# Patient Record
Sex: Female | Born: 1970 | Race: White | Hispanic: No | Marital: Single | Smoking: Never smoker
Health system: Southern US, Community
[De-identification: ages and names within clinical notes are randomized; demographics above are authoritative.]

## PROBLEM LIST (undated history)

## (undated) DIAGNOSIS — K635 Polyp of colon: Secondary | ICD-10-CM

## (undated) DIAGNOSIS — K5792 Diverticulitis of intestine, part unspecified, without perforation or abscess without bleeding: Secondary | ICD-10-CM

## (undated) DIAGNOSIS — K219 Gastro-esophageal reflux disease without esophagitis: Secondary | ICD-10-CM

## (undated) DIAGNOSIS — F419 Anxiety disorder, unspecified: Secondary | ICD-10-CM

## (undated) DIAGNOSIS — K824 Cholesterolosis of gallbladder: Secondary | ICD-10-CM

## (undated) DIAGNOSIS — T7840XA Allergy, unspecified, initial encounter: Secondary | ICD-10-CM

## (undated) DIAGNOSIS — F329 Major depressive disorder, single episode, unspecified: Secondary | ICD-10-CM

## (undated) DIAGNOSIS — D126 Benign neoplasm of colon, unspecified: Secondary | ICD-10-CM

## (undated) DIAGNOSIS — K50019 Crohn's disease of small intestine with unspecified complications: Secondary | ICD-10-CM

## (undated) DIAGNOSIS — K56609 Unspecified intestinal obstruction, unspecified as to partial versus complete obstruction: Secondary | ICD-10-CM

## (undated) DIAGNOSIS — D3501 Benign neoplasm of right adrenal gland: Secondary | ICD-10-CM

## (undated) DIAGNOSIS — F32A Depression, unspecified: Secondary | ICD-10-CM

## (undated) HISTORY — DX: Cholesterolosis of gallbladder: K82.4

## (undated) HISTORY — DX: Unspecified intestinal obstruction, unspecified as to partial versus complete obstruction: K56.609

## (undated) HISTORY — PX: COLONOSCOPY: SHX174

## (undated) HISTORY — DX: Benign neoplasm of colon, unspecified: D12.6

## (undated) HISTORY — DX: Anxiety disorder, unspecified: F41.9

## (undated) HISTORY — DX: Allergy, unspecified, initial encounter: T78.40XA

## (undated) HISTORY — PX: OTHER SURGICAL HISTORY: SHX169

## (undated) HISTORY — DX: Diverticulitis of intestine, part unspecified, without perforation or abscess without bleeding: K57.92

## (undated) HISTORY — DX: Gastro-esophageal reflux disease without esophagitis: K21.9

## (undated) HISTORY — DX: Benign neoplasm of right adrenal gland: D35.01

## (undated) HISTORY — DX: Major depressive disorder, single episode, unspecified: F32.9

## (undated) HISTORY — DX: Polyp of colon: K63.5

## (undated) HISTORY — PX: POLYPECTOMY: SHX149

## (undated) HISTORY — DX: Depression, unspecified: F32.A

---

## 2002-10-08 ENCOUNTER — Emergency Department (HOSPITAL_COMMUNITY): Admission: EM | Admit: 2002-10-08 | Discharge: 2002-10-08 | Payer: Self-pay | Admitting: Emergency Medicine

## 2007-03-09 ENCOUNTER — Emergency Department (HOSPITAL_COMMUNITY): Admission: EM | Admit: 2007-03-09 | Discharge: 2007-03-09 | Payer: Self-pay | Admitting: Emergency Medicine

## 2009-09-13 ENCOUNTER — Emergency Department (HOSPITAL_COMMUNITY): Admission: EM | Admit: 2009-09-13 | Discharge: 2009-09-13 | Payer: Self-pay | Admitting: Emergency Medicine

## 2009-09-13 ENCOUNTER — Emergency Department (HOSPITAL_COMMUNITY): Admission: EM | Admit: 2009-09-13 | Discharge: 2009-09-13 | Payer: Self-pay | Admitting: Family Medicine

## 2011-01-23 ENCOUNTER — Inpatient Hospital Stay (HOSPITAL_COMMUNITY)
Admission: EM | Admit: 2011-01-23 | Discharge: 2011-01-25 | DRG: 390 | Disposition: A | Discharge status: Home / Self Care | Payer: Self-pay | Attending: General Surgery | Admitting: General Surgery

## 2011-01-23 ENCOUNTER — Emergency Department (HOSPITAL_COMMUNITY): Payer: Self-pay

## 2011-01-23 DIAGNOSIS — E876 Hypokalemia: Secondary | ICD-10-CM | POA: Diagnosis present

## 2011-01-23 DIAGNOSIS — K56609 Unspecified intestinal obstruction, unspecified as to partial versus complete obstruction: Principal | ICD-10-CM | POA: Diagnosis present

## 2011-01-23 LAB — COMPREHENSIVE METABOLIC PANEL
ALT: 41 U/L — ABNORMAL HIGH (ref 0–35)
AST: 82 U/L — ABNORMAL HIGH (ref 0–37)
Chloride: 107 mEq/L (ref 96–112)
GFR calc Af Amer: >60 mL/min (ref >60)
GFR calc non Af Amer: >60 mL/min (ref >60)
Glucose, Bld: 109 mg/dL — ABNORMAL HIGH (ref 70–99)
Total Bilirubin: 0.6 mg/dL (ref 0.3–1.2)
Total Protein: 7.7 g/dL (ref 6.0–8.3)

## 2011-01-23 LAB — CBC
HCT: 39.4 % (ref 36.0–46.0)
Hemoglobin: 13 g/dL (ref 12.0–15.0)
MCH: 28.8 pg (ref 26.0–34.0)
MCHC: 33 g/dL (ref 30.0–36.0)
MCV: 87.4 fL (ref 78.0–100.0)

## 2011-01-23 LAB — URINE MICROSCOPIC-ADD ON

## 2011-01-23 LAB — DIFFERENTIAL
Basophils Relative: 0 % (ref 0–1)
Lymphs Abs: 1.4 10*3/uL (ref 0.7–4.0)
Monocytes Absolute: 0.7 10*3/uL (ref 0.1–1.0)
Monocytes Relative: 5 % (ref 3–12)
Neutrophils Relative %: 84 % — ABNORMAL HIGH (ref 43–77)

## 2011-01-23 LAB — LIPASE, BLOOD: Lipase: 29 U/L (ref 11–59)

## 2011-01-23 LAB — URINALYSIS, ROUTINE W REFLEX MICROSCOPIC
Bilirubin Urine: NEGATIVE
Specific Gravity, Urine: 1.034 — ABNORMAL HIGH (ref 1.005–1.030)
Urobilinogen, UA: 1 mg/dL (ref 0.0–1.0)

## 2011-01-23 MED ORDER — IOHEXOL 300 MG/ML  SOLN
100.0000 mL | Freq: Once | INTRAMUSCULAR | Status: AC | PRN
  Administered 2011-01-23: 80 mL via INTRAVENOUS

## 2011-01-24 ENCOUNTER — Inpatient Hospital Stay (HOSPITAL_COMMUNITY): Payer: Self-pay

## 2011-01-24 LAB — COMPREHENSIVE METABOLIC PANEL
ALT: 35 U/L (ref 0–35)
AST: 50 U/L — ABNORMAL HIGH (ref 0–37)
BUN: 1 mg/dL — ABNORMAL LOW (ref 6–23)
Calcium: 8.9 mg/dL (ref 8.4–10.5)
Chloride: 106 mEq/L (ref 96–112)
Creatinine, Ser: 0.74 mg/dL (ref 0.4–1.2)
GFR calc Af Amer: >60 mL/min (ref >60)
Total Protein: 6.8 g/dL (ref 6.0–8.3)

## 2011-01-24 LAB — CBC
MCH: 29.3 pg (ref 26.0–34.0)
RDW: 13.2 % (ref 11.5–15.5)
WBC: 8.4 10*3/uL (ref 4.0–10.5)

## 2011-01-25 LAB — BASIC METABOLIC PANEL
BUN: <1 mg/dL — ABNORMAL LOW (ref 6–23)
CO2: 25 mEq/L (ref 19–32)
Calcium: 8.7 mg/dL (ref 8.4–10.5)
Chloride: 106 mEq/L (ref 96–112)
Creatinine, Ser: 0.67 mg/dL (ref 0.4–1.2)
Potassium: 4.1 mEq/L (ref 3.5–5.1)
Sodium: 138 mEq/L (ref 135–145)

## 2011-02-06 NOTE — H&P (Signed)
Meghan Charles, Meghan Charles             ACCOUNT NO.:  [id number]  MEDICAL RECORD NO.:  [id number]           PATIENT TYPE:  E  LOCATION:  [facility]                         FACILITY:  [facility]  PHYSICIAN:  Leighton Ruff. Redmond Pulling, MD     DATE OF BIRTH:  1971-06-09  DATE OF ADMISSION:  01/23/2011 DATE OF DISCHARGE:                             HISTORY & PHYSICAL   CHIEF COMPLAINT:  Abdominal pain, nausea, and vomiting.  PRIMARY CARE PROVIDER:  None.  BRIEF HISTORY:  The patient is a 39 year old white female who was in her normal state of health until last Sunday when she had the onset of about 5 hours of nausea and vomiting.  It lasted all day.  She had a fever up to 102-103 range at that time.  She had abdominal pain up through Monday, but then her symptoms got better.  She did well up until this last Saturday, January 22, 2011.  She got sick again with some abdominal pain.  Her symptoms got better and she was good until yesterday, Sunday, January 23, 2011, at which time, she developed recurrent nausea and vomiting, abdominal pain, and ultimately came to the ER around 2 a.m. She said she had her last bowel movement yesterday.  She had some diarrhea with it.  She reports prior to that last week, her bowel movements were kind of long and stringy, not her normal consistency.  In the ER, she had a blood pressure of 159/100, heart rate was 102, temperature was 98, respiratory rate was 20, sats were 97% on room air at 2 a.m.  She subsequently had been treated with analgesia and antiemetics.  Currently, she is no longer having any vomiting or abdominal pain, is relatively improved until you do an exam. Workup in the ER includes a white count of 13.9, hemoglobin 31, hematocrit 39, platelets 347,000.  UA was normal.  Sodium is 142, potassium is 3.6, chloride is 107, CO2 is 24, BUN is 12, creatinine is 0.7, glucose 109.  SGOT 82, SGPT is 41, total bilirubin is 0.6, alk phos 57, lipase 29.  Urine pregnancy was  negative.  Abdominal ultrasound was normal except for a small gallbladder polyp.  CT of the abdomen and the pelvis shows partial small bowel obstruction with edematous mucosa of a loop of bowel in the midabdomen.  There is edematous mucosa with dilatation of the proximal small bowel.  There is a small amount of free fluid in the pelvis.  The liver, spleen, pancreas, and kidneys were normal.  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 There was no free air, no adenopathy.  We were called to see the patient and plan to admit her for a partial small bowel obstruction.  PAST MEDICAL HISTORY:  She had a kidney stone in November 2010.  No other medical problems.  PAST SURGICAL HISTORY:  None.  FAMILY HISTORY:  Mother is living in good health.  Father lives in [place], she does not know him well, he has a history of MI and alcohol use.  One step-brother who she thinks is in good health, no sisters.  SOCIAL HISTORY:  She is single.  She works as a Chief Operating Officer.  She has multiple places were she works.  Tobacco:  Never.  Alcohol:  About 4 drinks per week.  Drugs:  None.  REVIEW OF SYSTEMS:  CONSTITUTIONAL:  Fever positive last Sunday when this all started.  She had temperature, she said, that went up to 102- 103, none yesterday that she is aware of.  Weight is stable. CEREBROVASCULAR:  Negative.  PULMONARY:  Negative.  CARDIAC:  Negative. GI:  Positive for occasional GERD, nausea and vomiting, diarrhea, thin stools as described above.  Prior to last week, she had no problem with nausea, vomiting, diarrhea, constipation, or blood in her stool.  SKIN: No changes.  PSYCHIATRIC:  History of prior depression.  She was treated with Lexapro in the past, but is currently off.  LOWER EXTREMITIES:  No edema.  No claudication.  MUSCULOSKELETAL:  No problems with ambulation. No gait issues.  No joint issues.  MEDICATIONS:  She is on Depo-Provera injections for birth control.   She takes a multivitamin once daily.  ALLERGIES:  None.  PHYSICAL EXAMINATION:  GENERAL:  She is a well-nourished, well-developed white female, currently in no distress. VITAL SIGNS:  Her temperature currently is 97.9, heart rate is 88, blood pressure is 131/88, respiratory rate is 18, sats 96% on room air. HEENT:  Head, normocephalic.  Ears, nose, and throat within normal limits.  Normal mucosa.  Dentition normal. NECK:  Trachea is in the midline.  Thyroid is nonpalpable.  No bruits. No lymphadenopathy. RESPIRATORY:  Normal respiratory effort.  Clear to auscultation. CARDIAC:  Normal S1 and S2.  No murmurs.  Pulses are +2 and equal proximally and distally. CHEST:  Nontender. ABDOMEN:  She is slightly distended, tender in the midepigastric and lower abdomen palpation.  Bowel sounds are hyperactive.  There are no masses, abscesses, or hernia noted. GENITALIA:  Deferred. RECTAL:  Deferred. LYMPHADENOPATHY:  None palpated, axillary or femoral area. SKIN:  No changes noted. NEUROLOGIC:  Cranial nerves are intact.  No focal deficits. PSYCHIATRIC:  Normal affect, mood, and orientation.  IMPRESSION: 1. Partial small bowel obstruction with nausea, vomiting, abdominal     pain. 2. Questionable gastroenteritis last week.  No prior history of     abdominal problems or surgeries. 3. History of nephrolithiasis.  PLAN: 1. I will insert an NG small bowel rest. 2. IV hydration. 3. We will recheck her labs in a.m. with further workup and     evaluation as needed.    Lydia Guiles, P.A.   ______________________________ Leighton Ruff. Redmond Pulling, MD   WDJ/MEDQ  D:  01/23/2011  T:  01/23/2011  Job:  [id number]  Electronically Signed by Earnstine Regal P.A. on 01/30/2011 01:58:51 PM Electronically Signed by Greer Pickerel M.D. on 02/06/2011 08:29:36 AM

## 2011-03-24 LAB — POCT URINALYSIS DIP (DEVICE)
Glucose, UA: NEGATIVE mg/dL
Ketones, ur: NEGATIVE mg/dL
Nitrite: NEGATIVE
Protein, ur: 100 mg/dL — AB
pH: 6 (ref 5.0–8.0)

## 2011-03-24 LAB — DIFFERENTIAL
Basophils Absolute: 0.1 10*3/uL (ref 0.0–0.1)
Basophils Relative: 1 % (ref 0–1)
Eosinophils Relative: 1 % (ref 0–5)
Lymphocytes Relative: 19 % (ref 12–46)
Monocytes Absolute: 0.8 10*3/uL (ref 0.1–1.0)
Monocytes Relative: 8 % (ref 3–12)
Neutro Abs: 6.3 10*3/uL (ref 1.7–7.7)

## 2011-03-24 LAB — URINALYSIS, ROUTINE W REFLEX MICROSCOPIC

## 2011-03-24 LAB — URINE MICROSCOPIC-ADD ON

## 2011-03-24 LAB — BASIC METABOLIC PANEL
BUN: 16 mg/dL (ref 6–23)
CO2: 27 mEq/L (ref 19–32)
GFR calc non Af Amer: >60 mL/min (ref >60)

## 2011-03-24 LAB — CBC
MCV: 89.5 fL (ref 78.0–100.0)
Platelets: 353 10*3/uL (ref 150–400)
RDW: 13.5 % (ref 11.5–15.5)
WBC: 9 10*3/uL (ref 4.0–10.5)

## 2011-03-24 LAB — POCT PREGNANCY, URINE: Preg Test, Ur: NEGATIVE

## 2011-08-21 ENCOUNTER — Emergency Department (HOSPITAL_COMMUNITY): Payer: Self-pay

## 2011-08-21 ENCOUNTER — Encounter (HOSPITAL_COMMUNITY): Payer: Self-pay

## 2011-08-21 ENCOUNTER — Emergency Department (HOSPITAL_COMMUNITY)
Admission: EM | Admit: 2011-08-21 | Discharge: 2011-08-21 | Disposition: A | Discharge status: Home / Self Care | Payer: Self-pay | Attending: Emergency Medicine | Admitting: Emergency Medicine

## 2011-08-21 DIAGNOSIS — S92009A Unspecified fracture of unspecified calcaneus, initial encounter for closed fracture: Secondary | ICD-10-CM | POA: Insufficient documentation

## 2011-08-21 DIAGNOSIS — Y990 Civilian activity done for income or pay: Secondary | ICD-10-CM | POA: Insufficient documentation

## 2011-08-21 DIAGNOSIS — Y9269 Other specified industrial and construction area as the place of occurrence of the external cause: Secondary | ICD-10-CM | POA: Insufficient documentation

## 2011-08-21 DIAGNOSIS — M25579 Pain in unspecified ankle and joints of unspecified foot: Secondary | ICD-10-CM | POA: Insufficient documentation

## 2011-08-21 DIAGNOSIS — M7989 Other specified soft tissue disorders: Secondary | ICD-10-CM | POA: Insufficient documentation

## 2011-08-21 DIAGNOSIS — W010XXA Fall on same level from slipping, tripping and stumbling without subsequent striking against object, initial encounter: Secondary | ICD-10-CM | POA: Insufficient documentation

## 2011-08-22 NOTE — Consult Note (Signed)
  NAMEOTTILIA, PIPPENGER NO.:  [id number]  MEDICAL RECORD NO.:  [id number]  LOCATION:  [facility]                         FACILITY:  [facility]  PHYSICIAN:  Pietro Cassis. Alvan Dame, M.D.  DATE OF BIRTH:  Jul 27, 1971  DATE OF CONSULTATION:  08/21/2011 DATE OF DISCHARGE:  08/21/2011                                CONSULTATION   CHIEF COMPLAINT:  Right heel injury.  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  She had immediate onset of pain.  She placed it in ice initially and was subsequently brought to emergency room for evaluation.  Radiographs revealed a calcaneus fracture.  We were consulted.  A CT scan was ordered.  She reports pain mainly over the anterior dorsolateral aspect of the ankle with throbbing pain somewhat relieved with IV medications at this point.  No other injuries to report.  PAST MEDICAL HISTORY:  History of kidney stone, otherwise healthy.  CURRENT MEDICATIONS:  None.  ALLERGIES:  No known drug allergies.  SURGICAL HISTORY:  None pertinent.  SOCIAL HISTORY:  She denies smoking and drug use.  She does occasionally drink.  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  She does not have a primary care physician.  Apparently she lives in a third story apartment.  PHYSICAL EXAMINATION:  She was seen and evaluated in the emergency room. She was afebrile with stable vital signs, relatively comfortable at the time of evaluation.  She had already some bruising and swelling over the lateral aspect of the foot and ankle region.  She had palpable pulses. Intact sensorially.  She had no other obvious deformities.  No other upper extremity or lower extremity pathology.  RADIOGRAPHY:  Plain films of the right foot and ankle revealed comminuted calcaneus fracture, small avulsion off the distal fibula. CT scan was ordered confirming a comminuted calcaneus fracture with fracture  extending into the subtalar joint as well as the calcaneocuboid joints.  ASSESSMENT:  Right closed calcaneus fracture.  PLAN:  I reviewed with Jamee her current situation.  She is going to be placed in a posterior splint; will be nonweightbearing until further directed.  She will also be given a Cam walker boot through the emergency room.  I am going to review these films with Dr. Wylene Simmer and/or Dr. Altamese Loyal for surgical management versus nonsurgical management.  We did take down her cell number [phone] to contact her regarding followup, either with our office or with Dr. Marcelino Scot.  Again, we stressed nonweightbearing.  She was given Percocet through the emergency room as well as Toradol.  Questions were encouraged, answers reviewed with her.     Pietro Cassis Alvan Dame, M.D.     MDO/MEDQ  D:  08/21/2011  T:  08/21/2011  Job:  [id number]  Electronically Signed by Paralee Cancel M.D. on 08/22/2011 09:05:20 AM

## 2011-08-29 ENCOUNTER — Ambulatory Visit (HOSPITAL_COMMUNITY): Payer: Self-pay

## 2011-08-29 ENCOUNTER — Ambulatory Visit (HOSPITAL_BASED_OUTPATIENT_CLINIC_OR_DEPARTMENT_OTHER)
Admission: RE | Admit: 2011-08-29 | Discharge: 2011-08-30 | Disposition: A | Discharge status: Home / Self Care | Payer: Self-pay | Source: Ambulatory Visit | Attending: Orthopedic Surgery | Admitting: Orthopedic Surgery

## 2011-08-29 ENCOUNTER — Ambulatory Visit (HOSPITAL_COMMUNITY): Payer: Self-pay | Attending: Orthopedic Surgery

## 2011-08-29 DIAGNOSIS — X58XXXA Exposure to other specified factors, initial encounter: Secondary | ICD-10-CM | POA: Insufficient documentation

## 2011-08-29 DIAGNOSIS — Z01812 Encounter for preprocedural laboratory examination: Secondary | ICD-10-CM | POA: Insufficient documentation

## 2011-08-29 DIAGNOSIS — S92009A Unspecified fracture of unspecified calcaneus, initial encounter for closed fracture: Secondary | ICD-10-CM | POA: Insufficient documentation

## 2011-08-29 DIAGNOSIS — Y929 Unspecified place or not applicable: Secondary | ICD-10-CM | POA: Insufficient documentation

## 2011-08-29 LAB — POCT HEMOGLOBIN-HEMACUE: Hemoglobin: 12.5 g/dL (ref 12.0–15.0)

## 2011-08-31 NOTE — Op Note (Addendum)
NAMEJACQUITA, MULHEARN NO.:  [id number]  MEDICAL RECORD NO.:  [id number]  LOCATION:  [facility]                         FACILITY:  [facility]  PHYSICIAN:  Wylene Simmer, MD        DATE OF BIRTH:  1971/05/15  DATE OF PROCEDURE:  08/29/2011 DATE OF DISCHARGE:  08/21/2011                              OPERATIVE REPORT   PREOPERATIVE DIAGNOSIS:  Right calcaneus fracture.  POSTOPERATIVE DIAGNOSIS:  Right calcaneus fracture.  PROCEDURE: 1. Open reduction and internal fixation right calcaneus fracture. 2. Intraoperative interpretation of fluoroscopic imaging greater than     1 hour.  SURGEON:  Wylene Simmer, MD  ANESTHESIA:  General, regional.  IV FLUIDS:  See anesthesia record.  ESTIMATED BLOOD LOSS:  Minimal.  TOURNIQUET TIME:  1 hour and 45 minutes at 250 mmHg.  COMPLICATIONS:  None apparent.  DISPOSITION:  Extubated, awake and stable to recovery.  INDICATIONS FOR PROCEDURE:  The patient is a 40 year old female who injured her foot at work approximately 10 days ago.  This occurred when she jumped down from a bar landing hard on the floor.  A CT scan in the emergency department revealed a comminuted fracture of the calcaneus that was displaced.  She presents now for operative treatment of this injury.  She understands the risks and benefits of this procedure as well as the alternative treatment options.  Specifically, she understands the risks of bleeding, infection, nerve damage, blood clots, need for additional surgery, amputation and death.  PROCEDURE IN DETAIL:  After preoperative consent was obtained, the correct operative site was identified, the patient was brought to the operating room and placed supine on the operating table.  General anesthesia was induced.  Preoperative antibiotics were administered. Surgical time-out was taken.  The patient was then turned into the lateral decubitus position with the right side up.  The right lower extremity was then  prepped and draped in standard sterile fashion with tourniquet around the thigh.  A curvilinear incision was marked from the tip of the fibula to the base of the fourth metatarsal.  The extremity was exsanguinated and the tourniquet was inflated to 250 mmHg.  The curvilinear incision was made and sharp dissection was carried down through the skin.  Blunt dissection was carried down through the subcutaneous tissue to the peroneal tendon sheath.  The sheath was incised, peroneus longus and brevis tendons were retracted distally. The extensor digitorum brevis muscle was elevated exposing the anterior process of the calcaneus.  The sinus tarsi was dissected subperiosteally of all of its fat exposing the posterior facet.  The fracture line was immediately evident from the anterior portion of the calcaneus running longitudinally into the posterior facet.  The fracture was mobilized and irrigated of all hematoma and all nonviable small fracture fragments. There were several fragments anteriorly at the level of the calcaneocuboid joint.  The fracture was reduced at the posterior facet. A 0.0625 K-wire was inserted across the fracture line provisionally holding the reduction.  A 4-mm Schanz pin had been inserted into the calcaneal tuberosity percutaneously in order to help manipulate the tuberosity fragment.  A 3.5-mm fully-threaded lag screw was then inserted just below the subchondral bone of  the posterior facet.  This was noted to compress the fracture line appropriately.  An Acumed plate was then selected and placed adjacent to the posterior facet and onto the anterior process of the calcaneus.  It was provisionally pinned in place.  Lateral foot in Chilton views were obtained confirming appropriate position of the plate and appropriate reduction of the posterior facet fracture line.  The angle of Gissane was normal.  The Bohler angle was also normal.  The plate was then secured to the  bone with two bicortical screws just below the posterior facet and three screws at the anterior process of the calcaneus.  The most anterior and superior of the screws and plate was a unicortical locking screw.  The remainder were nonlocking bicortical screws.  Final AP foot, lateral foot Broden views and Harris heel views were obtained showing appropriate reduction of the fracture as well as appropriate position and length of all hardware.  The anterior process fragments were reduced and clamped with a tenaculum.  A percutaneous incision was made and a hole was drilled through the anterior process into the plantar surface of the calcaneus.  A 3.5-mm fully-threaded screw was inserted in percutaneous fashion down through this fragment.  The fragment, however was extremely comminuted and the screw would not gain purchase.  Screw was removed.  An attempt was made at fixation with suture and this also proved unsuccessful.  The fragments were quite small and comminuted, so these were excised and used as bone graft in the anterior process fracture line.  The wound was then irrigated copiously.  0 Vicryl inverted simple sutures were used to close the peroneal tendon sheath and the subcutaneous tissue over the sinus tarsi.  The skin was closed with a running suture of 3-0 Prolene and 2 stab incisions were also closed with simple sutures of 3-0 Prolene.  Sterile dressings were applied followed by well-padded short-leg cast.  Tourniquet was released at 1 hour and 45 minutes after application of the dressings.  The patient was then awakened from anesthesia and transported to recovery room in stable condition.  FOLLOWUP PLAN:  The patient will be nonweightbearing on the right lower extremity.  She will remain overnight for observation for pain control. She will follow up with me in 2 weeks for suture removal and application of a short-leg cast.     Wylene Simmer,  MD   ______________________________ Wylene Simmer, MD    JH/MEDQ  D:  08/29/2011  T:  08/30/2011  Job:  [id number]  Electronically Signed by Jenny Reichmann Makiyah Zentz  on 09/14/2011 12:11:44 PM

## 2011-11-07 ENCOUNTER — Other Ambulatory Visit (HOSPITAL_COMMUNITY): Payer: Self-pay | Admitting: Obstetrics & Gynecology

## 2011-11-07 DIAGNOSIS — Z1231 Encounter for screening mammogram for malignant neoplasm of breast: Secondary | ICD-10-CM

## 2011-12-07 ENCOUNTER — Ambulatory Visit (HOSPITAL_COMMUNITY): Payer: Self-pay | Attending: Obstetrics & Gynecology

## 2012-11-04 ENCOUNTER — Emergency Department (HOSPITAL_COMMUNITY): Payer: Self-pay

## 2012-11-04 ENCOUNTER — Encounter (HOSPITAL_COMMUNITY): Payer: Self-pay | Admitting: *Deleted

## 2012-11-04 ENCOUNTER — Inpatient Hospital Stay (HOSPITAL_COMMUNITY)
Admission: EM | Admit: 2012-11-04 | Discharge: 2012-11-06 | DRG: 392 | Disposition: A | Discharge status: Home / Self Care | Payer: MEDICAID | Attending: Internal Medicine | Admitting: Internal Medicine

## 2012-11-04 DIAGNOSIS — D500 Iron deficiency anemia secondary to blood loss (chronic): Secondary | ICD-10-CM

## 2012-11-04 DIAGNOSIS — R1110 Vomiting, unspecified: Secondary | ICD-10-CM

## 2012-11-04 DIAGNOSIS — D72829 Elevated white blood cell count, unspecified: Secondary | ICD-10-CM | POA: Diagnosis not present

## 2012-11-04 DIAGNOSIS — R110 Nausea: Secondary | ICD-10-CM

## 2012-11-04 DIAGNOSIS — D62 Acute posthemorrhagic anemia: Secondary | ICD-10-CM | POA: Diagnosis present

## 2012-11-04 DIAGNOSIS — R109 Unspecified abdominal pain: Secondary | ICD-10-CM

## 2012-11-04 DIAGNOSIS — K5090 Crohn's disease, unspecified, without complications: Secondary | ICD-10-CM | POA: Diagnosis present

## 2012-11-04 DIAGNOSIS — K56609 Unspecified intestinal obstruction, unspecified as to partial versus complete obstruction: Secondary | ICD-10-CM

## 2012-11-04 DIAGNOSIS — K5289 Other specified noninfective gastroenteritis and colitis: Principal | ICD-10-CM

## 2012-11-04 DIAGNOSIS — K529 Noninfective gastroenteritis and colitis, unspecified: Secondary | ICD-10-CM

## 2012-11-04 DIAGNOSIS — R112 Nausea with vomiting, unspecified: Secondary | ICD-10-CM

## 2012-11-04 LAB — URINE MICROSCOPIC-ADD ON

## 2012-11-04 LAB — COMPREHENSIVE METABOLIC PANEL
AST: 16 U/L (ref 0–37)
Albumin: 3.9 g/dL (ref 3.5–5.2)
Calcium: 9 mg/dL (ref 8.4–10.5)
Chloride: 101 mEq/L (ref 96–112)
Creatinine, Ser: 0.61 mg/dL (ref 0.50–1.10)
Total Bilirubin: 0.3 mg/dL (ref 0.3–1.2)
Total Protein: 7.3 g/dL (ref 6.0–8.3)

## 2012-11-04 LAB — CBC WITH DIFFERENTIAL/PLATELET
Basophils Absolute: 0 10*3/uL (ref 0.0–0.1)
Basophils Relative: 0 % (ref 0–1)
Eosinophils Absolute: 0 10*3/uL (ref 0.0–0.7)
HCT: 35.6 % — ABNORMAL LOW (ref 36.0–46.0)
MCH: 28.2 pg (ref 26.0–34.0)
MCHC: 33.7 g/dL (ref 30.0–36.0)
Monocytes Absolute: 0.2 10*3/uL (ref 0.1–1.0)
Neutro Abs: 12.2 10*3/uL — ABNORMAL HIGH (ref 1.7–7.7)
RDW: 13.6 % (ref 11.5–15.5)

## 2012-11-04 LAB — URINALYSIS, ROUTINE W REFLEX MICROSCOPIC
Glucose, UA: NEGATIVE mg/dL
Leukocytes, UA: NEGATIVE
Nitrite: NEGATIVE
Specific Gravity, Urine: 1.038 — ABNORMAL HIGH (ref 1.005–1.030)
pH: 6 (ref 5.0–8.0)

## 2012-11-04 LAB — PREGNANCY, URINE: Preg Test, Ur: NEGATIVE

## 2012-11-04 LAB — LIPASE, BLOOD: Lipase: 32 U/L (ref 11–59)

## 2012-11-04 MED ORDER — ONDANSETRON HCL 4 MG/2ML IJ SOLN
4.0000 mg | Freq: Once | INTRAMUSCULAR | Status: AC
  Administered 2012-11-04: 4 mg via INTRAVENOUS
  Filled 2012-11-04: qty 2

## 2012-11-04 MED ORDER — DIPHENHYDRAMINE HCL 50 MG/ML IJ SOLN
25.0000 mg | Freq: Once | INTRAMUSCULAR | Status: AC
  Administered 2012-11-04: 25 mg via INTRAVENOUS
  Filled 2012-11-04: qty 1

## 2012-11-04 MED ORDER — IOHEXOL 300 MG/ML  SOLN
100.0000 mL | Freq: Once | INTRAMUSCULAR | Status: AC | PRN
  Administered 2012-11-04: 100 mL via INTRAVENOUS

## 2012-11-04 MED ORDER — ONDANSETRON HCL 4 MG PO TABS: 4.0000 mg | ORAL_TABLET | Freq: Four times a day (QID) | ORAL | Status: DC | PRN

## 2012-11-04 MED ORDER — ENOXAPARIN SODIUM 40 MG/0.4ML ~~LOC~~ SOLN
40.0000 mg | SUBCUTANEOUS | Status: DC
  Administered 2012-11-04 – 2012-11-06 (×3): 40 mg via SUBCUTANEOUS
  Filled 2012-11-04 (×3): qty 0.4

## 2012-11-04 MED ORDER — ONDANSETRON HCL 4 MG/2ML IJ SOLN
4.0000 mg | Freq: Four times a day (QID) | INTRAMUSCULAR | Status: DC | PRN
  Administered 2012-11-04 – 2012-11-05 (×4): 4 mg via INTRAVENOUS
  Filled 2012-11-04 (×3): qty 2

## 2012-11-04 MED ORDER — ONDANSETRON HCL 4 MG/2ML IJ SOLN
INTRAMUSCULAR | Status: AC
  Filled 2012-11-04: qty 2

## 2012-11-04 MED ORDER — PIPERACILLIN-TAZOBACTAM 3.375 G IVPB
3.3750 g | Freq: Three times a day (TID) | INTRAVENOUS | Status: DC
  Administered 2012-11-04 – 2012-11-05 (×3): 3.375 g via INTRAVENOUS
  Filled 2012-11-04 (×4): qty 50

## 2012-11-04 MED ORDER — ACETAMINOPHEN 650 MG RE SUPP: 650.0000 mg | Freq: Four times a day (QID) | RECTAL | Status: DC | PRN

## 2012-11-04 MED ORDER — HYDROMORPHONE HCL PF 1 MG/ML IJ SOLN
1.0000 mg | Freq: Once | INTRAMUSCULAR | Status: AC
  Administered 2012-11-04: 1 mg via INTRAVENOUS
  Filled 2012-11-04: qty 1

## 2012-11-04 MED ORDER — HYDROMORPHONE HCL PF 1 MG/ML IJ SOLN
INTRAMUSCULAR | Status: AC
  Filled 2012-11-04: qty 1

## 2012-11-04 MED ORDER — HYDROMORPHONE HCL PF 1 MG/ML IJ SOLN
0.5000 mg | INTRAMUSCULAR | Status: DC | PRN
  Administered 2012-11-04 – 2012-11-05 (×5): 0.5 mg via INTRAVENOUS
  Filled 2012-11-04 (×4): qty 1

## 2012-11-04 MED ORDER — SODIUM CHLORIDE 0.9 % IV BOLUS (SEPSIS)
1000.0000 mL | Freq: Once | INTRAVENOUS | Status: AC
  Administered 2012-11-04: 1000 mL via INTRAVENOUS

## 2012-11-04 MED ORDER — SODIUM CHLORIDE 0.9 % IV SOLN
INTRAVENOUS | Status: DC
  Administered 2012-11-04 – 2012-11-05 (×4): via INTRAVENOUS

## 2012-11-04 MED ORDER — ACETAMINOPHEN 325 MG PO TABS
650.0000 mg | ORAL_TABLET | Freq: Four times a day (QID) | ORAL | Status: DC | PRN
  Administered 2012-11-05: 650 mg via ORAL
  Filled 2012-11-04: qty 2

## 2012-11-04 MED ORDER — MORPHINE SULFATE 4 MG/ML IJ SOLN
6.0000 mg | Freq: Once | INTRAMUSCULAR | Status: AC
  Administered 2012-11-04: 6 mg via INTRAVENOUS
  Filled 2012-11-04: qty 2

## 2012-11-04 MED ORDER — METOCLOPRAMIDE HCL 5 MG/ML IJ SOLN
10.0000 mg | Freq: Once | INTRAMUSCULAR | Status: AC
  Administered 2012-11-04: 10 mg via INTRAVENOUS
  Filled 2012-11-04: qty 2

## 2012-11-04 NOTE — H&P (Signed)
[facility] History and Physical  Meghan Charles CHE:[id number] DOB: [birth year] DOA: 11/04/2012  Referring physician:  PCP: No primary provider on file.  Specialists: none  Chief Complaint: abdomina pain.  HPI: Meghan Charles is a 41 y.o. female  With h/o prior SBO three years ago comes in for abdominal pain, nausea and vomiting since 3 days . On arrival to ED she underwent a CT abd and pelvis was found to have enteritis and partial SBO. She was put NPO and surgery called. Surgery recommended medicine admission for observation and antibiotics. GI consult called recommended observationand possible colonoscopy after  sbo is resolved.    Review of Systems: The patient denies anorexia, fever, weight loss,, vision loss, decreased hearing, hoarseness, chest pain, syncope, dyspnea on exertion, peripheral edema, balance deficits, hemoptysis, abdominal pain, melena, hematochezia, severe indigestion/heartburn, hematuria, incontinence, genital sores, muscle weakness, suspicious skin lesions, transient blindness, difficulty walking, depression, unusual weight change, abnormal bleeding, enlarged lymph nodes, angioedema, and breast masses.    History reviewed. No pertinent past medical history. Past Surgical History  Procedure Date  . Other surgical history     heel surgery   Social History:  reports that she has never smoked. She does not have any smokeless tobacco history on file. She reports that she does not drink alcohol or use illicit drugs.  No Known Allergies  No family history on file.  Prior to Admission medications   Medication Sig Start Date End Date Taking? Authorizing Provider  Digestive Aids Mixture (PAPAYA ENZYMES PO) Take 1 tablet by mouth daily.   Yes Historical Provider, MD  Lactobacillus (PROBIOTIC ACIDOPHILUS PO) Take 1 tablet by mouth daily.   Yes Historical Provider, MD   Physical Exam: Filed Vitals:   11/04/12 0018 11/04/12 0329  BP: 152/109 134/88  Pulse: 110  97  Temp: 98 F (36.7 C) 97.9 F (36.6 C)  TempSrc:  Oral  Resp: 20 20  SpO2: 100% 100%    Constitutional: Vital signs reviewed.  Patient is a well-developed and well-nourished  in no acute distress and cooperative with exam. Alert and oriented x3.  Head: Normocephalic and atraumatic Ear: TM normal bilaterally Mouth: no erythema or exudates, MMM Eyes: PERRL, EOMI, conjunctivae normal, No scleral icterus.  Neck: Supple, Trachea midline normal ROM, No JVD, mass, thyromegaly, or carotid bruit present.  Cardiovascular: RRR, S1 normal, S2 normal, no MRG, pulses symmetric and intact bilaterally Pulmonary/Chest: CTAB, no wheezes, rales, or rhonchi Abdominal: Soft. Mild tenderness int he LLQ non-distended, bowel sounds are SLUGGISH, no masses, organomegaly, or guarding present.  GU: no CVA tenderness Musculoskeletal: No joint deformities, erythema, or stiffness, ROM full and no nontender Hematology: no cervical, inginal, or axillary adenopathy.  Neurological: A&O x3, Strength is normal and symmetric bilaterally, cranial nerve II-XII are grossly intact, no focal motor deficit, sensory intact to light touch bilaterally.  Skin: Warm, dry and intact. No rash, cyanosis, or clubbing.  Psychiatric: Normal mood and affect. speech and behavior is normal. Judgment and thought content normal. Cognition and memory are normal.     Labs on Admission:  Basic Metabolic Panel:  Lab 53/61/44 0352  NA 137  K 3.7  CL 101  CO2 23  GLUCOSE 113*  BUN 12  CREATININE 0.61  CALCIUM 9.0  MG --  PHOS --   Liver Function Tests:  Lab 11/04/12 0352  AST 16  ALT 13  ALKPHOS 57  BILITOT 0.3  PROT 7.3  ALBUMIN 3.9    Lab 11/04/12 0352  LIPASE 32  AMYLASE --   No results found for this basename: AMMONIA:5 in the last 168 hours CBC:  Lab 11/04/12 0352  WBC 13.0*  NEUTROABS 12.2*  HGB 12.0  HCT 35.6*  MCV 83.8  PLT 344   Cardiac Enzymes: No results found for this basename:  CKTOTAL:5,CKMB:5,CKMBINDEX:5,TROPONINI:5 in the last 168 hours  BNP (last 3 results) No results found for this basename: PROBNP:3 in the last 8760 hours CBG: No results found for this basename: GLUCAP:5 in the last 168 hours  Radiological Exams on Admission: Ct Abdomen Pelvis W Contrast  11/04/2012  *RADIOLOGY REPORT*  Clinical Data: Left lower quadrant abdominal pain  CT ABDOMEN AND PELVIS WITH CONTRAST  Technique:  Multidetector CT imaging of the abdomen and pelvis was performed following the standard protocol during bolus administration of intravenous contrast.  Contrast: 153m OMNIPAQUE IOHEXOL 300 MG/ML  SOLN  Comparison: 11/04/2012 radiograph, 01/23/2011 CT.  09/13/2009 unenhanced CT.  Findings: Lung bases are clear.  Heart size within normal limits. Breast prostheses. No pleural or pericardial effusion.  Unremarkable liver, biliary system, spleen, pancreas, left adrenal gland.  2.7 cm right adrenal nodule is incompletely characterized however grossly similar to prior. Favored to represent an adenoma when corresponded to the 2010 unenhanced CT.  A couple tiny renal hypodensities, too small further characterize. Otherwise, symmetric renal enhancement.  No hydronephrosis or hydroureter.  The colon is relatively decompressed.  Normal appendix. Proximal most and distal small bowel loops are decompressed.  However, there are dilated mid small bowel loops with air-fluid levels, measuring up to 3.4 cm.  There is a transition point in the pelvis where a focal circumferentially thickened segment is seen on series 2 image 71. Small amount of free fluid within the pelvis dependently.  No free intraperitoneal air.  No lymphadenopathy.  Normal caliber aorta and branch vessels.  Thin-walled bladder.  Unremarkable CT appearance to the uterus and adnexa.  No acute osseous finding.  IMPRESSION: There are dilated small bowel loops with transition/delayed transit through a focally thickened segment of small bowel in  the pelvis. This may reflect an enteritis as can be seen with a focal inflammatory, infectious, or ischemic process.  An underlying mass is not excluded. Recommend GI consultation.  Small amount of free fluid within the pelvis is nonspecific.   Original Report Authenticated By: ACarlos Levering M.D.    Dg Abd Acute W/chest  11/04/2012  *RADIOLOGY REPORT*  Clinical Data: Nausea, vomiting, upper abdominal pain for 12 hours  ACUTE ABDOMEN SERIES (ABDOMEN 2 VIEW & CHEST 1 VIEW)  Comparison: Abdominal radiographs 01/24/2011  Findings: Normal heart size and mediastinal contours. Bilateral upper lobe opacities are present with superior retraction of the hila bilaterally suggesting upper lobe scarring. Unable to exclude nodular foci in the left upper lobe and at lateral mid right lung. Remaining lungs hyperaerated but clear. Bones demineralized. Few nonspecific loops of small bowel in mid abdomen, upper normal in size. Few scattered air fluid levels. No definite bowel wall thickening or free intraperitoneal air. No urinary tract calcification.  IMPRESSION: Hyperaerated lungs with volume loss in the upper lobes with superior retraction of the hila likely scarring. Areas of opacity in the left upper lobe and lateral mid right lung are somewhat more nodular appearance and underlying pulmonary nodules not excluded. If patient has prior outside chest radiographs these would be of benefit in establishing stability of these findings. In the absence of prior exams, recommend CT to exclude pulmonary nodules.   Original Report Authenticated By: MLavonia Dana  M.D.       Assessment/Plan Active Problems:    1. Partial SBO:  - admit to med surg - clear liq diet - repeat abd films  in am.   2. Enteritis: - on zosyn - GI CONSULT Called for possible colonoscopy. - anti emetics and IV fluids and pain control.  3. DVT prophylaxis    Code Status: full code Family Communication: none at bedside Disposition Plan:  pending, 1 to 2 days.   Time spent:61mn  Maurio Baize [facility] Pager [phone] If 7PM-7AM, please contact night-coverage [email or web address] Password [email or web address] 11/04/2012, 7:51 AM

## 2012-11-04 NOTE — ED Notes (Signed)
MD at bedside. 

## 2012-11-04 NOTE — ED Provider Notes (Signed)
History     CSN: [id number]  Arrival date & time 11/04/12  0009   First MD Initiated Contact with Patient 11/04/12 0044      Chief Complaint  Patient presents with  . Emesis    (Consider location/radiation/quality/duration/timing/severity/associated sxs/prior treatment) The history is provided by the patient.  Meghan Charles is a 41 y.o. female here with abdominal pain and emesis. Acute onset of lower ab pain since 5pm yesterday. She then had persistent vomiting. Denies fever or chills or urinary symptoms or constipation or diarrhea. She had this previously and was diagnosed with SBO and had an NG placed and was admitted for several days. No previous abdominal surgeries.    History reviewed. No pertinent past medical history.  Past Surgical History  Procedure Date  . Other surgical history     heel surgery    No family history on file.  History  Substance Use Topics  . Smoking status: Never Smoker   . Smokeless tobacco: Not on file  . Alcohol Use: No    OB History    Grav Para Term Preterm Abortions TAB SAB Ect Mult Living                  Review of Systems  Gastrointestinal: Positive for vomiting and abdominal pain.  All other systems reviewed and are negative.     Allergies  Review of patient's allergies indicates no known allergies.  Home Medications   Current Outpatient Rx  Name  Route  Sig  Dispense  Refill  . PAPAYA ENZYMES PO   Oral   Take 1 tablet by mouth daily.         Marland Kitchen PROBIOTIC ACIDOPHILUS PO   Oral   Take 1 tablet by mouth daily.           BP 134/88  Pulse 97  Temp 97.9 F (36.6 C) (Oral)  Resp 20  SpO2 100%  Physical Exam  Nursing note and vitals reviewed. Constitutional: She is oriented to person, place, and time.       Uncomfortable, actively vomiting   HENT:  Head: Normocephalic.  Mouth/Throat: Oropharynx is clear and moist.  Eyes: Conjunctivae normal are normal. Pupils are equal, round, and reactive to light.    Neck: Normal range of motion. Neck supple.  Cardiovascular: Normal rate, regular rhythm and normal heart sounds.   Pulmonary/Chest: Effort normal and breath sounds normal. No respiratory distress. She has no wheezes. She has no rales.  Abdominal: Soft.       + LLQ tenderness, no rebound no CVAT    Musculoskeletal: Normal range of motion.  Neurological: She is alert and oriented to person, place, and time.  Skin: Skin is warm and dry.  Psychiatric: She has a normal mood and affect. Her behavior is normal. Judgment and thought content normal.    ED Course  Procedures (including critical care time)  Labs Reviewed  CBC WITH DIFFERENTIAL - Abnormal; Notable for the following:    WBC 13.0 (*)     HCT 35.6 (*)     Neutrophils Relative 94 (*)     Neutro Abs 12.2 (*)     Lymphocytes Relative 5 (*)     Monocytes Relative 1 (*)     All other components within normal limits  COMPREHENSIVE METABOLIC PANEL - Abnormal; Notable for the following:    Glucose, Bld 113 (*)     All other components within normal limits  URINALYSIS, ROUTINE W REFLEX MICROSCOPIC - Abnormal; Notable  for the following:    APPearance CLOUDY (*)     Specific Gravity, Urine 1.038 (*)     Hgb urine dipstick SMALL (*)     Bilirubin Urine SMALL (*)     Ketones, ur >80 (*)     Protein, ur 100 (*)     All other components within normal limits  URINE MICROSCOPIC-ADD ON - Abnormal; Notable for the following:    Crystals CA OXALATE CRYSTALS (*)     All other components within normal limits  LIPASE, BLOOD  PREGNANCY, URINE   Ct Abdomen Pelvis W Contrast  11/04/2012  *RADIOLOGY REPORT*  Clinical Data: Left lower quadrant abdominal pain  CT ABDOMEN AND PELVIS WITH CONTRAST  Technique:  Multidetector CT imaging of the abdomen and pelvis was performed following the standard protocol during bolus administration of intravenous contrast.  Contrast: OMNIPAQUE IOHEXOL 300 MG/ML  SOLN  Comparison: 11/04/2012 radiograph,  01/23/2011 CT.  09/13/2009 unenhanced CT.  Findings: Lung bases are clear.  Heart size within normal limits. Breast prostheses. No pleural or pericardial effusion.  Unremarkable liver, biliary system, spleen, pancreas, left adrenal gland.  2.7 cm right adrenal nodule is incompletely characterized however grossly similar to prior. Favored to represent an adenoma when corresponded to the 2010 unenhanced CT.  A couple tiny renal hypodensities, too small further characterize. Otherwise, symmetric renal enhancement.  No hydronephrosis or hydroureter.  The colon is relatively decompressed.  Normal appendix. Proximal most and distal small bowel loops are decompressed.  However, there are dilated mid small bowel loops with air-fluid levels, measuring up to 3.4 cm.  There is a transition point in the pelvis where a focal circumferentially thickened segment is seen on series 2 image 71. Small amount of free fluid within the pelvis dependently.  No free intraperitoneal air.  No lymphadenopathy.  Normal caliber aorta and branch vessels.  Thin-walled bladder.  Unremarkable CT appearance to the uterus and adnexa.  No acute osseous finding.  IMPRESSION: There are dilated small bowel loops with transition/delayed transit through a focally thickened segment of small bowel in the pelvis. This may reflect an enteritis as can be seen with a focal inflammatory, infectious, or ischemic process.  An underlying mass is not excluded. Recommend GI consultation.  Small amount of free fluid within the pelvis is nonspecific.   Original Report Authenticated By: Jearld Lesch, M.D.    Dg Abd Acute W/chest  11/04/2012  *RADIOLOGY REPORT*  Clinical Data: Nausea, vomiting, upper abdominal pain for 12 hours  ACUTE ABDOMEN SERIES (ABDOMEN 2 VIEW & CHEST 1 VIEW)  Comparison: Abdominal radiographs 01/24/2011  Findings: Normal heart size and mediastinal contours. Bilateral upper lobe opacities are present with superior retraction of the hila  bilaterally suggesting upper lobe scarring. Unable to exclude nodular foci in the left upper lobe and at lateral mid right lung. Remaining lungs hyperaerated but clear. Bones demineralized. Few nonspecific loops of small bowel in mid abdomen, upper normal in size. Few scattered air fluid levels. No definite bowel wall thickening or free intraperitoneal air. No urinary tract calcification.  IMPRESSION: Hyperaerated lungs with volume loss in the upper lobes with superior retraction of the hila likely scarring. Areas of opacity in the left upper lobe and lateral mid right lung are somewhat more nodular appearance and underlying pulmonary nodules not excluded. If patient has prior outside chest radiographs these would be of benefit in establishing stability of these findings. In the absence of prior exams, recommend CT to exclude pulmonary nodules.  Original Report Authenticated By: Ulyses Southward, M.D.      No diagnosis found.    MDM  Meghan Charles is a 41 y.o. female here with ab pain, vomiting. Will get xray to r/o SBO or gastric outlet obstruction. Will get labs and antiemetics and reassess.   2:00 AM  Xray showed no SBO. Will get CT ab/pel.    6:31 AM CT showed partial SBO. Surgery evaluated, felt its likely enteritis. Recommend GI workup and admission for IVF. Surgery wants to hold off on abx for now. Both surgery and myself counseled the patient on NG tube but patient didn't want it for now and would rather see GI and be observed in the hospital. I discussed with Dr. Julian Reil, who accepted the patient. He will sign out to his team and the AM team will do the admission.       Richardean Canal, MD 11/04/12 [phone]

## 2012-11-04 NOTE — ED Notes (Signed)
Pt c/o nausea/vomiting since 5pm; pain on and off; pt sticking her finger down her throat to make her gag during triage

## 2012-11-04 NOTE — ED Notes (Signed)
Called lab to check on results. Lab states no blood has been sent. Labs to be recollected.

## 2012-11-04 NOTE — ED Notes (Signed)
Patient reports stabbing pain to abdomin that started 11/03/12, she states at @1700  she began vomiting and that after she vomits that the pain increases for a short time period. Patient reports to MD that this has happened before, about 2 weeks ago. Patient also reports that a few years ago there was a questionable stomach obstruction, there was no obstruction at that time. Patient states she delayed coming to the ER because she does not want a "tube" like the last time.

## 2012-11-04 NOTE — ED Notes (Signed)
Attempted to call report to 5E. Artist is in huddle. Requests call back in 10 min.

## 2012-11-04 NOTE — ED Notes (Signed)
Care of pt assumed. Pt resting in bed. Reports abd pain increasing. Denies n/v.

## 2012-11-04 NOTE — Progress Notes (Signed)
[place] over admit: 41 yo F with PSBO, h/o same before, dosent want NG tube this time, imaging is questionable for PSBO vs enteritis, surgery saw, recommended medical admission.

## 2012-11-04 NOTE — Consult Note (Signed)
Reason for Consult:Abnormal CAT scan abdominal pain nausea vomiting Referring Physician: Hospital team  Meghan Charles is an 41 y.o. female.  HPI: Patient with her third attack of abdominal pain nausea and vomiting the first one was managed by the surgeons At the other hospital And no GI consultation wasDone and she was fine for about a year until a mild attack about a month ago and all these attacks seemed to go away on there Own and she cannot say what setsThemOff and she has no previous GI symptoms and no other workupAndher lower bowels have been fine and no fever chills or nightSweats and no previous abdominal surgery and no history of PIDOr endometriosis and she does not do any drugs and has no sick contacts and no other complaints and no GI problems run in the familyHistory reviewed. No pertinent past medical history.  Past Surgical History  Procedure Date  . Other surgical history     heel surgery  . Wisdom teeth extracted   . Right heel surgery     with plates and screws    History reviewed. No pertinent family history.  Social History:  reports that she has never smoked. She has never used smokeless tobacco. She reports that she does not drink alcohol or use illicit drugs.  Allergies: No Known Allergies  Medications: I have reviewed the patient's current medications.  Results for orders placed during the hospital encounter of 11/04/12 (from the past 48 hour(s))  URINALYSIS, ROUTINE W REFLEX MICROSCOPIC     Status: Abnormal   Collection Time   11/04/12  1:02 AM      Component Value Range Comment   Color, Urine YELLOW  YELLOW    APPearance CLOUDY (*) CLEAR    Specific Gravity, Urine 1.038 (*) 1.005 - 1.030    pH 6.0  5.0 - 8.0    Glucose, UA NEGATIVE  NEGATIVE mg/dL    Hgb urine dipstick SMALL (*) NEGATIVE    Bilirubin Urine SMALL (*) NEGATIVE    Ketones, ur >80 (*) NEGATIVE mg/dL    Protein, ur 161 (*) NEGATIVE mg/dL    Urobilinogen, UA 0.2  0.0 - 1.0 mg/dL    Nitrite  NEGATIVE  NEGATIVE    Leukocytes, UA NEGATIVE  NEGATIVE   PREGNANCY, URINE     Status: Normal   Collection Time   11/04/12  1:02 AM      Component Value Range Comment   Preg Test, Ur NEGATIVE  NEGATIVE   URINE MICROSCOPIC-ADD ON     Status: Abnormal   Collection Time   11/04/12  1:02 AM      Component Value Range Comment   Squamous Epithelial / LPF RARE  RARE    WBC, UA 0-2  <3 WBC/hpf    RBC / HPF 3-6  <3 RBC/hpf    Bacteria, UA RARE  RARE    Crystals CA OXALATE CRYSTALS (*) NEGATIVE    Urine-Other MUCOUS PRESENT   LESS THAN 10 mL OF URINE SUBMITTED  CBC WITH DIFFERENTIAL     Status: Abnormal   Collection Time   11/04/12  3:52 AM      Component Value Range Comment   WBC 13.0 (*) 4.0 - 10.5 K/uL    RBC 4.25  3.87 - 5.11 MIL/uL    Hemoglobin 12.0  12.0 - 15.0 g/dL    HCT 09.6 (*) 04.5 - 46.0 %    MCV 83.8  78.0 - 100.0 fL    MCH 28.2  26.0 -  34.0 pg    MCHC 33.7  30.0 - 36.0 g/dL    RDW 40.9  81.1 - 91.4 %    Platelets 344  150 - 400 K/uL    Neutrophils Relative 94 (*) 43 - 77 %    Neutro Abs 12.2 (*) 1.7 - 7.7 K/uL    Lymphocytes Relative 5 (*) 12 - 46 %    Lymphs Abs 0.7  0.7 - 4.0 K/uL    Monocytes Relative 1 (*) 3 - 12 %    Monocytes Absolute 0.2  0.1 - 1.0 K/uL    Eosinophils Relative 0  0 - 5 %    Eosinophils Absolute 0.0  0.0 - 0.7 K/uL    Basophils Relative 0  0 - 1 %    Basophils Absolute 0.0  0.0 - 0.1 K/uL   COMPREHENSIVE METABOLIC PANEL     Status: Abnormal   Collection Time   11/04/12  3:52 AM      Component Value Range Comment   Sodium 137  135 - 145 mEq/L    Potassium 3.7  3.5 - 5.1 mEq/L    Chloride 101  96 - 112 mEq/L    CO2 23  19 - 32 mEq/L    Glucose, Bld 113 (*) 70 - 99 mg/dL    BUN 12  6 - 23 mg/dL    Creatinine, Ser 7.82  0.50 - 1.10 mg/dL    Calcium 9.0  8.4 - 95.6 mg/dL    Total Protein 7.3  6.0 - 8.3 g/dL    Albumin 3.9  3.5 - 5.2 g/dL    AST 16  0 - 37 U/L    ALT 13  0 - 35 U/L    Alkaline Phosphatase 57  39 - 117 U/L    Total  Bilirubin 0.3  0.3 - 1.2 mg/dL    GFR calc non Af Amer >90  >90 mL/min    GFR calc Af Amer >90  >90 mL/min   LIPASE, BLOOD     Status: Normal   Collection Time   11/04/12  3:52 AM      Component Value Range Comment   Lipase 32  11 - 59 U/L     Ct Abdomen Pelvis W Contrast  11/04/2012  *RADIOLOGY REPORT*  Clinical Data: Left lower quadrant abdominal pain  CT ABDOMEN AND PELVIS WITH CONTRAST  Technique:  Multidetector CT imaging of the abdomen and pelvis was performed following the standard protocol during bolus administration of intravenous contrast.  Contrast: OMNIPAQUE IOHEXOL 300 MG/ML  SOLN  Comparison: 11/04/2012 radiograph, 01/23/2011 CT.  09/13/2009 unenhanced CT.  Findings: Lung bases are clear.  Heart size within normal limits. Breast prostheses. No pleural or pericardial effusion.  Unremarkable liver, biliary system, spleen, pancreas, left adrenal gland.  2.7 cm right adrenal nodule is incompletely characterized however grossly similar to prior. Favored to represent an adenoma when corresponded to the 2010 unenhanced CT.  A couple tiny renal hypodensities, too small further characterize. Otherwise, symmetric renal enhancement.  No hydronephrosis or hydroureter.  The colon is relatively decompressed.  Normal appendix. Proximal most and distal small bowel loops are decompressed.  However, there are dilated mid small bowel loops with air-fluid levels, measuring up to 3.4 cm.  There is a transition point in the pelvis where a focal circumferentially thickened segment is seen on series 2 image 71. Small amount of free fluid within the pelvis dependently.  No free intraperitoneal air.  No lymphadenopathy.  Normal caliber  aorta and branch vessels.  Thin-walled bladder.  Unremarkable CT appearance to the uterus and adnexa.  No acute osseous finding.  IMPRESSION: There are dilated small bowel loops with transition/delayed transit through a focally thickened segment of small bowel in the pelvis.  This may reflect an enteritis as can be seen with a focal inflammatory, infectious, or ischemic process.  An underlying mass is not excluded. Recommend GI consultation.  Small amount of free fluid within the pelvis is nonspecific.   Original Report Authenticated By: Jearld Lesch, M.D.    Dg Abd Acute W/chest  11/04/2012  *RADIOLOGY REPORT*  Clinical Data: Nausea, vomiting, upper abdominal pain for 12 hours  ACUTE ABDOMEN SERIES (ABDOMEN 2 VIEW & CHEST 1 VIEW)  Comparison: Abdominal radiographs 01/24/2011  Findings: Normal heart size and mediastinal contours. Bilateral upper lobe opacities are present with superior retraction of the hila bilaterally suggesting upper lobe scarring. Unable to exclude nodular foci in the left upper lobe and at lateral mid right lung. Remaining lungs hyperaerated but clear. Bones demineralized. Few nonspecific loops of small bowel in mid abdomen, upper normal in size. Few scattered air fluid levels. No definite bowel wall thickening or free intraperitoneal air. No urinary tract calcification.  IMPRESSION: Hyperaerated lungs with volume loss in the upper lobes with superior retraction of the hila likely scarring. Areas of opacity in the left upper lobe and lateral mid right lung are somewhat more nodular appearance and underlying pulmonary nodules not excluded. If patient has prior outside chest radiographs these would be of benefit in establishing stability of these findings. In the absence of prior exams, recommend CT to exclude pulmonary nodules.   Original Report Authenticated By: Ulyses Southward, M.D.     [facility] except above Blood pressure 116/70, pulse 94, temperature 98.6 F (37 C), temperature source Oral, resp. rate 16, SpO2 97.00%. Physical ExamVital signs stable afebrile no acute distress exam pertinent for a little left middle quadrant discomfort positive bowel sounds no guarding or rebound no pedal edema good peripheral pulses labs And CT  reviewed  Assessment/Plan: Abdominal pain nausea vomiting abnormal CAT scan questionable etiology Plan: Clear liquids today if better consider colonoscopy Wednesday or Thursday or even as an outpatient and she might need in the future a small bowel follow-through or CTenteroperaGraphy And doubtful a capsule endoscopy for fears of it getting caught and will check on tomorrow and decide how to proceedMAGOD,Joseh Sjogren E 11/04/2012, 3:26 PM

## 2012-11-04 NOTE — ED Notes (Signed)
Patient states she is unable to drink the contrast. It is making her vomit. No emesis noted. Patient is dry heaving.

## 2012-11-04 NOTE — ED Notes (Signed)
Labs redraw and sent to Lab

## 2012-11-04 NOTE — Consult Note (Signed)
Reason for Consult:bowel obstruction Referring Physician: [name] is an 41 y.o. female.  HPI:  We were asked to evaluate this patient for possible bowel junction. She said that she was a very usual state of health until Thursday when she began having some mild abdominal discomfort which she describes as a belt pushing into the abdomen.  She says that she just didn't feel very well and she began having nausea and vomiting all day on Sunday. She has not had any fevers or chills and she says that she has been moving her bowels but her last bowel movement yesterday and she says it was normal. She does not have any history of diarrhea or constipation. She does have a similar episode last year which was treated with admission and NG tube decompression and IV hydration and no surgery was required. Since her last episode of this she has had a few smaller episodes as well but she did not go to the emergency room for evaluation. She has not had any colonoscopy or other workup of a potential cause for this. She does not have any prior abdominal surgery. History reviewed. No pertinent past medical history.  Past Surgical History  Procedure Date  . Other surgical history     heel surgery    No family history on file.  Social History:  reports that she has never smoked. She does not have any smokeless tobacco history on file. She reports that she does not drink alcohol or use illicit drugs.  Allergies: No Known Allergies  Medications: I have reviewed the patient's current medications.  Results for orders placed during the hospital encounter of 11/04/12 (from the past 48 hour(s))  URINALYSIS, ROUTINE W REFLEX MICROSCOPIC     Status: Abnormal   Collection Time   11/04/12  1:02 AM      Component Value Range Comment   Color, Urine YELLOW  YELLOW    APPearance CLOUDY (*) CLEAR    Specific Gravity, Urine 1.038 (*) 1.005 - 1.030    pH 6.0  5.0 - 8.0    Glucose, UA NEGATIVE  NEGATIVE mg/dL    Hgb urine dipstick SMALL (*) NEGATIVE    Bilirubin Urine SMALL (*) NEGATIVE    Ketones, ur >80 (*) NEGATIVE mg/dL    Protein, ur 213 (*) NEGATIVE mg/dL    Urobilinogen, UA 0.2  0.0 - 1.0 mg/dL    Nitrite NEGATIVE  NEGATIVE    Leukocytes, UA NEGATIVE  NEGATIVE   PREGNANCY, URINE     Status: Normal   Collection Time   11/04/12  1:02 AM      Component Value Range Comment   Preg Test, Ur NEGATIVE  NEGATIVE   URINE MICROSCOPIC-ADD ON     Status: Abnormal   Collection Time   11/04/12  1:02 AM      Component Value Range Comment   Squamous Epithelial / LPF RARE  RARE    WBC, UA 0-2  <3 WBC/hpf    RBC / HPF 3-6  <3 RBC/hpf    Bacteria, UA RARE  RARE    Crystals CA OXALATE CRYSTALS (*) NEGATIVE    Urine-Other MUCOUS PRESENT   LESS THAN 10 mL OF URINE SUBMITTED  CBC WITH DIFFERENTIAL     Status: Abnormal   Collection Time   11/04/12  3:52 AM      Component Value Range Comment   WBC 13.0 (*) 4.0 - 10.5 K/uL    RBC 4.25  3.87 - 5.11 MIL/uL  Hemoglobin 12.0  12.0 - 15.0 g/dL    HCT 40.9 (*) 81.1 - 46.0 %    MCV 83.8  78.0 - 100.0 fL    MCH 28.2  26.0 - 34.0 pg    MCHC 33.7  30.0 - 36.0 g/dL    RDW 91.4  78.2 - 95.6 %    Platelets 344  150 - 400 K/uL    Neutrophils Relative 94 (*) 43 - 77 %    Neutro Abs 12.2 (*) 1.7 - 7.7 K/uL    Lymphocytes Relative 5 (*) 12 - 46 %    Lymphs Abs 0.7  0.7 - 4.0 K/uL    Monocytes Relative 1 (*) 3 - 12 %    Monocytes Absolute 0.2  0.1 - 1.0 K/uL    Eosinophils Relative 0  0 - 5 %    Eosinophils Absolute 0.0  0.0 - 0.7 K/uL    Basophils Relative 0  0 - 1 %    Basophils Absolute 0.0  0.0 - 0.1 K/uL   COMPREHENSIVE METABOLIC PANEL     Status: Abnormal   Collection Time   11/04/12  3:52 AM      Component Value Range Comment   Sodium 137  135 - 145 mEq/L    Potassium 3.7  3.5 - 5.1 mEq/L    Chloride 101  96 - 112 mEq/L    CO2 23  19 - 32 mEq/L    Glucose, Bld 113 (*) 70 - 99 mg/dL    BUN 12  6 - 23 mg/dL    Creatinine, Ser 2.13  0.50 - 1.10 mg/dL     Calcium 9.0  8.4 - 10.5 mg/dL    Total Protein 7.3  6.0 - 8.3 g/dL    Albumin 3.9  3.5 - 5.2 g/dL    AST 16  0 - 37 U/L    ALT 13  0 - 35 U/L    Alkaline Phosphatase 57  39 - 117 U/L    Total Bilirubin 0.3  0.3 - 1.2 mg/dL    GFR calc non Af Amer >90  >90 mL/min    GFR calc Af Amer >90  >90 mL/min   LIPASE, BLOOD     Status: Normal   Collection Time   11/04/12  3:52 AM      Component Value Range Comment   Lipase 32  11 - 59 U/L     Ct Abdomen Pelvis W Contrast  11/04/2012  *RADIOLOGY REPORT*  Clinical Data: Left lower quadrant abdominal pain  CT ABDOMEN AND PELVIS WITH CONTRAST  Technique:  Multidetector CT imaging of the abdomen and pelvis was performed following the standard protocol during bolus administration of intravenous contrast.  Contrast: OMNIPAQUE IOHEXOL 300 MG/ML  SOLN  Comparison: 11/04/2012 radiograph, 01/23/2011 CT.  09/13/2009 unenhanced CT.  Findings: Lung bases are clear.  Heart size within normal limits. Breast prostheses. No pleural or pericardial effusion.  Unremarkable liver, biliary system, spleen, pancreas, left adrenal gland.  2.7 cm right adrenal nodule is incompletely characterized however grossly similar to prior. Favored to represent an adenoma when corresponded to the 2010 unenhanced CT.  A couple tiny renal hypodensities, too small further characterize. Otherwise, symmetric renal enhancement.  No hydronephrosis or hydroureter.  The colon is relatively decompressed.  Normal appendix. Proximal most and distal small bowel loops are decompressed.  However, there are dilated mid small bowel loops with air-fluid levels, measuring up to 3.4 cm.  There is a transition point in  the pelvis where a focal circumferentially thickened segment is seen on series 2 image 71. Small amount of free fluid within the pelvis dependently.  No free intraperitoneal air.  No lymphadenopathy.  Normal caliber aorta and branch vessels.  Thin-walled bladder.  Unremarkable CT appearance to  the uterus and adnexa.  No acute osseous finding.  IMPRESSION: There are dilated small bowel loops with transition/delayed transit through a focally thickened segment of small bowel in the pelvis. This may reflect an enteritis as can be seen with a focal inflammatory, infectious, or ischemic process.  An underlying mass is not excluded. Recommend GI consultation.  Small amount of free fluid within the pelvis is nonspecific.   Original Report Authenticated By: Jearld Lesch, M.D.    Dg Abd Acute W/chest  11/04/2012  *RADIOLOGY REPORT*  Clinical Data: Nausea, vomiting, upper abdominal pain for 12 hours  ACUTE ABDOMEN SERIES (ABDOMEN 2 VIEW & CHEST 1 VIEW)  Comparison: Abdominal radiographs 01/24/2011  Findings: Normal heart size and mediastinal contours. Bilateral upper lobe opacities are present with superior retraction of the hila bilaterally suggesting upper lobe scarring. Unable to exclude nodular foci in the left upper lobe and at lateral mid right lung. Remaining lungs hyperaerated but clear. Bones demineralized. Few nonspecific loops of small bowel in mid abdomen, upper normal in size. Few scattered air fluid levels. No definite bowel wall thickening or free intraperitoneal air. No urinary tract calcification.  IMPRESSION: Hyperaerated lungs with volume loss in the upper lobes with superior retraction of the hila likely scarring. Areas of opacity in the left upper lobe and lateral mid right lung are somewhat more nodular appearance and underlying pulmonary nodules not excluded. If patient has prior outside chest radiographs these would be of benefit in establishing stability of these findings. In the absence of prior exams, recommend CT to exclude pulmonary nodules.   Original Report Authenticated By: Ulyses Southward, M.D.     All other review of systems negative or noncontributory except as stated in the HPI  Blood pressure 134/88, pulse 97, temperature 97.9 F (36.6 C), temperature source Oral, resp.  rate 20, SpO2 100.00%. General appearance: alert, cooperative and no distress Head: Normocephalic, without obvious abnormality, atraumatic Neck: no JVD and supple, symmetrical, trachea midline Resp: nonlabored Cardio: normal rate, regular GI: soft, she was sitting up 90 in the bed without any obvious discomfort, on palpation she had suprapubic and LLQ pain minimal RLQ pain, ND, no peritoneal signs. Extremities: extremities normal, atraumatic, no cyanosis or edema Pulses: 2+ and symmetric Neurologic: Grossly normal  Assessment/Plan: Nausea and vomiting and abdominal pain I do not think that this is really a bowel obstruction. I think that this is more likely an enteritis possibly infectious or  Inflammatory. Her abdominal exam is only positive for some suprapubic and left lower quadrant tenderness and she does not appear distended on exam. She does have a mild leukocytosis and and a CT scan concerning for possible enteritis and possible relative narrowing in this area. I do not see any indication for surgery at this time. However, I don't think that she is healthy enough for discharge. She is not taking any PO intake.  I would recommend admission for observation and IV hydration and further workup of her enteritis and I recommend GI evaluation. Pain control and plus or minus NG tube. Surgery will follow along. Lodema Pilot DAVID 11/04/2012, 6:25 AM

## 2012-11-04 NOTE — Progress Notes (Signed)
ANTIBIOTIC CONSULT NOTE - INITIAL  Pharmacy Consult for Zosyn Indication: r/o enteritis  No Known Allergies  Patient Measurements:     Vital Signs: Temp: 98.6 F (37 C) (11/18 1039) Temp src: Oral (11/18 1039) BP: 116/70 mmHg (11/18 1039) Pulse Rate: 94  (11/18 1039)   Labs:  Basename 11/04/12 0352  WBC 13.0*  HGB 12.0  PLT 344  LABCREA --  CREATININE 0.61   CrCl is unknown because there is no height on file for the current visit. Normalized CrCl > 100 mL/min/72kg    Microbiology: No results found for this or any previous visit (from the past 720 hour(s)).  Medical History: History reviewed. No pertinent past medical history.  Medications:  Scheduled:    . [COMPLETED] diphenhydrAMINE  25 mg Intravenous Once  . enoxaparin (LOVENOX) injection  40 mg Subcutaneous Q24H  . HYDROmorphone      . [COMPLETED]  HYDROmorphone (DILAUDID) injection  1 mg Intravenous Once  . [COMPLETED] metoCLOPramide (REGLAN) injection  10 mg Intravenous Once  . [COMPLETED]  morphine injection  6 mg Intravenous Once  . ondansetron      . [COMPLETED] ondansetron (ZOFRAN) IV  4 mg Intravenous Once  . [COMPLETED] ondansetron (ZOFRAN) IV  4 mg Intravenous Once  . [COMPLETED] sodium chloride  1,000 mL Intravenous Once   Infusions:    . sodium chloride 125 mL/hr at 11/04/12 1129   PRN: acetaminophen, acetaminophen, HYDROmorphone (DILAUDID) injection, [COMPLETED] iohexol, ondansetron (ZOFRAN) IV, ondansetron  Assessment:  41 y/o F with LLQ abdominal pain, CT c/w possible enteritis.  To begin empiric therapy with Zosyn.  Goal of Therapy:  Eradication of infection Zosyn extended infusion therapy with dosage appropriate for renal function  Plan:   Zosyn 3.375 grams IV q8h (extended-infusion, each dose over 4 hours).  Follow clinical course.   Elie Goody, PharmD, BCPS Pager: [phone] 11/04/2012  11:38 AM

## 2012-11-04 NOTE — ED Notes (Signed)
Patient dry heaving. No emesis at this time.

## 2012-11-05 DIAGNOSIS — K5090 Crohn's disease, unspecified, without complications: Secondary | ICD-10-CM | POA: Diagnosis present

## 2012-11-05 DIAGNOSIS — D500 Iron deficiency anemia secondary to blood loss (chronic): Secondary | ICD-10-CM | POA: Diagnosis present

## 2012-11-05 DIAGNOSIS — R109 Unspecified abdominal pain: Secondary | ICD-10-CM | POA: Diagnosis present

## 2012-11-05 DIAGNOSIS — R112 Nausea with vomiting, unspecified: Secondary | ICD-10-CM | POA: Diagnosis present

## 2012-11-05 LAB — CBC
Hemoglobin: 10.8 g/dL — ABNORMAL LOW (ref 12.0–15.0)
MCH: 28.1 pg (ref 26.0–34.0)
RBC: 3.84 MIL/uL — ABNORMAL LOW (ref 3.87–5.11)

## 2012-11-05 LAB — BASIC METABOLIC PANEL
Chloride: 106 mEq/L (ref 96–112)
GFR calc Af Amer: >90 mL/min (ref >90)
GFR calc non Af Amer: >90 mL/min (ref >90)
Glucose, Bld: 92 mg/dL (ref 70–99)
Potassium: 3.3 mEq/L — ABNORMAL LOW (ref 3.5–5.1)
Sodium: 139 mEq/L (ref 135–145)

## 2012-11-05 LAB — SEDIMENTATION RATE: Sed Rate: 12 mm/hr (ref 0–22)

## 2012-11-05 LAB — IRON AND TIBC
Iron: 51 ug/dL (ref 42–135)
TIBC: 361 ug/dL (ref 250–470)

## 2012-11-05 LAB — VITAMIN B12: Vitamin B-12: 865 pg/mL (ref 211–911)

## 2012-11-05 MED ORDER — CIPROFLOXACIN IN D5W 400 MG/200ML IV SOLN
400.0000 mg | Freq: Two times a day (BID) | INTRAVENOUS | Status: DC
  Administered 2012-11-05 (×2): 400 mg via INTRAVENOUS
  Filled 2012-11-05 (×3): qty 200

## 2012-11-05 MED ORDER — METRONIDAZOLE IN NACL 5-0.79 MG/ML-% IV SOLN
500.0000 mg | Freq: Three times a day (TID) | INTRAVENOUS | Status: DC
  Administered 2012-11-05 – 2012-11-06 (×3): 500 mg via INTRAVENOUS
  Filled 2012-11-05 (×5): qty 100

## 2012-11-05 NOTE — Progress Notes (Signed)
Patient with blood pressure of 145/92 at 1500, rechecked at 1630 was 137/93.  Patient was complaining of a headache.  Patient medicated with Tylenol.  Dr. Robb Matar aware of this information.

## 2012-11-05 NOTE — Progress Notes (Signed)
INITIAL ADULT NUTRITION ASSESSMENT Date: 11/05/2012   Time: 2:09 PM Reason for Assessment: Nutrition risk   INTERVENTION: Diet advancement per MD. Will monitor.   ASSESSMENT: Female 41 y.o.  Dx: Abdominal pain   Food/Nutrition Related Hx: Pt reports typically eating 2 meals/day with good appetite and stable weight, however pt developed ongoing vomiting Sunday PTA. Pt denies any nausea or vomiting today. Pt with history of small bowel obstruction 2 years ago requiring NGT. MD notes pt without obstruction. Pt with diarrhea today. GI plans to do possible colonscopy or CT enteroscopy Thursday.   Hx:  History reviewed. No pertinent past medical history.  Related Meds:  Scheduled Meds:   . ciprofloxacin  400 mg Intravenous Q12H  . enoxaparin (LOVENOX) injection  40 mg Subcutaneous Q24H  . [EXPIRED] HYDROmorphone      . metronidazole  500 mg Intravenous Q8H  . [EXPIRED] ondansetron      . [DISCONTINUED] piperacillin-tazobactam (ZOSYN)  IV  3.375 g Intravenous Q8H   Continuous Infusions:   . sodium chloride 125 mL/hr at 11/04/12 1632   PRN Meds:.acetaminophen, acetaminophen, HYDROmorphone (DILAUDID) injection, ondansetron (ZOFRAN) IV, ondansetron  Ht: 5\' 7"  (170.2 cm)  Wt: 140 lb (63.504 kg)  Ideal Wt: 135 lb % Ideal Wt: 104  Usual Wt: 140 lb % Usual Wt: 100  Body mass index is 21.93 kg/(m^2).     Labs:  CMP     Component Value Date/Time   NA 139 11/05/2012 0510   K 3.3* 11/05/2012 0510   CL 106 11/05/2012 0510   CO2 25 11/05/2012 0510   GLUCOSE 92 11/05/2012 0510   BUN 7 11/05/2012 0510   CREATININE 0.80 11/05/2012 0510   CALCIUM 8.2* 11/05/2012 0510   PROT 7.3 11/04/2012 0352   ALBUMIN 3.9 11/04/2012 0352   AST 16 11/04/2012 0352   ALT 13 11/04/2012 0352   ALKPHOS 57 11/04/2012 0352   BILITOT 0.3 11/04/2012 0352   GFRNONAA >90 11/05/2012 0510   GFRAA >90 11/05/2012 0510    Intake/Output Summary (Last 24 hours) at 11/05/12 1430 Last data filed at  11/05/12 0500  Gross per 24 hour  Intake 2902.08 ml  Output      0 ml  Net 2902.08 ml   Last BM - 11/19  Diet Order: Clear Liquid   IVF:    sodium chloride Last Rate: 125 mL/hr at 11/04/12 1632    Estimated Nutritional Needs:   Kcal:1600-1900 Protein:65-75g Fluid:1.6-1.9L  NUTRITION DIAGNOSIS: -Inadequate oral intake (NI-2.1).  Status: Ongoing  RELATED TO: nausea  AS EVIDENCE BY: clear liquid diet  MONITORING/EVALUATION(Goals): Advance diet as tolerated to bland diet.   EDUCATION NEEDS: -Education needs addressed - used teach back method educate pt on nutrition therapy for nausea/vomiting.    Dietitian [phone]  DOCUMENTATION CODES Per approved criteria  -Not Applicable    Maryann, Mccall 11/05/2012, 2:09 PM

## 2012-11-05 NOTE — Progress Notes (Addendum)
[facility] PROGRESS NOTE  Assessment/Plan: Abdominal pain (11/05/2012) - Initially NPO, refused NG tube, Nauseated, vomiting twice NPO overnight. NPO this morning just ice chips. Clear liq diet at noon. She is anorexic. - Surgery recommended medical management, Agree with GI for colonoscopy Wednesday or Thursday or even as an outpatient. ? Infectious VS inflammatory disease.  Blood loss anemia: - MCV WNL, does not appear intravascular depleted by labs, was overnight on IV Fluids. - Previous HBg. were 12-14 in past years, now 10.0 overnight ? Blood loss 2 gr drop without a drop in Hbg concerning - concern for blood loss anemia. FOBT stools. - Anemia panel, she is a menstruating female, ESR <20, ferritin should be reliable.  Enteritis (11/05/2012) - change zosyn to Cipro and flagyl  Nausea and vomiting in adult (11/05/2012) - zofran.    Code Status: full Family Communication: none  Disposition Plan: Home   Consultants:  Surgery  GI  Procedures:  Ct abdomen  Antibiotics:  cipro and flagyl 11.1.9 (indicate start date, and stop date if known)  HPI/Subjective: Nauseated overnight. Wants to try to eat this afternoon if her Nausea resolves.   Objective: Filed Vitals:   11/04/12 1039 11/04/12 1500 11/04/12 2140 11/05/12 0552  BP: 116/70 117/74 124/81 123/76  Pulse: 94 92 89 88  Temp: 98.6 F (37 C) 98.4 F (36.9 C) 97.9 F (36.6 C) 98.2 F (36.8 C)  TempSrc: Oral Oral Oral Oral  Resp: 16 18 18 18   Height:    5' 7"  (1.702 m)  Weight:    63.504 kg (140 lb)  SpO2: 97% 96% 95% 99%    Intake/Output Summary (Last 24 hours) at 11/05/12 0727 Last data filed at 11/05/12 0500  Gross per 24 hour  Intake 2902.08 ml  Output      0 ml  Net 2902.08 ml   Filed Weights   11/05/12 0552  Weight: 63.504 kg (140 lb)    Exam:  General: Alert, awake, oriented x3, in no acute distress.  HEENT: No bruits, no goiter.  Heart: Regular rate and rhythm, without murmurs,  rubs, gallops.  Lungs: Good air movement, clear to auscultation. Abdomen: Soft, nontender, nondistended, positive bowel sounds.  Neuro: Grossly intact, nonfocal.   Data Reviewed: Basic Metabolic Panel:  Lab 57/32/20 0510 11/04/12 0352  NA 139 137  K 3.3* 3.7  CL 106 101  CO2 25 23  GLUCOSE 92 113*  BUN 7 12  CREATININE 0.80 0.61  CALCIUM 8.2* 9.0  MG -- --  PHOS -- --   Liver Function Tests:  Lab 11/04/12 0352  AST 16  ALT 13  ALKPHOS 57  BILITOT 0.3  PROT 7.3  ALBUMIN 3.9    Lab 11/04/12 0352  LIPASE 32  AMYLASE --   No results found for this basename: AMMONIA:5 in the last 168 hours CBC:  Lab 11/05/12 0510 11/04/12 0352  WBC 7.1 13.0*  NEUTROABS -- 12.2*  HGB 10.8* 12.0  HCT 32.9* 35.6*  MCV 85.7 83.8  PLT 277 344   Cardiac Enzymes: No results found for this basename: CKTOTAL:5,CKMB:5,CKMBINDEX:5,TROPONINI:5 in the last 168 hours BNP (last 3 results) No results found for this basename: PROBNP:3 in the last 8760 hours CBG: No results found for this basename: GLUCAP:5 in the last 168 hours  No results found for this or any previous visit (from the past 240 hour(s)).   Studies: Ct Abdomen Pelvis W Contrast  11/04/2012  *RADIOLOGY REPORT*  Clinical Data: Left lower quadrant abdominal pain  CT  ABDOMEN AND PELVIS WITH CONTRAST  Technique:  Multidetector CT imaging of the abdomen and pelvis was performed following the standard protocol during bolus administration of intravenous contrast.  Contrast: 169m OMNIPAQUE IOHEXOL 300 MG/ML  SOLN  Comparison: 11/04/2012 radiograph, 01/23/2011 CT.  09/13/2009 unenhanced CT.  Findings: Lung bases are clear.  Heart size within normal limits. Breast prostheses. No pleural or pericardial effusion.  Unremarkable liver, biliary system, spleen, pancreas, left adrenal gland.  2.7 cm right adrenal nodule is incompletely characterized however grossly similar to prior. Favored to represent an adenoma when corresponded to the 2010  unenhanced CT.  A couple tiny renal hypodensities, too small further characterize. Otherwise, symmetric renal enhancement.  No hydronephrosis or hydroureter.  The colon is relatively decompressed.  Normal appendix. Proximal most and distal small bowel loops are decompressed.  However, there are dilated mid small bowel loops with air-fluid levels, measuring up to 3.4 cm.  There is a transition point in the pelvis where a focal circumferentially thickened segment is seen on series 2 image 71. Small amount of free fluid within the pelvis dependently.  No free intraperitoneal air.  No lymphadenopathy.  Normal caliber aorta and branch vessels.  Thin-walled bladder.  Unremarkable CT appearance to the uterus and adnexa.  No acute osseous finding.  IMPRESSION: There are dilated small bowel loops with transition/delayed transit through a focally thickened segment of small bowel in the pelvis. This may reflect an enteritis as can be seen with a focal inflammatory, infectious, or ischemic process.  An underlying mass is not excluded. Recommend GI consultation.  Small amount of free fluid within the pelvis is nonspecific.   Original Report Authenticated By: ACarlos Levering M.D.    Dg Abd Acute W/chest  11/04/2012  *RADIOLOGY REPORT*  Clinical Data: Nausea, vomiting, upper abdominal pain for 12 hours  ACUTE ABDOMEN SERIES (ABDOMEN 2 VIEW & CHEST 1 VIEW)  Comparison: Abdominal radiographs 01/24/2011  Findings: Normal heart size and mediastinal contours. Bilateral upper lobe opacities are present with superior retraction of the hila bilaterally suggesting upper lobe scarring. Unable to exclude nodular foci in the left upper lobe and at lateral mid right lung. Remaining lungs hyperaerated but clear. Bones demineralized. Few nonspecific loops of small bowel in mid abdomen, upper normal in size. Few scattered air fluid levels. No definite bowel wall thickening or free intraperitoneal air. No urinary tract calcification.   IMPRESSION: Hyperaerated lungs with volume loss in the upper lobes with superior retraction of the hila likely scarring. Areas of opacity in the left upper lobe and lateral mid right lung are somewhat more nodular appearance and underlying pulmonary nodules not excluded. If patient has prior outside chest radiographs these would be of benefit in establishing stability of these findings. In the absence of prior exams, recommend CT to exclude pulmonary nodules.   Original Report Authenticated By: MLavonia Dana M.D.     Scheduled Meds:    . enoxaparin (LOVENOX) injection  40 mg Subcutaneous Q24H  . [EXPIRED] HYDROmorphone      . [COMPLETED]  HYDROmorphone (DILAUDID) injection  1 mg Intravenous Once  . [EXPIRED] ondansetron      . [COMPLETED] ondansetron (ZOFRAN) IV  4 mg Intravenous Once  . piperacillin-tazobactam (ZOSYN)  IV  3.375 g Intravenous Q8H   Continuous Infusions:    . sodium chloride 125 mL/hr at 11/04/12 1[place] ABRAHAM  [facility] Pager [phone] If 8PM-8AM, please contact night-coverage at [email or web address], password [email or web address]11/19/2013, 7:27 AM  LOS:  1 day

## 2012-11-05 NOTE — Progress Notes (Signed)
More diarrhea today - no obstruction  Still mildly distended with persistent LLQ tenderness.  GI planning possible colonoscopy within next day or two.  Will follow.  Would not advance diet  Imogene Burn. Georgette Dover, MD, [facility]  11/05/2012 1:30 PM

## 2012-11-05 NOTE — Progress Notes (Signed)
Levin Bacon 12:03 PM  Subjective: Patient is a little better overall but is still having some nausea and vomiting and minimal pain and is moving her bowels and no new complaints  Objective: Vital signs stable afebrile no acute distress abdomen is soft decreased tenderness decreased white count Assessment: Improved  Plan: I still don't think she could drink any prep  for a colonoscopy yet but my guess is she'll be better and able to do that tomorrow and probable proceed with a colonoscopy on Thursday could consider a CT enteroscopy instead and continue clear liquids for now  [facility] E

## 2012-11-05 NOTE — Progress Notes (Signed)
Subjective: Feels better this morning, no c/o of any additional abdominal pain, remains tender in mid to LLQ. Has been having some diarrhea lately. Bout of nausea and emesis x 1 last night with food.  Objective: Vital signs in last 24 hours: Temp:  [97.9 F (36.6 C)-98.6 F (37 C)] 98.2 F (36.8 C) (11/19 0552) Pulse Rate:  [88-100] 88  (11/19 0552) Resp:  [16-18] 18  (11/19 0552) BP: (116-127)/(70-94) 123/76 mmHg (11/19 0552) SpO2:  [94 %-99 %] 99 % (11/19 0552) Weight:  [140 lb (63.504 kg)] 140 lb (63.504 kg) (11/19 0552) Last BM Date: 11/03/12  Intake/Output from previous day: 11/18 0701 - 11/19 0700 In: 2902.1 [I.V.:2752.1; IV Piggyback:150] Out: -  Intake/Output this shift:    General appearance: alert, cooperative, appears stated age and no distress Chest: CTA bilaterally Cardiac: RRR Abdomen: tender to palpation in mid to LLQ. + BS, flatus BM (recent diarrhea) described as brownish. Labs: H&H has dropped 2 grams from last check, hypokalemia. Creatine has trended upward. Lab Results:   [facility] 11/05/12 0510 11/04/12 0352  WBC 7.1 13.0*  HGB 10.8* 12.0  HCT 32.9* 35.6*  PLT 277 344   BMET  Basename 11/05/12 0510 11/04/12 0352  NA 139 137  K 3.3* 3.7  CL 106 101  CO2 25 23  GLUCOSE 92 113*  BUN 7 12  CREATININE 0.80 0.61  CALCIUM 8.2* 9.0   PT/INR No results found for this basename: LABPROT:2,INR:2 in the last 72 hours ABG No results found for this basename: PHART:2,PCO2:2,PO2:2,HCO3:2 in the last 72 hours  Studies/Results: Ct Abdomen Pelvis W Contrast  11/04/2012  *RADIOLOGY REPORT*  Clinical Data: Left lower quadrant abdominal pain  CT ABDOMEN AND PELVIS WITH CONTRAST  Technique:  Multidetector CT imaging of the abdomen and pelvis was performed following the standard protocol during bolus administration of intravenous contrast.  Contrast: OMNIPAQUE IOHEXOL 300 MG/ML  SOLN  Comparison: 11/04/2012 radiograph, 01/23/2011 CT.  09/13/2009  unenhanced CT.  Findings: Lung bases are clear.  Heart size within normal limits. Breast prostheses. No pleural or pericardial effusion.  Unremarkable liver, biliary system, spleen, pancreas, left adrenal gland.  2.7 cm right adrenal nodule is incompletely characterized however grossly similar to prior. Favored to represent an adenoma when corresponded to the 2010 unenhanced CT.  A couple tiny renal hypodensities, too small further characterize. Otherwise, symmetric renal enhancement.  No hydronephrosis or hydroureter.  The colon is relatively decompressed.  Normal appendix. Proximal most and distal small bowel loops are decompressed.  However, there are dilated mid small bowel loops with air-fluid levels, measuring up to 3.4 cm.  There is a transition point in the pelvis where a focal circumferentially thickened segment is seen on series 2 image 71. Small amount of free fluid within the pelvis dependently.  No free intraperitoneal air.  No lymphadenopathy.  Normal caliber aorta and branch vessels.  Thin-walled bladder.  Unremarkable CT appearance to the uterus and adnexa.  No acute osseous finding.  IMPRESSION: There are dilated small bowel loops with transition/delayed transit through a focally thickened segment of small bowel in the pelvis. This may reflect an enteritis as can be seen with a focal inflammatory, infectious, or ischemic process.  An underlying mass is not excluded. Recommend GI consultation.  Small amount of free fluid within the pelvis is nonspecific.   Original Report Authenticated By: Jearld Lesch, M.D.    Dg Abd Acute W/chest  11/04/2012  *RADIOLOGY REPORT*  Clinical Data: Nausea, vomiting, upper abdominal pain  for 12 hours  ACUTE ABDOMEN SERIES (ABDOMEN 2 VIEW & CHEST 1 VIEW)  Comparison: Abdominal radiographs 01/24/2011  Findings: Normal heart size and mediastinal contours. Bilateral upper lobe opacities are present with superior retraction of the hila bilaterally suggesting upper  lobe scarring. Unable to exclude nodular foci in the left upper lobe and at lateral mid right lung. Remaining lungs hyperaerated but clear. Bones demineralized. Few nonspecific loops of small bowel in mid abdomen, upper normal in size. Few scattered air fluid levels. No definite bowel wall thickening or free intraperitoneal air. No urinary tract calcification.  IMPRESSION: Hyperaerated lungs with volume loss in the upper lobes with superior retraction of the hila likely scarring. Areas of opacity in the left upper lobe and lateral mid right lung are somewhat more nodular appearance and underlying pulmonary nodules not excluded. If patient has prior outside chest radiographs these would be of benefit in establishing stability of these findings. In the absence of prior exams, recommend CT to exclude pulmonary nodules.   Original Report Authenticated By: Ulyses Southward, M.D.     Anti-infectives: Anti-infectives     Start     Dose/Rate Route Frequency Ordered Stop   11/05/12 0800   ciprofloxacin (CIPRO) IVPB 400 mg        400 mg 200 mL/hr over 60 Minutes Intravenous Every 12 hours 11/05/12 0730     11/05/12 0800   metroNIDAZOLE (FLAGYL) IVPB 500 mg        500 mg 100 mL/hr over 60 Minutes Intravenous Every 8 hours 11/05/12 0730     11/04/12 1200   piperacillin-tazobactam (ZOSYN) IVPB 3.375 g  Status:  Discontinued        3.375 g 12.5 mL/hr over 240 Minutes Intravenous Every 8 hours 11/04/12 1135 11/05/12 0729          Assessment/Plan: s/p * No surgery found *  Patient Active Problem List  Diagnosis  . Abdominal pain  . Enteritis  . Nausea and vomiting in adult  . Blood loss anemia   1. Recommend checking stool for blood. 2. ? Need for colonoscopy to r/o GI bleed as cause of H&H drop. 3. Recommend NPO, may need NG if N/V continue 4. Management per medicine.  We will continue to follow  LOS: 1 day    Golda Acre [facility] Surgery Pager # [phone]  11/05/2012

## 2012-11-06 MED ORDER — METRONIDAZOLE 500 MG PO TABS
500.0000 mg | ORAL_TABLET | Freq: Three times a day (TID) | ORAL | Status: DC
  Administered 2012-11-06: 500 mg via ORAL
  Filled 2012-11-06 (×4): qty 1

## 2012-11-06 MED ORDER — ONDANSETRON HCL 4 MG PO TABS: 4.0000 mg | ORAL_TABLET | Freq: Four times a day (QID) | ORAL | Status: DC | PRN

## 2012-11-06 MED ORDER — CIPROFLOXACIN HCL 500 MG PO TABS
500.0000 mg | ORAL_TABLET | Freq: Two times a day (BID) | ORAL | Status: DC
  Administered 2012-11-06: 500 mg via ORAL
  Filled 2012-11-06 (×3): qty 1

## 2012-11-06 MED ORDER — SODIUM CHLORIDE 0.9 % IV SOLN
INTRAVENOUS | Status: DC
  Administered 2012-11-06: 75 mL/h via INTRAVENOUS

## 2012-11-06 NOTE — Progress Notes (Signed)
Patient complaining of headaches.  Dr. Betti Cruz aware. No further interventions at this time. Blood pressure stable. Will continue to monitor.

## 2012-11-06 NOTE — Discharge Summary (Addendum)
Physician Discharge Summary  Meghan Charles ZOX:[id number] DOB: [birth year] DOA: 11/04/2012  PCP: No primary provider on file.  Admit date: 11/04/2012 Discharge date: 11/06/2012  Recommendations for Outpatient Follow-up:  To follow up with Dr. Ewing Schlein as already scheduled on 12/04/2012. If any worsening symptoms please contact his office for further instructions or come back to emergency department.  Discharge Diagnoses:  Principal Problem:  *Abdominal pain Active Problems:  Enteritis  Nausea and vomiting in adult  Blood loss anemia  Discharge Condition: Stable  Diet recommendation: Heart healthy diet.  Filed Weights   11/05/12 0552  Weight: 63.504 kg (140 lb)    History of present illness:  41 y/o with history of prior SBO 3 years ago with abdominal pain, nausea, and vomiting for 3 days prior to admission on 11/04/2012.  Hospital Course:  Abdominal pain due to Enteritis? versus SBO Initially NPO, refused NG tube, as symptoms improved patient was started on clear liquid diet and diet was advanced as tolerated. Surgery recommending medical management. GI Dr. Ewing Schlein evaluated the patient offered the patient inpatient versus outpatient colonoscopy given improvement in symptoms. Patient elected outpatient colonoscopy and had appropriate followup arranged. Initially was started on cipro and flagyl due to possible enteritis noticed on CT, after discussion with Dr. Ewing Schlein, patient does not need antibiotics at discharge. Patient was also instructed that if she were to have any worsening symptoms she is to contact Dr. Ewing Schlein for further recommendations or come back to ER for evaluation.    Acute blood loss anemia  MCV WNL, drop in hemoglobin may be dilutional. Concern for blood loss anemia given enteritis. Anemia panel not suggestive of iron deficiency or anemia of chronic disease. She is a menstruating female. Did not require any transfusion during the hospital stay.  Enteritis? Initially on  Cipro and flagyl, as indicated above the antibiotics were discontinued after discharge.  Nausea and vomiting in adult Improved.   Procedures:  None.  Consultations:  [facility], Dr. Ewing Schlein.  General surgery.  Discharge Exam: Filed Vitals:   11/05/12 1634 11/05/12 2119 11/06/12 0546 11/06/12 0844  BP: 137/93 125/88 136/90 127/81  Pulse:  85 86   Temp:  98.3 F (36.8 C) 98.2 F (36.8 C)   TempSrc:  Oral Oral   Resp:  17 17   Height:      Weight:      SpO2:  100% 97%    Discharge Instructions  Discharge Orders    Future Orders Please Complete By Expires   Diet - low sodium heart healthy      Increase activity slowly      Discharge instructions      Comments:   Please follow up with Dr. Ewing Schlein as already scheduled on 12/04/2012. If any worsening symptoms please contact his office for further instructions or come back to emergency department.       Medication List     As of 11/06/2012  2:16 PM    TAKE these medications         ondansetron 4 MG tablet   Commonly known as: ZOFRAN   Take 1 tablet (4 mg total) by mouth every 6 (six) hours as needed for nausea.      PAPAYA ENZYMES PO   Take 1 tablet by mouth daily.      PROBIOTIC ACIDOPHILUS PO   Take 1 tablet by mouth daily.          The results of significant diagnostics from this hospitalization (including imaging, microbiology, ancillary and  laboratory) are listed below for reference.    Significant Diagnostic Studies: Ct Abdomen Pelvis W Contrast  11/04/2012  *RADIOLOGY REPORT*  Clinical Data: Left lower quadrant abdominal pain  CT ABDOMEN AND PELVIS WITH CONTRAST  Technique:  Multidetector CT imaging of the abdomen and pelvis was performed following the standard protocol during bolus administration of intravenous contrast.  Contrast: OMNIPAQUE IOHEXOL 300 MG/ML  SOLN  Comparison: 11/04/2012 radiograph, 01/23/2011 CT.  09/13/2009 unenhanced CT.  Findings: Lung bases are clear.  Heart size within normal  limits. Breast prostheses. No pleural or pericardial effusion.  Unremarkable liver, biliary system, spleen, pancreas, left adrenal gland.  2.7 cm right adrenal nodule is incompletely characterized however grossly similar to prior. Favored to represent an adenoma when corresponded to the 2010 unenhanced CT.  A couple tiny renal hypodensities, too small further characterize. Otherwise, symmetric renal enhancement.  No hydronephrosis or hydroureter.  The colon is relatively decompressed.  Normal appendix. Proximal most and distal small bowel loops are decompressed.  However, there are dilated mid small bowel loops with air-fluid levels, measuring up to 3.4 cm.  There is a transition point in the pelvis where a focal circumferentially thickened segment is seen on series 2 image 71. Small amount of free fluid within the pelvis dependently.  No free intraperitoneal air.  No lymphadenopathy.  Normal caliber aorta and branch vessels.  Thin-walled bladder.  Unremarkable CT appearance to the uterus and adnexa.  No acute osseous finding.  IMPRESSION: There are dilated small bowel loops with transition/delayed transit through a focally thickened segment of small bowel in the pelvis. This may reflect an enteritis as can be seen with a focal inflammatory, infectious, or ischemic process.  An underlying mass is not excluded. Recommend GI consultation.  Small amount of free fluid within the pelvis is nonspecific.   Original Report Authenticated By: Jearld Lesch, M.D.    Dg Abd Acute W/chest  11/04/2012  *RADIOLOGY REPORT*  Clinical Data: Nausea, vomiting, upper abdominal pain for 12 hours  ACUTE ABDOMEN SERIES (ABDOMEN 2 VIEW & CHEST 1 VIEW)  Comparison: Abdominal radiographs 01/24/2011  Findings: Normal heart size and mediastinal contours. Bilateral upper lobe opacities are present with superior retraction of the hila bilaterally suggesting upper lobe scarring. Unable to exclude nodular foci in the left upper lobe and at  lateral mid right lung. Remaining lungs hyperaerated but clear. Bones demineralized. Few nonspecific loops of small bowel in mid abdomen, upper normal in size. Few scattered air fluid levels. No definite bowel wall thickening or free intraperitoneal air. No urinary tract calcification.  IMPRESSION: Hyperaerated lungs with volume loss in the upper lobes with superior retraction of the hila likely scarring. Areas of opacity in the left upper lobe and lateral mid right lung are somewhat more nodular appearance and underlying pulmonary nodules not excluded. If patient has prior outside chest radiographs these would be of benefit in establishing stability of these findings. In the absence of prior exams, recommend CT to exclude pulmonary nodules.   Original Report Authenticated By: Ulyses Southward, M.D.     Microbiology: No results found for this or any previous visit (from the past 240 hour(s)).   Labs: Basic Metabolic Panel:  Lab 11/05/12 1610 11/04/12 0352  NA 139 137  K 3.3* 3.7  CL 106 101  CO2 25 23  GLUCOSE 92 113*  BUN 7 12  CREATININE 0.80 0.61  CALCIUM 8.2* 9.0  MG -- --  PHOS -- --   Liver Function Tests:  Lab 11/04/12 0352  AST 16  ALT 13  ALKPHOS 57  BILITOT 0.3  PROT 7.3  ALBUMIN 3.9    Lab 11/04/12 0352  LIPASE 32  AMYLASE --   No results found for this basename: AMMONIA:5 in the last 168 hours CBC:  Lab 11/05/12 0510 11/04/12 0352  WBC 7.1 13.0*  NEUTROABS -- 12.2*  HGB 10.8* 12.0  HCT 32.9* 35.6*  MCV 85.7 83.8  PLT 277 344   Cardiac Enzymes: No results found for this basename: CKTOTAL:5,CKMB:5,CKMBINDEX:5,TROPONINI:5 in the last 168 hours BNP: BNP (last 3 results) No results found for this basename: PROBNP:3 in the last 8760 hours CBG: No results found for this basename: GLUCAP:5 in the last 168 hours  Time spent: 25 minutes  Signed:  Candie Gintz A  [facility] 11/06/2012, 2:16 PM

## 2012-11-06 NOTE — Progress Notes (Signed)
Patient ID: Meghan Charles, female   DOB: Mar 28, 1971, 41 y.o.   MRN: [id number]    Subjective: Feels better this morning, no c/o of any additional abdominal pain. Has been tolerating clears per patient w/o N/V. Wants to know when she can go home.  Objective: Vital signs in last 24 hours: Temp:  [98.2 F (36.8 C)-98.5 F (36.9 C)] 98.2 F (36.8 C) (11/20 0546) Pulse Rate:  [85-86] 86  (11/20 0546) Resp:  [16-17] 17  (11/20 0546) BP: (125-145)/(88-93) 136/90 mmHg (11/20 0546) SpO2:  [97 %-100 %] 97 % (11/20 0546) Last BM Date: 11/05/12  Intake/Output from previous day:   Intake/Output this shift:    General appearance: alert, cooperative, appears stated age and no distress Chest: CTA bilaterally Cardiac: RRR Abdomen: soft, non tender, + BS, flatus BM (still having diarrhea) described as brownish. ? ABX induced. VSS,afebrile  Lab Results:   Basename 11/05/12 0510 11/04/12 0352  WBC 7.1 13.0*  HGB 10.8* 12.0  HCT 32.9* 35.6*  PLT 277 344   BMET  Basename 11/05/12 0510 11/04/12 0352  NA 139 137  K 3.3* 3.7  CL 106 101  CO2 25 23  GLUCOSE 92 113*  BUN 7 12  CREATININE 0.80 0.61  CALCIUM 8.2* 9.0   PT/INR No results found for this basename: LABPROT:2,INR:2 in the last 72 hours ABG No results found for this basename: PHART:2,PCO2:2,PO2:2,HCO3:2 in the last 72 hours  Studies/Results: No results found.  Anti-infectives: Anti-infectives     Start     Dose/Rate Route Frequency Ordered Stop   11/05/12 0800   ciprofloxacin (CIPRO) IVPB 400 mg        400 mg 200 mL/hr over 60 Minutes Intravenous Every 12 hours 11/05/12 0730     11/05/12 0800   metroNIDAZOLE (FLAGYL) IVPB 500 mg        500 mg 100 mL/hr over 60 Minutes Intravenous Every 8 hours 11/05/12 0730     11/04/12 1200   piperacillin-tazobactam (ZOSYN) IVPB 3.375 g  Status:  Discontinued        3.375 g 12.5 mL/hr over 240 Minutes Intravenous Every 8 hours 11/04/12 1135 11/05/12 0729           Assessment/Plan: s/p * No surgery found *  Patient Active Problem List  Diagnosis  . Abdominal pain  . Enteritis  . Nausea and vomiting in adult  . Blood loss anemia   1. Management per medicine 2. Continue with current diet for now 3. GI's note is seen and appreciated (Tx plan per GI)  We will continue to follow  LOS: 2 days    Golda Acre [facility] Surgery Pager # [phone]  11/06/2012

## 2012-11-06 NOTE — Progress Notes (Signed)
Agree with above. Outpatient GI work-up  No surgical problems at this time. Call us if any questions.  Imogene Burn. Georgette Dover, MD, [facility] Surgery  11/06/2012 2:23 PM

## 2012-11-06 NOTE — Progress Notes (Addendum)
[facility] PROGRESS NOTE  Assessment/Plan: Abdominal pain due to Enteritis Initially NPO, refused NG tube, as symptoms improved patient was started on clear liquid diet. Surgery recommending medical management. GI following, considering colonoscopy Thursday or as an outpatient. ? Infectious VS inflammatory disease. Continues to be on clear liquids, defer to GI in advancing patient's diet depending on colonoscopy.  Blood loss anemia MCV WNL, drop in hemoglobin may be dilutional. Concern for blood loss anemia. Anemia panel not suggestive of iron deficiency or anemia of chronic disease. She is a menstruating female.  Enteritis Continue Cipro and flagyl.  Nausea and vomiting in adult Improved.    Code Status: full Family Communication: none  Disposition Plan: Home when stable.   Consultants:  Surgery  GI  Procedures:  Ct abdomen  Antibiotics:  Cipro and Flagyl 11/05/2012  HPI/Subjective: Abdominal symptoms improved, wondering when she can go home.   Objective: Filed Vitals:   11/05/12 1443 11/05/12 1634 11/05/12 2119 11/06/12 0546  BP: 145/92 137/93 125/88 136/90  Pulse: 85  85 86  Temp: 98.5 F (36.9 C)  98.3 F (36.8 C) 98.2 F (36.8 C)  TempSrc: Oral  Oral Oral  Resp: 16  17 17   Height:      Weight:      SpO2: 97%  100% 97%   No intake or output data in the 24 hours ending 11/06/12 0800 Filed Weights   11/05/12 0552  Weight: 63.504 kg (140 lb)    Exam: Physical Exam: General: Awake, Oriented, No acute distress. HEENT: EOMI. Neck: Supple CV: S1 and S2 Lungs: Clear to ascultation bilaterally Abdomen: Soft, Nontender, Nondistended, +bowel sounds. Ext: Good pulses. Trace edema.  Data Reviewed: Basic Metabolic Panel:  Lab 11/05/12 6213 11/04/12 0352  NA 139 137  K 3.3* 3.7  CL 106 101  CO2 25 23  GLUCOSE 92 113*  BUN 7 12  CREATININE 0.80 0.61  CALCIUM 8.2* 9.0  MG -- --  PHOS -- --   Liver Function Tests:  Lab 11/04/12 0352    AST 16  ALT 13  ALKPHOS 57  BILITOT 0.3  PROT 7.3  ALBUMIN 3.9    Lab 11/04/12 0352  LIPASE 32  AMYLASE --   No results found for this basename: AMMONIA:5 in the last 168 hours CBC:  Lab 11/05/12 0510 11/04/12 0352  WBC 7.1 13.0*  NEUTROABS -- 12.2*  HGB 10.8* 12.0  HCT 32.9* 35.6*  MCV 85.7 83.8  PLT 277 344   Cardiac Enzymes: No results found for this basename: CKTOTAL:5,CKMB:5,CKMBINDEX:5,TROPONINI:5 in the last 168 hours BNP (last 3 results) No results found for this basename: PROBNP:3 in the last 8760 hours CBG: No results found for this basename: GLUCAP:5 in the last 168 hours  No results found for this or any previous visit (from the past 240 hour(s)).   Studies: No results found.  Scheduled Meds:    . ciprofloxacin  500 mg Oral BID  . enoxaparin (LOVENOX) injection  40 mg Subcutaneous Q24H  . metroNIDAZOLE  500 mg Oral Q8H  . [DISCONTINUED] ciprofloxacin  400 mg Intravenous Q12H  . [DISCONTINUED] metronidazole  500 mg Intravenous Q8H   Continuous Infusions:    . sodium chloride    . [DISCONTINUED] sodium chloride 125 mL/hr at 11/05/12 2109     [facility] A  [facility] Pager [phone]. If 8PM-8AM, please contact night-coverage at [email or web address], password [facility] 11/06/2012, 8:00 AM  LOS: 2 days   Addendum: Discussed with Dr. Ewing Schlein, about course of antibiotics, though  the CT scan showed possible enteritis, he recommended no antibiotics at the time of discharge and if patient were to have any worsening symptoms she is to contact his office for further recommendations.  If patient tolerating her diet she can likely be discharged today with followup already arranged Dr. Ewing Schlein on 12/04/2012 and will call sooner if having any worsening symptoms.  Alfio Loescher A, MD 11/06/2012, 2:08 PM

## 2012-11-06 NOTE — Progress Notes (Signed)
Levin Bacon 12:52 PM  Subjective: Patient is feeling betterAnd wants to eat and go homeAnd I offered her that versus a colonoscopy tomorrow and after our prolonged conversation she preferred an outpatient workup  Objective: Vital signs stable afebrile no acute distress abdomen is soft nontender no New labs   Assessment: Improved  Plan: Okay to advance diet and go home if well tolerated soon and will follow up with me in the office to probably set up a colonoscopy and call sooner when necessary  [facility] E

## 2012-11-06 NOTE — Progress Notes (Signed)
Patient discharged to home.  Reviewed discharge instructions and prescriptions with patient.  IV removed from left wrist.  All belongings with patient.  Care management provided a list of pcp's for patient to follow up with for her blood pressure.  Patient escorted to lobby via wheelchair by nurse tech.  Patient discharged.

## 2012-11-07 LAB — FOLATE: Folate: 17.7 ng/mL

## 2012-12-23 ENCOUNTER — Other Ambulatory Visit: Payer: Self-pay | Admitting: Gastroenterology

## 2012-12-23 DIAGNOSIS — R109 Unspecified abdominal pain: Secondary | ICD-10-CM

## 2012-12-31 ENCOUNTER — Other Ambulatory Visit: Payer: Self-pay

## 2013-04-15 ENCOUNTER — Ambulatory Visit
Admission: RE | Admit: 2013-04-15 | Discharge: 2013-04-15 | Disposition: A | Discharge status: Home / Self Care | Payer: No Typology Code available for payment source | Source: Ambulatory Visit | Attending: Gastroenterology | Admitting: Gastroenterology

## 2013-04-15 DIAGNOSIS — R109 Unspecified abdominal pain: Secondary | ICD-10-CM

## 2013-12-18 HISTORY — PX: COLONOSCOPY W/ BIOPSIES: SHX1374

## 2014-03-06 ENCOUNTER — Encounter (HOSPITAL_COMMUNITY): Payer: Self-pay | Admitting: Emergency Medicine

## 2014-03-06 ENCOUNTER — Emergency Department (HOSPITAL_COMMUNITY)
Admission: EM | Admit: 2014-03-06 | Discharge: 2014-03-06 | Disposition: A | Discharge status: Home / Self Care | Payer: BC Managed Care – PPO | Attending: Emergency Medicine | Admitting: Emergency Medicine

## 2014-03-06 DIAGNOSIS — R000 Tachycardia, unspecified: Secondary | ICD-10-CM | POA: Insufficient documentation

## 2014-03-06 DIAGNOSIS — Z79899 Other long term (current) drug therapy: Secondary | ICD-10-CM | POA: Insufficient documentation

## 2014-03-06 DIAGNOSIS — R1013 Epigastric pain: Secondary | ICD-10-CM | POA: Insufficient documentation

## 2014-03-06 DIAGNOSIS — Z8719 Personal history of other diseases of the digestive system: Secondary | ICD-10-CM | POA: Insufficient documentation

## 2014-03-06 DIAGNOSIS — R1012 Left upper quadrant pain: Secondary | ICD-10-CM | POA: Insufficient documentation

## 2014-03-06 DIAGNOSIS — R109 Unspecified abdominal pain: Secondary | ICD-10-CM

## 2014-03-06 DIAGNOSIS — R112 Nausea with vomiting, unspecified: Secondary | ICD-10-CM | POA: Insufficient documentation

## 2014-03-06 DIAGNOSIS — R1011 Right upper quadrant pain: Secondary | ICD-10-CM | POA: Insufficient documentation

## 2014-03-06 DIAGNOSIS — D72829 Elevated white blood cell count, unspecified: Secondary | ICD-10-CM | POA: Insufficient documentation

## 2014-03-06 LAB — COMPREHENSIVE METABOLIC PANEL
ALBUMIN: 4.7 g/dL (ref 3.5–5.2)
ALK PHOS: 58 U/L (ref 39–117)
ALT: 17 U/L (ref 0–35)
AST: 16 U/L (ref 0–37)
BUN: 14 mg/dL (ref 6–23)
CO2: 22 mEq/L (ref 19–32)
CREATININE: 0.69 mg/dL (ref 0.50–1.10)
Calcium: 9.8 mg/dL (ref 8.4–10.5)
Chloride: 100 mEq/L (ref 96–112)
GFR calc Af Amer: >90 mL/min (ref >90)
GFR calc non Af Amer: >90 mL/min (ref >90)
Glucose, Bld: 161 mg/dL — ABNORMAL HIGH (ref 70–99)
POTASSIUM: 4.4 meq/L (ref 3.7–5.3)
Sodium: 139 mEq/L (ref 137–147)
TOTAL PROTEIN: 8.6 g/dL — AB (ref 6.0–8.3)
Total Bilirubin: 0.4 mg/dL (ref 0.3–1.2)

## 2014-03-06 LAB — CBC WITH DIFFERENTIAL/PLATELET
BASOS ABS: 0 10*3/uL (ref 0.0–0.1)
BASOS PCT: 0 % (ref 0–1)
EOS ABS: 0 10*3/uL (ref 0.0–0.7)
Eosinophils Relative: 0 % (ref 0–5)
HCT: 39.9 % (ref 36.0–46.0)
HEMOGLOBIN: 13.5 g/dL (ref 12.0–15.0)
Lymphocytes Relative: 8 % — ABNORMAL LOW (ref 12–46)
Lymphs Abs: 1 10*3/uL (ref 0.7–4.0)
MCH: 30 pg (ref 26.0–34.0)
MCHC: 33.8 g/dL (ref 30.0–36.0)
MCV: 88.7 fL (ref 78.0–100.0)
MONOS PCT: 2 % — AB (ref 3–12)
Monocytes Absolute: 0.2 10*3/uL (ref 0.1–1.0)
NEUTROS ABS: 12.1 10*3/uL — AB (ref 1.7–7.7)
NEUTROS PCT: 90 % — AB (ref 43–77)
Platelets: 343 10*3/uL (ref 150–400)
RBC: 4.5 MIL/uL (ref 3.87–5.11)
RDW: 13.9 % (ref 11.5–15.5)
WBC: 13.5 10*3/uL — ABNORMAL HIGH (ref 4.0–10.5)

## 2014-03-06 LAB — LIPASE, BLOOD: LIPASE: 49 U/L (ref 11–59)

## 2014-03-06 MED ORDER — ONDANSETRON HCL 4 MG/2ML IJ SOLN
4.0000 mg | Freq: Once | INTRAMUSCULAR | Status: AC
  Administered 2014-03-06: 4 mg via INTRAVENOUS
  Filled 2014-03-06: qty 2

## 2014-03-06 MED ORDER — DICYCLOMINE HCL 20 MG PO TABS: 20.0000 mg | ORAL_TABLET | Freq: Two times a day (BID) | ORAL | Status: DC

## 2014-03-06 MED ORDER — OXYCODONE-ACETAMINOPHEN 5-325 MG PO TABS: 1.0000 | ORAL_TABLET | ORAL | Status: DC | PRN

## 2014-03-06 MED ORDER — HYDROMORPHONE HCL PF 1 MG/ML IJ SOLN
1.0000 mg | Freq: Once | INTRAMUSCULAR | Status: AC
  Administered 2014-03-06: 1 mg via INTRAVENOUS
  Filled 2014-03-06: qty 1

## 2014-03-06 MED ORDER — SODIUM CHLORIDE 0.9 % IV BOLUS (SEPSIS)
1000.0000 mL | Freq: Once | INTRAVENOUS | Status: AC
  Administered 2014-03-06: 1000 mL via INTRAVENOUS

## 2014-03-06 MED ORDER — PROMETHAZINE HCL 25 MG PO TABS: 25.0000 mg | ORAL_TABLET | Freq: Four times a day (QID) | ORAL | Status: DC | PRN

## 2014-03-06 MED ORDER — PROMETHAZINE HCL 25 MG/ML IJ SOLN
25.0000 mg | Freq: Once | INTRAMUSCULAR | Status: AC
  Administered 2014-03-06: 25 mg via INTRAVENOUS
  Filled 2014-03-06: qty 1

## 2014-03-06 NOTE — ED Provider Notes (Signed)
CSN: [id number]     Arrival date & time 03/06/14  0335 History   First MD Initiated Contact with Patient 03/06/14 [phone]     Chief Complaint  Patient presents with  . Emesis     (Consider location/radiation/quality/duration/timing/severity/associated sxs/prior Treatment) HPI Comments: 43 year old female with a history of enteritis and an admission to the hospital in 2013 who has had problems with intermittent nausea and abdominal pain since that time. 2 weeks ago she developed nausea vomiting and diarrhea which had resolved spontaneously after several days. This evening around 8:00 PM she developed recurrent nausea and vomiting. This has happened multiple times this evening, it is not associated with diarrhea today nor is it associated with fevers, chills, back pain, chest pain, cough or shortness of breath. She denies any blood in her stools or blood in her emesis. She has tried oral dissolvable Zofran with no improvement at home. She states that she had an outpatient colonoscopy performed by gastroenterology Dr. Watt Climes which showed no acute findings, she states that she was tested for Crohn's disease and ulcerative colitis and it was negative. He has never had abdominal surgery  Patient is a 43 y.o. female presenting with vomiting. The history is provided by the patient and medical records.  Emesis   History reviewed. No pertinent past medical history. Past Surgical History  Procedure Laterality Date  . Other surgical history      heel surgery  . Wisdom teeth extracted    . Right heel surgery      with plates and screws   History reviewed. No pertinent family history. History  Substance Use Topics  . Smoking status: Never Smoker   . Smokeless tobacco: Never Used  . Alcohol Use: No   OB History   Grav Para Term Preterm Abortions TAB SAB Ect Mult Living                 Review of Systems  Gastrointestinal: Positive for vomiting.  All other systems reviewed and are  negative.      Allergies  Review of patient's allergies indicates no known allergies.  Home Medications   Current Outpatient Rx  Name  Route  Sig  Dispense  Refill  . ondansetron (ZOFRAN) 4 MG tablet   Oral   Take 1 tablet (4 mg total) by mouth every 6 (six) hours as needed for nausea.   20 tablet   0   . dicyclomine (BENTYL) 20 MG tablet   Oral   Take 1 tablet (20 mg total) by mouth 2 (two) times daily.   20 tablet   0   . oxyCODONE-acetaminophen (PERCOCET) 5-325 MG per tablet   Oral   Take 1 tablet by mouth every 4 (four) hours as needed.   20 tablet   0   . promethazine (PHENERGAN) 25 MG tablet   Oral   Take 1 tablet (25 mg total) by mouth every 6 (six) hours as needed for nausea or vomiting.   12 tablet   0    BP 161/101  Pulse 117  Temp(Src) 98.1 F (36.7 C) (Oral)  Resp 20  Ht 5' 8"  (1.727 m)  Wt 145 lb (65.772 kg)  BMI 22.05 kg/m2  SpO2 97% Physical Exam  Nursing note and vitals reviewed. Constitutional: She appears well-developed and well-nourished.  Uncomfortable appearing  HENT:  Head: Normocephalic and atraumatic.  Mouth/Throat: Oropharynx is clear and moist. No oropharyngeal exudate.  Eyes: Conjunctivae and EOM are normal. Pupils are equal, round, and reactive  to light. Right eye exhibits no discharge. Left eye exhibits no discharge. No scleral icterus.  Neck: Normal range of motion. Neck supple. No JVD present. No thyromegaly present.  Cardiovascular: Regular rhythm, normal heart sounds and intact distal pulses.  Exam reveals no gallop and no friction rub.   No murmur heard. Mild tachycardia  Pulmonary/Chest: Effort normal and breath sounds normal. No respiratory distress. She has no wheezes. She has no rales.  Abdominal: Soft. Bowel sounds are normal. She exhibits no distension and no mass. There is tenderness ( Tenderness across the left upper, right upper and epigastrium. No lower abdominal tenderness, no guarding or masses).  No tympanitic  sounds to percussion, no peritoneal signs  Musculoskeletal: Normal range of motion. She exhibits no edema and no tenderness.  Lymphadenopathy:    She has no cervical adenopathy.  Neurological: She is alert. Coordination normal.  Skin: Skin is warm and dry. No rash noted. No erythema.  Psychiatric: She has a normal mood and affect. Her behavior is normal.    ED Course  Procedures (including critical care time) Labs Review Labs Reviewed  CBC WITH DIFFERENTIAL - Abnormal; Notable for the following:    WBC 13.5 (*)    Neutrophils Relative % 90 (*)    Neutro Abs 12.1 (*)    Lymphocytes Relative 8 (*)    Monocytes Relative 2 (*)    All other components within normal limits  COMPREHENSIVE METABOLIC PANEL - Abnormal; Notable for the following:    Glucose, Bld 161 (*)    Total Protein 8.6 (*)    All other components within normal limits  LIPASE, BLOOD  URINALYSIS, ROUTINE W REFLEX MICROSCOPIC  POC URINE PREG, ED   Imaging Review No results found.    MDM   Final diagnoses:  Nausea and vomiting  Abdominal cramping  Leukocytosis    The patient is actively vomiting during my exam, her abdomen is not distended nor does it appear to be consistent with a small bowel obstruction. Will obtain labs, urinalysis, symptomatic the patient and reexamined.  Pt reexamined and feels much better after IVF and phenergan - PO trial, home with meds.  Imaging not indicated at this time.  Labs do not suggest pancreatitis, cholecystitis or hepatitis.  PT unable to give urine sample but had no dysuria, hematuria or lower abd pain or tenderness and no CVA or back complaint / tenderness.  Meds given in ED:  Medications  promethazine (PHENERGAN) injection 25 mg (25 mg Intravenous Given 03/06/14 0503)  sodium chloride 0.9 % bolus 1,000 mL (0 mLs Intravenous Stopped 03/06/14 0633)  HYDROmorphone (DILAUDID) injection 1 mg (1 mg Intravenous Given 03/06/14 0503)  ondansetron (ZOFRAN) injection 4 mg (4 mg  Intravenous Given 03/06/14 0551)    New Prescriptions   DICYCLOMINE (BENTYL) 20 MG TABLET    Take 1 tablet (20 mg total) by mouth 2 (two) times daily.   OXYCODONE-ACETAMINOPHEN (PERCOCET) 5-325 MG PER TABLET    Take 1 tablet by mouth every 4 (four) hours as needed.   PROMETHAZINE (PHENERGAN) 25 MG TABLET    Take 1 tablet (25 mg total) by mouth every 6 (six) hours as needed for nausea or vomiting.      Johnna Acosta, MD 03/06/14 [phone]

## 2014-03-06 NOTE — Discharge Instructions (Signed)
Your tests showed an elevated blood count but no other significant findings - you should drink plenty of fluids, and use the medicine for nausea including zofran or phenergan.  The bentyl may help with abdominal cramping or pain - percocet for severe pain  Return to the ER if symptoms worsen.  Please call your doctor for a followup appointment within 24-48 hours. When you talk to your doctor please let them know that you were seen in the emergency department and have them acquire all of your records so that they can discuss the findings with you and formulate a treatment plan to fully care for your new and ongoing problems.   Emergency Department Resource Guide 1) Find a Doctor and Pay Out of Pocket Although you won't have to find out who is covered by your insurance plan, it is a good idea to ask around and get recommendations. You will then need to call the office and see if the doctor you have chosen will accept you as a new patient and what types of options they offer for patients who are self-pay. Some doctors offer discounts or will set up payment plans for their patients who do not have insurance, but you will need to ask so you aren't surprised when you get to your appointment.  2) Contact Your Local Health Department Not all health departments have doctors that can see patients for sick visits, but many do, so it is worth a call to see if yours does. If you don't know where your local health department is, you can check in your phone book. The CDC also has a tool to help you locate your state's health department, and many state websites also have listings of all of their local health departments.  3) Find a [facility] If your illness is not likely to be very severe or complicated, you may want to try a walk in clinic. These are popping up all over the country in pharmacies, drugstores, and shopping centers. They're usually staffed by nurse practitioners or physician assistants that have  been trained to treat common illnesses and complaints. They're usually fairly quick and inexpensive. However, if you have serious medical issues or chronic medical problems, these are probably not your best option.  No Primary Care Doctor: - Call Health Connect at  [phone] - they can help you locate a primary care doctor that  accepts your insurance, provides certain services, etc. - Physician Referral Service- [phone]  Chronic Pain Problems: Organization         Address  Phone   Notes  [facility]  [phone] Patients need to be referred by their primary care doctor.   Medication Assistance: Organization         Address  Phone   Notes  [facility] Medication [facility] [address] [place], [place] [address] [phone] --Must be a resident of [facility] -- Must have NO insurance coverage whatsoever (no Medicaid/ Medicare, etc.) -- The pt. MUST have a primary care doctor that directs their care regularly and follows them in the community   MedAssist  [phone]   [employer]  [phone]    Agencies that provide inexpensive medical care: Organization         Address  Phone   Notes  [place]  [phone]   [facility]    [phone]   [facility] [address]  [place],  [address] [phone]   [place] [address] [place] [phone]   Planned Parenthood    [phone]   [facility]    [phone]   [place] and [place] [address], [place] Phone:  [phone], Fax:  [phone] Hours of Operation:  9 am - 6 pm, M-F.  Also accepts Medicaid/Medicare and self-pay.  [facility] for [place] [address], [address], [place] Phone: [phone], Fax: [phone]. Hours of Operation:  8:30 am - 5:30 pm, M-F.  Also accepts Medicaid and  self-pay.  [facility] [place] [address], [place] Phone: [phone]   [place], [place], [place] [phone], Ext. [phone] Mondays & Thursdays: 7-9 AM.  First 15 patients are seen on a first come, first serve basis.    [place] Providers:  Organization         Address  Phone   Notes  [facility] [address], [place] [phone] Also accepts self-pay patients.  [facility] [address], [place]  [phone]   [place], [address], [place] [phone]   [facility] [address], [place] [phone]   Lucianne Lei [address], [place]   [phone] Only accepts [place] Access [place] patients after they have their name applied to their card.   Self-Pay (no insurance) in [facility]:  Organization         Address  Phone   Notes  Sickle Cell Patients, [facility] [place] [phone]   [facility] [place] [phone]   [facility] [place]  [place], [place], [place] [phone]   [facility]/Dr. Osei-Bonsu  [address] [place] or [address], [place] [phone] Phone number for both [place] and [place] locations is the same.  [facility] and [facility] [address], [place] [phone]   [facility] [address] [place] or [address] [phone]   [facility] [address] [place] [phone], phone; [phone], fax Sees patients 1st and 3rd Saturday of every month.  Must not qualify for public or private insurance (i.e. Medicaid, Medicare, [place] Health Choice, Veterans' Benefits)  Household income  should be no more than 200% of the poverty level The clinic cannot treat you if you are pregnant or think you are pregnant  Sexually transmitted diseases are not treated at the clinic.    Dental Care: Organization         Address  Phone  Notes  [facility] Department of [facility] [place] [phone] Accepts children up to age 31 who are enrolled in [place] or [place]; pregnant women with a Medicaid card; and children who have applied for Medicaid or  Health Choice, but were declined, whose parents can pay a reduced fee at time of service.  [facility] Department of [facility]  [address], [place] [phone] Accepts children up to age 58 who are enrolled in [place] or [place];  pregnant women with a Medicaid card; and children who have applied for Medicaid or [place] Health Choice, but were declined, whose parents can pay a reduced fee at time of service.  [facility] Adult [facility] Access PROGRAM  [place] [phone] Patients are seen by appointment only. Walk-ins are not accepted. [place] will see patients 15 years of age and older. Monday - Tuesday (8am-5pm) Most Wednesdays (8:30-5pm) $30 per visit, cash only  [facility] Adult Dental Access PROGRAM  [address], [facility] [phone] Patients are seen by appointment only. Walk-ins are not accepted. [facility] will see patients 36 years of age and older. One Wednesday Evening (Monthly: Volunteer Based).  $30 per visit, cash only  [place]  [phone] for adults; Children under age 94, call Graduate Pediatric Dentistry at [phone]. Children aged 40-14, please call [phone] to request a pediatric application.  Dental services are provided in all areas of dental care including fillings, crowns and bridges, complete and partial dentures, implants, gum treatment,  root canals, and extractions. Preventive care is also provided. Treatment is provided to both adults and children. Patients are selected via a lottery and there is often a waiting list.   [facility] [address], [place]  [phone] [email or web address]   [facility] [address] [place], [place] [phone], Ext. [phone] Second and Fourth Thursday of each month, opens at 6:30 AM; Clinic ends at 9 AM.  Patients are seen on a first-come first-served basis, and a limited number are seen during each clinic.   [facility]  [address] Hillard Danker [place], [place] [phone]   Eligibility Requirements You must have lived in [place], [place], or [place] counties for at least the last three months.   You cannot be eligible for state or federal sponsored [employer], including [employer], [place], or [employer].   You generally cannot be eligible for healthcare insurance through your employer.    How to apply: Eligibility screenings are held every Tuesday and Wednesday afternoon from 1:00 pm until 4:00 pm. You do not need an appointment for the interview!  [facility] [address], [place], [place]   [place]  [place] Department  [place]  [phone]    Behavioral Health Resources in the Community: Intensive Outpatient Programs Organization         Address  Phone  Notes  [place]. [address], [place], [place] [phone]   [facility] Outpatient [address], [place], [place]   ADS: Alcohol & Drug Svcs [address], [place], [place]   [place] [address]  [place], [place] or [phone]   Substance Abuse Resources Organization         Address  Phone  Notes  Alcohol and Drug Services   [phone]   [place]  [phone]   [facility] [place]   Chinita Pester  [phone]   Residential & Outpatient Substance Abuse Program  [phone]   Psychological Services Organization         Address  Phone  Notes  [facility]  [place]  [phone]   [facility] [address], [place] or [phone]    Mobile  Crisis Teams Organization         Address  Phone  Notes  [facility], Mobile Crisis Care Unit  [phone]   [facility] Psychotherapeutic Services  [address] [place], [place]   [facility] [address] [place] [phone]    Self-Help/Support Groups Organization         Address  Phone             Notes  [place]. of [place] - variety of support groups  [place] Call for more information  Narcotics Anonymous (NA), [facility] [address], [employer] [place]  2 meetings at this location   Special educational needs teacher         Address  Phone  Notes  [facility] Residential Treatment [place],    [place]  [phone])[phone]   [facility]  [address] [place] [phone], [place], [place]   [place], [place] [phone] Admissions: 8am-3pm M-F  Incentives Substance [place] [address]    [place], [place] [phone]   [facility] [address] [place], [place], [place]   [facility] [address],  [place], [place]   [facility] Programs - Intensive Outpatient [address], [place], [place]   [facility] ([place].) [place].,  [place], [place] [phone] or [phone]   [facility] Services (RTS) [address] [place], [place] Accepts Medicaid  [facility] [place] [address],  [place] [phone] Substance Abuse/Addiction Treatment   [facility] Organization         Address  Phone  Notes  [facility]  [phone]   Domenic Schwab, PhD [address] Arlis Porta [place], [place]   [phone] or [phone]   [place], [place] [phone]   [facility] [address] [place], [place] [phone] Insurance/Medicaid/sponsorship through [facility] and Families [address], [address] [place]                                    [place], [place] [phone] [place] [address], [place] [phone]    Dr. Adele Schilder  [phone]   [facility] [place] [facility] Dept. 1) [address] [place] 2) [place] 3)  [place] [address], [place] [phone]  [phone]   [place] [phone] or [phone] (After Hours)

## 2014-03-06 NOTE — ED Notes (Signed)
Pt complains of vomiting since 8pm, currently in triage sticking her finger down her throat stating "I cant quit throwing up"

## 2014-03-06 NOTE — ED Notes (Signed)
Patient given cranberry juice for PO trial

## 2014-04-28 ENCOUNTER — Encounter: Payer: Self-pay | Admitting: Internal Medicine

## 2014-05-06 ENCOUNTER — Encounter: Payer: Self-pay | Admitting: Internal Medicine

## 2014-05-08 ENCOUNTER — Encounter: Payer: Self-pay | Admitting: Internal Medicine

## 2014-05-08 ENCOUNTER — Other Ambulatory Visit (INDEPENDENT_AMBULATORY_CARE_PROVIDER_SITE_OTHER): Payer: BC Managed Care – PPO

## 2014-05-08 ENCOUNTER — Ambulatory Visit (INDEPENDENT_AMBULATORY_CARE_PROVIDER_SITE_OTHER): Payer: BC Managed Care – PPO | Admitting: Internal Medicine

## 2014-05-08 VITALS — BP 112/64 | HR 70 | Ht 68.0 in | Wt 146.0 lb

## 2014-05-08 DIAGNOSIS — R933 Abnormal findings on diagnostic imaging of other parts of digestive tract: Secondary | ICD-10-CM

## 2014-05-08 DIAGNOSIS — K529 Noninfective gastroenteritis and colitis, unspecified: Secondary | ICD-10-CM

## 2014-05-08 DIAGNOSIS — K5289 Other specified noninfective gastroenteritis and colitis: Secondary | ICD-10-CM

## 2014-05-08 DIAGNOSIS — R1033 Periumbilical pain: Secondary | ICD-10-CM

## 2014-05-08 DIAGNOSIS — R112 Nausea with vomiting, unspecified: Secondary | ICD-10-CM

## 2014-05-08 LAB — CBC
HEMATOCRIT: 40.6 % (ref 36.0–46.0)
HEMOGLOBIN: 13.7 g/dL (ref 12.0–15.0)
MCHC: 33.8 g/dL (ref 30.0–36.0)
MCV: 88 fl (ref 78.0–100.0)
PLATELETS: 346 10*3/uL (ref 150.0–400.0)
RBC: 4.61 Mil/uL (ref 3.87–5.11)
RDW: 12.5 % (ref 11.5–15.5)
WBC: 6 10*3/uL (ref 4.0–10.5)

## 2014-05-08 LAB — IGA: IgA: 235 mg/dL (ref 68–378)

## 2014-05-08 LAB — COMPREHENSIVE METABOLIC PANEL
ALT: 15 U/L (ref 0–35)
AST: 17 U/L (ref 0–37)
Albumin: 4.1 g/dL (ref 3.5–5.2)
Alkaline Phosphatase: 57 U/L (ref 39–117)
BUN: 9 mg/dL (ref 6–23)
CALCIUM: 9.2 mg/dL (ref 8.4–10.5)
CHLORIDE: 106 meq/L (ref 96–112)
CO2: 27 meq/L (ref 19–32)
CREATININE: 0.9 mg/dL (ref 0.4–1.2)
GFR: 73.68 mL/min (ref >60.00)
Glucose, Bld: 96 mg/dL (ref 70–99)
Potassium: 4.5 mEq/L (ref 3.5–5.1)
Sodium: 140 mEq/L (ref 135–145)
Total Bilirubin: 0.3 mg/dL (ref 0.2–1.2)
Total Protein: 8 g/dL (ref 6.0–8.3)

## 2014-05-08 LAB — HIGH SENSITIVITY CRP: CRP, High Sensitivity: 1.24 mg/L (ref 0.000–5.000)

## 2014-05-08 NOTE — Patient Instructions (Addendum)
You have been scheduled for an MR/Enterography at [facility] on 05/19/2014 At 5:00pm  Please check in at radiology 15 minutes prior to you appointment.  Please allow 2 hours for this test. Have nothing to eat or drink 4 hours prior to your visit   Your physician has requested that you go to the basement for the following lab work before leaving today: CBC, CMP, Celiac Panel, IBD, TPMT  Please follow up with Dr. Hilarie Fredrickson in 6-8 weeks in office  You have been given a low fiber diet

## 2014-05-08 NOTE — Progress Notes (Signed)
Patient ID: Meghan Charles, female   DOB: [birth year], 43 y.o.   MRN: [id number] HPI: Meghan Charles is a 43 yo female with PMH of anxiety, gallbladder polyp and intermittent episodic abdominal pain who is seen for evaluation of the latter.  She is her on recommendation from MD at [facility].  She is alone today.  She reports 18 months to 2 years of intermittent abdominal symptoms. She describes this as episodic mid abdominal pain which feels like a "wrench" which tightens and then releases. When this is most severe it is associated with violent nausea and vomiting. She reports she knows an episode is over when she has severe diarrhea. No other diarrhea or change in bowel habit. No rectal bleeding, blood in her stool or melena. Episodes can be associated with low-grade fever and chills. She reports abdominal bloating during these episodes. Episodes can last 2-7 days. Initially they would happen approximately once per year but over the last 4 months she's had 2-3 episodes. This has previously been worked up after hospitalization in November 2013 by Dr. Clarene Essex.  She reports she had a colonoscopy which was normal. Most recently she was seen at urgent care and had an abdominal CT which was performed on 04/28/2014.  Today she is feeling fairly well but she does have crampy abdominal pain when she eats. She is trying to watch what she eats but is frustrated because she doesn't have a trigger. She has even seen allergy and immunology and does not have any food allergies.  No family history of IBD. Her maternal grandmother had colon cancer at age 4.  She denies mouth ulcers, rash, joint inflammation or pain, and no ocular pain or inflammation  No history of prior abdominal surgery.  Past Medical History  Diagnosis Date  . Gallbladder polyp   . Anxiety     Past Surgical History  Procedure Laterality Date  . Other surgical history      heel surgery  . Wisdom teeth extracted    . Right heel surgery       with plates and screws    No current outpatient prescriptions on file.   No current facility-administered medications for this visit.    No Known Allergies  Family History  Problem Relation Age of Onset  . Colon cancer Maternal Grandmother 45    History  Substance Use Topics  . Smoking status: Never Smoker   . Smokeless tobacco: Never Used  . Alcohol Use: Yes     Comment: 3 per week    ROS: As per history of present illness, otherwise negative  BP 112/64  Pulse 70  Ht 5\' 8"  (1.727 m)  Wt 146 lb (66.225 kg)  BMI 22.20 kg/m2 Constitutional: Well-developed and well-nourished. No distress. HEENT: Normocephalic and atraumatic. Oropharynx is clear and moist. No oropharyngeal exudate. Conjunctivae are normal.  No scleral icterus. Neck: Neck supple. Trachea midline. Cardiovascular: Normal rate, regular rhythm and intact distal pulses. No M/R/G Pulmonary/chest: Effort normal and breath sounds normal. No wheezing, rales or rhonchi. Abdominal: Soft, mid abdominal tenderness which is mild without rebound or guarding, nondistended. Bowel sounds active throughout. There are no masses palpable. No hepatosplenomegaly. Extremities: no clubbing, cyanosis, or edema Lymphadenopathy: No cervical adenopathy noted. Neurological: Alert and oriented to person place and time. Skin: Skin is warm and dry. No rashes noted. Psychiatric: Normal mood and affect. Behavior is normal.  RELEVANT LABS AND IMAGING: CBC    Component Value Date/Time   WBC 13.5* 03/06/2014 0417  RBC 4.50 03/06/2014 0417   RBC 3.91 11/05/2012 0837   HGB 13.5 03/06/2014 0417   HCT 39.9 03/06/2014 0417   PLT 343 03/06/2014 0417   MCV 88.7 03/06/2014 0417   MCH 30.0 03/06/2014 0417   MCHC 33.8 03/06/2014 0417   RDW 13.9 03/06/2014 0417   LYMPHSABS 1.0 03/06/2014 0417   MONOABS 0.2 03/06/2014 0417   EOSABS 0.0 03/06/2014 0417   BASOSABS 0.0 03/06/2014 0417    CMP     Component Value Date/Time   NA 139 03/06/2014 0417   K  4.4 03/06/2014 0417   CL 100 03/06/2014 0417   CO2 22 03/06/2014 0417   GLUCOSE 161* 03/06/2014 0417   BUN 14 03/06/2014 0417   CREATININE 0.69 03/06/2014 0417   CALCIUM 9.8 03/06/2014 0417   PROT 8.6* 03/06/2014 0417   ALBUMIN 4.7 03/06/2014 0417   AST 16 03/06/2014 0417   ALT 17 03/06/2014 0417   ALKPHOS 58 03/06/2014 0417   BILITOT 0.4 03/06/2014 0417   GFRNONAA >90 03/06/2014 0417   GFRAA >90 03/06/2014 0417   Clinical Data:  Abdominal pain with nausea and vomiting.   COMPLETE ABDOMINAL ULTRASOUND -- Jan 2014   Comparison:  CT scan of the abdomen dated 11/04/2012.  The   Findings:   Gallbladder:  There is a single 4 mm polyp in the gallbladder. Otherwise, normal.  Negative sonographic Murphy's sign.   Common bile duct:  Normal.  4.8 mm in diameter.   Liver:  Normal.   IVC:  Normal.   Pancreas:  Normal.   Spleen:  Normal.  6.3 cm in length.   Right Kidney:  Normal.  11.3 cm in length.   Left Kidney:  Normal.  10.8 cm in length.   Abdominal aorta:  Normal.   IMPRESSION: Single small polyp in the gallbladder.  Otherwise, normal exam.   CT ABDOMEN AND PELVIS WITH CONTRAST -- Oct 2013   Technique:  Multidetector CT imaging of the abdomen and pelvis was performed following the standard protocol during bolus administration of intravenous contrast.   Contrast: 156mL OMNIPAQUE IOHEXOL 300 MG/ML  SOLN   Comparison: 11/04/2012 radiograph, 01/23/2011 CT.  09/13/2009 unenhanced CT.   Findings: Lung bases are clear.  Heart size within normal limits. Breast prostheses. No pleural or pericardial effusion.   Unremarkable liver, biliary system, spleen, pancreas, left adrenal gland.  2.7 cm right adrenal nodule is incompletely characterized however grossly similar to prior. Favored to represent an adenoma when corresponded to the 2010 unenhanced CT.   A couple tiny renal hypodensities, too small further characterize. Otherwise, symmetric renal enhancement.  No hydronephrosis  or hydroureter.   The colon is relatively decompressed.  Normal appendix. Proximal most and distal small bowel loops are decompressed.  However, there are dilated mid small bowel loops with air-fluid levels, measuring up to 3.4 cm.   There is a transition point in the pelvis where a focal circumferentially thickened segment is seen on series 2 image 71. Small amount of free fluid within the pelvis dependently.  No free intraperitoneal air.  No lymphadenopathy.   Normal caliber aorta and branch vessels.   Thin-walled bladder.  Unremarkable CT appearance to the uterus and adnexa.   No acute osseous finding.   IMPRESSION: There are dilated small bowel loops with transition/delayed transit through a focally thickened segment of small bowel in the pelvis. This may reflect an enteritis as can be seen with a focal inflammatory, infectious, or ischemic process.  An underlying mass is  not excluded. Recommend GI consultation.   Small amount of free fluid within the pelvis is nonspecific.   CT scan abdomen and pelvis with contrast dated 04/28/2014 --Findings abnormal mid to distal small bowel loops. Several loops appear dilated with thickened wall. Consider inflammatory bowel disease. Contrast is present throughout the bowel extending through to the rectum. Terminal ileum is normal appearing. Unremarkable appearance of the colon. Normal appendix. Small hiatal hernia. No evidence of free air or free fluid. Normal appearing liver, spleen, pancreas, adrenal glands, kidneys and gallbladder. No significant adenopathy. Bones intact. Lung bases clear. Bilateral breast implants.  ASSESSMENT/PLAN: 43 yo female with PMH of anxiety, gallbladder polyp and intermittent episodic abdominal pain who is seen for evaluation of the latter.   1.  Abd pain/abnl GI imaging -- her overall symptoms and previous and recent imaging are highly suspicious for Crohn's enteritis. Her episodes sound like partial small  bowel obstructions which are relieved by nausea and vomiting. The diarrhea that follows likely occurs once the obstruction resolves. She has had a previous normal colonoscopy and I have requested these records. We had a long discussion today regarding Crohn's disease and I have recommended that she visit the website [email or web address].  I've also recommended a low residue diet. I would like to proceed with further more supportive imaging and to evaluate for stricture with MR enterography. I will check CBC, CMP, celiac panel, CRP, IBD extended panel and TPMT.  She is given a handout comparing maintenance medications and Crohn's disease should this be eventual diagnosis. She has Phenergan suppositories and sublingual Zofran at home and she can use this as directed and as needed. If labs and imaging support Crohn's disease my first treatment would be ileal release budesonide 9 mg daily followed by a maintenance therapy with either azathioprine or biologic.  We did discuss how she may require surgery if definitive stricture is found, and it is possible that even with decreased inflammation/treated Crohn's disease she may have a flow limiting small bowel stricture.  Return in 6-8 weeks, further recs after imaging and labs

## 2014-05-12 LAB — TISSUE TRANSGLUTAMINASE, IGA: Tissue Transglutaminase Ab, IgA: 5.5 U/mL (ref <20)

## 2014-05-12 LAB — IBD EXPANDED PANEL
ACCA: 18 U (ref 0–90)
ALCA: 8 U (ref 0–60)
AMCA: 50 U (ref 0–100)
ATYPICAL PANCA: NEGATIVE
GASCA: 11 U (ref 0–50)

## 2014-05-15 LAB — THIOPURINE METHYLTRANSFERASE (TPMT), RBC: Thiopurine Methyltransferase, RBC: 20 (ref >12)

## 2014-05-18 DIAGNOSIS — K635 Polyp of colon: Secondary | ICD-10-CM

## 2014-05-18 HISTORY — DX: Polyp of colon: K63.5

## 2014-05-19 ENCOUNTER — Ambulatory Visit (HOSPITAL_COMMUNITY)
Admission: RE | Admit: 2014-05-19 | Discharge: 2014-05-19 | Disposition: A | Discharge status: Home / Self Care | Payer: BC Managed Care – PPO | Source: Ambulatory Visit | Attending: Internal Medicine | Admitting: Internal Medicine

## 2014-05-19 ENCOUNTER — Telehealth: Payer: Self-pay | Admitting: Internal Medicine

## 2014-05-19 DIAGNOSIS — R933 Abnormal findings on diagnostic imaging of other parts of digestive tract: Secondary | ICD-10-CM

## 2014-05-19 DIAGNOSIS — K529 Noninfective gastroenteritis and colitis, unspecified: Secondary | ICD-10-CM

## 2014-05-19 DIAGNOSIS — K5289 Other specified noninfective gastroenteritis and colitis: Secondary | ICD-10-CM | POA: Insufficient documentation

## 2014-05-19 MED ORDER — GADOBENATE DIMEGLUMINE 529 MG/ML IV SOLN
13.0000 mL | Freq: Once | INTRAVENOUS | Status: AC | PRN
  Administered 2014-05-19: 13 mL via INTRAVENOUS

## 2014-05-19 NOTE — Telephone Encounter (Signed)
Noted will await imaging

## 2014-05-19 NOTE — Telephone Encounter (Signed)
Patient reports that she had another episode of pain last night.  Was a little different in that she also had pain and tenderness on her right side, the pain is usually only in the center of her abdomen.  She is scheduled for MRI this evening.  She states all symptoms have resolved this am.  She is advised that we will call with the results of the MRI as soon as Dr. Hilarie Fredrickson reviews.  She will call back for any additional complaints/concerns until MRI results are available

## 2014-05-20 ENCOUNTER — Telehealth: Payer: Self-pay | Admitting: Internal Medicine

## 2014-05-20 NOTE — Telephone Encounter (Signed)
Patient advised that we will call with the results as soon as Dr. Hilarie Fredrickson has a chance to review

## 2014-05-21 ENCOUNTER — Other Ambulatory Visit: Payer: Self-pay

## 2014-05-21 ENCOUNTER — Encounter: Payer: Self-pay | Admitting: Internal Medicine

## 2014-05-21 ENCOUNTER — Telehealth: Payer: Self-pay | Admitting: Internal Medicine

## 2014-05-21 DIAGNOSIS — R933 Abnormal findings on diagnostic imaging of other parts of digestive tract: Secondary | ICD-10-CM

## 2014-05-21 NOTE — Telephone Encounter (Signed)
I spoke with the patient's mother and updated her on he results. All questions answered.   She will call back for any additional questions or concerns.

## 2014-05-22 ENCOUNTER — Ambulatory Visit (AMBULATORY_SURGERY_CENTER): Payer: BC Managed Care – PPO | Admitting: *Deleted

## 2014-05-22 VITALS — Ht 68.0 in | Wt 146.0 lb

## 2014-05-22 DIAGNOSIS — R933 Abnormal findings on diagnostic imaging of other parts of digestive tract: Secondary | ICD-10-CM

## 2014-05-22 MED ORDER — MOVIPREP 100 G PO SOLR: ORAL | Status: DC

## 2014-05-22 NOTE — Progress Notes (Signed)
No egg or soy allergy  No home oxygen use or problems with anesthesia  No medications for weight loss taken

## 2014-05-26 ENCOUNTER — Ambulatory Visit (AMBULATORY_SURGERY_CENTER): Payer: BC Managed Care – PPO | Admitting: Internal Medicine

## 2014-05-26 ENCOUNTER — Encounter: Payer: Self-pay | Admitting: Internal Medicine

## 2014-05-26 VITALS — BP 109/77 | HR 86 | Temp 98.1°F | Resp 15 | Ht 68.0 in | Wt 146.0 lb

## 2014-05-26 DIAGNOSIS — R933 Abnormal findings on diagnostic imaging of other parts of digestive tract: Secondary | ICD-10-CM

## 2014-05-26 DIAGNOSIS — D126 Benign neoplasm of colon, unspecified: Secondary | ICD-10-CM

## 2014-05-26 DIAGNOSIS — R109 Unspecified abdominal pain: Secondary | ICD-10-CM

## 2014-05-26 DIAGNOSIS — K5289 Other specified noninfective gastroenteritis and colitis: Secondary | ICD-10-CM

## 2014-05-26 DIAGNOSIS — K529 Noninfective gastroenteritis and colitis, unspecified: Secondary | ICD-10-CM

## 2014-05-26 MED ORDER — SODIUM CHLORIDE 0.9 % IV SOLN: 500.0000 mL | INTRAVENOUS | Status: DC

## 2014-05-26 NOTE — Patient Instructions (Signed)

## 2014-05-26 NOTE — Op Note (Signed)
[facility]  [address] [place], [id number]   COLONOSCOPY PROCEDURE REPORT  PATIENT: Meghan Charles, Meghan Charles  MR#: [id number] BIRTHDATE: [birth year] , 42  yrs. old GENDER: Female ENDOSCOPIST: Jerene Bears, MD PROCEDURE DATE:  05/26/2014 PROCEDURE:   Colonoscopy with snare polypectomy First Screening Colonoscopy - Avg.  risk and is 50 yrs.  old or older - No.  Prior Negative Screening - Now for repeat screening. N/A  History of Adenoma - Now for follow-up colonoscopy & has been > or = to 3 yrs.  N/A  Polyps Removed Today? Yes. ASA CLASS:   Class II INDICATIONS:Abdominal pain and an abnormal MRI. MEDICATIONS: MAC sedation, administered by CRNA and propofol (Diprivan) 438m IV  DESCRIPTION OF PROCEDURE:   After the risks benefits and alternatives of the procedure were thoroughly explained, informed consent was obtained.  A digital rectal exam revealed no rectal mass.   The LB [id number] endoscope was introduced through the anus and advanced to the terminal ileum which was intubated for a short distance. No adverse events experienced.   The quality of the prep was good, using MoviPrep  The instrument was then slowly withdrawn as the colon was fully examined.   COLON FINDINGS: The mucosa appeared normal in the terminal ileum. The terminal ileum was examined for approximately 15-20 cm.  A sessile polyp measuring 10 mm in size was found at the cecum.  A polypectomy was performed with a cold snare.  The resection was complete and the polyp tissue was completely retrieved.   A sessile polyp measuring 3 mm in size was found in the distal sigmoid colon. A polypectomy was performed with a cold snare.  The resection was complete and the polyp tissue was completely retrieved.   There was mild scattered diverticulosis noted in the sigmoid colon.  The remainder of the colonic mucosa appeared normal throughout without evidence of inflammation/colitis. Retroflexed views  revealed no abnormalities. The time to cecum=2 minutes 42 seconds.  Withdrawal time=14 minutes 56 seconds.  The scope was withdrawn and the procedure completed.  COMPLICATIONS: There were no complications.    ENDOSCOPIC IMPRESSION: 1.   Normal mucosa in the terminal ileum 2.   Sessile polyp measuring 10 mm in size was found at the cecum; polypectomy was performed with a cold snare 3.   Sessile polyp measuring 3 mm in size was found in the distal sigmoid colon; polypectomy was performed with a cold snare 4.   There was mild diverticulosis noted in the sigmoid colon 5.    Otherwise normal colonic mucosa  RECOMMENDATIONS: 1.  Await pathology results 2.  Proceed to video capsule endoscopy 3.  Timing of repeat colonoscopy will be determined by pathology findings. 4.  You will receive a letter within 1-2 weeks with the results of your biopsy as well as final recommendations.  Please call my office if you have not received a letter after 3 weeks.   eSigned:  JJerene Bears MD 05/26/2014 8:39 AM   cc: The Patient   PATIENT NAME:  [name], WeaseMR#: [id number]

## 2014-05-26 NOTE — Progress Notes (Signed)
Called to room to assist during endoscopic procedure.  Patient ID and intended procedure confirmed with present staff. Received instructions for my participation in the procedure from the performing physician.  

## 2014-05-26 NOTE — Progress Notes (Signed)
Report to PACU, RN, vss, BBS= Clear.  

## 2014-05-26 NOTE — Progress Notes (Addendum)
Dr, pyrtle made aware of pt. C/o nausea verbal order received for 4 mg of zofran I.V. X1. 0848 Zofran diluted in 20cc saline and administered slowly, pt. Denies pain or burning during administering.[id number] pt. Stated that she felt better. Pt. Expelling air abdomen soft.

## 2014-05-27 ENCOUNTER — Telehealth: Payer: Self-pay | Admitting: *Deleted

## 2014-05-27 NOTE — Telephone Encounter (Signed)
Name identifier, Left message, follow-up

## 2014-05-28 ENCOUNTER — Telehealth: Payer: Self-pay | Admitting: Internal Medicine

## 2014-05-28 MED ORDER — TRAMADOL HCL 50 MG PO TABS: ORAL_TABLET | ORAL | Status: DC

## 2014-05-28 MED ORDER — ONDANSETRON HCL 4 MG PO TABS: 4.0000 mg | ORAL_TABLET | Freq: Four times a day (QID) | ORAL | Status: DC | PRN

## 2014-05-28 NOTE — Telephone Encounter (Signed)
Patient notified She will keep capsule endo procedure scheduled for next week

## 2014-05-28 NOTE — Telephone Encounter (Signed)
Zofran ODT 4 mg every 6 hours as needed for nausea Tramadol 50-100 mg every 6 hours as needed for pain

## 2014-05-28 NOTE — Telephone Encounter (Signed)
Patient reports she has "swelling on my side".  She reports that she had vomiting x 2 this am, but this has resolved.  I spoke with her while she was at work.  Can we call her in something for nausea and pain until capsule next week?

## 2014-06-02 ENCOUNTER — Ambulatory Visit (INDEPENDENT_AMBULATORY_CARE_PROVIDER_SITE_OTHER): Payer: BC Managed Care – PPO | Admitting: Internal Medicine

## 2014-06-02 ENCOUNTER — Encounter: Payer: Self-pay | Admitting: Internal Medicine

## 2014-06-02 DIAGNOSIS — K5000 Crohn's disease of small intestine without complications: Secondary | ICD-10-CM

## 2014-06-02 NOTE — Progress Notes (Signed)
Pt here for capsule endo. Pt completed prep and tolerated procedure well.  Lot #[id number] Exp:  2016-05

## 2014-06-05 ENCOUNTER — Telehealth: Payer: Self-pay | Admitting: Internal Medicine

## 2014-06-05 DIAGNOSIS — T189XXA Foreign body of alimentary tract, part unspecified, initial encounter: Secondary | ICD-10-CM

## 2014-06-08 NOTE — Telephone Encounter (Signed)
Call back to patient-no answer-left message to call on her voicemail

## 2014-06-09 NOTE — Telephone Encounter (Signed)
I have left message for the patient to call back

## 2014-06-09 NOTE — Telephone Encounter (Signed)
Patient doesn't believe she passed capsule endo.  She will come for a KUB one day this week.  She is notified we will call with the results of the capsule and KUB as soon as they are available

## 2014-06-12 ENCOUNTER — Telehealth: Payer: Self-pay | Admitting: *Deleted

## 2014-06-12 ENCOUNTER — Telehealth: Payer: Self-pay

## 2014-06-12 NOTE — Telephone Encounter (Signed)
Per [name], PA , patient has not gotten the KUB to check for capsule. Please, call and she if she passed it or when she is coming for KUB. Left a message for patient to call.

## 2014-06-12 NOTE — Telephone Encounter (Signed)
Spoke with patient and she is out of town working. She will come on Monday for KUB.

## 2014-06-15 NOTE — Telephone Encounter (Signed)
ok 

## 2014-06-15 NOTE — Telephone Encounter (Signed)
error 

## 2014-06-16 ENCOUNTER — Telehealth: Payer: Self-pay

## 2014-06-16 ENCOUNTER — Ambulatory Visit (INDEPENDENT_AMBULATORY_CARE_PROVIDER_SITE_OTHER)
Admission: RE | Admit: 2014-06-16 | Discharge: 2014-06-16 | Disposition: A | Discharge status: Home / Self Care | Payer: BC Managed Care – PPO | Source: Ambulatory Visit | Attending: Internal Medicine | Admitting: Internal Medicine

## 2014-06-16 ENCOUNTER — Other Ambulatory Visit: Payer: Self-pay

## 2014-06-16 DIAGNOSIS — T189XXA Foreign body of alimentary tract, part unspecified, initial encounter: Secondary | ICD-10-CM

## 2014-06-16 DIAGNOSIS — IMO0002 Reserved for concepts with insufficient information to code with codable children: Secondary | ICD-10-CM

## 2014-06-16 MED ORDER — BUDESONIDE 3 MG PO CP24: 9.0000 mg | ORAL_CAPSULE | Freq: Every day | ORAL | Status: DC

## 2014-06-16 NOTE — Telephone Encounter (Signed)
Pt came in for KUB today. Pt is already scheduled for OV with Dr. Hilarie Fredrickson. Script sent to pharmacy. Left message for pt to call back.  Spoke with pt and she is aware.

## 2014-06-16 NOTE — Telephone Encounter (Signed)
Message copied by Algernon Huxley on Tue Jun 16, 2014  3:55 PM ------      Message from: Jerene Bears      Created: Tue Jun 16, 2014  2:47 PM       VCE performed recently showed inflammation in her small bowel consistent with Crohn's disease      This is a new diagnosis, but what I expected      She still has not come for her KUB to document clearance of pill capsule.  She should do this ASAP      Please start ileal release budesonide (entocort) 9 mg daily for 8 weeks.  She then needs to follow-up with me within 6 weeks.      JMP       ------

## 2014-06-17 ENCOUNTER — Telehealth: Payer: Self-pay | Admitting: Internal Medicine

## 2014-06-17 MED ORDER — PREDNISONE 10 MG PO TABS: ORAL_TABLET | ORAL | Status: DC

## 2014-06-17 NOTE — Telephone Encounter (Signed)
Left message for pt to call back  °

## 2014-06-17 NOTE — Telephone Encounter (Signed)
1) Prednisone 10 mg tabs # 100 1 RF - take 4 tabs daily x 5 days then 3 tabs daily x 5 days then 2 tabs daily until she sees Dr. Hilarie Fredrickson 2) Calcium 1200 mg daily 3) Vit D 1000 IU daily

## 2014-06-17 NOTE — Telephone Encounter (Signed)
Pyrtle pt recently had capsule endo and per Dr. Hilarie Fredrickson pt has Crohns. Called in Entocort 9mg  daily yesterday and pt states it is to expensive and she cannot afford it. Dr. Carlean Purl as doc of the day please advise an alternative.

## 2014-06-17 NOTE — Telephone Encounter (Signed)
Spoke with pt and she is aware. Script sent to pharmacy. 

## 2014-06-24 ENCOUNTER — Encounter: Payer: Self-pay | Admitting: Internal Medicine

## 2014-06-29 ENCOUNTER — Ambulatory Visit (INDEPENDENT_AMBULATORY_CARE_PROVIDER_SITE_OTHER): Payer: BC Managed Care – PPO | Admitting: Internal Medicine

## 2014-06-29 ENCOUNTER — Other Ambulatory Visit: Payer: BC Managed Care – PPO

## 2014-06-29 ENCOUNTER — Encounter: Payer: Self-pay | Admitting: Internal Medicine

## 2014-06-29 VITALS — BP 120/64 | HR 72 | Ht 68.0 in | Wt 146.0 lb

## 2014-06-29 DIAGNOSIS — K5090 Crohn's disease, unspecified, without complications: Secondary | ICD-10-CM

## 2014-06-29 DIAGNOSIS — Z79899 Other long term (current) drug therapy: Secondary | ICD-10-CM

## 2014-06-29 MED ORDER — PREDNISONE 10 MG PO TABS: ORAL_TABLET | ORAL | Status: DC

## 2014-06-29 NOTE — Patient Instructions (Addendum)
Go to the basement for labs today [facility] will contact you about your Remicade appointment   Prednisone Taper:   20mg   X 14 days 15mg  X 14 days 10mg  X 14 days 5mg  X 14 days  Then Discontinue Prednisone   Follow up in 8 weeks  ( Sept 15th 11am)

## 2014-06-29 NOTE — Progress Notes (Signed)
Subjective:    Patient ID: Meghan Charles, female    DOB: [birth year], 43 y.o.   MRN: [id number]  HPI Serina Nichter is a 43 year old female with a past medical history of anxiety, adenomatous colon polyp, gallbladder polyp, and recurrent episodic abdominal pain and recent diagnosis of Crohn's enteritis is seen for followup. I saw her initially on 05/08/2014 to evaluate episodic midabdominal pain associated with nausea and vomiting. Symptoms were felt to be related to partial obstruction and Crohn's enteritis was hypothesized. She had a colonoscopy on 05/26/2014 which revealed a normal examined terminal ileum, and 2 adenomatous colon polyps the greatest was 10 mm in size, and left-sided diverticulosis. Subsequently a capsule endoscopy showed mid and distal small bowel ulcers and erosions consistent with Crohn's disease, capsule did not reach the cecum by the end of the study. KUB confirmed the capsule did pass. Ileal budesonide was prescribed but cost prohibitive. She was started on prednisone which she took 40 mg x5 days, 30 mg x5 days and has been on 20 mg daily for the last 5-7 days. She has been eating a very restricted diet, specifically a diet of [place] burger and macaroni and cheese. She has not had any severe painful episodes though occasionally has mild right lower quadrant pain but has not had nausea or vomiting. She denies fevers or chills, rashes, joint pains or complaints. Bowel limits for the most part are soft but formed without obvious blood or melena.  Again the family history of IBD. Maternal grandmother had colon cancer at age 73. Her prior abdominal surgery   Review of Systems As per history of present illness, otherwise negative  Current Medications, Allergies, Past Medical History, Past Surgical History, Family History and Social History were reviewed in [employer] record.     Objective:   Physical Exam BP 120/64  Pulse 72  Ht 5' 8"  (1.727 m)  Wt  146 lb (66.225 kg)  BMI 22.20 kg/m2 Constitutional: Well-developed and well-nourished. No distress. HEENT: Normocephalic and atraumatic. Oropharynx is clear and moist. No oropharyngeal exudate. Conjunctivae are normal.  No scleral icterus. Neck: Neck supple. Trachea midline. Cardiovascular: Normal rate, regular rhythm and intact distal pulses. No M/R/G Pulmonary/chest: Effort normal and breath sounds normal. No wheezing, rales or rhonchi. Abdominal: Soft, mild right mid and lower quadrant pain without rebound or guarding, nondistended. Bowel sounds active throughout. Extremities: no clubbing, cyanosis, or edema Lymphadenopathy: No cervical adenopathy noted. Neurological: Alert and oriented to person place and time. Skin: Skin is warm and dry. No rashes noted. Psychiatric: Normal mood and affect. Behavior is normal.  CT scan abdomen and pelvis with contrast dated 04/28/2014  --Findings abnormal mid to distal small bowel loops. Several loops appear dilated with thickened wall. Consider inflammatory bowel disease. Contrast is present throughout the bowel extending through to the rectum. Terminal ileum is normal appearing. Unremarkable appearance of the colon. Normal appendix. Small hiatal hernia. No evidence of free air or free fluid. Normal appearing liver, spleen, pancreas, adrenal glands, kidneys and gallbladder. No significant adenopathy. Bones intact. Lung bases clear. Bilateral breast implants.  CBC    Component Value Date/Time   WBC 6.0 05/08/2014 0940   RBC 4.61 05/08/2014 0940   RBC 3.91 11/05/2012 0837   HGB 13.7 05/08/2014 0940   HCT 40.6 05/08/2014 0940   PLT 346.0 05/08/2014 0940   MCV 88.0 05/08/2014 0940   MCH 30.0 03/06/2014 0417   MCHC 33.8 05/08/2014 0940   RDW 12.5 05/08/2014 0940   LYMPHSABS  1.0 03/06/2014 0417   MONOABS 0.2 03/06/2014 0417   EOSABS 0.0 03/06/2014 0417   BASOSABS 0.0 03/06/2014 0417   CMP     Component Value Date/Time   NA 140 05/08/2014 0940   K 4.5  05/08/2014 0940   CL 106 05/08/2014 0940   CO2 27 05/08/2014 0940   GLUCOSE 96 05/08/2014 0940   BUN 9 05/08/2014 0940   CREATININE 0.9 05/08/2014 0940   CALCIUM 9.2 05/08/2014 0940   PROT 8.0 05/08/2014 0940   ALBUMIN 4.1 05/08/2014 0940   AST 17 05/08/2014 0940   ALT 15 05/08/2014 0940   ALKPHOS 57 05/08/2014 0940   BILITOT 0.3 05/08/2014 0940   GFRNONAA >90 03/06/2014 0417   GFRAA >90 03/06/2014 0417   IBD extended panel - negative, not suggestive of IBD  hsCRP 1.24     Assessment & Plan:  43 year old female with a past medical history of anxiety, adenomatous colon polyp, gallbladder polyp, and recurrent episodic abdominal pain and recent diagnosis of Crohn's enteritis is seen for followup.  1. Crohn's enteritis with intermittent partial SBO -- Crohn's diagnosis made by video capsule endoscopy. We spent a long time today discussing Crohn's in general along with initial and maintenance therapies. She has not been on prednisone long enough to see a meaningful response, and unfortunately she could not afford budesonide. I will continue prednisone 20 mg daily x14 days and taper by 5 mg every 14 days until off. We discussed maintenance therapy including immunomodulators and biologics.  We also discussed the risks in great detail including the risk of infection (including reactivation of latent TB and underlying viral hepatitis), hepatotoxicity, leukopenia, pancreatitis, nausea, malignancy (specifically lymphoma), demyelinating disease, and even heart failure.  I do think biologic therapy is the best choice for her in an attempt to reach and maintain remission. After discussing Remicade and Humira, she prefers Remicade. We will initiate infliximab 5 mg per kilogram with a standard loading regimen followed by every 8 week infusion. She will get premedication with Tylenol and Benadryl. Labs today: Hepatitis serologies and quantiferon gold --Pneumovax recommend, annual flu vaccine recommend --Low-residue  diet --Followup in 8 weeks, sooner if necessary

## 2014-06-30 ENCOUNTER — Other Ambulatory Visit: Payer: Self-pay

## 2014-06-30 DIAGNOSIS — Z23 Encounter for immunization: Secondary | ICD-10-CM

## 2014-06-30 DIAGNOSIS — K50118 Crohn's disease of large intestine with other complication: Secondary | ICD-10-CM

## 2014-06-30 LAB — HEPATITIS B SURFACE ANTIBODY,QUALITATIVE: Hep B S Ab: NEGATIVE

## 2014-06-30 LAB — HEPATITIS C ANTIBODY: HCV AB: NEGATIVE

## 2014-06-30 LAB — HEPATITIS B SURFACE ANTIGEN: Hepatitis B Surface Ag: NEGATIVE

## 2014-06-30 LAB — HEPATITIS B CORE ANTIBODY, IGM: Hep B C IgM: NONREACTIVE

## 2014-06-30 NOTE — Progress Notes (Signed)
Patient is notified of the Remicade appointment date for 7/31 7:00, 8/14 8:00, and 9/11 7:00.  She will come tomorrow for a Pneumovax

## 2014-07-01 ENCOUNTER — Ambulatory Visit (INDEPENDENT_AMBULATORY_CARE_PROVIDER_SITE_OTHER): Payer: BC Managed Care – PPO | Admitting: Internal Medicine

## 2014-07-01 DIAGNOSIS — Z23 Encounter for immunization: Secondary | ICD-10-CM

## 2014-07-01 LAB — QUANTIFERON TB GOLD ASSAY (BLOOD)
INTERFERON GAMMA RELEASE ASSAY: NEGATIVE
QUANTIFERON NIL VALUE: 0.03 [IU]/mL
Quantiferon Tb Ag Minus Nil Value: 0.28 IU/mL
TB AG VALUE: 0.31 [IU]/mL

## 2014-07-03 ENCOUNTER — Other Ambulatory Visit: Payer: Self-pay

## 2014-07-06 ENCOUNTER — Ambulatory Visit (INDEPENDENT_AMBULATORY_CARE_PROVIDER_SITE_OTHER): Payer: BC Managed Care – PPO | Admitting: Internal Medicine

## 2014-07-06 DIAGNOSIS — Z23 Encounter for immunization: Secondary | ICD-10-CM

## 2014-07-13 ENCOUNTER — Telehealth: Payer: Self-pay | Admitting: Internal Medicine

## 2014-07-13 NOTE — Telephone Encounter (Signed)
I have spoken to patient and have given her the information below. She verbalizes understanding. We will have Dr Hilarie Fredrickson to sign the assistance form and will then have patient to pick up the form and fill out her portion and send to [employer]. Patient agrees that she would like to hold off on Remicade induction for now until she gets some assistance/coverage for the medication. I have spoken to [place] at [facility] Stay to cancel the 07/17/14 infusion for now.

## 2014-07-13 NOTE — Telephone Encounter (Signed)
Left message for patient to call back  

## 2014-07-13 NOTE — Telephone Encounter (Signed)
Patient states that the [employer] program denied assistance to her. I have spoken to a staff member at the [facility] program. Patient was denied because she has no insurance coverage as of 06/28/14. For [employer], patient's must have insurance to cover the medication portion of the infusion. I was given a form by Kai Levins to give to the patient though for patient assistance through them. I have filled out our portion and am awaiting a return call from the patient to go over this information with her. She may need to hold off on Remicade infusion scheduled for 07/17/14 until we get an approval for assistance.

## 2014-07-17 ENCOUNTER — Inpatient Hospital Stay (HOSPITAL_COMMUNITY): Admission: RE | Admit: 2014-07-17 | Payer: BC Managed Care – PPO | Source: Ambulatory Visit

## 2014-07-22 ENCOUNTER — Telehealth: Payer: Self-pay | Admitting: Gastroenterology

## 2014-07-22 MED ORDER — TRAMADOL HCL 50 MG PO TABS: ORAL_TABLET | ORAL | Status: DC

## 2014-07-22 MED ORDER — ONDANSETRON HCL 4 MG PO TABS: 4.0000 mg | ORAL_TABLET | Freq: Four times a day (QID) | ORAL | Status: DC | PRN

## 2014-07-22 NOTE — Telephone Encounter (Signed)
Rx sent 

## 2014-07-28 NOTE — Telephone Encounter (Signed)
I have spoken to patient and have given her the instructions below. She verbalizes understanding and will come to pick up forms tomorrow.

## 2014-07-28 NOTE — Telephone Encounter (Signed)
Dr Hilarie Fredrickson has signed assistance form. I have left a message for patient to call back. Patient was originally to be on prednisone 20 mg daily x 14 days and then down by 5 mg every 14 days thereafter until d/c. However, since there has been a delay in remicade initiation, he would have patient to remain at 5 mg until he sees her in the office on 09-01-14 or until she gets started on the remicade.

## 2014-07-30 ENCOUNTER — Telehealth: Payer: Self-pay | Admitting: *Deleted

## 2014-07-30 NOTE — Telephone Encounter (Signed)
Left message for patient to call back. She left a voicemail earlier today indicating that she was not feeling well but has improved somewhat since yesterday. I have asked that she call back first thing tomorrow morning.

## 2014-07-31 ENCOUNTER — Encounter (HOSPITAL_COMMUNITY): Payer: BC Managed Care – PPO

## 2014-07-31 MED ORDER — HYDROCODONE-ACETAMINOPHEN 5-325 MG PO TABS: ORAL_TABLET | ORAL | Status: DC

## 2014-07-31 NOTE — Telephone Encounter (Signed)
I have spoken to patient who states that starting Wednesday, she began having mid abdominal cramping as well diarrhea. She had 4 vomiting episodes yesterday but feels better today. She continues with some diarrhea. No blood in stool. She is currently on Prednisone 5 mg daily and has been taking Zofran for nausea. Patient was originally scheduled for Remicade induction on 07/17/14. However, due to being without insurance for the time being, we decided to hold off on that. She is currently trying to get assistance from [employer]. Patient also asks if there is anything other than tramadol that she can take as this is ineffective for her. Dr Hilarie Fredrickson, please advise.... Do I need to bump her prednisone back up for now (she could not afford budesonide either per your last office note)?

## 2014-07-31 NOTE — Telephone Encounter (Signed)
Please increase prednisone back to 10 mg daily And had prescription for hydrocodone-acetaminophen 5 at 325 mg every 4-6 hours as needed for severe pain, I would like her to limit this medication as much is possible, #30 no refills Have her continue to work diligently on getting Remicade assistance so we can start this ASAP

## 2014-07-31 NOTE — Telephone Encounter (Signed)
I have spoken to patient and have given Dr Vena Rua recommendations to increase prednisone back to 10 mg daily. I have also advised that we will give a limited supply of hydrocodone to take only for severe pain.  She will work on getting Remicade assistance.

## 2014-08-06 ENCOUNTER — Ambulatory Visit (INDEPENDENT_AMBULATORY_CARE_PROVIDER_SITE_OTHER): Payer: BC Managed Care – PPO | Admitting: Internal Medicine

## 2014-08-06 DIAGNOSIS — Z23 Encounter for immunization: Secondary | ICD-10-CM

## 2014-08-17 ENCOUNTER — Other Ambulatory Visit: Payer: Self-pay | Admitting: Internal Medicine

## 2014-08-17 DIAGNOSIS — K5090 Crohn's disease, unspecified, without complications: Secondary | ICD-10-CM

## 2014-08-17 NOTE — Telephone Encounter (Signed)
We have gotten correspondence back from [place] and [place] Patient Assistance that patient has been approved for Remicade patient assistance. I have spoken to Franchot Erichsen @ [place] inpatient pharmacy. She has contacted Wynetta Emery and [employer] Emer[employer] and is told that we should send in the delivery form and they will send medication to the appropriate location. We will have to do this each time patient is due for Remicade. I have left a message for patient to call back. Although the medication is covered, she does need to be advised that she will still be billed for the actual infusion (which is typically a couple hundred dollars).

## 2014-08-17 NOTE — Telephone Encounter (Signed)
Patient called back and has been made aware that Meghan Charles and Meghan Charles has approved her for assistance. I did advise that she will likely be charged for the actual infusion though. She verbalizes understanding and states that she has actually applied for assistance with [facility] billing to cover the infusion cost. She will be in contact with billing regarding whether she has been approved or not. At this time, she would like me to proceed with scheduling the infusions of Remicade. Orders have been placed in EPIC. I will schedule appointments tomorrow when Short Stay reopens.

## 2014-08-18 ENCOUNTER — Telehealth: Payer: Self-pay | Admitting: Internal Medicine

## 2014-08-18 MED ORDER — TRAMADOL HCL 50 MG PO TABS: ORAL_TABLET | ORAL | Status: DC

## 2014-08-18 NOTE — Telephone Encounter (Signed)
Okay to refill tramadol. Thank you for all of your work in setting up Remicade and helping the patient

## 2014-08-18 NOTE — Telephone Encounter (Signed)
Left message for patient. I have sent a prescription of tramadol for patient to pick up.

## 2014-08-18 NOTE — Addendum Note (Signed)
Addended by: Larina Bras on: 08/18/2014 10:39 AM   Modules accepted: Orders

## 2014-08-18 NOTE — Telephone Encounter (Signed)
I have spoken to [facility] @ Short Stay and have scheduled remicade induction as well as 8 week maintenance. Patient is scheduled on 09/08/14 @ 9:30 am, 09/22/14 @ 8 am, 10/20/14 @ 8 am and 12/16/14 @ 11 am. These are all at [facility]. I have left a message for Franchot Erichsen @ [place] to advise of dates of infusions and have asked that she return my call if there is anything further I need to do. I have faxed information to [employer] as well for delivery information. Patient has been advised of these dates and times as well as the location of her infusions and verbalizes understanding. She would like to know if Dr Hilarie Fredrickson would refill her tramadol. She states that she does have hydrocodone, however, she is only to take that for severe pain. Last tramadol script was sent on 07/22/14. Dr Hilarie Fredrickson, would you like me to refill rx?

## 2014-08-18 NOTE — Telephone Encounter (Signed)
Patient states that she has applied for assistance through [facility] but was denied. She talked to [place] at [phone] and says that she did not have a very positive experience as the attitude from billing was poor and they did not explain anything, just told her she could not apply for 6 more months and that "it is their policy." I called and spoke to [place] in billing who was very helpful. He states that when patient applied for assistance, she would have been asking for coverage for date of service 05/29/2014. At that time, she had insurance coverage. I explained that patient was actually trying to get assistance for FUTURE appointments, not previous. He states that individuals cannot apply for assistance prior to a scheduled date of service, they have to wait until AFTER the date of service. He says that since she was denied, she cannot reapply for 6 more months. I have explained this to the patient. [place] nor patient were aware that an individual cannot apply for assistance prior to having services completed. Patient also notes that nowhere on the paperwork she filled out was this indicated either. I have suggested that she contact a supervisor in billing after she has her remicade infusion and explain all of this to see if there is any way this can be overturned.

## 2014-08-28 ENCOUNTER — Encounter (HOSPITAL_COMMUNITY): Payer: BC Managed Care – PPO

## 2014-09-01 ENCOUNTER — Encounter: Payer: Self-pay | Admitting: Internal Medicine

## 2014-09-01 ENCOUNTER — Ambulatory Visit (INDEPENDENT_AMBULATORY_CARE_PROVIDER_SITE_OTHER): Payer: Self-pay | Admitting: Internal Medicine

## 2014-09-01 VITALS — BP 136/78 | HR 72 | Ht 68.0 in | Wt 148.0 lb

## 2014-09-01 DIAGNOSIS — Z860101 Personal history of adenomatous and serrated colon polyps: Secondary | ICD-10-CM

## 2014-09-01 DIAGNOSIS — K5090 Crohn's disease, unspecified, without complications: Secondary | ICD-10-CM

## 2014-09-01 DIAGNOSIS — K219 Gastro-esophageal reflux disease without esophagitis: Secondary | ICD-10-CM

## 2014-09-01 DIAGNOSIS — K50919 Crohn's disease, unspecified, with unspecified complications: Secondary | ICD-10-CM

## 2014-09-01 DIAGNOSIS — Z86010 Personal history of colonic polyps: Secondary | ICD-10-CM

## 2014-09-01 DIAGNOSIS — Z79899 Other long term (current) drug therapy: Secondary | ICD-10-CM

## 2014-09-01 NOTE — Patient Instructions (Signed)
Follow up in 7 weeks with Dr Hilarie Fredrickson.  We have you scheduled for 11/09/14  945 am call if this appointment will not work for you. Remicade as planned. Decrease prednisone the day you start Remicade to 5 mg.

## 2014-09-01 NOTE — Progress Notes (Signed)
Subjective:    Patient ID: Meghan Charles, female    DOB: 1971/05/03, 43 y.o.   MRN: [id number]  HPI Meghan Charles is a 43 year old female with Crohn's enteritis, history of adenomatous colon polyp in gallbladder polyp who is seen for followup. She was last seen in the office on 06/29/2014 and at that time prednisone was continued with plans to induce Remicade. Unfortunately she was unable to afford budesonide. She was having episodic abdominal pain and partial obstructive symptoms. She tapered prednisone down to 5 mg but then had further episodes of abdominal pain and the dose was increased back to 10 mg. Remicade has not been started yet due to issues getting the drug approved and a change in her insurance. She is clinically without insurance now but hopes to reestablish insurance with her new employer soon.  Overall she is feeling better though continues to take prednisone 10 mg daily. She is using omeprazole 40 mg for heartburn. Heartburn is well controlled now. She is eating better and has not had any abdominal pain, nausea or vomiting recently. She had one very mild episode several weeks ago while traveling to [place] on vacation. She feels this may have been secondary to overdoing it with her diet. She has gained weight which she attributes to prednisone and she would like to get off this medication as soon as possible. Remicade is scheduled to start next Tuesday, 09/08/2014   Review of Systems As per history of present illness, otherwise negative  Current Medications, Allergies, Past Medical History, Past Surgical History, Family History and Social History were reviewed in [employer] record.     Objective:   Physical Exam BP 136/78  Pulse 72  Ht 5\' 8"  (1.727 m)  Wt 148 lb (67.132 kg)  BMI 22.51 kg/m2 Constitutional: Well-developed and well-nourished. No distress. HEENT: Normocephalic and atraumatic. Oropharynx is clear and moist. No oropharyngeal  exudate. Conjunctivae are normal.  No scleral icterus. Neck: Neck supple. Trachea midline. Cardiovascular: Normal rate, regular rhythm and intact distal pulses.  Pulmonary/chest: Effort normal and breath sounds normal. No wheezing, rales or rhonchi. Abdominal: Soft, nontender, nondistended. Bowel sounds active throughout. There are no masses palpable. No hepatosplenomegaly. Extremities: no clubbing, cyanosis, or edema Neurological: Alert and oriented to person place and time. Skin: Skin is warm and dry. No rashes noted. Psychiatric: Normal mood and affect. Behavior is normal.  CBC    Component Value Date/Time   WBC 6.0 05/08/2014 0940   RBC 4.61 05/08/2014 0940   RBC 3.91 11/05/2012 0837   HGB 13.7 05/08/2014 0940   HCT 40.6 05/08/2014 0940   PLT 346.0 05/08/2014 0940   MCV 88.0 05/08/2014 0940   MCH 30.0 03/06/2014 0417   MCHC 33.8 05/08/2014 0940   RDW 12.5 05/08/2014 0940   LYMPHSABS 1.0 03/06/2014 0417   MONOABS 0.2 03/06/2014 0417   EOSABS 0.0 03/06/2014 0417   BASOSABS 0.0 03/06/2014 0417    CMP     Component Value Date/Time   NA 140 05/08/2014 0940   K 4.5 05/08/2014 0940   CL 106 05/08/2014 0940   CO2 27 05/08/2014 0940   GLUCOSE 96 05/08/2014 0940   BUN 9 05/08/2014 0940   CREATININE 0.9 05/08/2014 0940   CALCIUM 9.2 05/08/2014 0940   PROT 8.0 05/08/2014 0940   ALBUMIN 4.1 05/08/2014 0940   AST 17 05/08/2014 0940   ALT 15 05/08/2014 0940   ALKPHOS 57 05/08/2014 0940   BILITOT 0.3 05/08/2014 0940   GFRNONAA >90  03/06/2014 0417   GFRAA >90 03/06/2014 0417   CLINICAL DATA:  Epigastric abdominal pain for 3 years. Nausea and vomiting.   EXAM: MR ABDOMEN AND PELVIS WITHOUT AND WITH CONTRAST (MR ENTEROGRAPHY)   TECHNIQUE: Multiplanar, multisequence MRI of the abdomen and pelvis was performed both before and during bolus administration of intravenous contrast. Negative oral contrast VoLumen was given.   CONTRAST:  67mL MULTIHANCE GADOBENATE DIMEGLUMINE 529 MG/ML IV SOLN     COMPARISON:  04/15/2013 abdominal ultrasound. 11/04/2012 abdominal pelvic CT.   FINDINGS: MR ABDOMEN FINDINGS   Normal heart size without pericardial or pleural effusion. Bilateral breast implants.   Normal liver, spleen, stomach, pancreas, gallbladder, biliary tract, left adrenal gland. A right adrenal nodule measures 2.8 cm and is unchanged since the 10/2017/13 exam. Consistent with an adenoma on prior CT.   Normal kidneys.  No abdominal adenopathy or ascites.   MR PELVIS FINDINGS   Image portions of the uterus, urinary bladder normal. Normal ovaries, without significant free pelvic fluid.   Moderate bowel distension with neutral contrast. Terminal ileum is normal, including on image 86/ series 1001. An area of mid small bowel (likely distal jejunal) wall thickening and mucosal hyperenhancement. Example images 93-98 of series 1001. 14-16 of series 4. No surrounding abscess or adenopathy. No obstruction.   Equivocal soft tissue fullness within the ascending colon, just cephalad the ileocecal valve. Example image 20/series 4. Image 79 series 1001.   IMPRESSION: 1. Mid small bowel wall thickening and mucosal hyperenhancement, most consistent with enteritis. This could be infectious or related to an atypical appearance of inflammatory bowel disease/ Crohn's disease. No evidence of obstruction or other acute complication. This area could be slightly proximal to that described on the 11/04/2012 CT. 2. Normal appearance of the terminal ileum. 3. Possible soft tissue fullness within the ascending colon. This could represent retained stool. Cannot exclude colonic polyp or mass. Depending on clinical symptomatology, colonoscopy may be informative. 4. Right adrenal adenoma.     Electronically Signed   By: Abigail Miyamoto M.D.   On: 05/20/2014 09:02  VCE -- performed 06/02/2014, incomplete study. Abnormal small bowel with severe inflammation edema and ulceration which was  circumferential, stricturing areas. Short segments of normal-appearing bowel interspersed. Consistent with Crohn's disease    Assessment & Plan:   43 year old female with Crohn's enteritis, history of adenomatous colon polyp in gallbladder polyp who is seen for followup  1. Crohn's enteritis -- she is doing better from an abdominal standpoint on prednisone. We have discussed the importance of getting her off prednisone as quick as possible. Hopefully Remicade will allow [place] to taper prednisone completely. She is scheduled for Remicade induction, 5 mg/kg starting next Tuesday. We had previously discussed the risks and benefits of this drug and she is agreeable to proceed. I will have her decrease to prednisone 5 mg daily after her first infusion. I would like to see her back in 6 weeks for reassessment and this will be after finishing induction. She will continue low residue diet. Previous hepatitis serologies and TB testing negative.  2. GERD -- continue daily omeprazole 40 mg daily  3.  history of adenomatous colon polyp -- repeat colonoscopy in 3 years for adenomatous polyp surveillance

## 2014-09-03 ENCOUNTER — Encounter (HOSPITAL_COMMUNITY)
Admission: RE | Admit: 2014-09-03 | Discharge: 2014-09-03 | Disposition: A | Discharge status: Home / Self Care | Payer: BC Managed Care – PPO | Source: Ambulatory Visit | Attending: Internal Medicine | Admitting: Internal Medicine

## 2014-09-03 ENCOUNTER — Encounter (HOSPITAL_COMMUNITY): Payer: Self-pay

## 2014-09-03 ENCOUNTER — Encounter: Payer: Self-pay | Admitting: Internal Medicine

## 2014-09-03 VITALS — BP 148/98 | HR 92 | Temp 98.7°F | Resp 18 | Ht 68.0 in | Wt 147.8 lb

## 2014-09-03 DIAGNOSIS — K5090 Crohn's disease, unspecified, without complications: Secondary | ICD-10-CM | POA: Insufficient documentation

## 2014-09-03 MED ORDER — SODIUM CHLORIDE 0.9 % IV SOLN
INTRAVENOUS | Status: DC
  Administered 2014-09-03: 250 mL via INTRAVENOUS

## 2014-09-03 MED ORDER — DIPHENHYDRAMINE HCL 25 MG PO TABS
50.0000 mg | ORAL_TABLET | ORAL | Status: DC
  Administered 2014-09-03: 50 mg via ORAL
  Filled 2014-09-03: qty 2

## 2014-09-03 MED ORDER — ACETAMINOPHEN 325 MG PO TABS
650.0000 mg | ORAL_TABLET | ORAL | Status: DC
  Administered 2014-09-03: 650 mg via ORAL
  Filled 2014-09-03: qty 2

## 2014-09-03 MED ORDER — SODIUM CHLORIDE 0.9 % IV SOLN
5.0000 mg/kg | INTRAVENOUS | Status: DC
  Administered 2014-09-03: 300 mg via INTRAVENOUS
  Filled 2014-09-03: qty 30

## 2014-09-03 NOTE — Discharge Instructions (Signed)
REMICADE °Infliximab injection °What is this medicine? °INFLIXIMAB (in FLIX i mab) is used to treat Crohn's disease and ulcerative colitis. It is also used to treat ankylosing spondylitis, psoriasis, and some forms of arthritis. °This medicine may be used for other purposes; ask your health care provider or pharmacist if you have questions. °COMMON BRAND NAME(S): Remicade °What should I tell my health care provider before I take this medicine? °They need to know if you have any of these conditions: °-diabetes °-exposure to tuberculosis °-heart failure °-hepatitis or liver disease °-immune system problems °-infection °-lung or breathing disease, like COPD °-multiple sclerosis °-current or past resident of [place] °-seizure disorder °-an unusual or allergic reaction to infliximab, mouse proteins, other medicines, foods, dyes, or preservatives °-pregnant or trying to get pregnant °-breast-feeding °How should I use this medicine? °This medicine is for injection into a vein. It is usually given by a health care professional in a hospital or clinic setting. °A special MedGuide will be given to you by the pharmacist with each prescription and refill. Be sure to read this information carefully each time. °Talk to your pediatrician regarding the use of this medicine in children. Special care may be needed. °Overdosage: If you think you have taken too much of this medicine contact a poison control center or emergency room at once. °NOTE: This medicine is only for you. Do not share this medicine with others. °What if I miss a dose? °It is important not to miss your dose. Call your doctor or health care professional if you are unable to keep an appointment. °What may interact with this medicine? °Do not take this medicine with any of the following medications: °-anakinra °-rilonacept °This medicine may also interact with the following medications: °-vaccines °This list may not describe all possible  interactions. Give your health care provider a list of all the medicines, herbs, non-prescription drugs, or dietary supplements you use. Also tell them if you smoke, drink alcohol, or use illegal drugs. Some items may interact with your medicine. °What should I watch for while using this medicine? °Visit your doctor or health care professional for regular checks on your progress. °If you get a cold or other infection while receiving this medicine, call your doctor or health care professional. Do not treat yourself. This medicine may decrease your body's ability to fight infections. Before beginning therapy, your doctor may do a test to see if you have been exposed to tuberculosis. °This medicine may make the symptoms of heart failure worse in some patients. If you notice symptoms such as increased shortness of breath or swelling of the ankles or legs, contact your health care provider right away. °If you are going to have surgery or dental work, tell your health care professional or dentist that you have received this medicine. °If you take this medicine for plaque psoriasis, stay out of the sun. If you cannot avoid being in the sun, wear protective clothing and use sunscreen. Do not use sun lamps or tanning beds/booths. °What side effects may I notice from receiving this medicine? °Side effects that you should report to your doctor or health care professional as soon as possible: °-allergic reactions like skin rash, itching or hives, swelling of the face, lips, or tongue °-chest pain °-fever or chills, usually related to the infusion °-muscle or joint pain °-red, scaly patches or raised bumps on the skin °-signs of infection - fever or chills, cough, sore throat, pain or difficulty passing urine °-swollen lymph   nodes in the neck, underarm, or groin areas -unexplained weight loss -unusual bleeding or bruising -unusually weak or tired -yellowing of the eyes or skin Side effects that usually do not require  medical attention (report to your doctor or health care professional if they continue or are bothersome): -headache -heartburn or stomach pain -nausea, vomiting This list may not describe all possible side effects. Call your doctor for medical advice about side effects. You may report side effects to FDA at 1-800-FDA-1088. Where should I keep my medicine? This drug is given in a hospital or clinic and will not be stored at home. NOTE: This sheet is a summary. It may not cover all possible information. If you have questions about this medicine, talk to your doctor, pharmacist, or health care provider.  2015, Elsevier/Gold Standard. (2008-07-22 10:26:02) Crohn Disease Crohn disease is a long-term (chronic) soreness and redness (inflammation) of the intestines (bowel). It can affect any portion of the digestive tract, from the mouth to the anus. It can also cause problems outside the digestive tract. Crohn disease is closely related to a disease called ulcerative colitis (together, these two diseases are called inflammatory bowel disease).  CAUSES  The cause of Crohn disease is not known. One Link Snuffer is that, in an easily affected person, the immune system is triggered to attack the body's own digestive tissue. Crohn disease runs in families. It seems to be more common in certain geographic areas and amongst certain races. There are no clear-cut dietary causes.  SYMPTOMS  Crohn disease can cause many different symptoms since it can affect many different parts of the body. Symptoms include:  Fatigue.  Weight loss.  Chronic diarrhea, sometime bloody.  Abdominal pain and cramps.  Fever.  Ulcers or canker sores in the mouth or rectum.  Anemia (low red blood cells).  Arthritis, skin problems, and eye problems may occur. Complications of Crohn disease can include:  Series of holes (perforation) of the bowel.  Portions of the intestines sticking to each other (adhesions).  Obstruction of  the bowel.  Fistula formation, typically in the rectal area but also in other areas. A fistula is an opening between the bowels and the outside, or between the bowels and another organ.  A painful crack in the mucous membrane of the anus (rectal fissure). DIAGNOSIS  Your caregiver may suspect Crohn disease based on your symptoms and an exam. Blood tests may confirm that there is a problem. You may be asked to submit a stool specimen for examination. X-rays and CT scans may be necessary. Ultimately, the diagnosis is usually made after a procedure that uses a flexible tube that is inserted via your mouth or your anus. This is done under sedation and is called either an upper endoscopy or colonoscopy. With these tests, the specialist can take tiny tissue samples and remove them from the inside of the bowel (biopsy). Examination of this biopsy tissue under a microscope can reveal Crohn disease as the cause of your symptoms. Due to the many different forms that Crohn disease can take, symptoms may be present for several years before a diagnosis is made. TREATMENT  Medications are often used to decrease inflammation and control the immune system. These include medicines related to aspirin, steroid medications, and newer and stronger medications to slow down the immune system. Some medications may be used as suppositories or enemas. A number of other medications are used or have been studied. Your caregiver will make specific recommendations. HOME CARE INSTRUCTIONS   Symptoms  such as diarrhea can be controlled with medications. Avoid foods that have a laxative effect such as fresh fruit, vegetables, and dairy products. During flare-ups, you can rest your bowel by refraining from solid foods. Drink clear liquids frequently during the day. (Electrolyte or rehydrating fluids are best. Your caregiver can help you with suggestions.) Drink often to prevent loss of body fluids (dehydration). When diarrhea has cleared,  eat small meals and more frequently. Avoid food additives and stimulants such as caffeine (coffee, tea, or chocolate). Enzyme supplements may help if you develop intolerance to a sugar in dairy products (lactose). Ask your caregiver or dietitian about specific dietary instructions.  Try to maintain a positive attitude. Learn relaxation techniques such as self-hypnosis, mental imaging, and muscle relaxation.  If possible, avoid stresses which can aggravate your condition.  Exercise regularly.  Follow your diet.  Always get plenty of rest. SEEK MEDICAL CARE IF:   Your symptoms fail to improve after a week or two of new treatment.  You experience continued weight loss.  You have ongoing cramps or loose bowels.  You develop a new skin rash, skin sores, or eye problems. SEEK IMMEDIATE MEDICAL CARE IF:   You have worsening of your symptoms or develop new symptoms.  You have a fever.  You develop bloody diarrhea.  You develop severe abdominal pain. MAKE SURE YOU:   Understand these instructions.  Will watch your condition.  Will get help right away if you are not doing well or get worse. Document Released: 09/13/2005 Document Revised: 04/20/2014 Document Reviewed: 08/12/2007 [facility] Patient Information 2015 [place], [place]. This information is not intended to replace advice given to you by your health care provider. Make sure you discuss any questions you have with your health care provider.

## 2014-09-08 ENCOUNTER — Encounter (HOSPITAL_COMMUNITY): Admission: RE | Admit: 2014-09-08 | Payer: BC Managed Care – PPO | Source: Ambulatory Visit

## 2014-09-18 ENCOUNTER — Telehealth: Payer: Self-pay | Admitting: Internal Medicine

## 2014-09-18 DIAGNOSIS — K50919 Crohn's disease, unspecified, with unspecified complications: Secondary | ICD-10-CM

## 2014-09-21 NOTE — Telephone Encounter (Signed)
I have spoken to Herald @ [employer]. I advised that patient was scheduled to have insurance prior to her remicade infusion so she would no longer be eligible for [employer] patient assistance. Patient was to call us with insurance information so we could start prior authorization, however, she has yet to call. I contacted patient and she states that she does have insurance card with the following information: BCBS ID: [id number] Group: [id number] Customer Service #: [phone]. I will contact insurance to initiate prior authorization.

## 2014-09-22 ENCOUNTER — Encounter (HOSPITAL_COMMUNITY): Payer: Self-pay | Admitting: Emergency Medicine

## 2014-09-22 ENCOUNTER — Telehealth: Payer: Self-pay | Admitting: Internal Medicine

## 2014-09-22 ENCOUNTER — Emergency Department (HOSPITAL_COMMUNITY)
Admission: EM | Admit: 2014-09-22 | Discharge: 2014-09-22 | Disposition: A | Discharge status: Home / Self Care | Payer: BC Managed Care – PPO | Attending: Emergency Medicine | Admitting: Emergency Medicine

## 2014-09-22 ENCOUNTER — Encounter (HOSPITAL_COMMUNITY): Admission: RE | Admit: 2014-09-22 | Payer: BC Managed Care – PPO | Source: Ambulatory Visit

## 2014-09-22 ENCOUNTER — Emergency Department (HOSPITAL_COMMUNITY): Payer: BC Managed Care – PPO

## 2014-09-22 DIAGNOSIS — Z86010 Personal history of colonic polyps: Secondary | ICD-10-CM | POA: Insufficient documentation

## 2014-09-22 DIAGNOSIS — R1031 Right lower quadrant pain: Secondary | ICD-10-CM | POA: Diagnosis not present

## 2014-09-22 DIAGNOSIS — Z8719 Personal history of other diseases of the digestive system: Secondary | ICD-10-CM | POA: Diagnosis not present

## 2014-09-22 DIAGNOSIS — Z3202 Encounter for pregnancy test, result negative: Secondary | ICD-10-CM | POA: Diagnosis not present

## 2014-09-22 DIAGNOSIS — Z7952 Long term (current) use of systemic steroids: Secondary | ICD-10-CM | POA: Diagnosis not present

## 2014-09-22 DIAGNOSIS — R509 Fever, unspecified: Secondary | ICD-10-CM

## 2014-09-22 DIAGNOSIS — Z8659 Personal history of other mental and behavioral disorders: Secondary | ICD-10-CM | POA: Insufficient documentation

## 2014-09-22 DIAGNOSIS — Z79899 Other long term (current) drug therapy: Secondary | ICD-10-CM | POA: Diagnosis not present

## 2014-09-22 DIAGNOSIS — R112 Nausea with vomiting, unspecified: Secondary | ICD-10-CM | POA: Diagnosis not present

## 2014-09-22 DIAGNOSIS — J90 Pleural effusion, not elsewhere classified: Secondary | ICD-10-CM | POA: Insufficient documentation

## 2014-09-22 LAB — PREGNANCY, URINE: Preg Test, Ur: NEGATIVE

## 2014-09-22 LAB — CBC WITH DIFFERENTIAL/PLATELET
Basophils Absolute: 0.1 10*3/uL (ref 0.0–0.1)
Basophils Relative: 1 % (ref 0–1)
Eosinophils Absolute: 0.1 10*3/uL (ref 0.0–0.7)
Eosinophils Relative: 1 % (ref 0–5)
HEMATOCRIT: 37.8 % (ref 36.0–46.0)
Hemoglobin: 12.7 g/dL (ref 12.0–15.0)
LYMPHS PCT: 13 % (ref 12–46)
Lymphs Abs: 1.2 10*3/uL (ref 0.7–4.0)
MCH: 28.4 pg (ref 26.0–34.0)
MCHC: 33.6 g/dL (ref 30.0–36.0)
MCV: 84.6 fL (ref 78.0–100.0)
Monocytes Absolute: 1.2 10*3/uL — ABNORMAL HIGH (ref 0.1–1.0)
Monocytes Relative: 14 % — ABNORMAL HIGH (ref 3–12)
NEUTROS ABS: 6.5 10*3/uL (ref 1.7–7.7)
NEUTROS PCT: 71 % (ref 43–77)
Platelets: 328 10*3/uL (ref 150–400)
RBC: 4.47 MIL/uL (ref 3.87–5.11)
RDW: 13.3 % (ref 11.5–15.5)
WBC: 9 10*3/uL (ref 4.0–10.5)

## 2014-09-22 LAB — COMPREHENSIVE METABOLIC PANEL
ALT: 14 U/L (ref 0–35)
AST: 13 U/L (ref 0–37)
Albumin: 3.5 g/dL (ref 3.5–5.2)
Alkaline Phosphatase: 60 U/L (ref 39–117)
Anion gap: 15 (ref 5–15)
BILIRUBIN TOTAL: 0.4 mg/dL (ref 0.3–1.2)
BUN: 15 mg/dL (ref 6–23)
CO2: 22 meq/L (ref 19–32)
Calcium: 9.1 mg/dL (ref 8.4–10.5)
Chloride: 99 mEq/L (ref 96–112)
Creatinine, Ser: 0.62 mg/dL (ref 0.50–1.10)
GFR calc Af Amer: >90 mL/min (ref >90)
Glucose, Bld: 111 mg/dL — ABNORMAL HIGH (ref 70–99)
POTASSIUM: 4.7 meq/L (ref 3.7–5.3)
SODIUM: 136 meq/L — AB (ref 137–147)
Total Protein: 7.9 g/dL (ref 6.0–8.3)

## 2014-09-22 LAB — URINE MICROSCOPIC-ADD ON

## 2014-09-22 LAB — URINALYSIS, ROUTINE W REFLEX MICROSCOPIC
Bilirubin Urine: NEGATIVE
GLUCOSE, UA: NEGATIVE mg/dL
KETONES UR: NEGATIVE mg/dL
Nitrite: NEGATIVE
Protein, ur: 30 mg/dL — AB
Specific Gravity, Urine: 1.031 — ABNORMAL HIGH (ref 1.005–1.030)
Urobilinogen, UA: 1 mg/dL (ref 0.0–1.0)
pH: 6 (ref 5.0–8.0)

## 2014-09-22 LAB — LIPASE, BLOOD: LIPASE: 32 U/L (ref 11–59)

## 2014-09-22 MED ORDER — MORPHINE SULFATE 4 MG/ML IJ SOLN
4.0000 mg | Freq: Once | INTRAMUSCULAR | Status: AC
  Administered 2014-09-22: 4 mg via INTRAVENOUS
  Filled 2014-09-22: qty 1

## 2014-09-22 MED ORDER — AZITHROMYCIN 250 MG PO TABS: 250.0000 mg | ORAL_TABLET | Freq: Every day | ORAL | Status: DC

## 2014-09-22 MED ORDER — IOHEXOL 300 MG/ML  SOLN
50.0000 mL | Freq: Once | INTRAMUSCULAR | Status: AC | PRN
  Administered 2014-09-22: 50 mL via ORAL

## 2014-09-22 MED ORDER — IOHEXOL 300 MG/ML  SOLN
100.0000 mL | Freq: Once | INTRAMUSCULAR | Status: AC | PRN
  Administered 2014-09-22: 100 mL via INTRAVENOUS

## 2014-09-22 MED ORDER — ONDANSETRON HCL 4 MG/2ML IJ SOLN
4.0000 mg | Freq: Once | INTRAMUSCULAR | Status: AC
  Administered 2014-09-22: 4 mg via INTRAVENOUS
  Filled 2014-09-22: qty 2

## 2014-09-22 NOTE — ED Provider Notes (Signed)
CSN: [id number]     Arrival date & time 09/22/14  3149 History   First MD Initiated Contact with Patient 09/22/14 0915     Chief Complaint  Patient presents with  . Fever     (Consider location/radiation/quality/duration/timing/severity/associated sxs/prior Treatment) HPI  This is a 43 year old with history of Crohn's disease who presents with fever. Patient reports fevers since Saturday. She reports fever to 102 since Saturday. She states that yesterday she felt fine but this morning had recurrence of fever to 101. She reports vomiting there has been nonbilious and nonbloody. She denies any diarrhea or bloody stools. She did take ibuprofen prior to arrival.  She reports that she had a nonproductive cough on Saturday but that has not persisted. She is also reporting "mild right lower quadrant pain" thank you that she rates it 7/10. She's currently on prednisone and Remicade for Crohn's disease. She's followed by Dr. Hilarie Fredrickson. She denies any urinary symptoms.  Past Medical History  Diagnosis Date  . Gallbladder polyp   . Anxiety   . Colon polyps 05/2014    TUBULAR ADENOMA AND HYPERPLASTIC POLYP.  . Crohn's disease    Past Surgical History  Procedure Laterality Date  . Other surgical history      heel surgery  . Wisdom teeth extracted    . Right heel surgery      with plates and screws  . Colonoscopy w/ biopsies  2015   Family History  Problem Relation Age of Onset  . Colon cancer Maternal Grandmother 55  . Esophageal cancer Neg Hx   . Stomach cancer Neg Hx   . Rectal cancer Neg Hx   . Pancreatic cancer Neg Hx    History  Substance Use Topics  . Smoking status: Never Smoker   . Smokeless tobacco: Never Used  . Alcohol Use: 1.5 oz/week    3 drink(s) per week     Comment: 3 per week   OB History   Grav Para Term Preterm Abortions TAB SAB Ect Mult Living                 Review of Systems  Constitutional: Positive for fever.  Respiratory: Positive for cough. Negative for  chest tightness and shortness of breath.   Cardiovascular: Negative for chest pain.  Gastrointestinal: Positive for nausea, vomiting and abdominal pain. Negative for diarrhea and constipation.  Genitourinary: Negative for dysuria.  Musculoskeletal: Negative for back pain.  Neurological: Negative for headaches.  All other systems reviewed and are negative.     Allergies  Review of patient's allergies indicates no known allergies.  Home Medications   Prior to Admission medications   Medication Sig Start Date End Date Taking? Authorizing Provider  calcium carbonate 1250 MG capsule Take 1,250 mg by mouth daily.   Yes Historical Provider, MD  Cholecalciferol (VITAMIN D) 1000 UNITS capsule Take 1,000 Units by mouth daily.   Yes Historical Provider, MD  ondansetron (ZOFRAN) 4 MG tablet Take 1 tablet (4 mg total) by mouth every 6 (six) hours as needed for nausea or vomiting. 07/22/14  Yes Jerene Bears, MD  OVER THE COUNTER MEDICATION Place 2 drops into both eyes daily as needed (dry eyes). [employer] brand eye drops for red/itchy eyes.   Yes Historical Provider, MD  predniSONE (DELTASONE) 5 MG tablet Take 5 mg by mouth daily with breakfast.   Yes Historical Provider, MD  traMADol (ULTRAM) 50 MG tablet Take 1-2 tablets by mouth every 6 hours as needed for pain 08/18/14  Yes Lafayette Dragon, MD  azithromycin (ZITHROMAX) 250 MG tablet Take 1 tablet (250 mg total) by mouth daily. Take first 2 tablets together, then 1 every day until finished. 09/22/14   Merryl Hacker, MD   BP 130/86  Pulse 102  Temp(Src) 98.5 F (36.9 C) (Oral)  Resp 19  SpO2 98% Physical Exam  Nursing note and vitals reviewed. Constitutional: She is oriented to person, place, and time. She appears well-developed and well-nourished. No distress.  HENT:  Head: Normocephalic and atraumatic.  Mouth/Throat: Oropharynx is clear and moist. No oropharyngeal exudate.  Eyes: Pupils are equal, round, and reactive to light.   Cardiovascular: Regular rhythm and normal heart sounds.   Pulmonary/Chest: Effort normal and breath sounds normal. No respiratory distress. She has no wheezes. She exhibits no tenderness.  Abdominal: Soft. Bowel sounds are normal. There is tenderness. There is no rebound and no guarding.  Right lower quadrant tenderness to palpation without rebound or guarding  Musculoskeletal: She exhibits no edema.  Neurological: She is alert and oriented to person, place, and time.  Skin: Skin is warm and dry.  Psychiatric: She has a normal mood and affect.    ED Course  Procedures (including critical care time) Labs Review Labs Reviewed  CBC WITH DIFFERENTIAL - Abnormal; Notable for the following:    Monocytes Relative 14 (*)    Monocytes Absolute 1.2 (*)    All other components within normal limits  COMPREHENSIVE METABOLIC PANEL - Abnormal; Notable for the following:    Sodium 136 (*)    Glucose, Bld 111 (*)    All other components within normal limits  URINALYSIS, ROUTINE W REFLEX MICROSCOPIC - Abnormal; Notable for the following:    Color, Urine AMBER (*)    APPearance CLOUDY (*)    Specific Gravity, Urine 1.031 (*)    Hgb urine dipstick TRACE (*)    Protein, ur 30 (*)    Leukocytes, UA TRACE (*)    All other components within normal limits  URINE MICROSCOPIC-ADD ON - Abnormal; Notable for the following:    Squamous Epithelial / LPF FEW (*)    Bacteria, UA FEW (*)    All other components within normal limits  LIPASE, BLOOD  PREGNANCY, URINE    Imaging Review Ct Abdomen Pelvis W Contrast  09/22/2014   CLINICAL DATA:  Fever, chills, nausea.  Crohn's disease.  EXAM: CT ABDOMEN AND PELVIS WITH CONTRAST  TECHNIQUE: Multidetector CT imaging of the abdomen and pelvis was performed using the standard protocol following bolus administration of intravenous contrast.  CONTRAST:  127mL OMNIPAQUE IOHEXOL 300 MG/ML  SOLN  COMPARISON:  CT abdomen 11/04/2012  FINDINGS: Small to moderate left pleural  effusion with mild left lower lobe atelectasis.  Liver gallbladder and bile ducts are normal. Pancreas and spleen are normal. Right adrenal lesion measures 18 x 28 mm and is stable from prior studies consistent with adenoma. This has diffusely low attenuation compared to the liver.  Thickened ileum is present in the pelvis. This is approximately 10 cm proximal to the ileal cecal valve. The terminal ileum is not involved. There is a 15 cm segment of thickened ileum present in the pelvis which has progressed since 2013. This is most consistent with diagnosis of Crohn's disease. This is not causing bowel obstruction. Colon not involved. Appendix normal.  Negative for abscess.  No free fluid or adenopathy.  IMPRESSION: Right adrenal adenoma stable  Thickened loop of ileum in the pelvis consist with Crohn's disease not  causing obstruction. No abscess or free fluid. Progressive thickening of the ileum since 2013. The terminal ileum appears spared.   Electronically Signed   By: Franchot Gallo M.D.   On: 09/22/2014 13:12     EKG Interpretation None      MDM   Final diagnoses:  Fever, unspecified fever cause  RLQ abdominal pain  Pleural effusion    Patient presents with hoarseness for fever. Has developed vomiting and mild right lower quadrant pain. States that she had a cough that is nonproductive on Saturday but has had no persistence of cough. Basic labwork obtained and reassuring. No significant leukocytosis. She is tender on exam without evidence of peritonitis. Given history of Crohn's disease, would be concerned for early infection. CT of the abdomen was obtained and shows thickening of the ileum without obvious abscess. Also shows a small left pleural effusion. 3 exams reassuring. Discussed with GI on call. Patient will call Dr. Vena Rua office for any medications adjustment. Will defer abdominal antibiotics at this time given that patient has no evidence of leukocytosis or infection.  Discussed with  patient empirically placed on azithromycin given pleural effusion and cough on Saturday. Patient was given strict return precautions.  After history, exam, and medical workup I feel the patient has been appropriately medically screened and is safe for discharge home. Pertinent diagnoses were discussed with the patient. Patient was given return precautions.    Merryl Hacker, MD 09/22/14 [phone]

## 2014-09-22 NOTE — ED Notes (Signed)
Per pt, woke up Saturday with chills.  Noted fever.  Felt ok.  Sun/Mon fever continued.  Goes up and comes down.  This morning fever continues.  Nausea/ "maybe vomiting".  No urine symptoms, no cough

## 2014-09-22 NOTE — Discharge Instructions (Signed)
You were evaluated today for fever. Noted to have some abdominal tenderness on exam. Given her Crohn's disease, a CT scan was obtained and shows increasing thickening of the colon in the right lower quadrant. You need to follow up with her GI Dr. At this time antibiotics will be be deferred given that there is no obvious abscess any clinically appear well. He may need adjustment of your immunosuppression. You should return if he had any new or worsening symptoms including worsening pain, persistent fevers, bloody stools or inability to stay hydrated.  You were also found to have a pleural effusion.  GIven fevers at home, will treat with antibiotics.

## 2014-09-22 NOTE — Telephone Encounter (Signed)
Pt states she was seen in the ER today and was told to call our office. States they did another CT scan today and it shows increased thickening. Pt wants to know if Dr. Hilarie Fredrickson wanted to adjust her medication. Please advise. Carla Drape and [facility] are working on prior [place] for Remicade).

## 2014-09-22 NOTE — Telephone Encounter (Signed)
A prior authorization request has been faxed to [facility] on behalf of patient for her remicade. We are awaiting a response from insurance.

## 2014-09-22 NOTE — ED Notes (Signed)
MD at bedside. EDP [facility] PRESENT TO EVALUATE PT

## 2014-09-22 NOTE — ED Notes (Signed)
MD at bedside.  EDP [facility] IN TO EVALUATE THIS PT

## 2014-09-23 ENCOUNTER — Telehealth: Payer: Self-pay | Admitting: Internal Medicine

## 2014-09-23 NOTE — Telephone Encounter (Signed)
Patient thinks that she may still able to get assistance from [place] even though she has insurance. She states that they will be doing an insurance verification and will be getting back to her about their decision.

## 2014-09-23 NOTE — Telephone Encounter (Signed)
CT reviewed, she has had 1 Remicade infusion and now has an insurance change but needs to complete induction Would not expect meaningful response this early in therapy, my recommendation is that we continue to work hard to get Remicade approved and on schedule If Remicade fails surgery would likely become the best option, but we don't know yet because we are far enough into Remicade

## 2014-09-23 NOTE — Telephone Encounter (Signed)
Left message for pt to call back.  Spoke with pt and she is aware.

## 2014-09-24 ENCOUNTER — Inpatient Hospital Stay (HOSPITAL_COMMUNITY)
Admission: EM | Admit: 2014-09-24 | Discharge: 2014-09-29 | DRG: 193 | Disposition: A | Discharge status: Home / Self Care | Payer: BC Managed Care – PPO | Attending: Family Medicine | Admitting: Family Medicine

## 2014-09-24 ENCOUNTER — Encounter (HOSPITAL_COMMUNITY): Payer: Self-pay | Admitting: Emergency Medicine

## 2014-09-24 ENCOUNTER — Emergency Department (HOSPITAL_COMMUNITY): Payer: BC Managed Care – PPO

## 2014-09-24 ENCOUNTER — Inpatient Hospital Stay (HOSPITAL_COMMUNITY): Payer: BC Managed Care – PPO

## 2014-09-24 DIAGNOSIS — I10 Essential (primary) hypertension: Secondary | ICD-10-CM | POA: Diagnosis present

## 2014-09-24 DIAGNOSIS — J189 Pneumonia, unspecified organism: Principal | ICD-10-CM | POA: Diagnosis present

## 2014-09-24 DIAGNOSIS — Z86010 Personal history of colonic polyps: Secondary | ICD-10-CM

## 2014-09-24 DIAGNOSIS — R109 Unspecified abdominal pain: Secondary | ICD-10-CM | POA: Diagnosis present

## 2014-09-24 DIAGNOSIS — E876 Hypokalemia: Secondary | ICD-10-CM | POA: Diagnosis present

## 2014-09-24 DIAGNOSIS — J9601 Acute respiratory failure with hypoxia: Secondary | ICD-10-CM | POA: Diagnosis present

## 2014-09-24 DIAGNOSIS — G8929 Other chronic pain: Secondary | ICD-10-CM | POA: Diagnosis present

## 2014-09-24 DIAGNOSIS — Z7952 Long term (current) use of systemic steroids: Secondary | ICD-10-CM

## 2014-09-24 DIAGNOSIS — K5010 Crohn's disease of large intestine without complications: Secondary | ICD-10-CM | POA: Diagnosis present

## 2014-09-24 DIAGNOSIS — R000 Tachycardia, unspecified: Secondary | ICD-10-CM | POA: Diagnosis present

## 2014-09-24 DIAGNOSIS — E860 Dehydration: Secondary | ICD-10-CM | POA: Diagnosis present

## 2014-09-24 DIAGNOSIS — J918 Pleural effusion in other conditions classified elsewhere: Secondary | ICD-10-CM | POA: Diagnosis present

## 2014-09-24 DIAGNOSIS — Z6822 Body mass index (BMI) 22.0-22.9, adult: Secondary | ICD-10-CM | POA: Diagnosis not present

## 2014-09-24 DIAGNOSIS — R112 Nausea with vomiting, unspecified: Secondary | ICD-10-CM

## 2014-09-24 DIAGNOSIS — K5090 Crohn's disease, unspecified, without complications: Secondary | ICD-10-CM | POA: Diagnosis present

## 2014-09-24 DIAGNOSIS — Z9889 Other specified postprocedural states: Secondary | ICD-10-CM

## 2014-09-24 DIAGNOSIS — F419 Anxiety disorder, unspecified: Secondary | ICD-10-CM | POA: Diagnosis present

## 2014-09-24 DIAGNOSIS — J90 Pleural effusion, not elsewhere classified: Secondary | ICD-10-CM

## 2014-09-24 DIAGNOSIS — K219 Gastro-esophageal reflux disease without esophagitis: Secondary | ICD-10-CM | POA: Diagnosis present

## 2014-09-24 DIAGNOSIS — R509 Fever, unspecified: Secondary | ICD-10-CM | POA: Diagnosis not present

## 2014-09-24 DIAGNOSIS — K50918 Crohn's disease, unspecified, with other complication: Secondary | ICD-10-CM

## 2014-09-24 DIAGNOSIS — Z800 Family history of malignant neoplasm of digestive organs: Secondary | ICD-10-CM

## 2014-09-24 DIAGNOSIS — I471 Supraventricular tachycardia: Secondary | ICD-10-CM

## 2014-09-24 DIAGNOSIS — K529 Noninfective gastroenteritis and colitis, unspecified: Secondary | ICD-10-CM

## 2014-09-24 DIAGNOSIS — D72829 Elevated white blood cell count, unspecified: Secondary | ICD-10-CM | POA: Diagnosis present

## 2014-09-24 LAB — URINALYSIS, ROUTINE W REFLEX MICROSCOPIC
Glucose, UA: NEGATIVE mg/dL
Hgb urine dipstick: NEGATIVE
Ketones, ur: 40 mg/dL — AB
LEUKOCYTES UA: NEGATIVE
Nitrite: NEGATIVE
PH: 6 (ref 5.0–8.0)
Protein, ur: 30 mg/dL — AB
Specific Gravity, Urine: >1.046 — ABNORMAL HIGH (ref 1.005–1.030)
Urobilinogen, UA: 1 mg/dL (ref 0.0–1.0)

## 2014-09-24 LAB — PHOSPHORUS: PHOSPHORUS: 3.8 mg/dL (ref 2.3–4.6)

## 2014-09-24 LAB — CBC WITH DIFFERENTIAL/PLATELET
BASOS ABS: 0 10*3/uL (ref 0.0–0.1)
BASOS PCT: 0 % (ref 0–1)
EOS ABS: 0.1 10*3/uL (ref 0.0–0.7)
EOS PCT: 1 % (ref 0–5)
HCT: 39.8 % (ref 36.0–46.0)
Hemoglobin: 13.6 g/dL (ref 12.0–15.0)
LYMPHS PCT: 8 % — AB (ref 12–46)
Lymphs Abs: 1 10*3/uL (ref 0.7–4.0)
MCH: 28.8 pg (ref 26.0–34.0)
MCHC: 34.2 g/dL (ref 30.0–36.0)
MCV: 84.3 fL (ref 78.0–100.0)
Monocytes Absolute: 1.3 10*3/uL — ABNORMAL HIGH (ref 0.1–1.0)
Monocytes Relative: 10 % (ref 3–12)
Neutro Abs: 10.3 10*3/uL — ABNORMAL HIGH (ref 1.7–7.7)
Neutrophils Relative %: 81 % — ABNORMAL HIGH (ref 43–77)
PLATELETS: 360 10*3/uL (ref 150–400)
RBC: 4.72 MIL/uL (ref 3.87–5.11)
RDW: 13.1 % (ref 11.5–15.5)
WBC: 12.7 10*3/uL — AB (ref 4.0–10.5)

## 2014-09-24 LAB — COMPREHENSIVE METABOLIC PANEL
ALT: 25 U/L (ref 0–35)
AST: 23 U/L (ref 0–37)
Albumin: 3.5 g/dL (ref 3.5–5.2)
Alkaline Phosphatase: 65 U/L (ref 39–117)
Anion gap: 15 (ref 5–15)
BUN: 18 mg/dL (ref 6–23)
CALCIUM: 9.3 mg/dL (ref 8.4–10.5)
CO2: 24 meq/L (ref 19–32)
CREATININE: 0.7 mg/dL (ref 0.50–1.10)
Chloride: 98 mEq/L (ref 96–112)
GFR calc Af Amer: >90 mL/min (ref >90)
GFR calc non Af Amer: >90 mL/min (ref >90)
Glucose, Bld: 111 mg/dL — ABNORMAL HIGH (ref 70–99)
Potassium: 3.9 mEq/L (ref 3.7–5.3)
SODIUM: 137 meq/L (ref 137–147)
TOTAL PROTEIN: 8.3 g/dL (ref 6.0–8.3)
Total Bilirubin: 0.4 mg/dL (ref 0.3–1.2)

## 2014-09-24 LAB — URINE MICROSCOPIC-ADD ON

## 2014-09-24 LAB — MAGNESIUM: Magnesium: 2 mg/dL (ref 1.5–2.5)

## 2014-09-24 LAB — LACTATE DEHYDROGENASE: LDH: 223 U/L (ref 94–250)

## 2014-09-24 LAB — PREGNANCY, URINE: PREG TEST UR: NEGATIVE

## 2014-09-24 MED ORDER — LEVALBUTEROL HCL 0.63 MG/3ML IN NEBU
0.6300 mg | INHALATION_SOLUTION | Freq: Three times a day (TID) | RESPIRATORY_TRACT | Status: DC
  Administered 2014-09-25 – 2014-09-27 (×7): 0.63 mg via RESPIRATORY_TRACT
  Filled 2014-09-24 (×16): qty 3

## 2014-09-24 MED ORDER — LEVALBUTEROL HCL 0.63 MG/3ML IN NEBU: 0.6300 mg | INHALATION_SOLUTION | RESPIRATORY_TRACT | Status: DC | PRN

## 2014-09-24 MED ORDER — LEVOFLOXACIN IN D5W 750 MG/150ML IV SOLN: 750.0000 mg | INTRAVENOUS | Status: DC

## 2014-09-24 MED ORDER — GUAIFENESIN ER 600 MG PO TB12
1200.0000 mg | ORAL_TABLET | Freq: Two times a day (BID) | ORAL | Status: DC
  Administered 2014-09-24 – 2014-09-29 (×10): 1200 mg via ORAL
  Filled 2014-09-24 (×12): qty 2

## 2014-09-24 MED ORDER — IOHEXOL 350 MG/ML SOLN
100.0000 mL | Freq: Once | INTRAVENOUS | Status: AC | PRN
  Administered 2014-09-24: 100 mL via INTRAVENOUS

## 2014-09-24 MED ORDER — ACETAMINOPHEN 325 MG PO TABS
650.0000 mg | ORAL_TABLET | ORAL | Status: DC | PRN
Start: 2014-09-24 — End: 2014-09-29
  Administered 2014-09-24 – 2014-09-28 (×6): 650 mg via ORAL
  Filled 2014-09-24 (×6): qty 2

## 2014-09-24 MED ORDER — HYDROMORPHONE HCL 1 MG/ML IJ SOLN: 1.0000 mg | INTRAMUSCULAR | Status: DC | PRN

## 2014-09-24 MED ORDER — ONDANSETRON HCL 4 MG/2ML IJ SOLN
4.0000 mg | Freq: Four times a day (QID) | INTRAMUSCULAR | Status: DC | PRN
  Administered 2014-09-25 – 2014-09-29 (×8): 4 mg via INTRAVENOUS
  Filled 2014-09-24 (×8): qty 2

## 2014-09-24 MED ORDER — PANTOPRAZOLE SODIUM 40 MG PO TBEC
40.0000 mg | DELAYED_RELEASE_TABLET | Freq: Every day | ORAL | Status: DC
  Administered 2014-09-25 – 2014-09-29 (×5): 40 mg via ORAL
  Filled 2014-09-24 (×6): qty 1

## 2014-09-24 MED ORDER — ONDANSETRON HCL 4 MG/2ML IJ SOLN: 4.0000 mg | Freq: Three times a day (TID) | INTRAMUSCULAR | Status: AC | PRN

## 2014-09-24 MED ORDER — LEVALBUTEROL HCL 0.63 MG/3ML IN NEBU
0.6300 mg | INHALATION_SOLUTION | Freq: Four times a day (QID) | RESPIRATORY_TRACT | Status: DC
  Administered 2014-09-24: 0.63 mg via RESPIRATORY_TRACT
  Filled 2014-09-24: qty 3

## 2014-09-24 MED ORDER — CALCIUM CARBONATE 1250 (500 CA) MG PO TABS
1250.0000 mg | ORAL_TABLET | Freq: Every day | ORAL | Status: DC
  Administered 2014-09-25 – 2014-09-29 (×5): 1250 mg via ORAL
  Filled 2014-09-24 (×5): qty 1

## 2014-09-24 MED ORDER — SODIUM CHLORIDE 0.9 % IV BOLUS (SEPSIS)
1000.0000 mL | Freq: Once | INTRAVENOUS | Status: AC
  Administered 2014-09-24: 1000 mL via INTRAVENOUS

## 2014-09-24 MED ORDER — TRAMADOL HCL 50 MG PO TABS
50.0000 mg | ORAL_TABLET | Freq: Four times a day (QID) | ORAL | Status: DC | PRN
  Administered 2014-09-26: 50 mg via ORAL
  Filled 2014-09-24: qty 1

## 2014-09-24 MED ORDER — ONDANSETRON HCL 4 MG/2ML IJ SOLN
4.0000 mg | Freq: Once | INTRAMUSCULAR | Status: AC
  Administered 2014-09-24: 4 mg via INTRAVENOUS
  Filled 2014-09-24: qty 2

## 2014-09-24 MED ORDER — VANCOMYCIN HCL IN DEXTROSE 750-5 MG/150ML-% IV SOLN
750.0000 mg | Freq: Three times a day (TID) | INTRAVENOUS | Status: DC
  Administered 2014-09-25 – 2014-09-26 (×5): 750 mg via INTRAVENOUS
  Filled 2014-09-24 (×7): qty 150

## 2014-09-24 MED ORDER — MORPHINE SULFATE 2 MG/ML IJ SOLN
2.0000 mg | INTRAMUSCULAR | Status: DC | PRN
  Administered 2014-09-25: 4 mg via INTRAVENOUS
  Filled 2014-09-24: qty 2
  Filled 2014-09-24: qty 1

## 2014-09-24 MED ORDER — VITAMIN D 1000 UNITS PO TABS
1000.0000 [IU] | ORAL_TABLET | Freq: Every day | ORAL | Status: DC
  Administered 2014-09-25 – 2014-09-29 (×5): 1000 [IU] via ORAL
  Filled 2014-09-24 (×5): qty 1

## 2014-09-24 MED ORDER — SODIUM CHLORIDE 0.9 % IV SOLN
INTRAVENOUS | Status: DC
Start: 2014-09-24 — End: 2014-09-29
  Administered 2014-09-24 – 2014-09-29 (×11): via INTRAVENOUS

## 2014-09-24 MED ORDER — LEVOFLOXACIN IN D5W 750 MG/150ML IV SOLN
750.0000 mg | INTRAVENOUS | Status: AC
  Administered 2014-09-24 – 2014-09-28 (×5): 750 mg via INTRAVENOUS
  Filled 2014-09-24 (×5): qty 150

## 2014-09-24 MED ORDER — ENOXAPARIN SODIUM 40 MG/0.4ML ~~LOC~~ SOLN
40.0000 mg | SUBCUTANEOUS | Status: DC
  Administered 2014-09-24 – 2014-09-28 (×5): 40 mg via SUBCUTANEOUS
  Filled 2014-09-24 (×6): qty 0.4

## 2014-09-24 MED ORDER — METRONIDAZOLE IN NACL 5-0.79 MG/ML-% IV SOLN
500.0000 mg | Freq: Three times a day (TID) | INTRAVENOUS | Status: DC
  Administered 2014-09-24 – 2014-09-29 (×14): 500 mg via INTRAVENOUS
  Filled 2014-09-24 (×15): qty 100

## 2014-09-24 MED ORDER — ZOLPIDEM TARTRATE 5 MG PO TABS
5.0000 mg | ORAL_TABLET | Freq: Once | ORAL | Status: AC
  Administered 2014-09-24: 5 mg via ORAL
  Filled 2014-09-24: qty 1

## 2014-09-24 MED ORDER — PREDNISONE 10 MG PO TABS: 10.0000 mg | ORAL_TABLET | Freq: Every day | ORAL | Status: DC

## 2014-09-24 MED ORDER — MORPHINE SULFATE 15 MG PO TABS
15.0000 mg | ORAL_TABLET | ORAL | Status: DC | PRN
  Administered 2014-09-25 – 2014-09-28 (×7): 15 mg via ORAL
  Filled 2014-09-24 (×7): qty 1

## 2014-09-24 MED ORDER — VANCOMYCIN HCL IN DEXTROSE 1-5 GM/200ML-% IV SOLN
1000.0000 mg | INTRAVENOUS | Status: AC
  Administered 2014-09-24: 1000 mg via INTRAVENOUS
  Filled 2014-09-24: qty 200

## 2014-09-24 MED ORDER — ALBUTEROL SULFATE (2.5 MG/3ML) 0.083% IN NEBU: 2.5000 mg | INHALATION_SOLUTION | RESPIRATORY_TRACT | Status: DC | PRN

## 2014-09-24 MED ORDER — MORPHINE SULFATE 4 MG/ML IJ SOLN
4.0000 mg | Freq: Once | INTRAMUSCULAR | Status: AC
  Administered 2014-09-24: 4 mg via INTRAVENOUS
  Filled 2014-09-24: qty 1

## 2014-09-24 NOTE — H&P (Addendum)
[facility] History and Physical  Meghan Charles QIO:[id number] DOB: Feb 02, 1971 DOA: 09/24/2014  Referring physician: Dr. Mingo Amber PCP: No primary provider on file.   Chief Complaint: Fever, nausea, emesis, diarrhea  HPI: Meghan Charles is a 43 y.o. female  With history of Crohn's disease diagnosed in June of 2015 who was started on Remicade and has received one dose on 09/03/2014 on chronic prednisone therapy, anxiety, who presents to the ED with five-day history of intermittent fevers, nausea and emesis. Patient states that 5 days prior to admission had a temperature of 102 and felt better subsequently after that for the next 2 days. 2 days prior to admission patient stated awoke around 3:15 in the morning noted to have a temperature of 101.7 with some associated nausea nonbilious emesis x3. Patient subsequently presented to the ED which he states was given some pain medications IV fluids and anti-medics. CT scan was done that showed thickening around the colon and are noted left pleural effusion. Patient said at that time was done to develop a nonproductive cough and was subsequently discharged on a Z-Pak. Patient states 1 day prior to admission had a temperature on a 1.5 at work with some nausea and emesis which was ongoing for 2 hours. The day of admission while at work patient noted she has some nausea and emesis and subsequently presented to the ED. Patient endorses some chills, some associated shortness of breath, nonproductive cough, diarrhea/loose stools with 3-4 episodes one day prior to admission and right lower quadrant abdominal pain she states is unchanged from her chronic abdominal pain secondary to her Crohn's. Patient also endorses some generalized weakness. Patient denies any chest pain, no constipation, no dysuria, no melena, no hematemesis, no hematochezia. Patient was seen in the emergency room and noted to have a temp of 100.1 with heart rate of 132 and blood pressure of  129/77. Labs obtained had a comprehensive metabolic profile which was unremarkable. CBC obtained had a white count of 12.7 with a left shift. Urine pregnancy test was negative. Urinalysis which was done was cloudy with a specific gravity of greater than 1.046 some ketones some protein nitrite negative leukocytes -0-2 WBCs. CT angiogram of the chest done showed bilateral multifocal multilobar airspace disease most consistent with pneumonia. Reactive sized prevascular lymph node. Moderate sized simple left pleural effusion. Negative for PE. We were called to admit the patient for further evaluation and management.   Review of Systems: As per history of present illness otherwise negative. Constitutional:  No weight loss, night sweats, fatigue.  HEENT:  No headaches, Difficulty swallowing,Tooth/dental problems,Sore throat,  No sneezing, itching, ear ache, nasal congestion, post nasal drip,  Cardio-vascular:  No chest pain, Orthopnea, PND, swelling in lower extremities, anasarca, dizziness. GI:  No heartburn, indigestion,  change in bowel habits, loss of appetite  Resp:  No excess mucus, no productive cough, No coughing up of blood.No change in color of mucus.No wheezing.No chest wall deformity  Skin:  no rash or lesions.  GU:  no dysuria, change in color of urine, no urgency or frequency. No flank pain.  Musculoskeletal:  No joint pain or swelling. No decreased range of motion. No back pain.  Psych:  No change in mood or affect. No depression or anxiety. No memory loss.   Past Medical History  Diagnosis Date  . Gallbladder polyp   . Anxiety   . Colon polyps 05/2014    TUBULAR ADENOMA AND HYPERPLASTIC POLYP.  . Crohn's disease    Past Surgical History  Procedure Laterality Date  . Other surgical history      heel surgery  . Wisdom teeth extracted    . Right heel surgery      with plates and screws  . Colonoscopy w/ biopsies  2015   Social History:  reports that she has never  smoked. She has never used smokeless tobacco. She reports that she drinks alcohol. She reports that she does not use illicit drugs.  No Known Allergies  Family History  Problem Relation Age of Onset  . Colon cancer Maternal Grandmother 33  . Esophageal cancer Neg Hx   . Stomach cancer Neg Hx   . Rectal cancer Neg Hx   . Pancreatic cancer Neg Hx      Prior to Admission medications   Medication Sig Start Date End Date Taking? Authorizing Provider  azithromycin (ZITHROMAX) 250 MG tablet Take 1 tablet (250 mg total) by mouth daily. Take first 2 tablets together, then 1 every day until finished. 09/22/14  Yes Merryl Hacker, MD  calcium carbonate 1250 MG capsule Take 1,250 mg by mouth daily.   Yes Historical Provider, MD  Cholecalciferol (VITAMIN D) 1000 UNITS capsule Take 1,000 Units by mouth daily.   Yes Historical Provider, MD  ondansetron (ZOFRAN) 4 MG tablet Take 1 tablet (4 mg total) by mouth every 6 (six) hours as needed for nausea or vomiting. 07/22/14  Yes Jerene Bears, MD  OVER THE COUNTER MEDICATION Place 2 drops into both eyes daily as needed (dry eyes). [employer] brand eye drops for red/itchy eyes.   Yes Historical Provider, MD  predniSONE (DELTASONE) 5 MG tablet Take 5 mg by mouth daily with breakfast.   Yes Historical Provider, MD  traMADol (ULTRAM) 50 MG tablet Take 1-2 tablets by mouth every 6 hours as needed for pain 08/18/14  Yes Lafayette Dragon, MD   Physical Exam: Filed Vitals:   09/24/14 1545 09/24/14 1600 09/24/14 1725 09/24/14 1807  BP:   147/95 145/97  Pulse: 121 119 126 131  Temp:    100.1 F (37.8 C)  TempSrc:    Oral  Resp:   16   Height:   5' 8"  (1.727 m) 5' 8"  (1.727 m)  Weight:   66.679 kg (147 lb) 67.6 kg (149 lb 0.5 oz)  SpO2: 92% 92% 97% 100%    Wt Readings from Last 3 Encounters:  09/24/14 67.6 kg (149 lb 0.5 oz)  09/03/14 67.042 kg (147 lb 12.8 oz)  09/01/14 67.132 kg (148 lb)    General:  Well-developed well-nourished in no acute cardiopulmonary  distress. Speaking in full sentences.  Eyes: PERRLA, EOMI, normal lids, irises & conjunctiva ENT: grossly normal hearing, lips & tongue, dry mucous membranes. Neck: no LAD, masses or thyromegaly Cardiovascular: Tachycardic no m/r/g. No LE edema. Telemetry: Sinus tachycardia Respiratory: Some scattered coarse breath sounds. Decreased breath sounds in the left base.  Abdomen: soft, nondistended, positive bowel sounds, tender to palpation in the right lower quadrant. Skin: no rash or induration seen on limited exam Musculoskeletal: grossly normal tone BUE/BLE Psychiatric: grossly normal mood and affect, speech fluent and appropriate Neurologic: Alert and oriented x3. Cranial nerves II through XII are grossly intact. Sensation is intact. Visual fields are intact. Gait not tested secondary to safety.           Labs on Admission:  Basic Metabolic Panel:  Recent Labs Lab 09/22/14 1021 09/24/14 1126  NA 136* 137  K 4.7 3.9  CL 99 98  CO2 22  24  GLUCOSE 111* 111*  BUN 15 18  CREATININE 0.62 0.70  CALCIUM 9.1 9.3   Liver Function Tests:  Recent Labs Lab 09/22/14 1021 09/24/14 1126  AST 13 23  ALT 14 25  ALKPHOS 60 65  BILITOT 0.4 0.4  PROT 7.9 8.3  ALBUMIN 3.5 3.5    Recent Labs Lab 09/22/14 1021  LIPASE 32   No results found for this basename: AMMONIA,  in the last 168 hours CBC:  Recent Labs Lab 09/22/14 1021 09/24/14 1126  WBC 9.0 12.7*  NEUTROABS 6.5 10.3*  HGB 12.7 13.6  HCT 37.8 39.8  MCV 84.6 84.3  PLT 328 360   Cardiac Enzymes: No results found for this basename: CKTOTAL, CKMB, CKMBINDEX, TROPONINI,  in the last 168 hours  BNP (last 3 results) No results found for this basename: PROBNP,  in the last 8760 hours CBG: No results found for this basename: GLUCAP,  in the last 168 hours  Radiological Exams on Admission: Ct Angio Chest Pe W/cm &/or Wo Cm  09/24/2014   CLINICAL DATA:  Persistent tachycardia recent diagnosis of pleural effusion. Evaluate  for pulmonary embolism. History of Crohn disease.  EXAM: CT ANGIOGRAPHY CHEST WITH CONTRAST  TECHNIQUE: Multidetector CT imaging of the chest was performed using the standard protocol during bolus administration of intravenous contrast. Multiplanar CT image reconstructions and MIPs were obtained to evaluate the vascular anatomy.  CONTRAST:  170m OMNIPAQUE IOHEXOL 350 MG/ML SOLN  COMPARISON:  CT abdomen and pelvis 09/22/2014  FINDINGS: This is a satisfactory evaluation of the pulmonary arterial tree. No focal filling defects are identified in the pulmonary arteries.  There is a reactive-size prevascular mediastinal lymph node that measures 9.5 mm. Thoracic aorta is normal in caliber and enhancement. Negative for aortic dissection. Heart size is normal.  There is a moderate sized simple left pleural effusion. Negative for right pleural effusion or pericardial effusion. Esophagus is unremarkable.  There is stranding in the mediastinal fat to the left of the heart, at the cardiophrenic angle.  Lung windows demonstrate extensive multifocal pulmonary parenchymal airspace opacities, with areas of consolidation. Airspace disease is seen with the upper lobes and both lower lobes. No definite right middle lobe airspace disease. There is architectural distortion and scarring in both upper lobes, this appears similar to chest radiograph of November 2013. Definite the etiology for prior scarring is uncertain.  There are bilateral subpectoral breast implants. Soft tissues of both breasts are symmetric. Negative for normal axillary lymphadenopathy.  No acute findings are seen in the imaged portion of the upper abdomen.  No acute osseous abnormality.  Review of the MIP images confirms the above findings.  IMPRESSION: 1. Bilateral multifocal, multilobar airspace disease is most consistent with pneumonia. Reactive sized pre-vascular lymph node. 2. Moderate-sized simple left pleural effusion. 3. Stranding in the epicardial fat pad.  This finding is nonspecific, but has been reported in the setting of mediastinal fat necrosis, which can be reported in patients with pneumonia. 4. Architectural distortion/scarring in the upper lobes bilaterally. This appearance was present on a chest radiograph of 2013. The cause for prior parenchymal pulmonary scarring is unknown. 5. Negative for pulmonary embolism.   Electronically Signed   By: SCurlene DolphinM.D.   On: 09/24/2014 16:46    EKG: Independently reviewed. Sinus tachycardia  Assessment/Plan Principal Problem:   PNA (pneumonia) Active Problems:   Abdominal pain   Crohn's regional enteritis   Gastroesophageal reflux disease without esophagitis   Pleural effusion   Nausea  with vomiting   Leukocytosis   Sinus tachycardia   Dehydration   Colitis:???  #1 bilateral multifocal pneumonia Patient noted to have a bilateral multifocal pneumonia noted on CT of the chest. Patient with a history of Crohn's disease on Remicade as well as chronic steroid therapy. We'll admit patient to telemetry. Check a sputum Gram stain and culture. Check blood cultures x2. Check a urine Legionella antigen. Check a urine pneumococcus antigen. Placed on oxygen, scheduled nebulizers, Mucinex, IV vancomycin and IV Levaquin as patient has been on Remicade and chronic steroid therapy. Follow.  #2 left pleural effusion Left pleural effusion noted on CT of the chest. May be secondary to problem #1. Patient will need a diagnostic and therapeutic thoracentesis. Check blood cultures x2. Check LDH. Continue empiric IV vancomycin IV Levaquin. Pulmonary has been consulted spoke with Dr. Nelda Marseille of [facility] and patient will be seen.   #3 abnormal CT of the abdomen and pelvis from 09/22/2014/history of Crohn's disease/probable colitis Patient with nausea emesis and diarrhea per patient. Patient also with right lower quadrant abdominal pain. Patient was on chronic steroid therapy. Will check stool for C. difficile PCR. Will  place empirically on IV Flagyl. Will double home dose of prednisone to 10 mg daily. Place on clear liquids. Consulted with [facility] Druscilla Brownie) for further evaluation and management.   #4 sinus tachycardia EKG with sinus tachycardia. Likely secondary to dehydration and bilateral multifocal pneumonia and left pleural effusion. Check blood cultures x2. Place on IV fluids. Placed empirically on IV antibiotics. Follow.  #5 dehydration IV fluids.  #6 leukocytosis Likely secondary to problem #1 and possibly probable colitis. Urinalysis was negative. Check blood cultures x2. Placed empirically on IV antibiotics. Follow.  #7 gastroesophageal reflux disease PPI.  #8 prophylaxis  PPI for GI prophylaxis. Lovenox for DVT prophylaxis.   Code Status: Full DVT Prophylaxis: Lovenox Family Communication: Updated patient no family present. Disposition Plan: Admit to telemetry.  Time spent: 67 mins  [facility] Pager [phone]

## 2014-09-24 NOTE — ED Notes (Signed)
PA at bedside.

## 2014-09-24 NOTE — ED Provider Notes (Signed)
CSN: [id number]     Arrival date & time 09/24/14  1105 History   First MD Initiated Contact with Patient 09/24/14 1131     Chief Complaint  Patient presents with  . Nausea  . Emesis  . Diarrhea     (Consider location/radiation/quality/duration/timing/severity/associated sxs/prior Treatment) HPI Comments: Meghan Charles is a 43 y.o. female with a PMHx of anxiety, colonic polyps, and crohn's disease managed by Dr. Hilarie Fredrickson and currently undergoing induction of Remicade therapy, who presents to the ED for ongoing N/V/D since Saturday for which she was seen on Tuesday. She initially had intermittent fevers beginning Saturday, along with N/V/D and abd pain for which she presented to the ED Tuesday. Pt states she was given zofran and morphine in the ED on Tuesday and had improvement of symptoms, and a CT of her abdomen thickening of her ileum consistent with crohns but her labs were not concerning for anything else. States that the CT also showed "fluid on my lung" for which she was given azithromycin, but that she didn't have any SOB or CP at that time that would have indicated she had anything ongoing in her lungs. She states that upon discharge she wasn't given nausea or pain medications. Called Dr. Vena Rua office yesterday and they stated they didn't want to change any of her GI medications (takes tramadol and prednisone) and wanted to continue with Remicade therapies. She states that today she continued to have reoccurrence of her N/V, 3 episodes today, nonbloody nonbilious with only stomach contents. States she can't keep fluids or food down. Reports that this is the same as it was on Tuesday. Reports that her RLQ abd pain is improving since Tuesday. Reports now she has some mild epigastric pain 7/10, described as tightness, which only occurs with vomiting and quickly subsides after she's done vomiting. Additionally she had some diarrhea, loose stools, but no different than her typical diarrhea. Denies  melena or hematochezia. States her pain is unrelieved by home tramadol and tylenol.   When questioned further regarding the "fluid" in her lung and whether she had any symptoms, she states her L lateral rib cage (which is where the "fluid" was) has been mildly sore and she's had a dry nonproductive intermittent cough, which is improving and was the only symptom present prior to Tuesday's CT finding, but she denies any CP, SOB, hemoptysis, wheezing, LE swelling, hx of DVT/PE, or recent travel/sick contacts. States the pain is improving overall since Tuesday. Denies ongoing fever, chills, URI symptoms, diaphoresis, constipation, obstipation, urinary complaints or changes, vaginal symptoms, hematochezia, hematemesis, melena, dizziness, lightheadedness, back/jaw/arm pain, sick contacts, prior abd surgeries, or suspicious food intake. Pt states overall she feels as her symptoms are improving aside from her nausea/vomiting.  Patient is a 44 y.o. female presenting with vomiting. The history is provided by the patient. No language interpreter was used.  Emesis Severity:  Mild Duration:  4 days Timing:  Intermittent Number of daily episodes:  3 Quality:  Stomach contents Progression:  Unchanged Chronicity:  Recurrent Recent urination:  Normal Context: self-induced (states "that's the only way I can vomit")   Relieved by:  Antiemetics (given zofran on Tuesday which helped) Exacerbated by: food and liquids. Ineffective treatments:  None tried Associated symptoms: abdominal pain (chronic RLQ abd pain, improving, mild), cough (dry nonproductive ongoing for several days), diarrhea (loose) and fever (intermittent since Saturday but currently afebrile)   Associated symptoms: no arthralgias, no chills, no headaches, no myalgias, no sore throat and no URI  Risk factors: no alcohol use, no prior abdominal surgery, no sick contacts, no suspect food intake and no travel to endemic areas     Past Medical History   Diagnosis Date  . Gallbladder polyp   . Anxiety   . Colon polyps 05/2014    TUBULAR ADENOMA AND HYPERPLASTIC POLYP.  . Crohn's disease    Past Surgical History  Procedure Laterality Date  . Other surgical history      heel surgery  . Wisdom teeth extracted    . Right heel surgery      with plates and screws  . Colonoscopy w/ biopsies  2015   Family History  Problem Relation Age of Onset  . Colon cancer Maternal Grandmother 51  . Esophageal cancer Neg Hx   . Stomach cancer Neg Hx   . Rectal cancer Neg Hx   . Pancreatic cancer Neg Hx    History  Substance Use Topics  . Smoking status: Never Smoker   . Smokeless tobacco: Never Used  . Alcohol Use: 1.5 oz/week    3 drink(s) per week     Comment: 3 per week   OB History   Grav Para Term Preterm Abortions TAB SAB Ect Mult Living                 Review of Systems  Constitutional: Negative for fever, chills and diaphoresis.  HENT: Negative for congestion, postnasal drip, rhinorrhea, sinus pressure, sore throat and trouble swallowing.   Respiratory: Positive for cough (dry nonproductive). Negative for chest tightness, shortness of breath and wheezing.   Cardiovascular: Negative for chest pain, palpitations and leg swelling.  Gastrointestinal: Positive for nausea, vomiting, abdominal pain (chronic RLQ abd pain, improving, mild) and diarrhea (loose). Negative for constipation, blood in stool and abdominal distention.  Genitourinary: Negative for dysuria, urgency, hematuria, flank pain, decreased urine volume, vaginal bleeding, vaginal discharge, vaginal pain and pelvic pain.  Musculoskeletal: Negative for arthralgias, back pain and myalgias.  Skin: Negative for color change and rash.  Neurological: Negative for dizziness, syncope, weakness, light-headedness, numbness and headaches.  Hematological: Negative for adenopathy.    10 Systems reviewed and are negative for acute change except as noted in the HPI.   Allergies   Review of patient's allergies indicates no known allergies.  Home Medications   Prior to Admission medications   Medication Sig Start Date End Date Taking? Authorizing Provider  azithromycin (ZITHROMAX) 250 MG tablet Take 1 tablet (250 mg total) by mouth daily. Take first 2 tablets together, then 1 every day until finished. 09/22/14   Merryl Hacker, MD  calcium carbonate 1250 MG capsule Take 1,250 mg by mouth daily.    Historical Provider, MD  Cholecalciferol (VITAMIN D) 1000 UNITS capsule Take 1,000 Units by mouth daily.    Historical Provider, MD  ondansetron (ZOFRAN) 4 MG tablet Take 1 tablet (4 mg total) by mouth every 6 (six) hours as needed for nausea or vomiting. 07/22/14   Jerene Bears, MD  OVER THE COUNTER MEDICATION Place 2 drops into both eyes daily as needed (dry eyes). [employer] brand eye drops for red/itchy eyes.    Historical Provider, MD  predniSONE (DELTASONE) 5 MG tablet Take 5 mg by mouth daily with breakfast.    Historical Provider, MD  traMADol (ULTRAM) 50 MG tablet Take 1-2 tablets by mouth every 6 hours as needed for pain 08/18/14   Lafayette Dragon, MD   BP 140/100  Pulse 132  Temp(Src) 98.9 F (37.2 C) (  Oral)  Resp 16  SpO2 98% Physical Exam  Nursing note and vitals reviewed. Constitutional: She is oriented to person, place, and time. She appears well-developed and well-nourished.  Non-toxic appearance. No distress.  Nontoxic, afebrile, tachycardic but otherwise VSS  HENT:  Head: Normocephalic and atraumatic.  Nose: Nose normal.  Mouth/Throat: Oropharynx is clear and moist. Mucous membranes are dry.  Mildly dry mucous membranes  Eyes: Conjunctivae and EOM are normal. Right eye exhibits no discharge. Left eye exhibits no discharge.  Neck: Normal range of motion. Neck supple.  Cardiovascular: Regular rhythm, normal heart sounds and intact distal pulses.  Tachycardia present.  Exam reveals no gallop and no friction rub.   No murmur heard. Tachycardic but regular  rhythm, nl s1/s2 with no m/r/g  Pulmonary/Chest: Effort normal. Not tachypneic. No respiratory distress. She has decreased breath sounds in the left lower field. She has no wheezes. She has no rhonchi. She has no rales. She exhibits tenderness. She exhibits no bony tenderness, no crepitus and no retraction.  LLF with decreased breath sounds but no wheezes/rhonchi/rales. Effort WNL with no distress or increased work of breathing. Oxygenation 98% on RA. Mild L lateral rib cage tenderness at the costal margin in mid-axillary line. No retraction or deformity, no subQ air  Abdominal: Soft. Normal appearance and bowel sounds are normal. She exhibits no distension. There is tenderness (trace) in the right lower quadrant. There is no rigidity, no rebound, no guarding, no CVA tenderness, no tenderness at McBurney's point and negative Murphy's sign.  Trace TTP in RLQ which pt states is chronic and improved from Tuesday. Soft, nondistended, no r/g/r, +BS throughout, neg murphy's, neg mcburney's, no CVA TTP although L lateral rib cage tender as noted above  Musculoskeletal: Normal range of motion.  MAE x4 Neg homan's bilaterally, no pedal edema noted  Lymphadenopathy:    She has no cervical adenopathy.  Neurological: She is alert and oriented to person, place, and time. She has normal strength. No sensory deficit.  Skin: Skin is warm, dry and intact. No rash noted.  Psychiatric: She has a normal mood and affect.    ED Course  Procedures (including critical care time) Labs Review Labs Reviewed  CBC WITH DIFFERENTIAL - Abnormal; Notable for the following:    WBC 12.7 (*)    Neutrophils Relative % 81 (*)    Neutro Abs 10.3 (*)    Lymphocytes Relative 8 (*)    Monocytes Absolute 1.3 (*)    All other components within normal limits  COMPREHENSIVE METABOLIC PANEL - Abnormal; Notable for the following:    Glucose, Bld 111 (*)    All other components within normal limits  URINALYSIS, ROUTINE W REFLEX  MICROSCOPIC - Abnormal; Notable for the following:    Color, Urine AMBER (*)    APPearance CLOUDY (*)    Specific Gravity, Urine >1.046 (*)    Bilirubin Urine SMALL (*)    Ketones, ur 40 (*)    Protein, ur 30 (*)    All other components within normal limits  URINE MICROSCOPIC-ADD ON - Abnormal; Notable for the following:    Squamous Epithelial / LPF FEW (*)    Bacteria, UA FEW (*)    All other components within normal limits  CULTURE, BLOOD (ROUTINE X 2)  CULTURE, BLOOD (ROUTINE X 2)  CLOSTRIDIUM DIFFICILE BY PCR  PREGNANCY, URINE  POC URINE PREG, ED    Imaging Review Ct Angio Chest Pe W/cm &/or Wo Cm  09/24/2014   CLINICAL DATA:  Persistent tachycardia recent diagnosis of pleural effusion. Evaluate for pulmonary embolism. History of Crohn disease.  EXAM: CT ANGIOGRAPHY CHEST WITH CONTRAST  TECHNIQUE: Multidetector CT imaging of the chest was performed using the standard protocol during bolus administration of intravenous contrast. Multiplanar CT image reconstructions and MIPs were obtained to evaluate the vascular anatomy.  CONTRAST:  147mL OMNIPAQUE IOHEXOL 350 MG/ML SOLN  COMPARISON:  CT abdomen and pelvis 09/22/2014  FINDINGS: This is a satisfactory evaluation of the pulmonary arterial tree. No focal filling defects are identified in the pulmonary arteries.  There is a reactive-size prevascular mediastinal lymph node that measures 9.5 mm. Thoracic aorta is normal in caliber and enhancement. Negative for aortic dissection. Heart size is normal.  There is a moderate sized simple left pleural effusion. Negative for right pleural effusion or pericardial effusion. Esophagus is unremarkable.  There is stranding in the mediastinal fat to the left of the heart, at the cardiophrenic angle.  Lung windows demonstrate extensive multifocal pulmonary parenchymal airspace opacities, with areas of consolidation. Airspace disease is seen with the upper lobes and both lower lobes. No definite right middle  lobe airspace disease. There is architectural distortion and scarring in both upper lobes, this appears similar to chest radiograph of November 2013. Definite the etiology for prior scarring is uncertain.  There are bilateral subpectoral breast implants. Soft tissues of both breasts are symmetric. Negative for normal axillary lymphadenopathy.  No acute findings are seen in the imaged portion of the upper abdomen.  No acute osseous abnormality.  Review of the MIP images confirms the above findings.  IMPRESSION: 1. Bilateral multifocal, multilobar airspace disease is most consistent with pneumonia. Reactive sized pre-vascular lymph node. 2. Moderate-sized simple left pleural effusion. 3. Stranding in the epicardial fat pad. This finding is nonspecific, but has been reported in the setting of mediastinal fat necrosis, which can be reported in patients with pneumonia. 4. Architectural distortion/scarring in the upper lobes bilaterally. This appearance was present on a chest radiograph of 2013. The cause for prior parenchymal pulmonary scarring is unknown. 5. Negative for pulmonary embolism.   Electronically Signed   By: Curlene Dolphin M.D.   On: 09/24/2014 16:46   Ct Abdomen Pelvis W Contrast  09/22/2014   CLINICAL DATA:  Fever, chills, nausea.  Crohn's disease.  EXAM: CT ABDOMEN AND PELVIS WITH CONTRAST  TECHNIQUE: Multidetector CT imaging of the abdomen and pelvis was performed using the standard protocol following bolus administration of intravenous contrast.  CONTRAST:  197mL OMNIPAQUE IOHEXOL 300 MG/ML  SOLN  COMPARISON:  CT abdomen 11/04/2012  FINDINGS: Small to moderate left pleural effusion with mild left lower lobe atelectasis.  Liver gallbladder and bile ducts are normal. Pancreas and spleen are normal. Right adrenal lesion measures 18 x 28 mm and is stable from prior studies consistent with adenoma. This has diffusely low attenuation compared to the liver.  Thickened ileum is present in the pelvis. This is  approximately 10 cm proximal to the ileal cecal valve. The terminal ileum is not involved. There is a 15 cm segment of thickened ileum present in the pelvis which has progressed since 2013. This is most consistent with diagnosis of Crohn's disease. This is not causing bowel obstruction. Colon not involved. Appendix normal.  Negative for abscess.  No free fluid or adenopathy.  IMPRESSION: Right adrenal adenoma stable  Thickened loop of ileum in the pelvis consist with Crohn's disease not causing obstruction. No abscess or free fluid. Progressive thickening of the ileum since 2013. The  terminal ileum appears spared.   Electronically Signed   By: Franchot Gallo M.D.   On: 09/22/2014 13:12    EKG Interpretation None      MDM   Final diagnoses:  Community acquired pneumonia  Non-intractable vomiting with nausea, vomiting of unspecified type  Pleural effusion    43y/o female with crohn's here for n/v. States abd pain and diarrhea are chronic and improving. Here for symptomatic control of n/v. Will attempt to control symptoms, doubt need for repeat abd/pelvis CT given recent neg CT. Will check labs and eval if any acute changes are present. Doubt need for repeat CXR given that pt had small pleural effusion as incidental finding on CT, given zpack and not having pleuritic CP or SOB, oxygenating well on RA, with no clinical signs of DVT. Pt is tachycardic, but likely related to dehydration. Will give fluids and morphine/zofran and reassess shortly. Plan to PO challenge and hope to discharge shortly.  1:50 PM Labs reveal mildly elevated WBC at 12.7 likely related to dehydration and vomiting, or hemoconcentration given that Hgb higher than it was 2 days ago, doubt any acute infectious process at this time since pt is afebrile. CMP unremarkable. U/A showing dehydration but no signs of infection. Upreg neg. Pain improved, nausea improved, tolerating PO well. Fluids given and tachycardia improving but still  having tachycardia in 110s, oxygenation now >95% on RA but has come down some from initial presentation. Continues to state she's not having SOB or pleuritic CP, feels fine, and oxygenation does not come down below 95%. Will monitor, continue to doubt PE, but will give one more bolus and see if tachycardia resolves, and if it doesn't this could be suspicious for PE. Hope to plan for d/c with nausea and pain control, f/up with PCP/Dr. Hilarie Fredrickson. Will ambulate prior to d/c to ensure pt sats remain stable.  3:15 PM Pt feeling nauseated, will repeat zofran. Pt seen putting finger in her throat to vomit, when asked she states "this is the only way I can vomit". Repeat VS showing persistent tachycardia after 2L boluses, and now oxygenation down to 92% on RA at rest. Although pt is asymptomatic, VS more concerning for PE and will proceed with CT angio chest to r/o PE. Dimer likely to be low yield given that pt has other inflammatory processes and risk at this point is higher, therefore decision made to proceed with CT. Will monitor closely and reassess. Pain currently controlled.  5:13 PM CT chest revealing worsening pleural effusion with multifocal PNA, no PE. Will get blood cultures and start levaquin, place on continuous cardiac monitoring and pulse ox. Pt continues to remain stable at this time but is having continued tachycardia and SpO2 now at 91% on RA. Consulted triad, Dr. Irine Seal returning page and agrees to admit pt for CAP. Will obtain EKG now. Pt aware of admission, and agrees with plan. N/V and abd pain controlled at this time. Admission orders placed, care assumed by admitting physician at this time.  BP 138/90  Pulse 119  Temp(Src) 98 F (36.7 C) (Oral)  Resp 18  SpO2 92%  Meds ordered this encounter  Medications  . sodium chloride 0.9 % bolus 1,000 mL    Sig:   . ondansetron (ZOFRAN) injection 4 mg    Sig:   . morphine 4 MG/ML injection 4 mg    Sig:   . sodium chloride 0.9 %  bolus 1,000 mL    Sig:   . ondansetron (ZOFRAN)  injection 4 mg    Sig:   . iohexol (OMNIPAQUE) 350 MG/ML injection 100 mL    Sig:   . sodium chloride 0.9 % bolus 1,000 mL    Sig:      Patty Sermons Camprubi-Soms, PA-C 09/24/14 1719

## 2014-09-24 NOTE — ED Notes (Signed)
Tolerating fluids well. Will reevaluate promptly.

## 2014-09-24 NOTE — ED Notes (Signed)
Blood cultures done by [facility].Marland Kitchen

## 2014-09-24 NOTE — Progress Notes (Signed)
Utilization Review completed.  Obinna Ehresman RN CM  

## 2014-09-24 NOTE — ED Notes (Signed)
Pt states she was here on Tuesday for same complaint.  States she has been having NVD since Saturday. States that she has been having fevers up to 102 at home.  Pt has hx of crohn's.

## 2014-09-24 NOTE — ED Notes (Signed)
Patient transported to CT 

## 2014-09-24 NOTE — Progress Notes (Signed)
Pt instructed on use of IS and Flutter Valve for pulmonary toiletry.  Pt demonstrated proper technique with flutter valve X's 10 breaths and achieved 1100 on IS.  Pt instructed to use every hour while awake.  RT to monitor and assess as needed.

## 2014-09-24 NOTE — Consult Note (Signed)
Name: Meghan Charles MRN: [id number] DOB: [birth year]    ADMISSION DATE:  09/24/2014 CONSULTATION DATE:  09/24/14  REFERRING MD :  Meghan Charles  CHIEF COMPLAINT:  Fever, nausea, vomiting, diarrhea  BRIEF PATIENT DESCRIPTION: 43 y.o. F brought to ED with fever, nausea, vomiting x 5 days.  Found to have bilateral PNA and left pleural effusion.  PCCM consulted for recs.  SIGNIFICANT EVENTS  10/6 - presented to ED and discharged with Zpack. 10/8 - returns to ED with same symptoms, admitted with PNA and left pleural effusion.  STUDIES:  CT abd/pelvis 10/6 >>> right adrenal adenoma is stable, thickened loop of ileum in the pelvis c/w Crohn's, not causing obstruction.  Progressive thickening of ileum since 2013. CTA Chest 10/8 >>> b/l multifocal PNA, reactive lymph nod, moderate sized simple left pleural effusion.  Negative for PE.   HISTORY OF PRESENT ILLNESS:  Ms. Meghan Charles is a 43 y.o. F with PMH as outlined below and who presented to the [facility] ED on 10/8 with fever, nausea, vomiting x 5 days.  TMax 5 days ago was reportedly 102.  She was actually seen in our ED 2 days ago for the same.  CT of abdo/pelvis was done at the time which showed thickening around colon as well as a left pleural effusion.  She was discharged with a Z-pack which did not provide much relief. On day of presentation, she continued to have above symptoms along with diarrhea and RLQ pain (unchanged from her chronic abdominal pain secondary to Crohn's).  She denies any chst pain.  Over past 2 days has developed mild cough, non-productive as well as some exertional dyspnea. While in ED, she was noted to have temp of 100.1 and HR or 132.  CBC was remarkable for WBC of 12.7 with left shift. CTA of the chest was obtained and revealed bilateral multifocal PNA as well as a moderate sized left pleural effusion. PCCM was consulted for recs.  PAST MEDICAL HISTORY :   has a past medical history of Gallbladder polyp; Anxiety; Colon polyps  (05/2014); and Crohn's disease.  has past surgical history that includes Other surgical history; wisdom teeth extracted; Right Heel surgery; and Colonoscopy w/ biopsies (2015). Prior to Admission medications   Medication Sig Start Date End Date Taking? Authorizing Provider  azithromycin (ZITHROMAX) 250 MG tablet Take 1 tablet (250 mg total) by mouth daily. Take first 2 tablets together, then 1 every day until finished. 09/22/14  Yes Merryl Hacker, MD  calcium carbonate 1250 MG capsule Take 1,250 mg by mouth daily.   Yes Historical Provider, MD  Cholecalciferol (VITAMIN D) 1000 UNITS capsule Take 1,000 Units by mouth daily.   Yes Historical Provider, MD  ondansetron (ZOFRAN) 4 MG tablet Take 1 tablet (4 mg total) by mouth every 6 (six) hours as needed for nausea or vomiting. 07/22/14  Yes Jerene Bears, MD  OVER THE COUNTER MEDICATION Place 2 drops into both eyes daily as needed (dry eyes). [employer] brand eye drops for red/itchy eyes.   Yes Historical Provider, MD  predniSONE (DELTASONE) 5 MG tablet Take 5 mg by mouth daily with breakfast.   Yes Historical Provider, MD  traMADol (ULTRAM) 50 MG tablet Take 1-2 tablets by mouth every 6 hours as needed for pain 08/18/14  Yes Lafayette Dragon, MD   No Known Allergies  FAMILY HISTORY:  family history includes Colon cancer (age of onset: 41) in her maternal grandmother. There is no history of Esophageal cancer, Stomach cancer, Rectal cancer,  or Pancreatic cancer. SOCIAL HISTORY:  reports that she has never smoked. She has never used smokeless tobacco. She reports that she drinks alcohol. She reports that she does not use illicit drugs.  REVIEW OF SYSTEMS:   All negative; except for those that are bolded, which indicate positives.  Constitutional: weight loss, weight gain, night sweats, fevers, chills, fatigue, weakness.  HEENT: headaches, sore throat, sneezing, nasal congestion, post nasal drip, difficulty swallowing, tooth/dental problems, visual  complaints, visual changes, ear aches. Neuro: difficulty with speech, weakness, numbness, ataxia. CV:  chest pain, orthopnea, PND, swelling in lower extremities, dizziness, palpitations, syncope.  Resp: cough, hemoptysis, dyspnea, wheezing. GI  heartburn, indigestion, abdominal pain, nausea, vomiting, diarrhea, constipation, change in bowel habits, loss of appetite, hematemesis, melena, hematochezia.  GU: dysuria, change in color of urine, urgency or frequency, flank pain, hematuria. MSK: joint pain or swelling, decreased range of motion. Psych: change in mood or affect, depression, anxiety, suicidal ideations, homicidal ideations. Skin: rash, itching, bruising.   SUBJECTIVE:   VITAL SIGNS: Temp:  [98 F (36.7 C)-100.1 F (37.8 C)] 100.1 F (37.8 C) (10/08 1807) Pulse Rate:  [109-132] 131 (10/08 1807) Resp:  [16-18] 16 (10/08 1725) BP: (129-147)/(77-100) 145/97 mmHg (10/08 1807) SpO2:  [92 %-100 %] 96 % (10/08 1954) Weight:  [66.679 kg (147 lb)-67.6 kg (149 lb 0.5 oz)] 67.6 kg (149 lb 0.5 oz) (10/08 1807)  PHYSICAL EXAMINATION: General: WDWN female, resting in bed, in NAD. Neuro: A&O x 3, non-focal.  HEENT: Normal/AT. PERRL, sclerae anicteric. Cardiovascular: RRR, no M/R/G.  Lungs: Respirations even and unlabored.  Scattered rhonchi bilaterally, diminished BS in left base. Abdomen: BS x 4, soft, NT/ND.  Musculoskeletal: No gross deformities, no edema.  Skin: Intact, warm, no rashes.     Recent Labs Lab 09/22/14 1021 09/24/14 1126  NA 136* 137  K 4.7 3.9  CL 99 98  CO2 22 24  BUN 15 18  CREATININE 0.62 0.70  GLUCOSE 111* 111*    Recent Labs Lab 09/22/14 1021 09/24/14 1126  HGB 12.7 13.6  HCT 37.8 39.8  WBC 9.0 12.7*  PLT 328 360   Ct Angio Chest Pe W/cm &/or Wo Cm  09/24/2014   CLINICAL DATA:  Persistent tachycardia recent diagnosis of pleural effusion. Evaluate for pulmonary embolism. History of Crohn disease.  EXAM: CT ANGIOGRAPHY CHEST WITH CONTRAST   TECHNIQUE: Multidetector CT imaging of the chest was performed using the standard protocol during bolus administration of intravenous contrast. Multiplanar CT image reconstructions and MIPs were obtained to evaluate the vascular anatomy.  CONTRAST:  150m OMNIPAQUE IOHEXOL 350 MG/ML SOLN  COMPARISON:  CT abdomen and pelvis 09/22/2014  FINDINGS: This is a satisfactory evaluation of the pulmonary arterial tree. No focal filling defects are identified in the pulmonary arteries.  There is a reactive-size prevascular mediastinal lymph node that measures 9.5 mm. Thoracic aorta is normal in caliber and enhancement. Negative for aortic dissection. Heart size is normal.  There is a moderate sized simple left pleural effusion. Negative for right pleural effusion or pericardial effusion. Esophagus is unremarkable.  There is stranding in the mediastinal fat to the left of the heart, at the cardiophrenic angle.  Lung windows demonstrate extensive multifocal pulmonary parenchymal airspace opacities, with areas of consolidation. Airspace disease is seen with the upper lobes and both lower lobes. No definite right middle lobe airspace disease. There is architectural distortion and scarring in both upper lobes, this appears similar to chest radiograph of November 2013. Definite the etiology for prior  scarring is uncertain.  There are bilateral subpectoral breast implants. Soft tissues of both breasts are symmetric. Negative for normal axillary lymphadenopathy.  No acute findings are seen in the imaged portion of the upper abdomen.  No acute osseous abnormality.  Review of the MIP images confirms the above findings.  IMPRESSION: 1. Bilateral multifocal, multilobar airspace disease is most consistent with pneumonia. Reactive sized pre-vascular lymph node. 2. Moderate-sized simple left pleural effusion. 3. Stranding in the epicardial fat pad. This finding is nonspecific, but has been reported in the setting of mediastinal fat necrosis,  which can be reported in patients with pneumonia. 4. Architectural distortion/scarring in the upper lobes bilaterally. This appearance was present on a chest radiograph of 2013. The cause for prior parenchymal pulmonary scarring is unknown. 5. Negative for pulmonary embolism.   Electronically Signed   By: Curlene Dolphin M.D.   On: 09/24/2014 16:46    ASSESSMENT / PLAN:  CAP Left pleural effusion At risk relative AI - pt on chronic steroid therapy for Crohn's. Recs: Supplemental O2 as needed to maintain SpO2 > 92%. Continue empiric abx. Follow cultures. Continue prednisone at 34m. Pulmonary toiletry. Coags in AM. CXR in AM. Plan for diagnostic and therapeutic thoracentesis in AM, unless CXR shows resolution in size of effusion and / or pt has good improvement in symptoms with above therapy.   RMontey Hora PByronPulmonary & Critical Care Medicine Pgr: [phone] or [phone] Agree, see my note from 10/9  BRoselie Awkward MD LWall LakePCCM Pager: [phone]: [phone] no response, call [phone]

## 2014-09-24 NOTE — Progress Notes (Addendum)
ANTIBIOTIC CONSULT NOTE - INITIAL  Pharmacy Consult for:  Levaquin and Vancomycin Indication:  Community-acquired pneumonia  No Known Allergies  Patient Measurements: Height: 5\' 8"  (172.7 cm) Weight: 149 lb 0.5 oz (67.6 kg) IBW/kg (Calculated) : 63.9   Vital Signs: Temp: 100.1 F (37.8 C) (10/08 1807) Temp Source: Oral (10/08 1807) BP: 145/97 mmHg (10/08 1807) Pulse Rate: 131 (10/08 1807)   Labs:  Recent Labs  09/22/14 1021 09/24/14 1126  WBC 9.0 12.7*  HGB 12.7 13.6  PLT 328 360  CREATININE 0.62 0.70   Estimated Creatinine Clearance: 91.5 ml/min (by C-G formula based on Cr of 0.7).   Microbiology: Blood cultures are pending.  Medical History: Past Medical History  Diagnosis Date  . Gallbladder polyp   . Anxiety   . Colon polyps 05/2014    TUBULAR ADENOMA AND HYPERPLASTIC POLYP.  . Crohn's disease     Medications:  Scheduled:  . calcium carbonate  1,250 mg Oral Daily  . enoxaparin (LOVENOX) injection  40 mg Subcutaneous Q24H  . levofloxacin (LEVAQUIN) IV  750 mg Intravenous Q24H  . metronidazole  500 mg Intravenous Q8H  . [START ON 09/25/2014] pantoprazole  40 mg Oral Q0600  . [START ON 10/03/2014] predniSONE  10 mg Oral QAC breakfast  . Vitamin D  1,000 Units Oral Daily   Assessment: Asked to assist with antibiotic therapy -- Levaquin and Vancomycin -- for this 43 year-old female with community-acquired pneumonia and immunocompromised status.  Metronidazole has also been ordered.  Goals of Therapy:   Vancomycin trough levels 15-20 mcg/ml  Antibiotic doses appropriate for renal function  Eradication of infection  Plan:   Levaquin 750 mg IV every 24 hours for 5 days.  Vancomycin 1000 mg x 1 dose, then 750 mg IV every 8 hours  Vancomycin levels as needed to guide dosing decisions  [employer] R.Ph. 09/24/2014,6:39 PM

## 2014-09-24 NOTE — ED Notes (Signed)
It should be noted that pt is putting her fingers down her throat in triage.

## 2014-09-24 NOTE — Progress Notes (Signed)
  CARE MANAGEMENT ED NOTE 09/24/2014  Patient:  Meghan Charles, Meghan Charles   Account Number:  [id number]  Date Initiated:  09/24/2014  Documentation initiated by:  Jackelyn Poling  Subjective/Objective Assessment:   43 yr old [place] pt was here on Tuesday for same complaint.  States she has been having NVD since Saturday. States that she has been having fevers up to 102 at home.  Pt has hx of crohn's.     Subjective/Objective Assessment Detail:   confirms with CM she has no pcp but only sees GI MD Pyrtle    Pt aware of need to obtain "in network doctors"     Action/Plan:   CM spoke with pt see notes below EPIC updated   Action/Plan Detail:   Anticipated DC Date:  09/24/2014     Status Recommendation to Physician:   Result of Recommendation:    Other ED [facility]  Other  PCP issues  Outpatient Services - Pt will follow up    Choice offered to / List presented to:            Status of service:  Completed, signed off  ED Comments:   ED Comments Detail:  [facility] ED CM spoke with pt on how to obtain an in network pcp with insurance coverage via the customer service number or web site Cm reviewed ED level of care for crisis/emergent services and community pcp level of care to manage continuous or chronic medical concerns.  The pt voiced understanding CM encouraged pt and discussed pt's responsibility to verify with pt's insurance carrier that any recommended medical provider offered by any emergency room or a hospital provider is within the carrier's network. The pt voiced understanding

## 2014-09-24 NOTE — ED Notes (Addendum)
Initial contact A&Ox4. Was seen here Tuesday for similar symptoms and given Morphine and Zofran. Was not sent home with anti-nausea medications. Takes Tramadol and Zofran at home. Sent home with [facility]. Denies alleviation of symptoms with Tramadol Hx Crohns disease. Had CT done showing increased thickening around colon. Takes Prednisone and Remicaid. Sees GI doctor currently. Denies blood in vomit, diarrhea. Reports vomiting for 2 hours constantly yesterday. Unable to keep any PO fluids, food down. Denies dysuria, vaginal discharge, urine changes, flatus changes. Denies abdominal surgeries. Temp 101.5 F yesterday. C/o slight mid lower abdominal pain described as tightening only during emesis occurences and immediately after. PA at the bedside. No other questions/concerns.

## 2014-09-25 ENCOUNTER — Inpatient Hospital Stay (HOSPITAL_COMMUNITY): Payer: BC Managed Care – PPO

## 2014-09-25 DIAGNOSIS — K529 Noninfective gastroenteritis and colitis, unspecified: Secondary | ICD-10-CM

## 2014-09-25 DIAGNOSIS — J90 Pleural effusion, not elsewhere classified: Secondary | ICD-10-CM

## 2014-09-25 DIAGNOSIS — K50918 Crohn's disease, unspecified, with other complication: Secondary | ICD-10-CM

## 2014-09-25 DIAGNOSIS — R110 Nausea: Secondary | ICD-10-CM

## 2014-09-25 DIAGNOSIS — D72829 Elevated white blood cell count, unspecified: Secondary | ICD-10-CM

## 2014-09-25 LAB — PROTEIN, BODY FLUID: Total protein, fluid: 4.8 g/dL

## 2014-09-25 LAB — BASIC METABOLIC PANEL
ANION GAP: 14 (ref 5–15)
BUN: 6 mg/dL (ref 6–23)
CALCIUM: 8.2 mg/dL — AB (ref 8.4–10.5)
CO2: 20 meq/L (ref 19–32)
Chloride: 98 mEq/L (ref 96–112)
Creatinine, Ser: 0.61 mg/dL (ref 0.50–1.10)
GFR calc Af Amer: >90 mL/min (ref >90)
GFR calc non Af Amer: >90 mL/min (ref >90)
GLUCOSE: 138 mg/dL — AB (ref 70–99)
Potassium: 3.6 mEq/L — ABNORMAL LOW (ref 3.7–5.3)
Sodium: 132 mEq/L — ABNORMAL LOW (ref 137–147)

## 2014-09-25 LAB — PROTIME-INR
INR: 1.1 (ref 0.00–1.49)
Prothrombin Time: 14.4 seconds (ref 11.6–15.2)

## 2014-09-25 LAB — CBC WITH DIFFERENTIAL/PLATELET
BASOS ABS: 0 10*3/uL (ref 0.0–0.1)
BASOS PCT: 0 % (ref 0–1)
EOS PCT: 0 % (ref 0–5)
Eosinophils Absolute: 0 10*3/uL (ref 0.0–0.7)
HEMATOCRIT: 34.5 % — AB (ref 36.0–46.0)
Hemoglobin: 11.4 g/dL — ABNORMAL LOW (ref 12.0–15.0)
Lymphocytes Relative: 13 % (ref 12–46)
Lymphs Abs: 1.3 10*3/uL (ref 0.7–4.0)
MCH: 27.9 pg (ref 26.0–34.0)
MCHC: 33 g/dL (ref 30.0–36.0)
MCV: 84.4 fL (ref 78.0–100.0)
MONO ABS: 1.1 10*3/uL — AB (ref 0.1–1.0)
Monocytes Relative: 11 % (ref 3–12)
Neutro Abs: 7.2 10*3/uL (ref 1.7–7.7)
Neutrophils Relative %: 76 % (ref 43–77)
Platelets: 343 10*3/uL (ref 150–400)
RBC: 4.09 MIL/uL (ref 3.87–5.11)
RDW: 13.4 % (ref 11.5–15.5)
WBC: 9.7 10*3/uL (ref 4.0–10.5)

## 2014-09-25 LAB — PH, BODY FLUID: PH, FLUID: 7.5

## 2014-09-25 LAB — LACTATE DEHYDROGENASE, PLEURAL OR PERITONEAL FLUID: LD, Fluid: 349 U/L — ABNORMAL HIGH (ref 3–23)

## 2014-09-25 LAB — GLUCOSE, SEROUS FLUID: GLUCOSE FL: 90 mg/dL

## 2014-09-25 LAB — BODY FLUID CELL COUNT WITH DIFFERENTIAL
Lymphs, Fluid: 56 %
Monocyte-Macrophage-Serous Fluid: 23 % — ABNORMAL LOW (ref 50–90)
Neutrophil Count, Fluid: 21 % (ref 0–25)
WBC FLUID: 3413 uL — AB (ref 0–1000)

## 2014-09-25 LAB — HIV ANTIBODY (ROUTINE TESTING W REFLEX): HIV 1&2 Ab, 4th Generation: NONREACTIVE

## 2014-09-25 LAB — EXPECTORATED SPUTUM ASSESSMENT W REFEX TO RESP CULTURE

## 2014-09-25 LAB — STREP PNEUMONIAE URINARY ANTIGEN: Strep Pneumo Urinary Antigen: NEGATIVE

## 2014-09-25 LAB — CLOSTRIDIUM DIFFICILE BY PCR: CDIFFPCR: NEGATIVE

## 2014-09-25 MED ORDER — PROMETHAZINE HCL 25 MG/ML IJ SOLN
12.5000 mg | Freq: Once | INTRAMUSCULAR | Status: AC
  Administered 2014-09-25: 12.5 mg via INTRAVENOUS
  Filled 2014-09-25: qty 1

## 2014-09-25 MED ORDER — ALPRAZOLAM 0.5 MG PO TABS
0.5000 mg | ORAL_TABLET | Freq: Three times a day (TID) | ORAL | Status: DC | PRN
  Administered 2014-09-25 – 2014-09-28 (×5): 0.5 mg via ORAL
  Filled 2014-09-25 (×5): qty 1

## 2014-09-25 MED ORDER — KETOROLAC TROMETHAMINE 30 MG/ML IJ SOLN
30.0000 mg | Freq: Once | INTRAMUSCULAR | Status: AC
  Administered 2014-09-25: 30 mg via INTRAVENOUS
  Filled 2014-09-25: qty 1

## 2014-09-25 MED ORDER — HYDROMORPHONE HCL 2 MG/ML IJ SOLN
2.0000 mg | INTRAMUSCULAR | Status: DC | PRN
  Administered 2014-09-25: 2 mg via INTRAVENOUS
  Filled 2014-09-25: qty 1

## 2014-09-25 MED ORDER — ZOLPIDEM TARTRATE 5 MG PO TABS: 5.0000 mg | ORAL_TABLET | Freq: Once | ORAL | Status: DC

## 2014-09-25 MED ORDER — BOOST / RESOURCE BREEZE PO LIQD
1.0000 | Freq: Three times a day (TID) | ORAL | Status: DC
  Administered 2014-09-25 – 2014-09-27 (×2): 1 via ORAL

## 2014-09-25 NOTE — Progress Notes (Signed)
[facility] PROGRESS NOTE  Meghan Charles AXK:[id number] DOB: [birth year] DOA: 09/24/2014 PCP: No primary provider on file.  Assessment/Plan: 1. Community acquired pneumonia, para pneumonic effusion-  Patient on vancomycin, levaquin, supplemental oxygen, flutter valve, s/p thoracentesis. Pulmonary is following. 2. Para pneumonic effusion- s/p thoracentesis, pleural fluid analysis shows  Wbc 3413, hazy appearance, culture is pending. 3. Crohn's colitis- CT scan done on 10/6  shows thickened loops of ileum consistent with Crohn's disease not causing obstruction. Started on IV Flagyl, prednisone 10 mg po daily. 4. GERD- Continue with protonix daily.  Code Status: *Full code Family Communication: *No family at bedside Disposition Plan: Home when stable   Consultants:  Pulmonary  Procedures:  Thoracentesis  Antibiotics:  Vancomycin 10/8  Levaquin 10/8  HPI/Subjective: 43 y.o. female  With history of Crohn's disease diagnosed in June of 2015 who was started on Remicade and has received one dose on 09/03/2014 on chronic prednisone therapy, anxiety, who presents to the ED with five-day history of intermittent fevers, nausea and emesis.  Patient states that 5 days prior to admission had a temperature of 102 and felt better subsequently after that for the next 2 days. 2 days prior to admission patient stated awoke around 3:15 in the morning noted to have a temperature of 101.7 with some associated nausea nonbilious emesis x3. Patient subsequently presented to the ED which he states was given some pain medications IV fluids and anti-medics. CT scan was done that showed thickening around the colon and are noted left pleural effusion. Patient said at that time was done to develop a nonproductive cough and was subsequently discharged on a Z-Pak. Patient states 1 day prior to admission had a temperature on a 1.5 at work with some nausea and emesis which was ongoing for 2 hours. The day of  admission while at work patient noted she has some nausea and emesis and subsequently presented to the ED. Patient endorses some chills, some associated shortness of breath, nonproductive cough, diarrhea/loose stools with 3-4 episodes one day prior to admission and right lower quadrant abdominal pain she states is unchanged from her chronic abdominal pain secondary to her Crohn's. Patient also endorses some generalized weakness. Patient denies any chest pain, no constipation, no dysuria, no melena, no hematemesis, no hematochezia.  Patient was seen in the emergency room and noted to have a temp of 100.1 with heart rate of 132 and blood pressure of 129/77. Labs obtained had a comprehensive metabolic profile which was unremarkable. CBC obtained had a white count of 12.7 with a left shift. Urine pregnancy test was negative. Urinalysis which was done was cloudy with a specific gravity of greater than 1.046 some ketones some protein nitrite negative leukocytes -0-2 WBCs.  CT angiogram of the chest done showed bilateral multifocal multilobar airspace disease most consistent with pneumonia. Reactive sized prevascular lymph node. Moderate sized simple left pleural effusion. Negative for PE.  We were called to admit the patient for further evaluation and management.   Patient feels better this morning, sweating. Denies shortness of breath.   Objective: Filed Vitals:   09/25/14 0916  BP: 111/79  Pulse:   Temp:   Resp:     Intake/Output Summary (Last 24 hours) at 09/25/14 1231 Last data filed at 09/25/14 0615  Gross per 24 hour  Intake   1500 ml  Output      0 ml  Net   1500 ml   Filed Weights   09/24/14 1725 09/24/14 1807  Weight: 66.679 kg (147  lb) 67.6 kg (149 lb 0.5 oz)    Exam:  Physical Exam: Head: Normocephalic, atraumatic.  Eyes: No signs of jaundice, EOMI Lungs: Normal respiratory effort. Crackles at the left lung base Heart: Regular RR. S1 and S2 normal  Abdomen: BS normoactive.  Soft, Nondistended, non-tender.  Extremities: No pretibial edema, no erythema  Data Reviewed: Basic Metabolic Panel:  Recent Labs Lab 09/22/14 1021 09/24/14 1126 09/24/14 1844 09/25/14 0440  NA 136* 137  --  132*  K 4.7 3.9  --  3.6*  CL 99 98  --  98  CO2 22 24  --  20  GLUCOSE 111* 111*  --  138*  BUN 15 18  --  6  CREATININE 0.62 0.70  --  0.61  CALCIUM 9.1 9.3  --  8.2*  MG  --   --  2.0  --   PHOS  --   --  3.8  --    Liver Function Tests:  Recent Labs Lab 09/22/14 1021 09/24/14 1126  AST 13 23  ALT 14 25  ALKPHOS 60 65  BILITOT 0.4 0.4  PROT 7.9 8.3  ALBUMIN 3.5 3.5    Recent Labs Lab 09/22/14 1021  LIPASE 32   No results found for this basename: AMMONIA,  in the last 168 hours CBC:  Recent Labs Lab 09/22/14 1021 09/24/14 1126 09/25/14 0440  WBC 9.0 12.7* 9.7  NEUTROABS 6.5 10.3* 7.2  HGB 12.7 13.6 11.4*  HCT 37.8 39.8 34.5*  MCV 84.6 84.3 84.4  PLT 328 360 343   Cardiac Enzymes: No results found for this basename: CKTOTAL, CKMB, CKMBINDEX, TROPONINI,  in the last 168 hours BNP (last 3 results) No results found for this basename: PROBNP,  in the last 8760 hours CBG: No results found for this basename: GLUCAP,  in the last 168 hours  Recent Results (from the past 240 hour(s))  CULTURE, BLOOD (ROUTINE X 2)     Status: None   Collection Time    09/24/14  4:58 PM      Result Value Ref Range Status   Specimen Description BLOOD LEFT ANTECUBITAL   Final   Special Requests BOTTLES DRAWN AEROBIC AND ANAEROBIC LAC   Final   Culture  Setup Time     Final   Value: 09/24/2014 23:04     Performed at [facility]   Culture     Final   Value:        BLOOD CULTURE RECEIVED NO GROWTH TO DATE CULTURE WILL BE HELD FOR 5 DAYS BEFORE ISSUING A FINAL NEGATIVE REPORT     Performed at [facility]   Report Status PENDING   Incomplete  CULTURE, BLOOD (ROUTINE X 2)     Status: None   Collection Time    09/24/14  5:03 PM      Result Value Ref  Range Status   Specimen Description BLOOD RIGHT ANTECUBITAL   Final   Special Requests BOTTLES DRAWN AEROBIC AND ANAEROBIC 5 CC   Final   Culture  Setup Time     Final   Value: 09/24/2014 22:50     Performed at [facility]   Culture     Final   Value:        BLOOD CULTURE RECEIVED NO GROWTH TO DATE CULTURE WILL BE HELD FOR 5 DAYS BEFORE ISSUING A FINAL NEGATIVE REPORT     Performed at [facility]   Report Status PENDING   Incomplete  CULTURE, EXPECTORATED SPUTUM-ASSESSMENT     Status: None   Collection Time    09/25/14  6:37 AM      Result Value Ref Range Status   Specimen Description SPUTUM   Final   Special Requests Immunocompromised   Final   Sputum evaluation     Final   Value: MICROSCOPIC FINDINGS SUGGEST THAT THIS SPECIMEN IS NOT REPRESENTATIVE OF LOWER RESPIRATORY SECRETIONS. PLEASE RECOLLECT.     NOTIFIED RN AT 0700 ON 10.9.15 BY SHUEA   Report Status 09/25/2014 FINAL   Final     Studies: Dg Chest 1 View  09/25/2014   CLINICAL DATA:  Initial encounter for left-sided chest pain after left thoracentesis.  EXAM: CHEST - 1 VIEW  COMPARISON:  CT chest from 09/22/2014  FINDINGS: No evidence for pneumothorax. Small left pleural effusion noted. Asymmetric patchy airspace disease has a central predominance, similar to what was seen on the previous CT scan. Imaged bony structures of the thorax are intact. Telemetry leads overlie the chest.  IMPRESSION: No evidence for pneumothorax status post thoracentesis.   Electronically Signed   By: Misty Stanley M.D.   On: 09/25/2014 09:41   Ct Angio Chest Pe W/cm &/or Wo Cm  09/24/2014   CLINICAL DATA:  Persistent tachycardia recent diagnosis of pleural effusion. Evaluate for pulmonary embolism. History of Crohn disease.  EXAM: CT ANGIOGRAPHY CHEST WITH CONTRAST  TECHNIQUE: Multidetector CT imaging of the chest was performed using the standard protocol during bolus administration of intravenous contrast. Multiplanar CT image  reconstructions and MIPs were obtained to evaluate the vascular anatomy.  CONTRAST:  115m OMNIPAQUE IOHEXOL 350 MG/ML SOLN  COMPARISON:  CT abdomen and pelvis 09/22/2014  FINDINGS: This is a satisfactory evaluation of the pulmonary arterial tree. No focal filling defects are identified in the pulmonary arteries.  There is a reactive-size prevascular mediastinal lymph node that measures 9.5 mm. Thoracic aorta is normal in caliber and enhancement. Negative for aortic dissection. Heart size is normal.  There is a moderate sized simple left pleural effusion. Negative for right pleural effusion or pericardial effusion. Esophagus is unremarkable.  There is stranding in the mediastinal fat to the left of the heart, at the cardiophrenic angle.  Lung windows demonstrate extensive multifocal pulmonary parenchymal airspace opacities, with areas of consolidation. Airspace disease is seen with the upper lobes and both lower lobes. No definite right middle lobe airspace disease. There is architectural distortion and scarring in both upper lobes, this appears similar to chest radiograph of November 2013. Definite the etiology for prior scarring is uncertain.  There are bilateral subpectoral breast implants. Soft tissues of both breasts are symmetric. Negative for normal axillary lymphadenopathy.  No acute findings are seen in the imaged portion of the upper abdomen.  No acute osseous abnormality.  Review of the MIP images confirms the above findings.  IMPRESSION: 1. Bilateral multifocal, multilobar airspace disease is most consistent with pneumonia. Reactive sized pre-vascular lymph node. 2. Moderate-sized simple left pleural effusion. 3. Stranding in the epicardial fat pad. This finding is nonspecific, but has been reported in the setting of mediastinal fat necrosis, which can be reported in patients with pneumonia. 4. Architectural distortion/scarring in the upper lobes bilaterally. This appearance was present on a chest  radiograph of 2013. The cause for prior parenchymal pulmonary scarring is unknown. 5. Negative for pulmonary embolism.   Electronically Signed   By: SCurlene DolphinM.D.   On: 09/24/2014 16:46   Dg Chest Port 1 View  09/24/2014  CLINICAL DATA:  Evaluate pleural effusion.  Cough.  EXAM: PORTABLE CHEST - 1 VIEW  COMPARISON:  CT chest 09/24/2014.  Chest radiograph 11/04/2012  FINDINGS: There is a moderate size left pleural effusion. Patchy multifocal bilateral airspace disease in the upper lung fields is noted. There is atelectasis at the left lung base. The heart and mediastinal contours appear stable compared to prior chest radiograph.  Pulmonary scarring/architectural distortion is noted in the upper lung fields, and chronic finding.  IMPRESSION: Bilateral airspace disease is most consistent with bilateral multifocal pneumonia.  Moderate size left pleural effusion, likely parapneumonic.   Electronically Signed   By: Curlene Dolphin M.D.   On: 09/24/2014 21:41    Scheduled Meds: . calcium carbonate  1,250 mg Oral Daily  . cholecalciferol  1,000 Units Oral Daily  . enoxaparin (LOVENOX) injection  40 mg Subcutaneous Q24H  . guaiFENesin  1,200 mg Oral BID  . levalbuterol  0.63 mg Nebulization TID  . levofloxacin (LEVAQUIN) IV  750 mg Intravenous Q24H  . metronidazole  500 mg Intravenous Q8H  . pantoprazole  40 mg Oral Daily  . [START ON 10/03/2014] predniSONE  10 mg Oral QAC breakfast  . vancomycin  750 mg Intravenous Q8H   Continuous Infusions: . sodium chloride 125 mL/hr at 09/25/14 6962    Principal Problem:   PNA (pneumonia) Active Problems:   Abdominal pain   Crohn's regional enteritis   Gastroesophageal reflux disease without esophagitis   Pleural effusion   Nausea with vomiting   Leukocytosis   Sinus tachycardia   Dehydration   Colitis:???    Time spent: 25 min    [facility] Pager [phone] . If 7PM-7AM, please contact night-coverage at [email or web address],  password [facility] 09/25/2014, 12:31 PM  LOS: 1 day

## 2014-09-25 NOTE — Progress Notes (Addendum)
Patient has been reporting pain since thoracentesis this morning.  Have administered pain medication but patient still not comfortable.  Spoke with Laurey Arrow, NP from PCCM on floor and Dr. Darrick Meigs.  Received new orders.  Will administer new pain medication.   Edited to state that patient has been tachycardic in 130s/140s with HR up to 160s with activity today.  Pt has low-grade temp and seemed anxious, RR 22. Notified Dr. Darrick Meigs and administered tylenol and xanax to patient.  HR has come down to 120s at this time.  Will continue to monitor.

## 2014-09-25 NOTE — Consult Note (Signed)
Referring Provider: [facility] Primary Care Physician:  No primary provider on file. Primary Gastroenterologist:  Dr. Hilarie Fredrickson  Reason for Consultation:  Fever, nausea, diarrhea, hx Crohns    HPI: Meghan Charles is a 43 y.o. female with a past medical history of anxiety, adenomatous colon polyp, gallbladder polyp, and recurrent episodic abdominal pain and recent diagnosis of Crohn's enteritis She was seen by Dr Jamison Oka.initially on 05/08/2014 to evaluate episodic midabdominal pain associated with nausea and vomiting. Symptoms were felt to be related to partial obstruction and Crohn's enteritis was hypothesized. She had a colonoscopy on 05/26/2014 which revealed a normal examined terminal ileum, and 2 adenomatous colon polyps the greatest was 10 mm in size, and left-sided diverticulosis. Subsequently a capsule endoscopy showed mid and distal small bowel ulcers and erosions consistent with Crohn's disease, capsule did not reach the cecum by the end of the study. KUB confirmed the capsule did pass. Ileal budesonide was prescribed but cost prohibitive. She was started on prednisone with plans to induce  Remicade.She has been able to taper prednisone down to 5 mg, but had recurrent symptoms and increased back to 10 mg daily.She had one dose of remicade on 09/03/14 and was due for her second dose 3 days ago, but she did not receive it due to insurance problems.      She presented top the ER yesterday reporting that 5 days prior to admission she had a fever of 102, felt okay for the next 2 days, and then again developed a fever to 101.7 along with nausea and vomiting. She was seen in the ER 09/22/14 with hoarseness, a nonproductive cough and RLQ abdominal pain. CT of the abdomen was obtained and showed thickening of the ileum without obvious abscess. Also showed a small left pleural effusion. She was given a zpack .She states that she continued to have reoccurrence of her N/V, 3 episodes yesterday,, nonbloody  nonbilious with only stomach contents. States she can't keep fluids or food down. Reports that her RLQ abd pain is about baseline--just has been sore for a few weeks.        Past Medical History  Diagnosis Date  . Gallbladder polyp   . Anxiety   . Colon polyps 05/2014    TUBULAR ADENOMA AND HYPERPLASTIC POLYP.  . Crohn's disease     Past Surgical History  Procedure Laterality Date  . Other surgical history      heel surgery  . Wisdom teeth extracted    . Right heel surgery      with plates and screws  . Colonoscopy w/ biopsies  2015    Prior to Admission medications   Medication Sig Start Date End Date Taking? Authorizing Provider  azithromycin (ZITHROMAX) 250 MG tablet Take 1 tablet (250 mg total) by mouth daily. Take first 2 tablets together, then 1 every day until finished. 09/22/14  Yes Merryl Hacker, MD  calcium carbonate 1250 MG capsule Take 1,250 mg by mouth daily.   Yes Historical Provider, MD  Cholecalciferol (VITAMIN D) 1000 UNITS capsule Take 1,000 Units by mouth daily.   Yes Historical Provider, MD  ondansetron (ZOFRAN) 4 MG tablet Take 1 tablet (4 mg total) by mouth every 6 (six) hours as needed for nausea or vomiting. 07/22/14  Yes Jerene Bears, MD  OVER THE COUNTER MEDICATION Place 2 drops into both eyes daily as needed (dry eyes). [employer] brand eye drops for red/itchy eyes.   Yes Historical Provider, MD  predniSONE (DELTASONE) 5 MG tablet  Take 5 mg by mouth daily with breakfast.   Yes Historical Provider, MD  traMADol (ULTRAM) 50 MG tablet Take 1-2 tablets by mouth every 6 hours as needed for pain 08/18/14  Yes Lafayette Dragon, MD    Current Facility-Administered Medications  Medication Dose Route Frequency Provider Last Rate Last Dose  . 0.9 %  sodium chloride infusion   Intravenous Continuous Eugenie Filler, MD 125 mL/hr at 09/25/14 0615    . acetaminophen (TYLENOL) tablet 650 mg  650 mg Oral Q4H PRN Eugenie Filler, MD   650 mg at 09/25/14 [phone]  . calcium  carbonate (OS-CAL - dosed in mg of elemental calcium) tablet 1,250 mg  1,250 mg Oral Daily Eugenie Filler, MD      . cholecalciferol (VITAMIN D) tablet 1,000 Units  1,000 Units Oral Daily Irine Seal V, MD      . enoxaparin (LOVENOX) injection 40 mg  40 mg Subcutaneous Q24H Eugenie Filler, MD   40 mg at 09/24/14 2216  . guaiFENesin (MUCINEX) 12 hr tablet 1,200 mg  1,200 mg Oral BID Eugenie Filler, MD   1,200 mg at 09/24/14 2216  . levalbuterol (XOPENEX) nebulizer solution 0.63 mg  0.63 mg Nebulization Q2H PRN Eugenie Filler, MD      . levalbuterol [facility]) nebulizer solution 0.63 mg  0.63 mg Nebulization TID Eugenie Filler, MD   0.63 mg at 09/25/14 1191  . levofloxacin (LEVAQUIN) IVPB 750 mg  750 mg Intravenous Q24H Posey Rea, RPH 100 mL/hr at 09/24/14 1745 750 mg at 09/24/14 1745  . metroNIDAZOLE (FLAGYL) IVPB 500 mg  500 mg Intravenous Q8H Eugenie Filler, MD   500 mg at 09/25/14 0515  . morphine (MSIR) tablet 15 mg  15 mg Oral Q4H PRN Irine Seal V, MD      . morphine 2 MG/ML injection 2-4 mg  2-4 mg Intravenous Q4H PRN Eugenie Filler, MD      . ondansetron [facility]) injection 4 mg  4 mg Intravenous Q6H PRN Eugenie Filler, MD   4 mg at 09/25/14 0446  . pantoprazole (PROTONIX) EC tablet 40 mg  40 mg Oral Daily Eugenie Filler, MD      . Derrill Memo ON 10/03/2014] predniSONE (DELTASONE) tablet 10 mg  10 mg Oral QAC breakfast Eugenie Filler, MD      . traMADol Veatrice Bourbon) tablet 50 mg  50 mg Oral Q6H PRN Eugenie Filler, MD      . vancomycin (VANCOCIN) IVPB 750 mg/150 ml premix  750 mg Intravenous Q8H Eugenie Filler, MD   750 mg at 09/25/14 0348    Allergies as of 09/24/2014  . (No Known Allergies)    Family History  Problem Relation Age of Onset  . Colon cancer Maternal Grandmother 49  . Esophageal cancer Neg Hx   . Stomach cancer Neg Hx   . Rectal cancer Neg Hx   . Pancreatic cancer Neg Hx     History   Social History  . Marital Status:  Single    Spouse Name: N/A    Number of Children: N/A  . Years of Education: N/A   Occupational History  . Recruitment consultant    Social History Main Topics  . Smoking status: Never Smoker   . Smokeless tobacco: Never Used  . Alcohol Use: Yes     Comment: 3 times per week  . Drug Use: No  . Sexual Activity: No   Other Topics  Concern  . Not on file   Social History Narrative  . No narrative on file    Review of Systems: Gen: Denies any chills, sweats, anorexia, fatigue, weakness, malaise, weight loss, and sleep disorder. Has had fever. CV: Denies chest pain, angina, palpitations, syncope, orthopnea, PND, peripheral edema, and claudication. Resp: Denies dyspnea at rest, dyspnea with exercise,  sputum, wheezing, coughing up blood, and pleurisy.Has prod cough GI: Denies vomiting blood, jaundice, and fecal incontinence.   Denies dysphagia or odynophagia. GU : Denies urinary burning, blood in urine, urinary frequency, urinary hesitancy, nocturnal urination, and urinary incontinence. MS: Denies joint pain, limitation of movement, and swelling, stiffness, low back pain, extremity pain. Denies muscle weakness, cramps, atrophy.  Derm: Denies rash, itching, dry skin, hives, moles, warts, or unhealing ulcers.  Psych: Denies depression, anxiety, memory loss, suicidal ideation, hallucinations, paranoia, and confusion. Heme: Denies bruising, bleeding, and enlarged lymph nodes. Neuro:  Denies any headaches, dizziness, paresthesias. Endo:  Denies any problems with DM, thyroid, adrenal function.  Physical Exam: Vital signs in last 24 hours: Temp:  [98 F (36.7 C)-100.3 F (37.9 C)] 100.3 F (37.9 C) (10/09 0419) Pulse Rate:  [109-132] 123 (10/09 0419) Resp:  [16-20] 16 (10/09 0419) BP: (126-147)/(77-100) 126/84 mmHg (10/09 0419) SpO2:  [92 %-100 %] 97 % (10/09 6644) Weight:  [147 lb (66.679 kg)-149 lb 0.5 oz (67.6 kg)] 149 lb 0.5 oz (67.6 kg) (10/08 1807) Last BM Date: 09/24/14 General:    Alert,  Well-developed, well-nourished, pleasant and cooperative in NAD Head:  Normocephalic and atraumatic. Eyes:  Sclera clear, no icterus.   Conjunctiva pink. Ears:  Normal auditory acuity. Nose:  No deformity, discharge,  or lesions. Mouth:  No deformity or lesions.   Neck:  Supple; no masses or thyromegaly. Lungs:  Decreased breath sounds left luing base Heart:  Regular rate and rhythm; no murmurs, clicks, rubs,  or gallops. Abdomen:  Soft,mild right lower quadrant tenderness, BS active,nonpalp mass or hsm.   Rectal:  Deferred  Msk:  Symmetrical without gross deformities. . Pulses:  Normal pulses noted. Extremities:  Without clubbing or edema. Neurologic:  Alert and  oriented x4;  grossly normal neurologically. Skin:  Intact without significant lesions or rashes.. Psych:  Alert and cooperative. Normal mood and affect.  Intake/Output from previous day: 10/08 0701 - 10/09 0700 In: 1500 [I.V.:950; IV Piggyback:550] Out: -  Intake/Output this shift:    Lab Results:  Recent Labs  09/22/14 1021 09/24/14 1126 09/25/14 0440  WBC 9.0 12.7* 9.7  HGB 12.7 13.6 11.4*  HCT 37.8 39.8 34.5*  PLT 328 360 343   BMET  Recent Labs  09/22/14 1021 09/24/14 1126 09/25/14 0440  NA 136* 137 132*  K 4.7 3.9 3.6*  CL 99 98 98  CO2 22 24 20   GLUCOSE 111* 111* 138*  BUN 15 18 6   CREATININE 0.62 0.70 0.61  CALCIUM 9.1 9.3 8.2*   LFT  Recent Labs  09/24/14 1126  PROT 8.3  ALBUMIN 3.5  AST 23  ALT 25  ALKPHOS 65  BILITOT 0.4   PT/INR  Recent Labs  09/25/14 0440  LABPROT 14.4  INR 1.10      Studies/Results: Ct Angio Chest Pe W/cm &/or Wo Cm  09/24/2014   CLINICAL DATA:  Persistent tachycardia recent diagnosis of pleural effusion. Evaluate for pulmonary embolism. History of Crohn disease.  EXAM: CT ANGIOGRAPHY CHEST WITH CONTRAST  TECHNIQUE: Multidetector CT imaging of the chest was performed using the standard protocol during bolus administration  of intravenous  contrast. Multiplanar CT image reconstructions and MIPs were obtained to evaluate the vascular anatomy.  CONTRAST:  114mL OMNIPAQUE IOHEXOL 350 MG/ML SOLN  COMPARISON:  CT abdomen and pelvis 09/22/2014  FINDINGS: This is a satisfactory evaluation of the pulmonary arterial tree. No focal filling defects are identified in the pulmonary arteries.  There is a reactive-size prevascular mediastinal lymph node that measures 9.5 mm. Thoracic aorta is normal in caliber and enhancement. Negative for aortic dissection. Heart size is normal.  There is a moderate sized simple left pleural effusion. Negative for right pleural effusion or pericardial effusion. Esophagus is unremarkable.  There is stranding in the mediastinal fat to the left of the heart, at the cardiophrenic angle.  Lung windows demonstrate extensive multifocal pulmonary parenchymal airspace opacities, with areas of consolidation. Airspace disease is seen with the upper lobes and both lower lobes. No definite right middle lobe airspace disease. There is architectural distortion and scarring in both upper lobes, this appears similar to chest radiograph of November 2013. Definite the etiology for prior scarring is uncertain.  There are bilateral subpectoral breast implants. Soft tissues of both breasts are symmetric. Negative for normal axillary lymphadenopathy.  No acute findings are seen in the imaged portion of the upper abdomen.  No acute osseous abnormality.  Review of the MIP images confirms the above findings.  IMPRESSION: 1. Bilateral multifocal, multilobar airspace disease is most consistent with pneumonia. Reactive sized pre-vascular lymph node. 2. Moderate-sized simple left pleural effusion. 3. Stranding in the epicardial fat pad. This finding is nonspecific, but has been reported in the setting of mediastinal fat necrosis, which can be reported in patients with pneumonia. 4. Architectural distortion/scarring in the upper lobes bilaterally. This  appearance was present on a chest radiograph of 2013. The cause for prior parenchymal pulmonary scarring is unknown. 5. Negative for pulmonary embolism.   Electronically Signed   By: Curlene Dolphin M.D.   On: 09/24/2014 16:46   Dg Chest Port 1 View  09/24/2014   CLINICAL DATA:  Evaluate pleural effusion.  Cough.  EXAM: PORTABLE CHEST - 1 VIEW  COMPARISON:  CT chest 09/24/2014.  Chest radiograph 11/04/2012  FINDINGS: There is a moderate size left pleural effusion. Patchy multifocal bilateral airspace disease in the upper lung fields is noted. There is atelectasis at the left lung base. The heart and mediastinal contours appear stable compared to prior chest radiograph.  Pulmonary scarring/architectural distortion is noted in the upper lung fields, and chronic finding.  IMPRESSION: Bilateral airspace disease is most consistent with bilateral multifocal pneumonia.  Moderate size left pleural effusion, likely parapneumonic.   Electronically Signed   By: Curlene Dolphin M.D.   On: 09/24/2014 21:41   CT Abdomen Pelvis W Contrast Status: Final result    Study Result    CLINICAL DATA: Fever, chills, nausea. Crohn's disease.  EXAM:  CT ABDOMEN AND PELVIS WITH CONTRAST  TECHNIQUE:  Multidetector CT imaging of the abdomen and pelvis was performed  using the standard protocol following bolus administration of  intravenous contrast.  CONTRAST: 135mL OMNIPAQUE IOHEXOL 300 MG/ML SOLN  COMPARISON: CT abdomen 11/04/2012  FINDINGS:  Small to moderate left pleural effusion with mild left lower lobe  atelectasis.  Liver gallbladder and bile ducts are normal. Pancreas and spleen are  normal. Right adrenal lesion measures 18 x 28 mm and is stable from  prior studies consistent with adenoma. This has diffusely low  attenuation compared to the liver.  Thickened ileum is present in the  pelvis. This is approximately 10  cm proximal to the ileal cecal valve. The terminal ileum is not  involved. There is a 15 cm  segment of thickened ileum present in the  pelvis which has progressed since 2013. This is most consistent with  diagnosis of Crohn's disease. This is not causing bowel obstruction.  Colon not involved. Appendix normal.  Negative for abscess. No free fluid or adenopathy.  IMPRESSION:  Right adrenal adenoma stable  Thickened loop of ileum in the pelvis consist with Crohn's disease  not causing obstruction. No abscess or free fluid. Progressive  thickening of the ileum since 2013. The terminal ileum appears  spared.  Electronically Signed  By: Franchot Gallo M.D.  On: 09/22/2014 13:12     IMPRESSION/PLAN:  1. Crohn's enteritis. Pt with nausea and vomiting for several days, and a little diarrhea. Pt says her RLQ discomfort is close to baseline. Currently on prednisone 10 mg po and IV flagyl. CT with thickening at ileum with no abscess. WBC normal. Has received one dose Remicade in September, but due to recent findings of bilat pneumonia and pleural effusion, would hold further dosing. Clear liquids. IV hydration. Continue flagyl. (Pt currrently on levaquin and vancomycin as well). C diff pending. Will add florastor. Will review with Dr Fuller Plan re: further recommendations.  2. Left pleural effusion. Pt had thoracentesis with 1 liter fluid drawn off this morning.  3. Bilat multifocal pneumonia. Pt has been on chronic steroid therapy as well as one dose of Remicade. On IV levaquin, IV vanc and mucinex. Cultures/gram stain pending.  4. Dehydration. Pt getting IV fluids.   Hvozdovic, Deloris Ping 09/25/2014,  Pager [phone]      Attending physician's note   I have taken a history, examined the patient and reviewed the chart. I agree with the Advanced Practitioner's note, impression and recommendations.  Crohn's enteritis on chronic Prednisone and had one dose of Remicade in mid September.  Nausea and vomiting likely related to acute illness and/or corticosteroid induced adrenal  insufficiency, not Crohn's.  Mild diarrhea and mild RLQ pain likely related to Crohn's. R/O C diff.  Consider IV corticosteroids for suspected adrenal insufficieny with acute illness and then continue Prednisone 10 mg daily.  Flagyl IV may help with Crohn's enteritis-would to change to PO when she improves.  Remicade cannot be used until PNA has completely resolved and this will need further review by Dr. Hilarie Fredrickson as an outpatient. Please call for questions.   Ladene Artist, MD [facility]

## 2014-09-25 NOTE — ED Provider Notes (Signed)
Medical screening examination/treatment/procedure(s) were conducted as a shared visit with non-physician practitioner(s) and myself.  I personally evaluated the patient during the encounter.   EKG Interpretation   Date/Time:  Thursday September 24 2014 17:20:42 EDT Ventricular Rate:  126 PR Interval:  135 QRS Duration: 58 QT Interval:  312 QTC Calculation: 452 R Axis:   136 Text Interpretation:  Right and left arm electrode reversal,  interpretation assumes no reversal Sinus tachycardia Non-specific ST-t  changes No old tracing to compare Confirmed by [place]  MD, [place] [phone])  on 09/24/2014 5:24:59 PM      Patient here with persistent vomiting, seen 2 days ago had a new pleural effusion.  On Azithromycin for this new effusion. Patient persistently tachycardic, unable to get her N/V under control. Will scan for possible PE. CT chest shows multilobar pneumonia. Admitted.  Evelina Bucy, MD 09/25/14 [phone]

## 2014-09-25 NOTE — Progress Notes (Signed)
INITIAL NUTRITION ASSESSMENT  DOCUMENTATION CODES Per approved criteria  -Not Applicable   INTERVENTION:  Provide Resource Breeze po TID, each supplement provides 250 kcal and 9 grams of protein  Encourage Po intake  RD to continue to monitor  NUTRITION DIAGNOSIS: Inadequate oral intake related to N/V, abdominal pain as evidenced by Poor PO intake of 0-25%.   Goal: Pt to meet >/= 90% of their estimated nutrition needs   Monitor:  PO and supplemental intake, weight, labs, I/O's   Reason for Assessment: Pt identified as at nutrition risk on the Malnutrition Screen Tool  Admitting Dx: PNA (pneumonia)  ASSESSMENT: 43 y.o. F with PMH as outlined below and who presented to the [facility] ED on 10/8 with fever, nausea, vomiting x 5 days. CTA of the chest was obtained and revealed bilateral multifocal PNA as well as a moderate sized left pleural effusion.  10/9 Thoracentesis produced 1 L of fluid.  PO intake: 0-25%, pt was only able to eat a popsicle today and states that she will attempt to eat jello tonight. Pt with Crohn's disease, pt is very familiar with diet and tries to follow at home even though she admits to eating what she wants occasionally. Reviewed the basics of the low residue diet with pt and family. Pt reports weight loss but could not specify how much d/t fluid accumulation.  Pt has had decreased appetite PTA d/t abdominal pain, N/V and chronic pain from Crohn's disease.  Pt would like to receive [employer] with all meals.  Nutrition focused physical exam shows no sign of depletion of muscle mass or body fat.  Labs reviewed: Low Na & K Glucose 138  Height: Ht Readings from Last 1 Encounters:  09/24/14 5\' 8"  (1.727 m)    Weight: Wt Readings from Last 1 Encounters:  09/24/14 149 lb 0.5 oz (67.6 kg)    Ideal Body Weight: 140 lb   % Ideal Body Weight: 106%  Wt Readings from Last 10 Encounters:  09/24/14 149 lb 0.5 oz (67.6 kg)  09/03/14 147 lb 12.8 oz  (67.042 kg)  09/01/14 148 lb (67.132 kg)  06/29/14 146 lb (66.225 kg)  05/26/14 146 lb (66.225 kg)  05/22/14 146 lb (66.225 kg)  05/08/14 146 lb (66.225 kg)  03/06/14 145 lb (65.772 kg)  11/05/12 140 lb (63.504 kg)    Usual Body Weight: 140 lb  % Usual Body Weight: 106%  BMI:  Body mass index is 22.67 kg/(m^2).  Estimated Nutritional Needs: Kcal: 1700-1900 Protein: 85-95g Fluid: 1.7L/day  Skin: intact  Diet Order: Clear Liquid  EDUCATION NEEDS: -Education needs addressed   Intake/Output Summary (Last 24 hours) at 09/25/14 0859 Last data filed at 09/25/14 0615  Gross per 24 hour  Intake   1500 ml  Output      0 ml  Net   1500 ml    Last BM: 10/8  Labs:   Recent Labs Lab 09/22/14 1021 09/24/14 1126 09/24/14 1844 09/25/14 0440  NA 136* 137  --  132*  K 4.7 3.9  --  3.6*  CL 99 98  --  98  CO2 22 24  --  20  BUN 15 18  --  6  CREATININE 0.62 0.70  --  0.61  CALCIUM 9.1 9.3  --  8.2*  MG  --   --  2.0  --   PHOS  --   --  3.8  --   GLUCOSE 111* 111*  --  138*  CBG (last 3)  No results found for this basename: GLUCAP,  in the last 72 hours  Scheduled Meds: . calcium carbonate  1,250 mg Oral Daily  . cholecalciferol  1,000 Units Oral Daily  . enoxaparin (LOVENOX) injection  40 mg Subcutaneous Q24H  . guaiFENesin  1,200 mg Oral BID  . levalbuterol  0.63 mg Nebulization TID  . levofloxacin (LEVAQUIN) IV  750 mg Intravenous Q24H  . metronidazole  500 mg Intravenous Q8H  . pantoprazole  40 mg Oral Daily  . [START ON 10/03/2014] predniSONE  10 mg Oral QAC breakfast  . vancomycin  750 mg Intravenous Q8H    Continuous Infusions: . sodium chloride 125 mL/hr at 09/25/14 7353    Past Medical History  Diagnosis Date  . Gallbladder polyp   . Anxiety   . Colon polyps 05/2014    TUBULAR ADENOMA AND HYPERPLASTIC POLYP.  . Crohn's disease     Past Surgical History  Procedure Laterality Date  . Other surgical history      heel surgery  . Wisdom  teeth extracted    . Right heel surgery      with plates and screws  . Colonoscopy w/ biopsies  2015    Clayton Bibles, MS, RD, [place] Licensed Dietitian Nutritionist Pager: [phone]

## 2014-09-25 NOTE — Telephone Encounter (Signed)
I have contacted [employer] to see if a determination has been made regarding coverage of Remicade. A determination has not yet been made. He suggest we call back in 72 hours to see if a determination has been made at that time.

## 2014-09-25 NOTE — Procedures (Signed)
Successful US guided left thoracentesis. Yielded 1 liter of clear yellow fluid. Pt tolerated procedure well. No immediate complications.  Specimen was sent for labs. CXR ordered.  Tsosie Billing D PA-C 09/25/2014 9:19 AM

## 2014-09-25 NOTE — Progress Notes (Addendum)
Name: Meghan Charles MRN: [id number] DOB: July 03, 1971    ADMISSION DATE:  09/24/2014 CONSULTATION DATE:  09/24/14  REFERRING MD :  Meghan Charles  CHIEF COMPLAINT:  Fever, nausea, vomiting, diarrhea  BRIEF PATIENT DESCRIPTION: 43 y.o. F brought to ED with fever, nausea, vomiting x 5 days.  Found to have bilateral PNA and left pleural effusion.  PCCM consulted for recs.  SIGNIFICANT EVENTS  10/6 - presented to ED and discharged with Zpack. 10/8 - returns to ED with same symptoms, admitted with PNA and left pleural effusion.  STUDIES:  CT abd/pelvis 10/6 >>> right adrenal adenoma is stable, thickened loop of ileum in the pelvis c/w Crohn's, not causing obstruction.  Progressive thickening of ileum since 2013. CTA Chest 10/8 >>> b/l multifocal PNA, reactive lymph nod, moderate sized simple left pleural effusion.  Negative for PE.   HISTORY OF PRESENT ILLNESS:  Ms. Meghan Charles is a 43 y.o. F with PMH as outlined below and who presented to the [facility] ED on 10/8 with fever, nausea, vomiting x 5 days.  TMax 5 days ago was reportedly 102.  She was actually seen in our ED 2 days ago for the same.  CT of abdo/pelvis was done at the time which showed thickening around colon as well as a left pleural effusion.  She was discharged with a Z-pack which did not provide much relief. On day of presentation, she continued to have above symptoms along with diarrhea and RLQ pain (unchanged from her chronic abdominal pain secondary to Crohn's).  She denies any chst pain.  Over past 2 days has developed mild cough, non-productive as well as some exertional dyspnea. While in ED, she was noted to have temp of 100.1 and HR or 132.  CBC was remarkable for WBC of 12.7 with left shift. CTA of the chest was obtained and revealed bilateral multifocal PNA as well as a moderate sized left pleural effusion. PCCM was consulted for recs.  PAST MEDICAL HISTORY :   has a past medical history of Gallbladder polyp; Anxiety; Colon polyps  (05/2014); and Crohn's disease.  has past surgical history that includes Other surgical history; wisdom teeth extracted; Right Heel surgery; and Colonoscopy w/ biopsies (2015). Prior to Admission medications   Medication Sig Start Date End Date Taking? Authorizing Provider  azithromycin (ZITHROMAX) 250 MG tablet Take 1 tablet (250 mg total) by mouth daily. Take first 2 tablets together, then 1 every day until finished. 09/22/14  Yes Merryl Hacker, MD  calcium carbonate 1250 MG capsule Take 1,250 mg by mouth daily.   Yes Historical Provider, MD  Cholecalciferol (VITAMIN D) 1000 UNITS capsule Take 1,000 Units by mouth daily.   Yes Historical Provider, MD  ondansetron (ZOFRAN) 4 MG tablet Take 1 tablet (4 mg total) by mouth every 6 (six) hours as needed for nausea or vomiting. 07/22/14  Yes Jerene Bears, MD  OVER THE COUNTER MEDICATION Place 2 drops into both eyes daily as needed (dry eyes). [employer] brand eye drops for red/itchy eyes.   Yes Historical Provider, MD  predniSONE (DELTASONE) 5 MG tablet Take 5 mg by mouth daily with breakfast.   Yes Historical Provider, MD  traMADol (ULTRAM) 50 MG tablet Take 1-2 tablets by mouth every 6 hours as needed for pain 08/18/14  Yes Lafayette Dragon, MD   No Known Allergies  FAMILY HISTORY:  family history includes Colon cancer (age of onset: 32) in her maternal grandmother. There is no history of Esophageal cancer, Stomach cancer, Rectal cancer,  or Pancreatic cancer. SOCIAL HISTORY:  reports that she has never smoked. She has never used smokeless tobacco. She reports that she drinks alcohol. She reports that she does not use illicit drugs.  REVIEW OF SYSTEMS:   All negative; except for those that are bolded, which indicate positives.  Constitutional: weight loss, weight gain, night sweats, fevers, chills, fatigue, weakness.  HEENT: headaches, sore throat, sneezing, nasal congestion, post nasal drip, difficulty swallowing, tooth/dental problems, visual  complaints, visual changes, ear aches. Neuro: difficulty with speech, weakness, numbness, ataxia. CV:  chest pain, orthopnea, PND, swelling in lower extremities, dizziness, palpitations, syncope.  Resp: cough, hemoptysis, dyspnea, wheezing. GI  heartburn, indigestion, abdominal pain, nausea, vomiting, diarrhea, constipation, change in bowel habits, loss of appetite, hematemesis, melena, hematochezia.  GU: dysuria, change in color of urine, urgency or frequency, flank pain, hematuria. MSK: joint pain or swelling, decreased range of motion. Psych: change in mood or affect, depression, anxiety, suicidal ideations, homicidal ideations. Skin: rash, itching, bruising.   SUBJECTIVE:   VITAL SIGNS: Temp:  [98 F (36.7 C)-100.3 F (37.9 C)] 98.5 F (36.9 C) (10/09 0837) Pulse Rate:  [109-131] 123 (10/09 0419) Resp:  [16-20] 16 (10/09 0419) BP: (111-147)/(77-98) 111/79 mmHg (10/09 0916) SpO2:  [92 %-100 %] 96 % (10/09 0916) Weight:  [66.679 kg (147 lb)-67.6 kg (149 lb 0.5 oz)] 67.6 kg (149 lb 0.5 oz) (10/08 1807)  PHYSICAL EXAMINATION: General: resting in bed, mild pain HEENT: NCAT PULM: diminished left base, otherwise clear CV: RRR, no mgr AB: BS+, soft nontender Ext: warm no edema Neuro: A&Ox4, maew    Recent Labs Lab 09/22/14 1021 09/24/14 1126 09/25/14 0440  NA 136* 137 132*  K 4.7 3.9 3.6*  CL 99 98 98  CO2 22 24 20   BUN 15 18 6   CREATININE 0.62 0.70 0.61  GLUCOSE 111* 111* 138*    Recent Labs Lab 09/22/14 1021 09/24/14 1126 09/25/14 0440  HGB 12.7 13.6 11.4*  HCT 37.8 39.8 34.5*  WBC 9.0 12.7* 9.7  PLT 328 360 343   Dg Chest 1 View  09/25/2014   CLINICAL DATA:  Initial encounter for left-sided chest pain after left thoracentesis.  EXAM: CHEST - 1 VIEW  COMPARISON:  CT chest from 09/22/2014  FINDINGS: No evidence for pneumothorax. Small left pleural effusion noted. Asymmetric patchy airspace disease has a central predominance, similar to what was seen on the  previous CT scan. Imaged bony structures of the thorax are intact. Telemetry leads overlie the chest.  IMPRESSION: No evidence for pneumothorax status post thoracentesis.   Electronically Signed   By: Misty Stanley M.D.   On: 09/25/2014 09:41   Ct Angio Chest Pe W/cm &/or Wo Cm  09/24/2014   CLINICAL DATA:  Persistent tachycardia recent diagnosis of pleural effusion. Evaluate for pulmonary embolism. History of Crohn disease.  EXAM: CT ANGIOGRAPHY CHEST WITH CONTRAST  TECHNIQUE: Multidetector CT imaging of the chest was performed using the standard protocol during bolus administration of intravenous contrast. Multiplanar CT image reconstructions and MIPs were obtained to evaluate the vascular anatomy.  CONTRAST:  166m OMNIPAQUE IOHEXOL 350 MG/ML SOLN  COMPARISON:  CT abdomen and pelvis 09/22/2014  FINDINGS: This is a satisfactory evaluation of the pulmonary arterial tree. No focal filling defects are identified in the pulmonary arteries.  There is a reactive-size prevascular mediastinal lymph node that measures 9.5 mm. Thoracic aorta is normal in caliber and enhancement. Negative for aortic dissection. Heart size is normal.  There is a moderate sized simple left  pleural effusion. Negative for right pleural effusion or pericardial effusion. Esophagus is unremarkable.  There is stranding in the mediastinal fat to the left of the heart, at the cardiophrenic angle.  Lung windows demonstrate extensive multifocal pulmonary parenchymal airspace opacities, with areas of consolidation. Airspace disease is seen with the upper lobes and both lower lobes. No definite right middle lobe airspace disease. There is architectural distortion and scarring in both upper lobes, this appears similar to chest radiograph of November 2013. Definite the etiology for prior scarring is uncertain.  There are bilateral subpectoral breast implants. Soft tissues of both breasts are symmetric. Negative for normal axillary lymphadenopathy.  No  acute findings are seen in the imaged portion of the upper abdomen.  No acute osseous abnormality.  Review of the MIP images confirms the above findings.  IMPRESSION: 1. Bilateral multifocal, multilobar airspace disease is most consistent with pneumonia. Reactive sized pre-vascular lymph node. 2. Moderate-sized simple left pleural effusion. 3. Stranding in the epicardial fat pad. This finding is nonspecific, but has been reported in the setting of mediastinal fat necrosis, which can be reported in patients with pneumonia. 4. Architectural distortion/scarring in the upper lobes bilaterally. This appearance was present on a chest radiograph of 2013. The cause for prior parenchymal pulmonary scarring is unknown. 5. Negative for pulmonary embolism.   Electronically Signed   By: Curlene Dolphin M.D.   On: 09/24/2014 16:46   Dg Chest Port 1 View  09/24/2014   CLINICAL DATA:  Evaluate pleural effusion.  Cough.  EXAM: PORTABLE CHEST - 1 VIEW  COMPARISON:  CT chest 09/24/2014.  Chest radiograph 11/04/2012  FINDINGS: There is a moderate size left pleural effusion. Patchy multifocal bilateral airspace disease in the upper lung fields is noted. There is atelectasis at the left lung base. The heart and mediastinal contours appear stable compared to prior chest radiograph.  Pulmonary scarring/architectural distortion is noted in the upper lung fields, and chronic finding.  IMPRESSION: Bilateral airspace disease is most consistent with bilateral multifocal pneumonia.  Moderate size left pleural effusion, likely parapneumonic.   Electronically Signed   By: Curlene Dolphin M.D.   On: 09/24/2014 21:41   Pleural effusion labs and 10/9 CXR reviewed  ASSESSMENT / PLAN:  CAP Left parapneumonic effusion> not complicated based on normal glucose and no septations on CT At risk relative AI - pt on chronic steroid therapy and Remicade (PPD negative) for Crohn's. Acute hypoxemic respiratory failure from pneumonia Recs: Supplemental  O2 as needed to maintain SpO2 > 92%. Continue ceftriaxone and levaquin> could narrow to levaquin alone as she improves, plan 14 day course given effusion, immunosuppression No further pleural fluid drainage needed at this point unless culture becomes positive (don't expect that) Out of bed Flutter valve Incentive spirometry Continue prednisone as written  Roselie Awkward, MD [facility] Pager: [phone] Cell: [phone] If no response, call [phone]

## 2014-09-26 DIAGNOSIS — Z9889 Other specified postprocedural states: Secondary | ICD-10-CM

## 2014-09-26 DIAGNOSIS — K219 Gastro-esophageal reflux disease without esophagitis: Secondary | ICD-10-CM

## 2014-09-26 LAB — LEGIONELLA ANTIGEN, URINE

## 2014-09-26 LAB — BASIC METABOLIC PANEL
ANION GAP: 14 (ref 5–15)
BUN: 3 mg/dL — ABNORMAL LOW (ref 6–23)
CALCIUM: 8.4 mg/dL (ref 8.4–10.5)
CO2: 22 mEq/L (ref 19–32)
Chloride: 97 mEq/L (ref 96–112)
Creatinine, Ser: 0.67 mg/dL (ref 0.50–1.10)
GFR calc non Af Amer: >90 mL/min (ref >90)
Glucose, Bld: 119 mg/dL — ABNORMAL HIGH (ref 70–99)
Potassium: 3.5 mEq/L — ABNORMAL LOW (ref 3.7–5.3)
SODIUM: 133 meq/L — AB (ref 137–147)

## 2014-09-26 LAB — CBC WITH DIFFERENTIAL/PLATELET
BASOS ABS: 0 10*3/uL (ref 0.0–0.1)
BASOS PCT: 0 % (ref 0–1)
EOS PCT: 0 % (ref 0–5)
Eosinophils Absolute: 0 10*3/uL (ref 0.0–0.7)
HEMATOCRIT: 35.6 % — AB (ref 36.0–46.0)
Hemoglobin: 11.9 g/dL — ABNORMAL LOW (ref 12.0–15.0)
Lymphocytes Relative: 14 % (ref 12–46)
Lymphs Abs: 1.2 10*3/uL (ref 0.7–4.0)
MCH: 28.2 pg (ref 26.0–34.0)
MCHC: 33.4 g/dL (ref 30.0–36.0)
MCV: 84.4 fL (ref 78.0–100.0)
MONO ABS: 1.4 10*3/uL — AB (ref 0.1–1.0)
Monocytes Relative: 15 % — ABNORMAL HIGH (ref 3–12)
NEUTROS ABS: 6.5 10*3/uL (ref 1.7–7.7)
Neutrophils Relative %: 71 % (ref 43–77)
PLATELETS: 326 10*3/uL (ref 150–400)
RBC: 4.22 MIL/uL (ref 3.87–5.11)
RDW: 13.6 % (ref 11.5–15.5)
WBC: 9.1 10*3/uL (ref 4.0–10.5)

## 2014-09-26 MED ORDER — METOPROLOL TARTRATE 12.5 MG HALF TABLET
12.5000 mg | ORAL_TABLET | Freq: Two times a day (BID) | ORAL | Status: DC
  Administered 2014-09-26 – 2014-09-28 (×5): 12.5 mg via ORAL
  Filled 2014-09-26 (×6): qty 1

## 2014-09-26 NOTE — Progress Notes (Addendum)
[facility] PROGRESS NOTE  Meghan Charles PYK:[id number] DOB: 1971/02/16 DOA: 09/24/2014 PCP: No primary provider on file.  Assessment/Plan: 1. Community acquired pneumonia, para pneumonic effusion-  Patient was started on on vancomycin, levaquin, supplemental oxygen, flutter valve, s/p thoracentesis. Pulmonary recommends only Levaquin, will discontinue vancomycin at this time. 2. Para pneumonic effusion- s/p thoracentesis, pleural fluid analysis shows  Wbc 3413, hazy appearance, culture is pending. 3. Crohn's colitis- CT scan done on 10/6  shows thickened loops of ileum consistent with Crohn's disease not causing obstruction. Started on IV Flagyl, prednisone 10 mg po daily. 4. GERD- Continue with protonix daily. 5. Sinus tachycardia- likely due to the pneumonia, and also anxiety contributing to it. Xanax 0.5 mg 3 times a day.Will add metoprolol for 12.5 milligrams twice a day  Code Status: *Full code Family Communication: *No family at bedside Disposition Plan: Home when stable   Consultants:  Pulmonary  Procedures:  Thoracentesis  Antibiotics:  Vancomycin 10/8  Levaquin 10/8  HPI/Subjective: 43 y.o. female  With history of Crohn's disease diagnosed in June of 2015 who was started on Remicade and has received one dose on 09/03/2014 on chronic prednisone therapy, anxiety, who presents to the ED with five-day history of intermittent fevers, nausea and emesis.  Patient states that 5 days prior to admission had a temperature of 102 and felt better subsequently after that for the next 2 days. 2 days prior to admission patient stated awoke around 3:15 in the morning noted to have a temperature of 101.7 with some associated nausea nonbilious emesis x3. Patient subsequently presented to the ED which he states was given some pain medications IV fluids and anti-medics. CT scan was done that showed thickening around the colon and are noted left pleural effusion. Patient said at that  time was done to develop a nonproductive cough and was subsequently discharged on a Z-Pak. Patient states 1 day prior to admission had a temperature on a 1.5 at work with some nausea and emesis which was ongoing for 2 hours. The day of admission while at work patient noted she has some nausea and emesis and subsequently presented to the ED. Patient endorses some chills, some associated shortness of breath, nonproductive cough, diarrhea/loose stools with 3-4 episodes one day prior to admission and right lower quadrant abdominal pain she states is unchanged from her chronic abdominal pain secondary to her Crohn's. Patient also endorses some generalized weakness. Patient denies any chest pain, no constipation, no dysuria, no melena, no hematemesis, no hematochezia.  Patient was seen in the emergency room and noted to have a temp of 100.1 with heart rate of 132 and blood pressure of 129/77. Labs obtained had a comprehensive metabolic profile which was unremarkable. CBC obtained had a white count of 12.7 with a left shift. Urine pregnancy test was negative. Urinalysis which was done was cloudy with a specific gravity of greater than 1.046 some ketones some protein nitrite negative leukocytes -0-2 WBCs.  CT angiogram of the chest done showed bilateral multifocal multilobar airspace disease most consistent with pneumonia. Reactive sized prevascular lymph node. Moderate sized simple left pleural effusion. Negative for PE.  We were called to admit the patient for further evaluation and management.   Patient feels better this morning,    Objective: Filed Vitals:   09/26/14 0448  BP: 135/90  Pulse: 132  Temp: 99.8 F (37.7 C)  Resp:     Intake/Output Summary (Last 24 hours) at 09/26/14 1431 Last data filed at 09/26/14 0344  Gross per  24 hour  Intake   1110 ml  Output    400 ml  Net    710 ml   Filed Weights   09/24/14 1725 09/24/14 1807  Weight: 66.679 kg (147 lb) 67.6 kg (149 lb 0.5 oz)     Exam:  Physical Exam: Head: Normocephalic, atraumatic.  Eyes: No signs of jaundice, EOMI Lungs: Normal respiratory effort. Crackles at the left lung base Heart: Regular RR. S1 and S2 normal  Abdomen: BS normoactive. Soft, Nondistended, non-tender.  Extremities: No pretibial edema, no erythema  Data Reviewed: Basic Metabolic Panel:  Recent Labs Lab 09/22/14 1021 09/24/14 1126 09/24/14 1844 09/25/14 0440 09/26/14 0903  NA 136* 137  --  132* 133*  K 4.7 3.9  --  3.6* 3.5*  CL 99 98  --  98 97  CO2 22 24  --  20 22  GLUCOSE 111* 111*  --  138* 119*  BUN 15 18  --  6 3*  CREATININE 0.62 0.70  --  0.61 0.67  CALCIUM 9.1 9.3  --  8.2* 8.4  MG  --   --  2.0  --   --   PHOS  --   --  3.8  --   --    Liver Function Tests:  Recent Labs Lab 09/22/14 1021 09/24/14 1126  AST 13 23  ALT 14 25  ALKPHOS 60 65  BILITOT 0.4 0.4  PROT 7.9 8.3  ALBUMIN 3.5 3.5    Recent Labs Lab 09/22/14 1021  LIPASE 32   No results found for this basename: AMMONIA,  in the last 168 hours CBC:  Recent Labs Lab 09/22/14 1021 09/24/14 1126 09/25/14 0440 09/26/14 0903  WBC 9.0 12.7* 9.7 9.1  NEUTROABS 6.5 10.3* 7.2 6.5  HGB 12.7 13.6 11.4* 11.9*  HCT 37.8 39.8 34.5* 35.6*  MCV 84.6 84.3 84.4 84.4  PLT 328 360 343 326   Cardiac Enzymes: No results found for this basename: CKTOTAL, CKMB, CKMBINDEX, TROPONINI,  in the last 168 hours BNP (last 3 results) No results found for this basename: PROBNP,  in the last 8760 hours CBG: No results found for this basename: GLUCAP,  in the last 168 hours  Recent Results (from the past 240 hour(s))  CULTURE, BLOOD (ROUTINE X 2)     Status: None   Collection Time    09/24/14  4:58 PM      Result Value Ref Range Status   Specimen Description BLOOD LEFT ANTECUBITAL   Final   Special Requests BOTTLES DRAWN AEROBIC AND ANAEROBIC LAC   Final   Culture  Setup Time     Final   Value: 09/24/2014 23:04     Performed at [facility]    Culture     Final   Value:        BLOOD CULTURE RECEIVED NO GROWTH TO DATE CULTURE WILL BE HELD FOR 5 DAYS BEFORE ISSUING A FINAL NEGATIVE REPORT     Performed at [facility]   Report Status PENDING   Incomplete  CULTURE, BLOOD (ROUTINE X 2)     Status: None   Collection Time    09/24/14  5:03 PM      Result Value Ref Range Status   Specimen Description BLOOD RIGHT ANTECUBITAL   Final   Special Requests BOTTLES DRAWN AEROBIC AND ANAEROBIC 5 CC   Final   Culture  Setup Time     Final   Value: 09/24/2014 22:50  Performed at [facility]     Final   Value:        BLOOD CULTURE RECEIVED NO GROWTH TO DATE CULTURE WILL BE HELD FOR 5 DAYS BEFORE ISSUING A FINAL NEGATIVE REPORT     Performed at [facility]   Report Status PENDING   Incomplete  CULTURE, EXPECTORATED SPUTUM-ASSESSMENT     Status: None   Collection Time    09/25/14  6:37 AM      Result Value Ref Range Status   Specimen Description SPUTUM   Final   Special Requests Immunocompromised   Final   Sputum evaluation     Final   Value: MICROSCOPIC FINDINGS SUGGEST THAT THIS SPECIMEN IS NOT REPRESENTATIVE OF LOWER RESPIRATORY SECRETIONS. PLEASE RECOLLECT.     NOTIFIED RN AT 0700 ON 10.9.15 BY [place]   Report Status 09/25/2014 FINAL   Final  CLOSTRIDIUM DIFFICILE BY PCR     Status: None   Collection Time    09/25/14  8:50 AM      Result Value Ref Range Status   C difficile by pcr NEGATIVE  NEGATIVE Final   Comment: Performed at [facility]  BODY FLUID CULTURE     Status: None   Collection Time    09/25/14  9:11 AM      Result Value Ref Range Status   Specimen Description PLEURAL LEFT   Final   Special Requests NONE   Final   Gram Stain     Final   Value: RARE WBC PRESENT, PREDOMINANTLY MONONUCLEAR     NO ORGANISMS SEEN     Performed at [facility]   Culture PENDING   Incomplete   Report Status PENDING   Incomplete     Studies: Dg Chest 1 View  09/25/2014   CLINICAL  DATA:  Initial encounter for left-sided chest pain after left thoracentesis.  EXAM: CHEST - 1 VIEW  COMPARISON:  CT chest from 09/22/2014  FINDINGS: No evidence for pneumothorax. Small left pleural effusion noted. Asymmetric patchy airspace disease has a central predominance, similar to what was seen on the previous CT scan. Imaged bony structures of the thorax are intact. Telemetry leads overlie the chest.  IMPRESSION: No evidence for pneumothorax status post thoracentesis.   Electronically Signed   By: Misty Stanley M.D.   On: 09/25/2014 09:41   Ct Angio Chest Pe W/cm &/or Wo Cm  09/24/2014   CLINICAL DATA:  Persistent tachycardia recent diagnosis of pleural effusion. Evaluate for pulmonary embolism. History of Crohn disease.  EXAM: CT ANGIOGRAPHY CHEST WITH CONTRAST  TECHNIQUE: Multidetector CT imaging of the chest was performed using the standard protocol during bolus administration of intravenous contrast. Multiplanar CT image reconstructions and MIPs were obtained to evaluate the vascular anatomy.  CONTRAST:  138m OMNIPAQUE IOHEXOL 350 MG/ML SOLN  COMPARISON:  CT abdomen and pelvis 09/22/2014  FINDINGS: This is a satisfactory evaluation of the pulmonary arterial tree. No focal filling defects are identified in the pulmonary arteries.  There is a reactive-size prevascular mediastinal lymph node that measures 9.5 mm. Thoracic aorta is normal in caliber and enhancement. Negative for aortic dissection. Heart size is normal.  There is a moderate sized simple left pleural effusion. Negative for right pleural effusion or pericardial effusion. Esophagus is unremarkable.  There is stranding in the mediastinal fat to the left of the heart, at the cardiophrenic angle.  Lung windows demonstrate extensive multifocal pulmonary parenchymal airspace opacities, with areas of consolidation.  Airspace disease is seen with the upper lobes and both lower lobes. No definite right middle lobe airspace disease. There is  architectural distortion and scarring in both upper lobes, this appears similar to chest radiograph of November 2013. Definite the etiology for prior scarring is uncertain.  There are bilateral subpectoral breast implants. Soft tissues of both breasts are symmetric. Negative for normal axillary lymphadenopathy.  No acute findings are seen in the imaged portion of the upper abdomen.  No acute osseous abnormality.  Review of the MIP images confirms the above findings.  IMPRESSION: 1. Bilateral multifocal, multilobar airspace disease is most consistent with pneumonia. Reactive sized pre-vascular lymph node. 2. Moderate-sized simple left pleural effusion. 3. Stranding in the epicardial fat pad. This finding is nonspecific, but has been reported in the setting of mediastinal fat necrosis, which can be reported in patients with pneumonia. 4. Architectural distortion/scarring in the upper lobes bilaterally. This appearance was present on a chest radiograph of 2013. The cause for prior parenchymal pulmonary scarring is unknown. 5. Negative for pulmonary embolism.   Electronically Signed   By: Curlene Dolphin M.D.   On: 09/24/2014 16:46   Dg Chest Port 1 View  09/24/2014   CLINICAL DATA:  Evaluate pleural effusion.  Cough.  EXAM: PORTABLE CHEST - 1 VIEW  COMPARISON:  CT chest 09/24/2014.  Chest radiograph 11/04/2012  FINDINGS: There is a moderate size left pleural effusion. Patchy multifocal bilateral airspace disease in the upper lung fields is noted. There is atelectasis at the left lung base. The heart and mediastinal contours appear stable compared to prior chest radiograph.  Pulmonary scarring/architectural distortion is noted in the upper lung fields, and chronic finding.  IMPRESSION: Bilateral airspace disease is most consistent with bilateral multifocal pneumonia.  Moderate size left pleural effusion, likely parapneumonic.   Electronically Signed   By: Curlene Dolphin M.D.   On: 09/24/2014 21:41   US Thoracentesis  Asp Pleural Space W/img Guide  09/25/2014   INDICATION: Symptomatic left sided pleural effusion  EXAM: US THORACENTESIS ASP PLEURAL SPACE W/IMG GUIDE  COMPARISON:  None.  MEDICATIONS: None  COMPLICATIONS: None immediate  TECHNIQUE: Informed written consent was obtained from the patient after a discussion of the risks, benefits and alternatives to treatment. A timeout was performed prior to the initiation of the procedure.  Initial ultrasound scanning demonstrates a left pleural effusion. The lower chest was prepped and draped in the usual sterile fashion. 1% lidocaine was used for local anesthesia.  Under direct ultrasound guidance, a 19 gauge, 7-cm, Yueh catheter was introduced. An ultrasound image was saved for documentation purposes. The thoracentesis was performed. The catheter was removed and a dressing was applied. The patient tolerated the procedure well without immediate post procedural complication. The patient was escorted to have an upright chest radiograph.  FINDINGS: A total of approximately 1 liters of serous fluid was removed. Requested samples were sent to the laboratory.  IMPRESSION: Successful ultrasound-guided left sided thoracentesis yielding 1 liters of pleural fluid.  Read By:  Tsosie Billing PA-C   Electronically Signed   By: Aletta Edouard M.D.   On: 09/25/2014 10:29    Scheduled Meds: . calcium carbonate  1,250 mg Oral Daily  . cholecalciferol  1,000 Units Oral Daily  . enoxaparin (LOVENOX) injection  40 mg Subcutaneous Q24H  . feeding supplement (RESOURCE BREEZE)  1 Container Oral TID WC  . guaiFENesin  1,200 mg Oral BID  . levalbuterol  0.63 mg Nebulization TID  . levofloxacin (LEVAQUIN) IV  750 mg Intravenous Q24H  . metronidazole  500 mg Intravenous Q8H  . pantoprazole  40 mg Oral Daily  . [START ON 10/03/2014] predniSONE  10 mg Oral QAC breakfast  . zolpidem  5 mg Oral Once   Continuous Infusions: . sodium chloride 100 mL/hr at 09/26/14 1315    Principal Problem:    PNA (pneumonia) Active Problems:   Abdominal pain   Crohn's regional enteritis   Gastroesophageal reflux disease without esophagitis   Pleural effusion   Nausea with vomiting   Leukocytosis   Sinus tachycardia   Dehydration   Colitis:???    Time spent: 25 min    [facility] Pager [phone] . If 7PM-7AM, please contact night-coverage at [email or web address], password [facility] 09/26/2014, 2:31 PM  LOS: 2 days

## 2014-09-26 NOTE — Progress Notes (Signed)
   Name: Meghan Charles MRN: [id number] DOB: [birth year]    ADMISSION DATE:  09/24/2014 CONSULTATION DATE:  09/24/14  REFERRING MD :  Grandville Silos  CHIEF COMPLAINT:  Fever, nausea, vomiting, diarrhea  BRIEF PATIENT DESCRIPTION: 70  yowf never smoker with Crohns dz on Remicade  brought to ED with fever, nausea, vomiting x 5 days.  Found to have bilateral PNA and left pleural effusion.  PCCM consulted for recs.  SIGNIFICANT EVENTS  10/6 - presented to ED and discharged with Zpack. 10/8 - returns to ED with same symptoms, admitted with PNA and left pleural effusion. 10/9 L thoracentesis with 1 liter fluid, nl glucose/ exudative with 3413 wbc 21% Segs  STUDIES:  CT abd/pelvis 10/6 >>> right adrenal adenoma is stable, thickened loop of ileum in the pelvis c/w Crohn's, not causing obstruction.  Progressive thickening of ileum since 2013. CTA Chest 10/8 >>> b/l multifocal PNA, reactive lymph nod, moderate sized simple left pleural effusion.  Negative for PE. - Urine strep 10/9 > neg  - BC x 2 10/9 >>> - Pleural fluid cultures 10/9 > rare wbc, no org seen >>>     SUBJECTIVE:  Less L pleuritic cp, no cough, some chills, no sob on 2lpm NP  VITAL SIGNS: Temp:  [97.7 F (36.5 C)-100.2 F (37.9 C)] 99.8 F (37.7 C) (10/10 0448) Pulse Rate:  [117-140] 132 (10/10 0448) Resp:  [20-22] 20 (10/09 2116) BP: (105-149)/(61-90) 135/90 mmHg (10/10 0448) SpO2:  [93 %-100 %] 93 % (10/10 0448)  PHYSICAL EXAMINATION: General: resting in bed, R side down  HEENT: NCAT PULM: diminished left base, otherwise min rhonchi  CV: RRR, no mgr AB: BS+, soft nontender Ext: warm no edema Neuro: A&Ox4, maew    Recent Labs Lab 09/22/14 1021 09/24/14 1126 09/25/14 0440  NA 136* 137 132*  K 4.7 3.9 3.6*  CL 99 98 98  CO2 22 24 20   BUN 15 18 6   CREATININE 0.62 0.70 0.61  GLUCOSE 111* 111* 138*    Recent Labs Lab 09/22/14 1021 09/24/14 1126 09/25/14 0440  HGB 12.7 13.6 11.4*  HCT 37.8 39.8 34.5*    WBC 9.0 12.7* 9.7  PLT 328 360 343    cxr 09/25/14 less flluid, no change bilateral infiltrates   ASSESSMENT / PLAN:  CAP Left parapneumonic effusion> not complicated based on normal glucose and no septations on CT At risk relative AI - pt on chronic steroid therapy and Remicade (PPD negative) for Crohn's. Acute hypoxemic respiratory failure from pneumonia - Levaquin/vanc / flagyl  10/9>>> Recs: Supplemental O2 as needed to maintain SpO2 > 92%.  No further pleural fluid drainage needed at this point unless culture becomes positive (don't expect that) Out of bed Flutter valve Incentive spirometry Continue prednisone  10 mg daily  Does not need flagyl   from pulmonary perspective but ok to use if covering any GI issues - vanc probably not neede here    Needs f/u cxr on 10/12 and we will see her again then.     Christinia Gully, MD Pulmonary and [place] [phone] After 5:30 PM or weekends, call [phone]

## 2014-09-26 NOTE — Progress Notes (Signed)
Pt temp 100.8, HR 124, BP 138/86.  Notified MD.  Will continue to monitor.

## 2014-09-27 DIAGNOSIS — E860 Dehydration: Secondary | ICD-10-CM

## 2014-09-27 LAB — CBC
HCT: 31.3 % — ABNORMAL LOW (ref 36.0–46.0)
Hemoglobin: 10.7 g/dL — ABNORMAL LOW (ref 12.0–15.0)
MCH: 28.3 pg (ref 26.0–34.0)
MCHC: 34.2 g/dL (ref 30.0–36.0)
MCV: 82.8 fL (ref 78.0–100.0)
PLATELETS: 263 10*3/uL (ref 150–400)
RBC: 3.78 MIL/uL — ABNORMAL LOW (ref 3.87–5.11)
RDW: 13.6 % (ref 11.5–15.5)
WBC: 5.9 10*3/uL (ref 4.0–10.5)

## 2014-09-27 LAB — BASIC METABOLIC PANEL
ANION GAP: 11 (ref 5–15)
BUN: 4 mg/dL — ABNORMAL LOW (ref 6–23)
CO2: 23 mEq/L (ref 19–32)
Calcium: 8.3 mg/dL — ABNORMAL LOW (ref 8.4–10.5)
Chloride: 102 mEq/L (ref 96–112)
Creatinine, Ser: 0.66 mg/dL (ref 0.50–1.10)
GFR calc Af Amer: >90 mL/min (ref >90)
Glucose, Bld: 118 mg/dL — ABNORMAL HIGH (ref 70–99)
POTASSIUM: 3 meq/L — AB (ref 3.7–5.3)
SODIUM: 136 meq/L — AB (ref 137–147)

## 2014-09-27 MED ORDER — LEVALBUTEROL HCL 0.63 MG/3ML IN NEBU: 0.6300 mg | INHALATION_SOLUTION | Freq: Four times a day (QID) | RESPIRATORY_TRACT | Status: DC | PRN

## 2014-09-27 MED ORDER — POTASSIUM CHLORIDE CRYS ER 20 MEQ PO TBCR
40.0000 meq | EXTENDED_RELEASE_TABLET | ORAL | Status: AC
  Administered 2014-09-27 (×2): 40 meq via ORAL
  Filled 2014-09-27 (×2): qty 2

## 2014-09-27 NOTE — Progress Notes (Signed)
[facility] PROGRESS NOTE  Meghan Charles DVV:[id number] DOB: 01-Feb-1971 DOA: 09/24/2014 PCP: No primary provider on file.  Assessment/Plan: 1. Community acquired pneumonia, para pneumonic effusion-  Patient was started on on vancomycin, levaquin, supplemental oxygen, flutter valve, s/p thoracentesis. Pulmonary recommends only Levaquin, will discontinue vancomycin at this time. 2. Para pneumonic effusion- s/p thoracentesis, pleural fluid analysis shows  Wbc 3413, hazy appearance, culture is pending. 3. Crohn's colitis- CT scan done on 10/6  shows thickened loops of ileum consistent with Crohn's disease not causing obstruction. Started on IV Flagyl, prednisone 10 mg po daily. 4. Hypokalemia-replace potassium and check BMP in a.m. 5. GERD- Continue with protonix daily. 6. Sinus tachycardia- likely due to the pneumonia, and also anxiety contributing to it. Xanax 0.5 mg 3 times a day.Will add metoprolol for 12.5 milligrams twice a day  Code Status: *Full code Family Communication: *No family at bedside Disposition Plan: Home when stable   Consultants:  Pulmonary  Procedures:  Thoracentesis  Antibiotics:  Vancomycin 10/8  Levaquin 10/8  HPI/Subjective: 43 y.o. female With history of Crohn's disease diagnosed in June of 2015 who was started on Remicade and has received one dose on 09/03/2014 on chronic prednisone therapy, anxiety, who presents to the ED with five-day history of intermittent fevers, nausea and emesis. Patient states that 5 days prior to admission had a temperature of 102 and felt better subsequently after that for the next 2 days. 2 days prior to admission patient stated awoke around 3:15 in the morning noted to have a temperature of 101.7 with some associated nausea nonbilious emesis x3.    Patient was seen in the emergency room and noted to have a temp of 100.1 with heart rate of 132 and blood pressure of 129/77. Labs obtained had a comprehensive metabolic profile  which was unremarkable. CBC obtained had a white count of 12.7 with a left shift. Urine pregnancy test was negative. Urinalysis which was done was cloudy with a specific gravity of greater than 1.046 some ketones some protein nitrite negative leukocytes -0-2 WBCs.  CT angiogram of the chest done showed bilateral multifocal multilobar airspace disease most consistent with pneumonia. Reactive sized prevascular lymph node. Moderate sized simple left pleural effusion. Negative for PE.  We were called to admit the patient for further evaluation and management.   Patient feels better this morning,    Objective: Filed Vitals:   09/27/14 1400  BP: 128/78  Pulse: 128  Temp: 99.4 F (37.4 C)  Resp: 20    Intake/Output Summary (Last 24 hours) at 09/27/14 1423 Last data filed at 09/27/14 1402  Gross per 24 hour  Intake 1216.67 ml  Output      0 ml  Net 1216.67 ml   Filed Weights   09/24/14 1725 09/24/14 1807  Weight: 66.679 kg (147 lb) 67.6 kg (149 lb 0.5 oz)    Exam:  Physical Exam: Head: Normocephalic, atraumatic.  Eyes: No signs of jaundice, EOMI Lungs: Normal respiratory effort. Crackles at the left lung base Heart: Regular RR. S1 and S2 normal  Abdomen: BS normoactive. Soft, Nondistended, non-tender.  Extremities: No pretibial edema, no erythema  Data Reviewed: Basic Metabolic Panel:  Recent Labs Lab 09/22/14 1021 09/24/14 1126 09/24/14 1844 09/25/14 0440 09/26/14 0903 09/27/14 0500  NA 136* 137  --  132* 133* 136*  K 4.7 3.9  --  3.6* 3.5* 3.0*  CL 99 98  --  98 97 102  CO2 22 24  --  20 22 23   GLUCOSE  111* 111*  --  138* 119* 118*  BUN 15 18  --  6 3* 4*  CREATININE 0.62 0.70  --  0.61 0.67 0.66  CALCIUM 9.1 9.3  --  8.2* 8.4 8.3*  MG  --   --  2.0  --   --   --   PHOS  --   --  3.8  --   --   --    Liver Function Tests:  Recent Labs Lab 09/22/14 1021 09/24/14 1126  AST 13 23  ALT 14 25  ALKPHOS 60 65  BILITOT 0.4 0.4  PROT 7.9 8.3  ALBUMIN 3.5 3.5     Recent Labs Lab 09/22/14 1021  LIPASE 32   No results found for this basename: AMMONIA,  in the last 168 hours CBC:  Recent Labs Lab 09/22/14 1021 09/24/14 1126 09/25/14 0440 09/26/14 0903 09/27/14 0500  WBC 9.0 12.7* 9.7 9.1 5.9  NEUTROABS 6.5 10.3* 7.2 6.5  --   HGB 12.7 13.6 11.4* 11.9* 10.7*  HCT 37.8 39.8 34.5* 35.6* 31.3*  MCV 84.6 84.3 84.4 84.4 82.8  PLT 328 360 343 326 263   Cardiac Enzymes: No results found for this basename: CKTOTAL, CKMB, CKMBINDEX, TROPONINI,  in the last 168 hours BNP (last 3 results) No results found for this basename: PROBNP,  in the last 8760 hours CBG: No results found for this basename: GLUCAP,  in the last 168 hours  Recent Results (from the past 240 hour(s))  CULTURE, BLOOD (ROUTINE X 2)     Status: None   Collection Time    09/24/14  4:58 PM      Result Value Ref Range Status   Specimen Description BLOOD LEFT ANTECUBITAL   Final   Special Requests BOTTLES DRAWN AEROBIC AND ANAEROBIC LAC   Final   Culture  Setup Time     Final   Value: 09/24/2014 23:04     Performed at [facility]   Culture     Final   Value:        BLOOD CULTURE RECEIVED NO GROWTH TO DATE CULTURE WILL BE HELD FOR 5 DAYS BEFORE ISSUING A FINAL NEGATIVE REPORT     Performed at [facility]   Report Status PENDING   Incomplete  CULTURE, BLOOD (ROUTINE X 2)     Status: None   Collection Time    09/24/14  5:03 PM      Result Value Ref Range Status   Specimen Description BLOOD RIGHT ANTECUBITAL   Final   Special Requests BOTTLES DRAWN AEROBIC AND ANAEROBIC 5 CC   Final   Culture  Setup Time     Final   Value: 09/24/2014 22:50     Performed at [facility]   Culture     Final   Value:        BLOOD CULTURE RECEIVED NO GROWTH TO DATE CULTURE WILL BE HELD FOR 5 DAYS BEFORE ISSUING A FINAL NEGATIVE REPORT     Performed at [facility]   Report Status PENDING   Incomplete  CULTURE, EXPECTORATED SPUTUM-ASSESSMENT     Status: None    Collection Time    09/25/14  6:37 AM      Result Value Ref Range Status   Specimen Description SPUTUM   Final   Special Requests Immunocompromised   Final   Sputum evaluation     Final   Value: MICROSCOPIC FINDINGS SUGGEST THAT THIS SPECIMEN IS NOT REPRESENTATIVE OF LOWER  RESPIRATORY SECRETIONS. PLEASE RECOLLECT.     NOTIFIED RN AT 0700 ON 10.9.15 BY [place]   Report Status 09/25/2014 FINAL   Final  CLOSTRIDIUM DIFFICILE BY PCR     Status: None   Collection Time    09/25/14  8:50 AM      Result Value Ref Range Status   C difficile by pcr NEGATIVE  NEGATIVE Final   Comment: Performed at [facility]  BODY FLUID CULTURE     Status: None   Collection Time    09/25/14  9:11 AM      Result Value Ref Range Status   Specimen Description PLEURAL LEFT   Final   Special Requests NONE   Final   Gram Stain     Final   Value: RARE WBC PRESENT, PREDOMINANTLY MONONUCLEAR     NO ORGANISMS SEEN     Performed at [facility]   Culture     Final   Value: NO GROWTH 1 DAY     Performed at [facility]   Report Status PENDING   Incomplete     Studies: No results found.  Scheduled Meds: . calcium carbonate  1,250 mg Oral Daily  . cholecalciferol  1,000 Units Oral Daily  . enoxaparin (LOVENOX) injection  40 mg Subcutaneous Q24H  . feeding supplement (RESOURCE BREEZE)  1 Container Oral TID WC  . guaiFENesin  1,200 mg Oral BID  . levalbuterol  0.63 mg Nebulization TID  . levofloxacin (LEVAQUIN) IV  750 mg Intravenous Q24H  . metoprolol tartrate  12.5 mg Oral BID  . metronidazole  500 mg Intravenous Q8H  . pantoprazole  40 mg Oral Daily  . potassium chloride  40 mEq Oral Q4H  . [START ON 10/03/2014] predniSONE  10 mg Oral QAC breakfast  . zolpidem  5 mg Oral Once   Continuous Infusions: . sodium chloride 100 mL/hr at 09/27/14 0345    Principal Problem:   PNA (pneumonia) Active Problems:   Abdominal pain   Crohn's regional enteritis   Gastroesophageal reflux  disease without esophagitis   Pleural effusion   Nausea with vomiting   Leukocytosis   Sinus tachycardia   Dehydration   Colitis:???    Time spent: 25 min    [facility] Pager [phone] . If 7PM-7AM, please contact night-coverage at [email or web address], password [facility] 09/27/2014, 2:23 PM  LOS: 3 days

## 2014-09-27 NOTE — Progress Notes (Signed)
ANTIBIOTIC CONSULT NOTE - FOLLOW UP  Pharmacy Consult for Levaquin Indication: pneumonia  No Known Allergies  Patient Measurements: Height: 5\' 8"  (172.7 cm) Weight: 149 lb 0.5 oz (67.6 kg) IBW/kg (Calculated) : 63.9  Vital Signs: Temp: 98.1 F (36.7 C) (10/11 0516) Temp Source: Oral (10/11 0516) BP: 139/96 mmHg (10/11 1028) Pulse Rate: 142 (10/11 1028) Intake/Output from previous day: 10/10 0701 - 10/11 0700 In: 470 [P.O.:70; IV Piggyback:400] Out: -   Labs:  Recent Labs  09/25/14 0440 09/26/14 0903 09/27/14 0500  WBC 9.7 9.1 5.9  HGB 11.4* 11.9* 10.7*  PLT 343 326 263  CREATININE 0.61 0.67 0.66   Estimated Creatinine Clearance: 91.5 ml/min (by C-G formula based on Cr of 0.66). No results found for this basename: VANCOTROUGH, Corlis Leak, VANCORANDOM, [place], [place], [place], TOBRATROUGH, TOBRAPEAK, TOBRARND, AMIKACINPEAK, AMIKACINTROU, AMIKACIN,  in the last 72 hours   Microbiology: Recent Results (from the past 720 hour(s))  CULTURE, BLOOD (ROUTINE X 2)     Status: None   Collection Time    09/24/14  4:58 PM      Result Value Ref Range Status   Specimen Description BLOOD LEFT ANTECUBITAL   Final   Special Requests BOTTLES DRAWN AEROBIC AND ANAEROBIC LAC   Final   Culture  Setup Time     Final   Value: 09/24/2014 23:04     Performed at [facility]   Culture     Final   Value:        BLOOD CULTURE RECEIVED NO GROWTH TO DATE CULTURE WILL BE HELD FOR 5 DAYS BEFORE ISSUING A FINAL NEGATIVE REPORT     Performed at [facility]   Report Status PENDING   Incomplete  CULTURE, BLOOD (ROUTINE X 2)     Status: None   Collection Time    09/24/14  5:03 PM      Result Value Ref Range Status   Specimen Description BLOOD RIGHT ANTECUBITAL   Final   Special Requests BOTTLES DRAWN AEROBIC AND ANAEROBIC 5 CC   Final   Culture  Setup Time     Final   Value: 09/24/2014 22:50     Performed at [facility]   Culture     Final   Value:         BLOOD CULTURE RECEIVED NO GROWTH TO DATE CULTURE WILL BE HELD FOR 5 DAYS BEFORE ISSUING A FINAL NEGATIVE REPORT     Performed at [facility]   Report Status PENDING   Incomplete  CULTURE, EXPECTORATED SPUTUM-ASSESSMENT     Status: None   Collection Time    09/25/14  6:37 AM      Result Value Ref Range Status   Specimen Description SPUTUM   Final   Special Requests Immunocompromised   Final   Sputum evaluation     Final   Value: MICROSCOPIC FINDINGS SUGGEST THAT THIS SPECIMEN IS NOT REPRESENTATIVE OF LOWER RESPIRATORY SECRETIONS. PLEASE RECOLLECT.     NOTIFIED RN AT 0700 ON 10.9.15 BY [place]   Report Status 09/25/2014 FINAL   Final  CLOSTRIDIUM DIFFICILE BY PCR     Status: None   Collection Time    09/25/14  8:50 AM      Result Value Ref Range Status   C difficile by pcr NEGATIVE  NEGATIVE Final   Comment: Performed at [facility]     Status: None   Collection Time    09/25/14  9:11 AM  Result Value Ref Range Status   Specimen Description PLEURAL LEFT   Final   Special Requests NONE   Final   Gram Stain     Final   Value: RARE WBC PRESENT, PREDOMINANTLY MONONUCLEAR     NO ORGANISMS SEEN     Performed at [facility]   Culture     Final   Value: NO GROWTH 1 DAY     Performed at [facility]   Report Status PENDING   Incomplete    Anti-infectives: 10/8 >> Levaquin x 5 days >> 10/8 >> Vancomycin >> 10/10 10/8 >> Metronidazole >>  Assessment: 46 yoF presenting to ED on 10/8 with c/o N/V/D and fever.  She also presented to ED on 10/6, but was sent home with Azithromycin.  PMH includes Crohn's disease on chronic predisone and remicade (immunocompromised).  CT with multifocal pneumonia.  She also had para pneumonic effusion s/p thoracentesis 10/9.  Metronidazole per MD for r/o Cdiff (negative for Cdiff).  Pharmacy is consulted to dose Levaquin for CAP.  Today, 10/11: Day # 4/5 Levaquin  Tmax:  100.8  WBCs: decreased to WNL  (prednisone)  Renal:  SCr 0.66, CrCl ~ 91 ml/min  Goal of Therapy:  Appropriate abx dosing, eradication of infection.   Plan:   Levaquin 750mg  IV q24h  Follow up renal function and cultures as available.   Gretta Arab PharmD, BCPS Pager [phone] 09/27/2014 12:51 PM

## 2014-09-28 ENCOUNTER — Inpatient Hospital Stay (HOSPITAL_COMMUNITY): Payer: BC Managed Care – PPO

## 2014-09-28 LAB — BODY FLUID CULTURE: CULTURE: NO GROWTH

## 2014-09-28 LAB — BODY FLUID CELL COUNT WITH DIFFERENTIAL
Lymphs, Fluid: 88 %
Monocyte-Macrophage-Serous Fluid: 9 % — ABNORMAL LOW (ref 50–90)
NEUTROPHIL FLUID: 3 % (ref 0–25)
WBC FLUID: 1927 uL — AB (ref 0–1000)

## 2014-09-28 LAB — PROTEIN, TOTAL: TOTAL PROTEIN: 6.4 g/dL (ref 6.0–8.3)

## 2014-09-28 LAB — CBC
HCT: 31.6 % — ABNORMAL LOW (ref 36.0–46.0)
Hemoglobin: 10.8 g/dL — ABNORMAL LOW (ref 12.0–15.0)
MCH: 28.2 pg (ref 26.0–34.0)
MCHC: 34.2 g/dL (ref 30.0–36.0)
MCV: 82.5 fL (ref 78.0–100.0)
Platelets: 336 10*3/uL (ref 150–400)
RBC: 3.83 MIL/uL — ABNORMAL LOW (ref 3.87–5.11)
RDW: 13.6 % (ref 11.5–15.5)
WBC: 7.7 10*3/uL (ref 4.0–10.5)

## 2014-09-28 LAB — PROTEIN, BODY FLUID: Total protein, fluid: 4.4 g/dL

## 2014-09-28 LAB — BASIC METABOLIC PANEL
Anion gap: 13 (ref 5–15)
BUN: 4 mg/dL — AB (ref 6–23)
CO2: 21 mEq/L (ref 19–32)
Calcium: 8.3 mg/dL — ABNORMAL LOW (ref 8.4–10.5)
Chloride: 100 mEq/L (ref 96–112)
Creatinine, Ser: 0.65 mg/dL (ref 0.50–1.10)
GFR calc Af Amer: >90 mL/min (ref >90)
Glucose, Bld: 88 mg/dL (ref 70–99)
Potassium: 4.4 mEq/L (ref 3.7–5.3)
Sodium: 134 mEq/L — ABNORMAL LOW (ref 137–147)

## 2014-09-28 LAB — LACTATE DEHYDROGENASE, PLEURAL OR PERITONEAL FLUID: LD, Fluid: 169 U/L — ABNORMAL HIGH (ref 3–23)

## 2014-09-28 LAB — LACTATE DEHYDROGENASE: LDH: 208 U/L (ref 94–250)

## 2014-09-28 MED ORDER — METOPROLOL TARTRATE 25 MG PO TABS
25.0000 mg | ORAL_TABLET | Freq: Two times a day (BID) | ORAL | Status: DC
  Administered 2014-09-28 – 2014-09-29 (×2): 25 mg via ORAL
  Filled 2014-09-28 (×3): qty 1

## 2014-09-28 NOTE — Progress Notes (Signed)
   Name: Meghan Charles MRN: [id number] DOB: Mar 28, 1971    ADMISSION DATE:  09/24/2014 CONSULTATION DATE:  09/24/14  REFERRING MD :  Grandville Silos  CHIEF COMPLAINT:  Fever, nausea, vomiting, diarrhea  BRIEF PATIENT DESCRIPTION: 89  yowf never smoker with Crohns dz on Remicade  brought to ED with fever, nausea, vomiting x 5 days.  Found to have bilateral PNA and left pleural effusion.  PCCM consulted for recs.  SIGNIFICANT EVENTS  10/6 presented to ED and discharged with Zpack. 10/8 returns to ED with same symptoms, admitted with PNA and left pleural effusion.  STUDIES:  CT abd/pelvis 10/6 >>> thickened loop of ileum, stable rt adrenal adenoma CTA Chest 10/8 >>> b/l ASD, moderate Lt pleural effusion Lt thoracentesis 10/9 >>> 1 liter fluid, protein 4.8, LDH 349, glucose 90, WBC 3413 (21%N, 56%L, 23%M)  SUBJECTIVE:  No longer needing oxygen.  Still has pain in Lt side with breathing, but improved.  Still has cough, but less sputum.  Walked in hall yesterday.  VITAL SIGNS: Temp:  [98.1 F (36.7 C)-99.4 F (37.4 C)] 98.1 F (36.7 C) (10/12 0502) Pulse Rate:  [109-128] 109 (10/12 0502) Resp:  [19-24] 19 (10/12 0502) BP: (118-149)/(78-91) 118/81 mmHg (10/12 0502) SpO2:  [93 %-96 %] 96 % (10/11 2148)  PHYSICAL EXAMINATION: General: no distress HEENT: no sinus tenderness PULM: scattered rhonchi, dull to percussion at Lt base, no wheeze CV: regular AB: soft, nontender Ext: no edema Neuro: normal strength  CBC Recent Labs     09/26/14  0903  09/27/14  0500  09/28/14  0415  WBC  9.1  5.9  7.7  HGB  11.9*  10.7*  10.8*  HCT  35.6*  31.3*  31.6*  PLT  326  263  336    BMET Recent Labs     09/26/14  0903  09/27/14  0500  09/28/14  0415  NA  133*  136*  134*  K  3.5*  3.0*  4.4  CL  97  102  100  CO2  22  23  21   BUN  3*  4*  4*  CREATININE  0.67  0.66  0.65  GLUCOSE  119*  118*  88    Electrolytes Recent Labs     09/26/14  0903  09/27/14  0500  09/28/14  0415    CALCIUM  8.4  8.3*  8.3*   Imaging No results found.   ASSESSMENT / PLAN:  Acute hypoxic respiratory failure 2nd to CAP with Lt parapneumonia pleural effusion. Plan: F/u 2 view CXR 10/12 and then decide if repeat pleural fluid sampling is needed Day 5 of Abx, currently on levaquin and flagyl Oxygen to keep SpO2 > 92%  Blood cx 10/8 >> Lt pleural fluid cx 10/09 >>  Lt pleural fluid cytology 10/09 >>   Hx of Crohn's disease on chronic steroids and remicade. Plan: Prednisone per primary team Hold remicade in setting of acute infection  Summary: Clinically improving.  Need to f/u CXR today >> if pleural effusion better, then can transition to oral Abx.  If pleural effusion persists/increased, then will need further inpatient evaluation for this.  Chesley Mires, MD [facility] Pulmonary/Critical Care 09/28/2014, 10:56 AM Pager:  [phone] After 3pm call: [phone]

## 2014-09-28 NOTE — Procedures (Signed)
Thoracentesis Procedure Note  Pre-operative Diagnosis: Left effusion    Post-operative Diagnosis: same  Indications: pleural fluid evaluation    Procedure Details  Consent: Informed consent was obtained. Risks of the procedure were discussed including: infection, bleeding, pain, pneumothorax.  Under sterile conditions the patient was positioned. Betadine solution and sterile drapes were utilized.  1% buffered lidocaine was used to anesthetize the pleural space. Fluid was obtained without any difficulties and minimal blood loss.  A dressing was applied to the wound and wound care instructions were provided.   Findings 700 ml of clear pleural fluid was obtained. A sample was sent to Pathology for cytogenetics, flow, and cell counts, as well as for infection analysis.  Complications:  None; patient tolerated the procedure well.          Condition: stable  Plan A follow up chest x-ray was ordered. Bed Rest for 0 hours. Tylenol 650 mg. for pain.  Attending Attestation: I was present and scrubbed for the entire procedure.    Merton Border, MD ; [facility] [phone].  After 5:30 PM or weekends, call [phone]

## 2014-09-28 NOTE — Telephone Encounter (Signed)
I have spoken to [place] at [facility]. She states that she has left 2 messages for [place], our insurance coordinator to call back to answer 2 additional questions. Since they have not received a reply, I asked if I could give them clinical information. They asked if patient had active infection. I explained that as of right now, she does but that this would be taken care of prior to her restarting this medication. She also asked if there was any demylenating disease and I answered no to that. They will make a determination from this.

## 2014-09-28 NOTE — Progress Notes (Signed)
[facility] PROGRESS NOTE  Meghan Charles ALP:[id number] DOB: [birth year] DOA: 09/24/2014 PCP: No primary provider on file.  Assessment/Plan: 1. Community acquired pneumonia, para pneumonic effusion-  Patient was started on on vancomycin, levaquin, supplemental oxygen, flutter valve, s/p thoracentesis. Pulmonary recommends only Levaquin, will discontinue vancomycin at this time. 2. Para pneumonic effusion- s/p thoracentesis, pleural fluid analysis shows  Wbc 3413, hazy appearance, culture is pending. 3. Crohn's colitis- CT scan done on 10/6  shows thickened loops of ileum consistent with Crohn's disease not causing obstruction. Started on IV Flagyl, prednisone 10 mg po daily. 4. GERD- Continue with protonix daily. 5. Sinus tachycardia- likely due to the pneumonia, and also anxiety contributing to it. Xanax 0.5 mg 3 times a day.Will increase the does of metoprolol to 25 mg twice a day  Code Status: *Full code Family Communication: *No family at bedside Disposition Plan: Home when stable   Consultants:  Pulmonary  Procedures:  Thoracentesis  Antibiotics:  Vancomycin 10/8  Levaquin 10/8  HPI/Subjective: 43 y.o. female  With history of Crohn's disease diagnosed in June of 2015 who was started on Remicade and has received one dose on 09/03/2014 on chronic prednisone therapy, anxiety, who presents to the ED with five-day history of intermittent fevers, nausea and emesis.  Patient states that 5 days prior to admission had a temperature of 102 and felt better subsequently after that for the next 2 days. 2 days prior to admission patient stated awoke around 3:15 in the morning noted to have a temperature of 101.7 with some associated nausea nonbilious emesis x3. Patient subsequently presented to the ED which he states was given some pain medications IV fluids and anti-medics. CT scan was done that showed thickening around the colon and are noted left pleural effusion. Patient said at  that time was done to develop a nonproductive cough and was subsequently discharged on a Z-Pak. Patient states 1 day prior to admission had a temperature on a 1.5 at work with some nausea and emesis which was ongoing for 2 hours. The day of admission while at work patient noted she has some nausea and emesis and subsequently presented to the ED. Patient endorses some chills, some associated shortness of breath, nonproductive cough, diarrhea/loose stools with 3-4 episodes one day prior to admission and right lower quadrant abdominal pain she states is unchanged from her chronic abdominal pain secondary to her Crohn's. Patient also endorses some generalized weakness. Patient denies any chest pain, no constipation, no dysuria, no melena, no hematemesis, no hematochezia.  Patient was seen in the emergency room and noted to have a temp of 100.1 with heart rate of 132 and blood pressure of 129/77. Labs obtained had a comprehensive metabolic profile which was unremarkable. CBC obtained had a white count of 12.7 with a left shift. Urine pregnancy test was negative. Urinalysis which was done was cloudy with a specific gravity of greater than 1.046 some ketones some protein nitrite negative leukocytes -0-2 WBCs.  CT angiogram of the chest done showed bilateral multifocal multilobar airspace disease most consistent with pneumonia. Reactive sized prevascular lymph node. Moderate sized simple left pleural effusion. Negative for PE.  We were called to admit the patient for further evaluation and management.   Patient feels better this morning, still having tachycardia.   Objective: Filed Vitals:   09/28/14 1450  BP: 137/93  Pulse: 143  Temp: 99.3 F (37.4 C)  Resp: 20    Intake/Output Summary (Last 24 hours) at 09/28/14 1632 Last data filed at  09/28/14 1300  Gross per 24 hour  Intake 2353.33 ml  Output      0 ml  Net 2353.33 ml   Filed Weights   09/24/14 1725 09/24/14 1807  Weight: 66.679 kg (147 lb)  67.6 kg (149 lb 0.5 oz)    Exam:  Physical Exam: Head: Normocephalic, atraumatic.  Eyes: No signs of jaundice, EOMI Lungs: Normal respiratory effort. Crackles at the left lung base Heart: Regular RR. S1 and S2 normal  Abdomen: BS normoactive. Soft, Nondistended, non-tender.  Extremities: No pretibial edema, no erythema  Data Reviewed: Basic Metabolic Panel:  Recent Labs Lab 09/24/14 1126 09/24/14 1844 09/25/14 0440 09/26/14 0903 09/27/14 0500 09/28/14 0415  NA 137  --  132* 133* 136* 134*  K 3.9  --  3.6* 3.5* 3.0* 4.4  CL 98  --  98 97 102 100  CO2 24  --  20 22 23 21   GLUCOSE 111*  --  138* 119* 118* 88  BUN 18  --  6 3* 4* 4*  CREATININE 0.70  --  0.61 0.67 0.66 0.65  CALCIUM 9.3  --  8.2* 8.4 8.3* 8.3*  MG  --  2.0  --   --   --   --   PHOS  --  3.8  --   --   --   --    Liver Function Tests:  Recent Labs Lab 09/22/14 1021 09/24/14 1126 09/28/14 0415  AST 13 23  --   ALT 14 25  --   ALKPHOS 60 65  --   BILITOT 0.4 0.4  --   PROT 7.9 8.3 6.4  ALBUMIN 3.5 3.5  --     Recent Labs Lab 09/22/14 1021  LIPASE 32   No results found for this basename: AMMONIA,  in the last 168 hours CBC:  Recent Labs Lab 09/22/14 1021 09/24/14 1126 09/25/14 0440 09/26/14 0903 09/27/14 0500 09/28/14 0415  WBC 9.0 12.7* 9.7 9.1 5.9 7.7  NEUTROABS 6.5 10.3* 7.2 6.5  --   --   HGB 12.7 13.6 11.4* 11.9* 10.7* 10.8*  HCT 37.8 39.8 34.5* 35.6* 31.3* 31.6*  MCV 84.6 84.3 84.4 84.4 82.8 82.5  PLT 328 360 343 326 263 336   Cardiac Enzymes: No results found for this basename: CKTOTAL, CKMB, CKMBINDEX, TROPONINI,  in the last 168 hours BNP (last 3 results) No results found for this basename: PROBNP,  in the last 8760 hours CBG: No results found for this basename: GLUCAP,  in the last 168 hours  Recent Results (from the past 240 hour(s))  CULTURE, BLOOD (ROUTINE X 2)     Status: None   Collection Time    09/24/14  4:58 PM      Result Value Ref Range Status   Specimen  Description BLOOD LEFT ANTECUBITAL   Final   Special Requests BOTTLES DRAWN AEROBIC AND ANAEROBIC LAC   Final   Culture  Setup Time     Final   Value: 09/24/2014 23:04     Performed at [facility]   Culture     Final   Value:        BLOOD CULTURE RECEIVED NO GROWTH TO DATE CULTURE WILL BE HELD FOR 5 DAYS BEFORE ISSUING A FINAL NEGATIVE REPORT     Performed at [facility]   Report Status PENDING   Incomplete  CULTURE, BLOOD (ROUTINE X 2)     Status: None   Collection Time    09/24/14  5:03 PM      Result Value Ref Range Status   Specimen Description BLOOD RIGHT ANTECUBITAL   Final   Special Requests BOTTLES DRAWN AEROBIC AND ANAEROBIC 5 CC   Final   Culture  Setup Time     Final   Value: 09/24/2014 22:50     Performed at [facility]   Culture     Final   Value:        BLOOD CULTURE RECEIVED NO GROWTH TO DATE CULTURE WILL BE HELD FOR 5 DAYS BEFORE ISSUING A FINAL NEGATIVE REPORT     Performed at [facility]   Report Status PENDING   Incomplete  CULTURE, EXPECTORATED SPUTUM-ASSESSMENT     Status: None   Collection Time    09/25/14  6:37 AM      Result Value Ref Range Status   Specimen Description SPUTUM   Final   Special Requests Immunocompromised   Final   Sputum evaluation     Final   Value: MICROSCOPIC FINDINGS SUGGEST THAT THIS SPECIMEN IS NOT REPRESENTATIVE OF LOWER RESPIRATORY SECRETIONS. PLEASE RECOLLECT.     NOTIFIED RN AT 0700 ON 10.9.15 BY SHUEA   Report Status 09/25/2014 FINAL   Final  CLOSTRIDIUM DIFFICILE BY PCR     Status: None   Collection Time    09/25/14  8:50 AM      Result Value Ref Range Status   C difficile by pcr NEGATIVE  NEGATIVE Final   Comment: Performed at [facility]     Status: None   Collection Time    09/25/14  9:11 AM      Result Value Ref Range Status   Specimen Description PLEURAL LEFT   Final   Special Requests NONE   Final   Gram Stain     Final   Value: RARE WBC PRESENT,  PREDOMINANTLY MONONUCLEAR     NO ORGANISMS SEEN     Performed at [facility]   Culture     Final   Value: NO GROWTH 3 DAYS     Performed at [facility]   Report Status 09/28/2014 FINAL   Final     Studies: Dg Chest 2 View  09/28/2014   CLINICAL DATA:  43 year old female status post left side thoracentesis with removal of 700 mL of pleural fluid. Improved shortness of breath and chest pain. Initial encounter.  EXAM: CHEST  2 VIEW  COMPARISON:  1131 hr today and earlier.  FINDINGS: Decreased left pleural effusion and known pneumothorax identified following thoracentesis. Widespread bilateral Patchy and confluent abnormal pulmonary opacity persists and is otherwise stable. Stable cardiac size and mediastinal contours. Visualized tracheal air column is within normal limits. No acute osseous abnormality identified.  IMPRESSION: No pneumothorax and decreased pleural effusion following left-sided thoracentesis. Otherwise stable.   Electronically Signed   By: Lars Pinks M.D.   On: 09/28/2014 14:53   Dg Chest 2 View  09/28/2014   CLINICAL DATA:  Shortness of breath.  Nonsmoker.  EXAM: CHEST  2 VIEW  COMPARISON:  Chest x-ray 09/25/2014.  FINDINGS: Mediastinum and hilar structures are normal. Patchy bilateral pulmonary infiltrates are again noted. Left pleural effusion. Pleural effusion has increased slightly in size from prior exam. No pneumothorax. Heart size normal. No acute bony abnormality.  IMPRESSION: 1. Patchy bilateral pulmonary infiltrates again noted. 2. Left pleural effusion, slightly increased from prior study.   Electronically Signed   By: Marcello Moores  Register  On: 09/28/2014 12:17    Scheduled Meds: . calcium carbonate  1,250 mg Oral Daily  . cholecalciferol  1,000 Units Oral Daily  . enoxaparin (LOVENOX) injection  40 mg Subcutaneous Q24H  . feeding supplement (RESOURCE BREEZE)  1 Container Oral TID WC  . guaiFENesin  1,200 mg Oral BID  . levofloxacin (LEVAQUIN) IV  750 mg  Intravenous Q24H  . metoprolol tartrate  25 mg Oral BID  . metronidazole  500 mg Intravenous Q8H  . pantoprazole  40 mg Oral Daily  . [START ON 10/03/2014] predniSONE  10 mg Oral QAC breakfast  . zolpidem  5 mg Oral Once   Continuous Infusions: . sodium chloride 100 mL/hr at 09/28/14 0343    Principal Problem:   PNA (pneumonia) Active Problems:   Abdominal pain   Crohn's regional enteritis   Gastroesophageal reflux disease without esophagitis   Pleural effusion   Nausea with vomiting   Leukocytosis   Sinus tachycardia   Dehydration   Colitis:???    Time spent: 25 min    [facility] Pager [phone] . If 7PM-7AM, please contact night-coverage at [email or web address], password [email or web address] 09/28/2014, 4:32 PM  LOS: 4 days

## 2014-09-29 ENCOUNTER — Inpatient Hospital Stay (HOSPITAL_COMMUNITY): Payer: BC Managed Care – PPO

## 2014-09-29 DIAGNOSIS — J9601 Acute respiratory failure with hypoxia: Secondary | ICD-10-CM

## 2014-09-29 MED ORDER — OXYCODONE-ACETAMINOPHEN 5-325 MG PO TABS: 1.0000 | ORAL_TABLET | Freq: Three times a day (TID) | ORAL | Status: DC | PRN

## 2014-09-29 MED ORDER — METOPROLOL TARTRATE 25 MG PO TABS: 25.0000 mg | ORAL_TABLET | Freq: Two times a day (BID) | ORAL | Status: DC

## 2014-09-29 MED ORDER — METRONIDAZOLE 500 MG PO TABS
500.0000 mg | ORAL_TABLET | Freq: Three times a day (TID) | ORAL | Status: DC
  Filled 2014-09-29 (×3): qty 1

## 2014-09-29 MED ORDER — LEVOFLOXACIN 750 MG PO TABS: 750.0000 mg | ORAL_TABLET | Freq: Every day | ORAL | Status: DC

## 2014-09-29 MED ORDER — ALPRAZOLAM 0.5 MG PO TABS: 0.5000 mg | ORAL_TABLET | Freq: Every evening | ORAL | Status: DC | PRN

## 2014-09-29 MED ORDER — GUAIFENESIN ER 600 MG PO TB12: 1200.0000 mg | ORAL_TABLET | Freq: Two times a day (BID) | ORAL | Status: DC

## 2014-09-29 MED ORDER — LEVOFLOXACIN 750 MG PO TABS
750.0000 mg | ORAL_TABLET | Freq: Every day | ORAL | Status: DC
  Filled 2014-09-29: qty 1

## 2014-09-29 NOTE — Progress Notes (Signed)
Name: Meghan Charles MRN: [id number] DOB: 08/10/1971    ADMISSION DATE:  09/24/2014 CONSULTATION DATE:  09/24/14  REFERRING MD :  Grandville Silos  CHIEF COMPLAINT:  Fever, nausea, vomiting, diarrhea  BRIEF PATIENT DESCRIPTION: 43  yowf never smoker with Crohns dz on Remicade  brought to ED with fever, nausea, vomiting x 5 days.  Found to have bilateral PNA and left pleural effusion.  PCCM consulted for recs.  SIGNIFICANT EVENTS  10/6 presented to ED and discharged with Zpack. 10/8 returns to ED with same symptoms, admitted with PNA and left pleural effusion.  STUDIES:  CT abd/pelvis 10/6 >>> thickened loop of ileum, stable rt adrenal adenoma CTA Chest 10/8 >>> b/l ASD, moderate Lt pleural effusion Lt thoracentesis 10/9 >>> 1 liter fluid, protein 4.8, LDH 349, glucose 90, WBC 3413 (21%N, 56%L, 23%M) 10/12: Repeat left thoracentesis: 700 ml. Exudate   SUBJECTIVE:  No longer needing oxygen.  Still has pain in Lt side with breathing, but improved.  Still has cough, but less sputum.  Walked in hall yesterday.  VITAL SIGNS: Temp:  [99.3 F (37.4 C)-99.6 F (37.6 C)] 99.6 F (37.6 C) (10/13 0537) Pulse Rate:  [117-143] 129 (10/13 0537) Resp:  [19-20] 19 (10/13 0537) BP: (128-155)/(90-100) 128/93 mmHg (10/13 0537) SpO2:  [93 %-95 %] 95 % (10/13 0537) Room air  PHYSICAL EXAMINATION: General: no distress HEENT: no sinus tenderness PULM: clear  CV: regular AB: soft, nontender Ext: no edema Neuro: normal strength  CBC Recent Labs     09/27/14  0500  09/28/14  0415  WBC  5.9  7.7  HGB  10.7*  10.8*  HCT  31.3*  31.6*  PLT  263  336    BMET Recent Labs     09/27/14  0500  09/28/14  0415  NA  136*  134*  K  3.0*  4.4  CL  102  100  CO2  23  21  BUN  4*  4*  CREATININE  0.66  0.65  GLUCOSE  118*  88    Electrolytes Recent Labs     09/27/14  0500  09/28/14  0415  CALCIUM  8.3*  8.3*   Imaging Dg Chest 2 View  09/29/2014   CLINICAL DATA:  Follow-up pleural  effusion and pneumonia.  EXAM: CHEST  2 VIEW  COMPARISON:  09/28/2014 and earlier  FINDINGS: Heart size is normal. There patchy infiltrates bilaterally, similar in appearance to the prior study. Small left pleural effusions/pleural thickening is noted.  IMPRESSION: Little interval change in bilateral infiltrates and left pleural effusion/pleural thickening.   Electronically Signed   By: Shon Hale M.D.   On: 09/29/2014 08:27   Dg Chest 2 View  09/28/2014   CLINICAL DATA:  43 year old female status post left side thoracentesis with removal of 700 mL of pleural fluid. Improved shortness of breath and chest pain. Initial encounter.  EXAM: CHEST  2 VIEW  COMPARISON:  1131 hr today and earlier.  FINDINGS: Decreased left pleural effusion and known pneumothorax identified following thoracentesis. Widespread bilateral Patchy and confluent abnormal pulmonary opacity persists and is otherwise stable. Stable cardiac size and mediastinal contours. Visualized tracheal air column is within normal limits. No acute osseous abnormality identified.  IMPRESSION: No pneumothorax and decreased pleural effusion following left-sided thoracentesis. Otherwise stable.   Electronically Signed   By: Lars Pinks M.D.   On: 09/28/2014 14:53   Dg Chest 2 View  09/28/2014   CLINICAL DATA:  Shortness of breath.  Nonsmoker.  EXAM: CHEST  2 VIEW  COMPARISON:  Chest x-ray 09/25/2014.  FINDINGS: Mediastinum and hilar structures are normal. Patchy bilateral pulmonary infiltrates are again noted. Left pleural effusion. Pleural effusion has increased slightly in size from prior exam. No pneumothorax. Heart size normal. No acute bony abnormality.  IMPRESSION: 1. Patchy bilateral pulmonary infiltrates again noted. 2. Left pleural effusion, slightly increased from prior study.   Electronically Signed   By: Marcello Moores  Register   On: 09/28/2014 12:17     ASSESSMENT / PLAN:  Acute hypoxic respiratory failure 2nd to CAP with Lt parapneumonia pleural  effusion. Plan: We will see her on Friday for f/u cxr  Day 6/14 of Abx, currently on levaquin and flagyl Oxygen to keep SpO2 > 92%  Blood cx 10/8 >> Lt pleural fluid cx 10/09 >> neg  Lt pleural fluid cytology 10/09 >>  Left pleural 10/12>>  Hx of Crohn's disease on chronic steroids and remicade. Plan: Prednisone per primary team Hold remicade in setting of acute infection  Summary: Clinically improving. Would transition to oral abx, complete 14 days of abx. We will see in the out pt clinic on Friday. Clear for d/c from our stand point.   Chesley Mires, MD [facility] Pulmonary/Critical Care 09/29/2014, 11:45 AM Pager:  [phone] After 3pm call: [phone]

## 2014-09-29 NOTE — Discharge Summary (Signed)
Physician Discharge Summary  Emmogene Simson EUM:[id number] DOB: 1971/01/01 DOA: 09/24/2014  PCP: No primary provider on file.  Admit date: 09/24/2014 Discharge date: 09/29/2014  Time spent: 45* minutes  Recommendations for Outpatient Follow-up:  1. *Follow up Pulmonary in 3 days  Discharge Diagnoses:  Principal Problem:   PNA (pneumonia) Active Problems:   Abdominal pain   Crohn's regional enteritis   Gastroesophageal reflux disease without esophagitis   Pleural effusion   Nausea with vomiting   Leukocytosis   Sinus tachycardia   Dehydration   Colitis:???   Discharge Condition: *Stable  Diet recommendation: *Low salt diet  Filed Weights   09/24/14 1725 09/24/14 1807  Weight: 66.679 kg (147 lb) 67.6 kg (149 lb 0.5 oz)    History of present illness:  43 y.o. female  With history of Crohn's disease diagnosed in June of 2015 who was started on Remicade and has received one dose on 09/03/2014 on chronic prednisone therapy, anxiety, who presents to the ED with five-day history of intermittent fevers, nausea and emesis.  Patient states that 5 days prior to admission had a temperature of 102 and felt better subsequently after that for the next 2 days. 2 days prior to admission patient stated awoke around 3:15 in the morning noted to have a temperature of 101.7 with some associated nausea nonbilious emesis x3. Patient subsequently presented to the ED which he states was given some pain medications IV fluids and anti-medics. CT scan was done that showed thickening around the colon and are noted left pleural effusion. Patient said at that time was done to develop a nonproductive cough and was subsequently discharged on a Z-Pak. Patient states 1 day prior to admission had a temperature on a 1.5 at work with some nausea and emesis which was ongoing for 2 hours. The day of admission while at work patient noted she has some nausea and emesis and subsequently presented to the ED. Patient  endorses some chills, some associated shortness of breath, nonproductive cough, diarrhea/loose stools with 3-4 episodes one day prior to admission and right lower quadrant abdominal pain she states is unchanged from her chronic abdominal pain secondary to her Crohn's. Patient also endorses some generalized weakness. Patient denies any chest pain, no constipation, no dysuria, no melena, no hematemesis, no hematochezia.   Hospital Course:  Community acquired pneumonia, para pneumonic effusion- Patient was started on on vancomycin, levaquin, supplemental oxygen, flutter valve, s/p thoracentesis. Pulmonary recommends only Levaquin, was discontinued per he he at this time the patient home on Levaquin for 8 more days to complete 14 days of antibiotics for the pneumonia.  and him hiwill discontinue  Para pneumonic effusion- s/p thoracentesis, x2 pleural fluid analysis shows Wbc 3413, hazy appearance, culture is negative. Cytology did not show malignant cells, showed acute and chronic inflammation.  Crohn's colitis- CT scan done on 10/6 shows thickened loops of ileum consistent with Crohn's disease not causing obstruction. Started on IV Flagyl, prednisone 10 mg po daily. We'll discontinue Flagyl and patient will continue with prednisone 5 mg by mouth daily. She'll follow up with the GI for further recommendations.   Sinus tachycardia- likely due to the pneumonia, and also anxiety contributing to it. Xanax 0.5 mg 3 times a day.Will increase the does of metoprolol to 25 mg twice a day Hypertension- patient was started on metoprolol in the hospital, will continue with metoprolol 25 mg twice a day.   Procedures:  Thoracentesis x2  Consultations:  Pulmonary consultation  Discharge Exam: Filed Vitals:  09/29/14 0537  BP: 128/93  Pulse: 129  Temp: 99.6 F (37.6 C)  Resp: 19    General: Appear in no acute distress Cardiovascular: s1s2 RRR Respiratory:  Clear bilaterally  Discharge Instructions You  were cared for by a hospitalist during your hospital stay. If you have any questions about your discharge medications or the care you received while you were in the hospital after you are discharged, you can call the unit and asked to speak with the hospitalist on call if the hospitalist that took care of you is not available. Once you are discharged, your primary care physician will handle any further medical issues. Please note that NO REFILLS for any discharge medications will be authorized once you are discharged, as it is imperative that you return to your primary care physician (or establish a relationship with a primary care physician if you do not have one) for your aftercare needs so that they can reassess your need for medications and monitor your lab values.  Discharge Instructions   Diet - low sodium heart healthy    Complete by:  As directed      Increase activity slowly    Complete by:  As directed           Current Discharge Medication List    START taking these medications   Details  ALPRAZolam (XANAX) 0.5 MG tablet Take 1 tablet (0.5 mg total) by mouth at bedtime as needed for anxiety or sleep. Qty: 10 tablet, Refills: 0    guaiFENesin (MUCINEX) 600 MG 12 hr tablet Take 2 tablets (1,200 mg total) by mouth 2 (two) times daily. Qty: 30 tablet, Refills: 0    levofloxacin (LEVAQUIN) 750 MG tablet Take 1 tablet (750 mg total) by mouth daily. Qty: 8 tablet, Refills: 0    metoprolol tartrate (LOPRESSOR) 25 MG tablet Take 1 tablet (25 mg total) by mouth 2 (two) times daily. Qty: 60 tablet, Refills: 0    oxyCODONE-acetaminophen (ROXICET) 5-325 MG per tablet Take 1 tablet by mouth every 8 (eight) hours as needed for severe pain. Qty: 30 tablet, Refills: 0      CONTINUE these medications which have NOT CHANGED   Details  calcium carbonate 1250 MG capsule Take 1,250 mg by mouth daily.    Cholecalciferol (VITAMIN D) 1000 UNITS capsule Take 1,000 Units by mouth daily.     ondansetron (ZOFRAN) 4 MG tablet Take 1 tablet (4 mg total) by mouth every 6 (six) hours as needed for nausea or vomiting. Qty: 30 tablet, Refills: 0    OVER THE COUNTER MEDICATION Place 2 drops into both eyes daily as needed (dry eyes). [employer] brand eye drops for red/itchy eyes.    predniSONE (DELTASONE) 5 MG tablet Take 5 mg by mouth daily with breakfast.      STOP taking these medications     azithromycin (ZITHROMAX) 250 MG tablet      traMADol (ULTRAM) 50 MG tablet        No Known Allergies Follow-up Information   Follow up with RAMASWAMY,MURALI, MD. (arrive at 3pm for check in, then go to basement for CXR and then appointment at 330 w/ Dr Chase Caller )    Specialty:  Pulmonary Disease   Contact information:   [place]  [id number] [phone]        The results of significant diagnostics from this hospitalization (including imaging, microbiology, ancillary and laboratory) are listed below for reference.    Significant Diagnostic Studies: Dg Chest 1  View  09/25/2014   CLINICAL DATA:  Initial encounter for left-sided chest pain after left thoracentesis.  EXAM: CHEST - 1 VIEW  COMPARISON:  CT chest from 09/22/2014  FINDINGS: No evidence for pneumothorax. Small left pleural effusion noted. Asymmetric patchy airspace disease has a central predominance, similar to what was seen on the previous CT scan. Imaged bony structures of the thorax are intact. Telemetry leads overlie the chest.  IMPRESSION: No evidence for pneumothorax status post thoracentesis.   Electronically Signed   By: Misty Stanley M.D.   On: 09/25/2014 09:41   Dg Chest 2 View  09/29/2014   CLINICAL DATA:  Follow-up pleural effusion and pneumonia.  EXAM: CHEST  2 VIEW  COMPARISON:  09/28/2014 and earlier  FINDINGS: Heart size is normal. There patchy infiltrates bilaterally, similar in appearance to the prior study. Small left pleural effusions/pleural thickening is noted.  IMPRESSION: Little interval  change in bilateral infiltrates and left pleural effusion/pleural thickening.   Electronically Signed   By: Shon Hale M.D.   On: 09/29/2014 08:27   Dg Chest 2 View  09/28/2014   CLINICAL DATA:  43 year old female status post left side thoracentesis with removal of 700 mL of pleural fluid. Improved shortness of breath and chest pain. Initial encounter.  EXAM: CHEST  2 VIEW  COMPARISON:  1131 hr today and earlier.  FINDINGS: Decreased left pleural effusion and known pneumothorax identified following thoracentesis. Widespread bilateral Patchy and confluent abnormal pulmonary opacity persists and is otherwise stable. Stable cardiac size and mediastinal contours. Visualized tracheal air column is within normal limits. No acute osseous abnormality identified.  IMPRESSION: No pneumothorax and decreased pleural effusion following left-sided thoracentesis. Otherwise stable.   Electronically Signed   By: Lars Pinks M.D.   On: 09/28/2014 14:53   Dg Chest 2 View  09/28/2014   CLINICAL DATA:  Shortness of breath.  Nonsmoker.  EXAM: CHEST  2 VIEW  COMPARISON:  Chest x-ray 09/25/2014.  FINDINGS: Mediastinum and hilar structures are normal. Patchy bilateral pulmonary infiltrates are again noted. Left pleural effusion. Pleural effusion has increased slightly in size from prior exam. No pneumothorax. Heart size normal. No acute bony abnormality.  IMPRESSION: 1. Patchy bilateral pulmonary infiltrates again noted. 2. Left pleural effusion, slightly increased from prior study.   Electronically Signed   By: Marcello Moores  Register   On: 09/28/2014 12:17   Ct Angio Chest Pe W/cm &/or Wo Cm  09/24/2014   CLINICAL DATA:  Persistent tachycardia recent diagnosis of pleural effusion. Evaluate for pulmonary embolism. History of Crohn disease.  EXAM: CT ANGIOGRAPHY CHEST WITH CONTRAST  TECHNIQUE: Multidetector CT imaging of the chest was performed using the standard protocol during bolus administration of intravenous contrast. Multiplanar  CT image reconstructions and MIPs were obtained to evaluate the vascular anatomy.  CONTRAST:  161m OMNIPAQUE IOHEXOL 350 MG/ML SOLN  COMPARISON:  CT abdomen and pelvis 09/22/2014  FINDINGS: This is a satisfactory evaluation of the pulmonary arterial tree. No focal filling defects are identified in the pulmonary arteries.  There is a reactive-size prevascular mediastinal lymph node that measures 9.5 mm. Thoracic aorta is normal in caliber and enhancement. Negative for aortic dissection. Heart size is normal.  There is a moderate sized simple left pleural effusion. Negative for right pleural effusion or pericardial effusion. Esophagus is unremarkable.  There is stranding in the mediastinal fat to the left of the heart, at the cardiophrenic angle.  Lung windows demonstrate extensive multifocal pulmonary parenchymal airspace opacities, with areas of consolidation. Airspace  disease is seen with the upper lobes and both lower lobes. No definite right middle lobe airspace disease. There is architectural distortion and scarring in both upper lobes, this appears similar to chest radiograph of November 2013. Definite the etiology for prior scarring is uncertain.  There are bilateral subpectoral breast implants. Soft tissues of both breasts are symmetric. Negative for normal axillary lymphadenopathy.  No acute findings are seen in the imaged portion of the upper abdomen.  No acute osseous abnormality.  Review of the MIP images confirms the above findings.  IMPRESSION: 1. Bilateral multifocal, multilobar airspace disease is most consistent with pneumonia. Reactive sized pre-vascular lymph node. 2. Moderate-sized simple left pleural effusion. 3. Stranding in the epicardial fat pad. This finding is nonspecific, but has been reported in the setting of mediastinal fat necrosis, which can be reported in patients with pneumonia. 4. Architectural distortion/scarring in the upper lobes bilaterally. This appearance was present on a  chest radiograph of 2013. The cause for prior parenchymal pulmonary scarring is unknown. 5. Negative for pulmonary embolism.   Electronically Signed   By: Curlene Dolphin M.D.   On: 09/24/2014 16:46   Ct Abdomen Pelvis W Contrast  09/22/2014   CLINICAL DATA:  Fever, chills, nausea.  Crohn's disease.  EXAM: CT ABDOMEN AND PELVIS WITH CONTRAST  TECHNIQUE: Multidetector CT imaging of the abdomen and pelvis was performed using the standard protocol following bolus administration of intravenous contrast.  CONTRAST:  162m OMNIPAQUE IOHEXOL 300 MG/ML  SOLN  COMPARISON:  CT abdomen 11/04/2012  FINDINGS: Small to moderate left pleural effusion with mild left lower lobe atelectasis.  Liver gallbladder and bile ducts are normal. Pancreas and spleen are normal. Right adrenal lesion measures 18 x 28 mm and is stable from prior studies consistent with adenoma. This has diffusely low attenuation compared to the liver.  Thickened ileum is present in the pelvis. This is approximately 10 cm proximal to the ileal cecal valve. The terminal ileum is not involved. There is a 15 cm segment of thickened ileum present in the pelvis which has progressed since 2013. This is most consistent with diagnosis of Crohn's disease. This is not causing bowel obstruction. Colon not involved. Appendix normal.  Negative for abscess.  No free fluid or adenopathy.  IMPRESSION: Right adrenal adenoma stable  Thickened loop of ileum in the pelvis consist with Crohn's disease not causing obstruction. No abscess or free fluid. Progressive thickening of the ileum since 2013. The terminal ileum appears spared.   Electronically Signed   By: CFranchot GalloM.D.   On: 09/22/2014 13:12   Dg Chest Port 1 View  09/24/2014   CLINICAL DATA:  Evaluate pleural effusion.  Cough.  EXAM: PORTABLE CHEST - 1 VIEW  COMPARISON:  CT chest 09/24/2014.  Chest radiograph 11/04/2012  FINDINGS: There is a moderate size left pleural effusion. Patchy multifocal bilateral airspace  disease in the upper lung fields is noted. There is atelectasis at the left lung base. The heart and mediastinal contours appear stable compared to prior chest radiograph.  Pulmonary scarring/architectural distortion is noted in the upper lung fields, and chronic finding.  IMPRESSION: Bilateral airspace disease is most consistent with bilateral multifocal pneumonia.  Moderate size left pleural effusion, likely parapneumonic.   Electronically Signed   By: SCurlene DolphinM.D.   On: 09/24/2014 21:41   [place]oracentesis Asp Pleural Space W/img Guide  09/25/2014   INDICATION: Symptomatic left sided pleural effusion  EXAM: [place]ORACENTESIS ASP PLEURAL SPACE W/IMG GUIDE  COMPARISON:  None.  MEDICATIONS: None  COMPLICATIONS: None immediate  TECHNIQUE: Informed written consent was obtained from the patient after a discussion of the risks, benefits and alternatives to treatment. A timeout was performed prior to the initiation of the procedure.  Initial ultrasound scanning demonstrates a left pleural effusion. The lower chest was prepped and draped in the usual sterile fashion. 1% lidocaine was used for local anesthesia.  Under direct ultrasound guidance, a 19 gauge, 7-cm, Yueh catheter was introduced. An ultrasound image was saved for documentation purposes. The thoracentesis was performed. The catheter was removed and a dressing was applied. The patient tolerated the procedure well without immediate post procedural complication. The patient was escorted to have an upright chest radiograph.  FINDINGS: A total of approximately 1 liters of serous fluid was removed. Requested samples were sent to the laboratory.  IMPRESSION: Successful ultrasound-guided left sided thoracentesis yielding 1 liters of pleural fluid.  Read By:  Tsosie Billing PA-C   Electronically Signed   By: Aletta Edouard M.D.   On: 09/25/2014 10:29    Microbiology: Recent Results (from the past 240 hour(s))  CULTURE, BLOOD (ROUTINE X 2)     Status: None    Collection Time    09/24/14  4:58 PM      Result Value Ref Range Status   Specimen Description BLOOD LEFT ANTECUBITAL   Final   Special Requests BOTTLES DRAWN AEROBIC AND ANAEROBIC LAC   Final   Culture  Setup Time     Final   Value: 09/24/2014 23:04     Performed at [facility]   Culture     Final   Value:        BLOOD CULTURE RECEIVED NO GROWTH TO DATE CULTURE WILL BE HELD FOR 5 DAYS BEFORE ISSUING A FINAL NEGATIVE REPORT     Performed at [facility]   Report Status PENDING   Incomplete  CULTURE, BLOOD (ROUTINE X 2)     Status: None   Collection Time    09/24/14  5:03 PM      Result Value Ref Range Status   Specimen Description BLOOD RIGHT ANTECUBITAL   Final   Special Requests BOTTLES DRAWN AEROBIC AND ANAEROBIC 5 CC   Final   Culture  Setup Time     Final   Value: 09/24/2014 22:50     Performed at [facility]   Culture     Final   Value:        BLOOD CULTURE RECEIVED NO GROWTH TO DATE CULTURE WILL BE HELD FOR 5 DAYS BEFORE ISSUING A FINAL NEGATIVE REPORT     Performed at [facility]   Report Status PENDING   Incomplete  CULTURE, EXPECTORATED SPUTUM-ASSESSMENT     Status: None   Collection Time    09/25/14  6:37 AM      Result Value Ref Range Status   Specimen Description SPUTUM   Final   Special Requests Immunocompromised   Final   Sputum evaluation     Final   Value: MICROSCOPIC FINDINGS SUGGEST THAT THIS SPECIMEN IS NOT REPRESENTATIVE OF LOWER RESPIRATORY SECRETIONS. PLEASE RECOLLECT.     NOTIFIED RN AT 0700 ON 10.9.15 BY SHUEA   Report Status 09/25/2014 FINAL   Final  CLOSTRIDIUM DIFFICILE BY PCR     Status: None   Collection Time    09/25/14  8:50 AM      Result Value Ref Range Status   C difficile by pcr NEGATIVE  NEGATIVE Final   Comment: Performed at [facility]  BODY FLUID CULTURE     Status: None   Collection Time    09/25/14  9:11 AM      Result Value Ref Range Status   Specimen Description PLEURAL LEFT   Final    Special Requests NONE   Final   Gram Stain     Final   Value: RARE WBC PRESENT, PREDOMINANTLY MONONUCLEAR     NO ORGANISMS SEEN     Performed at [facility]   Culture     Final   Value: NO GROWTH 3 DAYS     Performed at [facility]   Report Status 09/28/2014 FINAL   Final  BODY FLUID CULTURE     Status: None   Collection Time    09/28/14  2:05 PM      Result Value Ref Range Status   Specimen Description PERITONEAL CAVITY   Final   Special Requests Normal   Final   Gram Stain     Final   Value: RARE WBC PRESENT, PREDOMINANTLY MONONUCLEAR     NO ORGANISMS SEEN     Performed at [facility]   Culture     Final   Value: NO GROWTH 1 DAY     Performed at [facility]   Report Status PENDING   Incomplete     Labs: Basic Metabolic Panel:  Recent Labs Lab 09/24/14 1126 09/24/14 1844 09/25/14 0440 09/26/14 0903 09/27/14 0500 09/28/14 0415  NA 137  --  132* 133* 136* 134*  K 3.9  --  3.6* 3.5* 3.0* 4.4  CL 98  --  98 97 102 100  CO2 24  --  20 22 23 21   GLUCOSE 111*  --  138* 119* 118* 88  BUN 18  --  6 3* 4* 4*  CREATININE 0.70  --  0.61 0.67 0.66 0.65  CALCIUM 9.3  --  8.2* 8.4 8.3* 8.3*  MG  --  2.0  --   --   --   --   PHOS  --  3.8  --   --   --   --    Liver Function Tests:  Recent Labs Lab 09/24/14 1126 09/28/14 0415  AST 23  --   ALT 25  --   ALKPHOS 65  --   BILITOT 0.4  --   PROT 8.3 6.4  ALBUMIN 3.5  --    No results found for this basename: LIPASE, AMYLASE,  in the last 168 hours No results found for this basename: AMMONIA,  in the last 168 hours CBC:  Recent Labs Lab 09/24/14 1126 09/25/14 0440 09/26/14 0903 09/27/14 0500 09/28/14 0415  WBC 12.7* 9.7 9.1 5.9 7.7  NEUTROABS 10.3* 7.2 6.5  --   --   HGB 13.6 11.4* 11.9* 10.7* 10.8*  HCT 39.8 34.5* 35.6* 31.3* 31.6*  MCV 84.3 84.4 84.4 82.8 82.5  PLT 360 343 326 263 336   Cardiac Enzymes: No results found for this basename: CKTOTAL, CKMB, CKMBINDEX,  TROPONINI,  in the last 168 hours BNP: BNP (last 3 results) No results found for this basename: PROBNP,  in the last 8760 hours CBG: No results found for this basename: GLUCAP,  in the last 168 hours     Signed:  Alford Gamero S  [facility] 09/29/2014, 12:14 PM

## 2014-09-30 LAB — CULTURE, BLOOD (ROUTINE X 2)
Culture: NO GROWTH
Culture: NO GROWTH

## 2014-09-30 NOTE — Telephone Encounter (Signed)
Patient recently hospitalized with fever pneumonia and parapneumonic effusion Given an infectious process requiring hospitalization I would not give Remicade again until she follows up with me in the office Office followup should be scheduled within 3 weeks

## 2014-09-30 NOTE — Telephone Encounter (Signed)
Dr Pyrtle, FYI.... 

## 2014-09-30 NOTE — Telephone Encounter (Signed)
I have spoken to patient. She will come for office visit no 10/08/14 @ 11:30 am.

## 2014-09-30 NOTE — Telephone Encounter (Signed)
Per Thea Gist @ [employer], patient's Remicade has been approved. PA# [id number].

## 2014-09-30 NOTE — Telephone Encounter (Signed)
Dr. Hilarie Fredrickson do you want this pt scheduled with a midlevel? You are not in the office the first 2 weeks of November.

## 2014-09-30 NOTE — Telephone Encounter (Signed)
Ok to [place] 10/08/14 at 11:30 am Block an canceled slots on this day should it occur Thanks

## 2014-10-01 ENCOUNTER — Ambulatory Visit: Payer: BC Managed Care – PPO | Admitting: Internal Medicine

## 2014-10-01 LAB — BODY FLUID CULTURE
Culture: NO GROWTH
Special Requests: NORMAL

## 2014-10-02 ENCOUNTER — Other Ambulatory Visit (INDEPENDENT_AMBULATORY_CARE_PROVIDER_SITE_OTHER): Payer: BC Managed Care – PPO

## 2014-10-02 ENCOUNTER — Encounter (HOSPITAL_COMMUNITY)
Admission: RE | Admit: 2014-10-02 | Payer: BC Managed Care – PPO | Source: Ambulatory Visit | Attending: Internal Medicine | Admitting: Internal Medicine

## 2014-10-02 ENCOUNTER — Ambulatory Visit (INDEPENDENT_AMBULATORY_CARE_PROVIDER_SITE_OTHER): Payer: BC Managed Care – PPO | Admitting: Internal Medicine

## 2014-10-02 ENCOUNTER — Encounter: Payer: Self-pay | Admitting: *Deleted

## 2014-10-02 ENCOUNTER — Encounter: Payer: Self-pay | Admitting: Internal Medicine

## 2014-10-02 ENCOUNTER — Ambulatory Visit (INDEPENDENT_AMBULATORY_CARE_PROVIDER_SITE_OTHER)
Admission: RE | Admit: 2014-10-02 | Discharge: 2014-10-02 | Disposition: A | Discharge status: Home / Self Care | Payer: BC Managed Care – PPO | Source: Ambulatory Visit | Attending: Internal Medicine | Admitting: Internal Medicine

## 2014-10-02 VITALS — BP 108/70 | HR 128 | Ht 68.0 in | Wt 143.4 lb

## 2014-10-02 DIAGNOSIS — R5381 Other malaise: Secondary | ICD-10-CM

## 2014-10-02 DIAGNOSIS — R000 Tachycardia, unspecified: Secondary | ICD-10-CM

## 2014-10-02 DIAGNOSIS — J189 Pneumonia, unspecified organism: Secondary | ICD-10-CM

## 2014-10-02 LAB — PHOSPHORUS: Phosphorus: 2.9 mg/dL (ref 2.3–4.6)

## 2014-10-02 LAB — BASIC METABOLIC PANEL
BUN: 7 mg/dL (ref 6–23)
CO2: 19 mEq/L (ref 19–32)
Calcium: 9 mg/dL (ref 8.4–10.5)
Chloride: 103 mEq/L (ref 96–112)
Creatinine, Ser: 0.7 mg/dL (ref 0.4–1.2)
GFR: 102.05 mL/min (ref >60.00)
GLUCOSE: 116 mg/dL — AB (ref 70–99)
POTASSIUM: 3.7 meq/L (ref 3.5–5.1)
Sodium: 134 mEq/L — ABNORMAL LOW (ref 135–145)

## 2014-10-02 LAB — MAGNESIUM: Magnesium: 1.8 mg/dL (ref 1.5–2.5)

## 2014-10-02 NOTE — Progress Notes (Signed)
Subjective:    Patient ID: Meghan Charles, female    DOB: 07-30-1971, 43 y.o.   MRN: [id number]  HPI    OV 10/02/2014  Chief Complaint  Patient presents with  . HFU    Pt c/o having increased fatigue since discharge. She also c/o having increased SOB.    43 year old female previously healthy but then was diagnosed with Crohn's disease in June 2015. In September 2000 and received her first dose of Remicade. Then on 09/24/2014 when she was supposed to get a second dose of Remicade [name] up in the hospital with bilateral pneumonia and associated left-sided exudative pleural effusion. Cultures have been negative. She was treated for a bacterial process. She was discharged 3 days ago on 09/29/2014. Since discharge she has been on Levaquin 750 mg daily for 8 days with 5 more days to go. She says that she got any worse but overall is extremely fatigued if she does any activities of daily living. Denies any fever, cough, rhinitis, chills, chest pain, wheeze, hemoptysis.  On exam today given that this is significantly tachycardic with a resting heart rate of 130 per minute. Walking desat  After 3 laps 185 feeet - HR rose to 150/min. Pulse ox lowest was 93%-94%  - EKG - sinus tach at rest, HR 130. Nil acute   Dg Chest 2 View  10/02/2014   CLINICAL DATA:  Follow-up community-acquired pneumonia. Shortness of breath and nonproductive cough for 1 week.  EXAM: CHEST  2 VIEW  COMPARISON:  09/29/2014  FINDINGS: The cardiomediastinal silhouette is unchanged and within normal limits. Patchy airspace opacities throughout the left greater than right lungs overall do not appear significantly changed. Rounded pleural density posteriorly on the lateral radiograph is unchanged and may reflect a small loculated left-sided pleural effusion. No pneumothorax is identified. No acute osseous abnormality is identified.  IMPRESSION: Unchanged appearance of bilateral infiltrates and small left pleural effusion.    Electronically Signed   By: Logan Bores   On: 10/02/2014 15:13    PULMONARY No results found for this basename: PHART, PCO2, PCO2ART, PO2, PO2ART, HCO3, TCO2, O2SAT,  in the last 168 hours  CBC  Recent Labs Lab 09/26/14 0903 09/27/14 0500 09/28/14 0415  HGB 11.9* 10.7* 10.8*  HCT 35.6* 31.3* 31.6*  WBC 9.1 5.9 7.7  PLT 326 263 336    COAGULATION No results found for this basename: INR,  in the last 168 hours  CARDIAC  No results found for this basename: TROPONINI,  in the last 168 hours No results found for this basename: PROBNP,  in the last 168 hours   CHEMISTRY  Recent Labs Lab 09/26/14 0903 09/27/14 0500 09/28/14 0415  NA 133* 136* 134*  K 3.5* 3.0* 4.4  CL 97 102 100  CO2 22 23 21   GLUCOSE 119* 118* 88  BUN 3* 4* 4*  CREATININE 0.67 0.66 0.65  CALCIUM 8.4 8.3* 8.3*   Estimated Creatinine Clearance: 91.5 ml/min (by C-G formula based on Cr of 0.65).   LIVER  Recent Labs Lab 09/28/14 0415  PROT 6.4     INFECTIOUS No results found for this basename: LATICACIDVEN, PROCALCITON,  in the last 168 hours   ENDOCRINE CBG (last 3)  No results found for this basename: GLUCAP,  in the last 72 hours       IMAGING x48h Dg Chest 2 View  10/02/2014   CLINICAL DATA:  Follow-up community-acquired pneumonia. Shortness of breath and nonproductive cough for 1 week.  EXAM: CHEST  2 VIEW  COMPARISON:  09/29/2014  FINDINGS: The cardiomediastinal silhouette is unchanged and within normal limits. Patchy airspace opacities throughout the left greater than right lungs overall do not appear significantly changed. Rounded pleural density posteriorly on the lateral radiograph is unchanged and may reflect a small loculated left-sided pleural effusion. No pneumothorax is identified. No acute osseous abnormality is identified.  IMPRESSION: Unchanged appearance of bilateral infiltrates and small left pleural effusion.   Electronically Signed   By: Logan Bores   On: 10/02/2014  15:13        has a past medical history of Gallbladder polyp; Anxiety; Colon polyps (05/2014); and Crohn's disease.   has past surgical history that includes Other surgical history; wisdom teeth extracted; Right Heel surgery; and Colonoscopy w/ biopsies (2015).    reports that she has never smoked. She has never used smokeless tobacco.  Immunization History  Administered Date(s) Administered  . Hep A / Hep B 07/06/2014, 08/06/2014  . Pneumococcal Polysaccharide-23 07/01/2014    No Known Allergies  History   Social History  . Marital Status: Single    Spouse Name: N/A    Number of Children: N/A  . Years of Education: N/A   Occupational History  . Recruitment consultant    Social History Main Topics  . Smoking status: Never Smoker   . Smokeless tobacco: Never Used  . Alcohol Use: Yes     Comment: 3 times per week  . Drug Use: No  . Sexual Activity: No   Other Topics Concern  . Not on file   Social History Narrative  . No narrative on file     Current outpatient prescriptions:ALPRAZolam (XANAX) 0.5 MG tablet, Take 1 tablet (0.5 mg total) by mouth at bedtime as needed for anxiety or sleep., Disp: 10 tablet, Rfl: 0;  calcium carbonate 1250 MG capsule, Take 1,250 mg by mouth daily., Disp: , Rfl: ;  Cholecalciferol (VITAMIN D) 1000 UNITS capsule, Take 1,000 Units by mouth daily., Disp: , Rfl:  guaiFENesin (MUCINEX) 600 MG 12 hr tablet, Take 2 tablets (1,200 mg total) by mouth 2 (two) times daily., Disp: 30 tablet, Rfl: 0;  levofloxacin (LEVAQUIN) 750 MG tablet, Take 1 tablet (750 mg total) by mouth daily., Disp: 8 tablet, Rfl: 0;  metoprolol tartrate (LOPRESSOR) 25 MG tablet, Take 1 tablet (25 mg total) by mouth 2 (two) times daily., Disp: 60 tablet, Rfl: 0 ondansetron (ZOFRAN) 4 MG tablet, Take 1 tablet (4 mg total) by mouth every 6 (six) hours as needed for nausea or vomiting., Disp: 30 tablet, Rfl: 0;  OVER THE COUNTER MEDICATION, Place 2 drops into both eyes daily as needed  (dry eyes). [employer] brand eye drops for red/itchy eyes., Disp: , Rfl: ;  oxyCODONE-acetaminophen (ROXICET) 5-325 MG per tablet, Take 1 tablet by mouth every 8 (eight) hours as needed for severe pain., Disp: 30 tablet, Rfl: 0 predniSONE (DELTASONE) 5 MG tablet, Take 5 mg by mouth daily with breakfast., Disp: , Rfl: ;  inFLIXimab (REMICADE) 100 MG injection, Inject into the vein., Disp: , Rfl:    Review of Systems  Constitutional: Positive for fatigue. Negative for fever and unexpected weight change.  HENT: Negative for congestion, dental problem, ear pain, nosebleeds, postnasal drip, rhinorrhea, sinus pressure, sneezing, sore throat and trouble swallowing.   Eyes: Negative for redness and itching.  Respiratory: Positive for cough and shortness of breath. Negative for chest tightness and wheezing.   Cardiovascular: Negative for palpitations and leg swelling.  Gastrointestinal: Negative for nausea  and vomiting.  Genitourinary: Negative for dysuria.  Musculoskeletal: Negative for joint swelling.  Skin: Negative for rash.  Neurological: Negative for headaches.  Hematological: Does not bruise/bleed easily.  Psychiatric/Behavioral: Negative for dysphoric mood. The patient is not nervous/anxious.        Objective:   Physical Exam  Vitals reviewed. Constitutional: She is oriented to person, place, and time. She appears well-developed and well-nourished. No distress.  Looks some deconditioned Lying in bed in exam room  HENT:  Head: Normocephalic and atraumatic.  Right Ear: External ear normal.  Left Ear: External ear normal.  Mouth/Throat: Oropharynx is clear and moist. No oropharyngeal exudate.  Eyes: Conjunctivae and EOM are normal. Pupils are equal, round, and reactive to light. Right eye exhibits no discharge. Left eye exhibits no discharge. No scleral icterus.  Neck: Normal range of motion. Neck supple. No JVD present. No tracheal deviation present. No thyromegaly present.    Cardiovascular: Normal rate, regular rhythm, normal heart sounds and intact distal pulses.  Exam reveals no gallop and no friction rub.   No murmur heard. Pulmonary/Chest: Effort normal and breath sounds normal. No respiratory distress. She has no wheezes. She has no rales. She exhibits no tenderness.  Mild diminished air entry left base  Abdominal: Soft. Bowel sounds are normal. She exhibits no distension and no mass. There is no tenderness. There is no rebound and no guarding.  Musculoskeletal: Normal range of motion. She exhibits no edema and no tenderness.  Lymphadenopathy:    She has no cervical adenopathy.  Neurological: She is alert and oriented to person, place, and time. She has normal reflexes. No cranial nerve deficit. She exhibits normal muscle tone. Coordination normal.  Skin: Skin is warm and dry. No rash noted. She is not diaphoretic. No erythema. No pallor.  Scattered tatoos +  Psychiatric: She has a normal mood and affect. Her behavior is normal. Judgment and thought content normal.    Filed Vitals:   10/02/14 1525  BP: 108/70  Pulse: 128  Height: 5' 8"  (1.727 m)  Weight: 143 lb 6.4 oz (65.046 kg)  SpO2: 96%         Assessment & Plan:  ##Pneumonia #Physical Deconditoning #Fast Heart Rate   - Cxr still shows infilrates which is to be expected  3 days after discharge but if this worsens or does nto resolve will need repeat ct ches +/- bronch bal for opportunistic infections  - You could still be dehydrated based on fatigue and fast heart rate - Alternatively this could all be post pneumonia physical deconditioning  - Other possibility is electrolyte abnormalities  Recommend  -  stay off work for next 10 days  - do blood work; will call with results  Followup  - no remicade unless cleared by [place]  - return to see my NP Tammy Parrett in one week - go to ER sooner if worse     Dr. Brand Males, M.D., [facility]C.P Pulmonary and Critical Care Medicine Staff  Physician [place] [facility] Pager: [phone], If no answer or between  15:00h - 7:00h: call [phone]  10/02/2014 4:53 PM

## 2014-10-02 NOTE — Patient Instructions (Signed)
#  Pneumonia #Physical Deconditoning #Fast Heart Rate   - Cxr still shows infilrates which is to be expected  3 days after discharge  - You could still be dehydrated based on fatigue and fast heart rate - Alternatively this could all be post pneumonia physical deconditioning  - Other possibility is electrolyte abnormalities  Recommend  -  stay off work for next 10 days  - do blood work; will call with results  Followup  - no remicade unless cleared by [place]  - return to see my NP Tammy Parrett in one week - go to ER sooner if worse

## 2014-10-03 ENCOUNTER — Telehealth: Payer: Self-pay | Admitting: Internal Medicine

## 2014-10-03 NOTE — Telephone Encounter (Addendum)
  Recent Labs Lab 09/27/14 0500 09/28/14 0415 10/02/14 1644  NA 136* 134* 134*  K 3.0* 4.4 3.7  CL 102 100 103  CO2 23 21 19   GLUCOSE 118* 88 116*  BUN 4* 4* 7  CREATININE 0.66 0.65 0.7  CALCIUM 8.3* 8.3* 9.0  MG  --   --  1.8  PHOS  --   --  2.9    Labs stable - mild low Na. Will need repeat CBC, BMEt, phos, mag when she comes back to see Tammy. Want her to continue to drink fluids like low sugar Gatorade (not just water)  Called to give message to patient but LMTCB to answering service. She had difficulty calling me and finally spoke to me later in day: advised as above  Will forwared message to [facility] as fyi - you see her 10/08/14; no need for Tammy parrett tor reply  Dr. Brand Males, M.D., [facility].C.P Pulmonary and Critical Care Medicine Staff Physician [place] [facility] Pager: [phone], If no answer or between  15:00h - 7:00h: call [phone]  10/03/2014 11:29 AM

## 2014-10-08 ENCOUNTER — Encounter: Payer: Self-pay | Admitting: Internal Medicine

## 2014-10-08 ENCOUNTER — Ambulatory Visit (INDEPENDENT_AMBULATORY_CARE_PROVIDER_SITE_OTHER): Payer: BC Managed Care – PPO | Admitting: Internal Medicine

## 2014-10-08 ENCOUNTER — Encounter: Payer: Self-pay | Admitting: Adult Health

## 2014-10-08 ENCOUNTER — Ambulatory Visit (INDEPENDENT_AMBULATORY_CARE_PROVIDER_SITE_OTHER)
Admission: RE | Admit: 2014-10-08 | Discharge: 2014-10-08 | Disposition: A | Discharge status: Home / Self Care | Payer: BC Managed Care – PPO | Source: Ambulatory Visit | Attending: Adult Health | Admitting: Adult Health

## 2014-10-08 ENCOUNTER — Ambulatory Visit (INDEPENDENT_AMBULATORY_CARE_PROVIDER_SITE_OTHER): Payer: BC Managed Care – PPO | Admitting: Adult Health

## 2014-10-08 ENCOUNTER — Other Ambulatory Visit (INDEPENDENT_AMBULATORY_CARE_PROVIDER_SITE_OTHER): Payer: BC Managed Care – PPO

## 2014-10-08 VITALS — BP 106/64 | HR 123 | Temp 97.2°F | Ht 68.0 in | Wt 138.4 lb

## 2014-10-08 VITALS — BP 90/60 | HR 76 | Ht 68.0 in | Wt 136.2 lb

## 2014-10-08 DIAGNOSIS — J189 Pneumonia, unspecified organism: Secondary | ICD-10-CM

## 2014-10-08 DIAGNOSIS — R000 Tachycardia, unspecified: Secondary | ICD-10-CM

## 2014-10-08 DIAGNOSIS — Z79899 Other long term (current) drug therapy: Secondary | ICD-10-CM

## 2014-10-08 DIAGNOSIS — K50918 Crohn's disease, unspecified, with other complication: Secondary | ICD-10-CM

## 2014-10-08 MED ORDER — ONDANSETRON HCL 4 MG PO TABS: 4.0000 mg | ORAL_TABLET | Freq: Four times a day (QID) | ORAL | Status: DC | PRN

## 2014-10-08 MED ORDER — BUDESONIDE 3 MG PO CP24: 9.0000 mg | ORAL_CAPSULE | Freq: Every day | ORAL | Status: DC

## 2014-10-08 NOTE — Patient Instructions (Signed)
Remain off Remicade for now  Continue on current regimen  Follow up Dr. Chase Caller in 2 -3 weeks with chest xray  Please contact office for sooner follow up if symptoms do not improve or worsen or seek emergency care

## 2014-10-08 NOTE — Progress Notes (Signed)
   Subjective:    Patient ID: Meghan Charles, female    DOB: 03-01-1971, 43 y.o.   MRN: [id number]  HPI    OV 10/02/2014  Chief Complaint  Patient presents with  . HFU    Pt c/o having increased fatigue since discharge. She also c/o having increased SOB.    43 year old female previously healthy but then was diagnosed with Crohn's disease in June 2015. In September 2000 and received her first dose of Remicade. Then on 09/24/2014 when she was supposed to get a second dose of Remicade Meghan Charles up in the hospital with bilateral pneumonia and associated left-sided exudative pleural effusion. Cultures have been negative. She was treated for a bacterial process. She was discharged 3 days ago on 09/29/2014. Since discharge she has been on Levaquin 750 mg daily for 8 days with 5 more days to go. She says that she got any worse but overall is extremely fatigued if she does any activities of daily living. Denies any fever, cough, rhinitis, chills, chest pain, wheeze, hemoptysis.  On exam today given that this is significantly tachycardic with a resting heart rate of 130 per minute. Walking desat  After 3 laps 185 feeet - HR rose to 150/min. Pulse ox lowest was 93%-94%  - EKG - sinus tach at rest, HR 130. Nil acute         10/08/2014 Follow up PNA  Returns for 1 week follow up for PNA . Reports symptoms have improved about 70% since last ov.  no new complaints - does report some fever up to 101 on 10/19 but resolved with no return.  Cough is improving but still weak.  Seen by GI this am, Remicade is on hold d/t recent PNA. Would like to restart Remicade rather than  surgical intervention for Crohn's enteritis . She was changed from prednisone to Entocort 9mg  daily .  CXR today shows no change in bilateral infiltrates    Review of Systems  Constitutional: Positive for fatigue. Negative for fever and unexpected weight change.  HENT: Negative for congestion, dental problem, ear pain, nosebleeds,  postnasal drip, rhinorrhea, sinus pressure, sneezing, sore throat and trouble swallowing.   Eyes: Negative for redness and itching.  Respiratory: Positive for cough and shortness of breath. Negative for chest tightness and wheezing.   Cardiovascular: Negative for palpitations and leg swelling.  Gastrointestinal: Negative for nausea and vomiting.  Genitourinary: Negative for dysuria.  Musculoskeletal: Negative for joint swelling.  Skin: Negative for rash.  Neurological: Negative for headaches.  Hematological: Does not bruise/bleed easily.  Psychiatric/Behavioral: Negative for dysphoric mood. The patient is not nervous/anxious.        Objective:   Physical Exam GEN: A/Ox3; pleasant , NAD, well nourished   HEENT:  Meghan Charles/AT,  EACs-clear, TMs-wnl, NOSE-clear, THROAT-clear, no lesions, no postnasal drip or exudate noted.   NECK:  Supple w/ fair ROM; no JVD; normal carotid impulses w/o bruits; no thyromegaly or nodules palpated; no lymphadenopathy.  RESP  Clear  P & A; w/o, wheezes/ rales/ or rhonchi.no accessory muscle use, no dullness to percussion  CARD:  RRR, no m/r/g  , no peripheral edema, pulses intact, no cyanosis or clubbing.  GI:   Soft & nt; nml bowel sounds; no organomegaly or masses detected.  Musco: Warm bil, no deformities or joint swelling noted.   Neuro: alert, no focal deficits noted.    Skin: Warm, no lesions or rashes         Assessment & Plan:

## 2014-10-08 NOTE — Patient Instructions (Addendum)
We have sent in Entocort to your pharmacy to replace the Prednisone if approved by your insurance We will call you back to discuss Remicade treatment renewal   We refilled your Zofran  Follow up in 6 weeks (Keep your scheduled appt on 11/23)

## 2014-10-08 NOTE — Progress Notes (Signed)
Subjective:    Patient ID: Meghan Charles, female    DOB: 1971-11-18, 43 y.o.   MRN: [id number]  HPI Meghan Charles is a 43 yo female with Crohn's enteritis, recent hospitalization with b/l PNA with parapneumonic effusion requiring left thoracentesis x2 who is seen for followup. She was last seen by me on 09/01/2014 and then had her first dose of Remicade induction on 09/03/2014. This will well and without event. She was scheduled for a second dose on 09/22/2014 but this was delayed due to issues with the medication delivery. She could not get a second dose because she became ill. She developed fever at home on a Saturday with a mild cough which resolved. Then on Tuesday recurrent fever seen in the ER and treated with azithromycin. 2 days after that recurrent fever and chest pain along with vomiting. Had a CT scan of the chest performed which showed bilateral pneumonia with effusion. She was admitted to the hospital and required oxygen and IV antibiotics. She was seen by pulmonary and GI. She was treated with initially broad-spectrum antibiotics then changed to levofloxacin which she recently completed. Overall she reports she is feeling better. She lost about 15 pounds but has had no recurrent fever or chills. No dyspnea. She has been out of work and staying down at her parents Meghan Charles in [place]. Still mild left chest discomfort. She was also started on metoprolol in the hospital due to persistent tachycardia. The tachycardia seems to have resolved. She denies abdominal complaints including nausea or vomiting. Her oral intake has been down because of a decreased appetite but not due to pain. She is starting to eat again she says. Bowel movements have been loose but not bloody or melenic. She has remained on prednisone 5 mg daily.   Review of Systems As per history of present illness, otherwise negative  Current Medications, Allergies, Past Medical History, Past Surgical History, Family History and  Social History were reviewed in [employer] record.     Objective:   Physical Exam BP 90/60  Pulse 76  Ht 5\' 8"  (1.727 m)  Wt 136 lb 4 oz (61.803 kg)  BMI 20.72 kg/m2 Constitutional: Well-developed though thinner and well-nourished. No distress. HEENT: Normocephalic and atraumatic. Oropharynx is clear and moist. No oropharyngeal exudate. Conjunctivae are normal.  No scleral icterus. Neck: Neck supple. Trachea midline. Cardiovascular: Normal rate, regular rhythm and intact distal pulses. No M/R/G Pulmonary/chest: Effort normal and decreased breath sounds left base, no wheezing rales or rhonchi Abdominal: Soft, nontender, nondistended. Bowel sounds active throughout. There are no masses palpable. No hepatosplenomegaly. Extremities: no clubbing, cyanosis, or edema Lymphadenopathy: No cervical adenopathy oted.n Neurological: Alert and oriented to person place and time. Skin: Skin is warm and dry. No rashes noted. Psychiatric: Normal mood and affect. Behavior is normal.  CBC    Component Value Date/Time   WBC 7.7 09/28/2014 0415   RBC 3.83* 09/28/2014 0415   RBC 3.91 11/05/2012 0837   HGB 10.8* 09/28/2014 0415   HCT 31.6* 09/28/2014 0415   PLT 336 09/28/2014 0415   MCV 82.5 09/28/2014 0415   MCH 28.2 09/28/2014 0415   MCHC 34.2 09/28/2014 0415   RDW 13.6 09/28/2014 0415   LYMPHSABS 1.2 09/26/2014 0903   MONOABS 1.4* 09/26/2014 0903   EOSABS 0.0 09/26/2014 0903   BASOSABS 0.0 09/26/2014 0903    CMP     Component Value Date/Time   NA 134* 10/02/2014 1644   K 3.7 10/02/2014 1644  CL 103 10/02/2014 1644   CO2 19 10/02/2014 1644   GLUCOSE 116* 10/02/2014 1644   BUN 7 10/02/2014 1644   CREATININE 0.7 10/02/2014 1644   CALCIUM 9.0 10/02/2014 1644   PROT 6.4 09/28/2014 0415   ALBUMIN 3.5 09/24/2014 1126   AST 23 09/24/2014 1126   ALT 25 09/24/2014 1126   ALKPHOS 65 09/24/2014 1126   BILITOT 0.4 09/24/2014 1126   GFRNONAA >90 09/28/2014 0415   GFRAA >90  09/28/2014 0415    CT scan of the chest abdomen and pelvis performed on 09/24/2014 reviewed including images     Assessment & Plan:  43 yo female with Crohn's enteritis, recent hospitalization with b/l PNA with parapneumonic effusion requiring left thoracentesis x2 who is seen for followup.  1. bilateral pneumonia with parapneumonic effusion -- greatly appreciated pulmonary care and assistance. She is now off of antibiotics. We had a long discussion regarding her Crohn's disease and Remicade. Remicade certainly increase his risk for infection and likely played at least a part in her pneumonia. Difficult to know exactly what she understands. She has pulmonary followup today. Would not give Remicade again until cleared by pulmonary, see #2. She will continue to use her incentive spirometry   2. Crohn's enteritis -- seen by capsule, seen by recent CT. Improved with prednisone taper though unfortunately she has remained on low-dose prednisone because we have been unable to complete Remicade induction. See #1. Long discussion today regarding Crohn's management including retrial of infliximab therapy versus surgery. We have discussed how surgical resection of active Crohn's disease would help in the short term but does not prevent recurrence of this chronic condition. We also discussed how studies have proven that reinitiation of biologic therapy early after surgical resection leads to overall lower risk of recurrence. We would still have the question of biologic therapy even if surgery was performed. I also would like to avoid surgery if possible because I do not know how much bowel would need to be resected. I think with new insurance budesonide is a better option for her Crohn's disease and associated with less systemic side effects than prednisone. We will discontinue prednisone in favor of Entocort 9 mg daily. I'll see her back in 4-6 weeks and discuss with pulmonary the notion of restarting Remicade.  Meghan Charles made it very clear that she would prefer to try Remicade again and give Remicade a chance rather than proceed to surgery at this time. We also again discussed the risks and benefits of infliximab therapy including infection.  3. Tachycardia -- resolved and likely result of infection and fevers. I will expect metoprolol can be stopped but will defer this to pulmonary.

## 2014-10-09 LAB — BASIC METABOLIC PANEL
BUN: 12 mg/dL (ref 6–23)
CHLORIDE: 102 meq/L (ref 96–112)
CO2: 24 mEq/L (ref 19–32)
CREATININE: 0.7 mg/dL (ref 0.4–1.2)
Calcium: 9.4 mg/dL (ref 8.4–10.5)
GFR: 93.91 mL/min (ref >60.00)
GLUCOSE: 84 mg/dL (ref 70–99)
Potassium: 4.4 mEq/L (ref 3.5–5.1)
Sodium: 136 mEq/L (ref 135–145)

## 2014-10-09 LAB — CBC WITH DIFFERENTIAL/PLATELET
BASOS PCT: 0.9 % (ref 0.0–3.0)
Basophils Absolute: 0.1 10*3/uL (ref 0.0–0.1)
EOS ABS: 0.1 10*3/uL (ref 0.0–0.7)
EOS PCT: 0.9 % (ref 0.0–5.0)
HCT: 37.8 % (ref 36.0–46.0)
HEMOGLOBIN: 12.4 g/dL (ref 12.0–15.0)
LYMPHS PCT: 14.2 % (ref 12.0–46.0)
Lymphs Abs: 1.5 10*3/uL (ref 0.7–4.0)
MCHC: 32.8 g/dL (ref 30.0–36.0)
MCV: 83.5 fl (ref 78.0–100.0)
Monocytes Absolute: 0.4 10*3/uL (ref 0.1–1.0)
Monocytes Relative: 3.5 % (ref 3.0–12.0)
Neutro Abs: 8.3 10*3/uL — ABNORMAL HIGH (ref 1.4–7.7)
Neutrophils Relative %: 80.5 % — ABNORMAL HIGH (ref 43.0–77.0)
Platelets: 658 10*3/uL — ABNORMAL HIGH (ref 150.0–400.0)
RBC: 4.52 Mil/uL (ref 3.87–5.11)
RDW: 14.3 % (ref 11.5–15.5)
WBC: 10.3 10*3/uL (ref 4.0–10.5)

## 2014-10-09 LAB — PHOSPHORUS: PHOSPHORUS: 3.6 mg/dL (ref 2.3–4.6)

## 2014-10-09 LAB — MAGNESIUM: Magnesium: 2.3 mg/dL (ref 1.5–2.5)

## 2014-10-12 NOTE — Assessment & Plan Note (Addendum)
Bilateral PNA in setting of immunosuppressed pt previously on Remicade.  Check labs today  Advised to advance activity as tolerated.   Plan  Remain off Remicade for now  Continue on current regimen  Follow up Dr. Chase Charles in 2 -3 weeks with chest xray  Please contact office for sooner follow up if symptoms do not improve or worsen or seek emergency care

## 2014-10-15 ENCOUNTER — Telehealth: Payer: Self-pay

## 2014-10-15 NOTE — Telephone Encounter (Signed)
Remicade appointments cancelled for now.

## 2014-10-15 NOTE — Telephone Encounter (Signed)
Yes cancel remicade infusion for now, will likely resume but after cleared by pulm

## 2014-10-15 NOTE — Telephone Encounter (Signed)
The Short Stay at [facility] long has the patient on their schedule for a Remicade infusion on 10/20/14. The office note of 10/08/14 for [place] ad Pulmonary indicate she is to stay off the Remicade for now. Please confirm this is correct and I will notify Short Stay at the hospital.

## 2014-10-20 ENCOUNTER — Inpatient Hospital Stay (HOSPITAL_COMMUNITY): Admission: RE | Admit: 2014-10-20 | Payer: BC Managed Care – PPO | Source: Ambulatory Visit

## 2014-10-28 ENCOUNTER — Ambulatory Visit: Payer: BC Managed Care – PPO | Admitting: Internal Medicine

## 2014-10-29 ENCOUNTER — Ambulatory Visit: Payer: BC Managed Care – PPO | Admitting: Adult Health

## 2014-11-03 ENCOUNTER — Encounter: Payer: Self-pay | Admitting: Adult Health

## 2014-11-03 ENCOUNTER — Ambulatory Visit (INDEPENDENT_AMBULATORY_CARE_PROVIDER_SITE_OTHER)
Admission: RE | Admit: 2014-11-03 | Discharge: 2014-11-03 | Disposition: A | Discharge status: Home / Self Care | Payer: BC Managed Care – PPO | Source: Ambulatory Visit | Attending: Adult Health | Admitting: Adult Health

## 2014-11-03 ENCOUNTER — Other Ambulatory Visit (INDEPENDENT_AMBULATORY_CARE_PROVIDER_SITE_OTHER): Payer: BC Managed Care – PPO

## 2014-11-03 ENCOUNTER — Ambulatory Visit (INDEPENDENT_AMBULATORY_CARE_PROVIDER_SITE_OTHER): Payer: BC Managed Care – PPO | Admitting: Adult Health

## 2014-11-03 VITALS — BP 116/72 | HR 101 | Temp 97.2°F | Ht 68.0 in | Wt 137.2 lb

## 2014-11-03 DIAGNOSIS — J189 Pneumonia, unspecified organism: Secondary | ICD-10-CM

## 2014-11-03 LAB — CBC WITH DIFFERENTIAL/PLATELET
BASOS ABS: 0 10*3/uL (ref 0.0–0.1)
BASOS PCT: 0.5 % (ref 0.0–3.0)
EOS ABS: 0 10*3/uL (ref 0.0–0.7)
Eosinophils Relative: 0.2 % (ref 0.0–5.0)
HCT: 41.7 % (ref 36.0–46.0)
HEMOGLOBIN: 13.9 g/dL (ref 12.0–15.0)
LYMPHS ABS: 1.4 10*3/uL (ref 0.7–4.0)
Lymphocytes Relative: 19.4 % (ref 12.0–46.0)
MCHC: 33.4 g/dL (ref 30.0–36.0)
MCV: 82.9 fl (ref 78.0–100.0)
MONO ABS: 0.4 10*3/uL (ref 0.1–1.0)
Monocytes Relative: 5.6 % (ref 3.0–12.0)
NEUTROS ABS: 5.3 10*3/uL (ref 1.4–7.7)
Neutrophils Relative %: 74.3 % (ref 43.0–77.0)
Platelets: 562 10*3/uL — ABNORMAL HIGH (ref 150.0–400.0)
RBC: 5.03 Mil/uL (ref 3.87–5.11)
RDW: 17 % — ABNORMAL HIGH (ref 11.5–15.5)
WBC: 7.1 10*3/uL (ref 4.0–10.5)

## 2014-11-03 NOTE — Assessment & Plan Note (Signed)
Clinically improving w/ suspected lag time on cXR  CBC reveals nml WBC  Will set up for repeat CT chest in 6 weeks  Will need to remain off Remicade until cxr is improved   Plan  Remain off Remicade for now  Continue on current regimen  Follow up Dr. Chase Caller in 6 weeks with CT chest  Please contact office for sooner follow up if symptoms do not improve or worsen or seek emergency care

## 2014-11-03 NOTE — Patient Instructions (Addendum)
Remain off Remicade for now  Continue on current regimen  Follow up Dr. Chase Caller in 6 weeks with CT chest  Please contact office for sooner follow up if symptoms do not improve or worsen or seek emergency care

## 2014-11-03 NOTE — Progress Notes (Signed)
Subjective:    Patient ID: Meghan Charles, female    DOB: Aug 21, 1971, 43 y.o.   MRN: [id number]  HPI    OV 10/02/2014  Chief Complaint  Patient presents with  . HFU    Pt c/o having increased fatigue since discharge. She also c/o having increased SOB.    43 year old female previously healthy but then was diagnosed with Crohn's disease in June 2015. In September 2000 and received her first dose of Remicade. Then on 09/24/2014 when she was supposed to get a second dose of Remicade Meghan Charles up in the hospital with bilateral pneumonia and associated left-sided exudative pleural effusion. Cultures have been negative. She was treated for a bacterial process. She was discharged 3 days ago on 09/29/2014. Since discharge she has been on Levaquin 750 mg daily for 8 days with 5 more days to go. She says that she got any worse but overall is extremely fatigued if she does any activities of daily living. Denies any fever, cough, rhinitis, chills, chest pain, wheeze, hemoptysis.  On exam today given that this is significantly tachycardic with a resting heart rate of 130 per minute. Walking desat  After 3 laps 185 feeet - HR rose to 150/min. Pulse ox lowest was 93%-94%  - EKG - sinus tach at rest, HR 130. Nil acute         10/08/2014 Follow up PNA  Returns for 1 week follow up for PNA . Reports symptoms have improved about 70% since last ov.  no new complaints - does report some fever up to 101 on 10/19 but resolved with no return.  Cough is improving but still weak.  Seen by GI this am, Remicade is on hold d/t recent PNA. Would like to restart Remicade rather than  surgical intervention for Crohn's enteritis . She was changed from prednisone to Entocort 9mg  daily .  CXR today shows no change in bilateral infiltrates  >>remain off remicade   11/03/2014 Follow up PNA  Returns for follow up for bilateral bilateral pneumonia and associated left-sided exudative pleural effusion.  She has Chron's Dz  previous on Remicade Now On hold  She is feeling better, back to work fulltime. Does wear out some not back to exercise yet.  No hemoptysis or fever.  CXR w/ no sign change in infiltrates.  Says that her Crohn's is under control with no flare of diarrhea. No fevers and appetite has returned to normal   Review of Systems  Constitutional: Positive for fatigue. Negative for fever and unexpected weight change.  HENT: Negative for congestion, dental problem, ear pain, nosebleeds, postnasal drip, rhinorrhea, sinus pressure, sneezing, sore throat and trouble swallowing.   Eyes: Negative for redness and itching.  Respiratory: Positive for cough and shortness of breath. Negative for chest tightness and wheezing.   Cardiovascular: Negative for palpitations and leg swelling.  Gastrointestinal: Negative for nausea and vomiting.  Genitourinary: Negative for dysuria.  Musculoskeletal: Negative for joint swelling.  Skin: Negative for rash.  Neurological: Negative for headaches.  Hematological: Does not bruise/bleed easily.  Psychiatric/Behavioral: Negative for dysphoric mood. The patient is not nervous/anxious.        Objective:   Physical Exam GEN: A/Ox3; pleasant , NAD, well nourished   HEENT:  [place]/AT,  EACs-clear, TMs-wnl, NOSE-clear, THROAT-clear, no lesions, no postnasal drip or exudate noted.   NECK:  Supple w/ fair ROM; no JVD; normal carotid impulses w/o bruits; no thyromegaly or nodules palpated; no lymphadenopathy.  RESP  Clear  P & A;  w/o, wheezes/ rales/ or rhonchi.no accessory muscle use, no dullness to percussion  CARD:  RRR, no m/r/g  , no peripheral edema, pulses intact, no cyanosis or clubbing.  GI:   Soft & nt; nml bowel sounds; no organomegaly or masses detected.  Musco: Warm bil, no deformities or joint swelling noted.   Neuro: alert, no focal deficits noted.    Skin: Warm, no lesions or rashes   CXR 11/03/2014  Stable conglomerate opacities in the upper lobes  bilaterally dating back to the CT chest 09/25/2014. 2. Stable small left pleural effusion since the 10/08/2014 chest x-ray. 3. No new abnormalities.     Assessment & Plan:

## 2014-11-06 ENCOUNTER — Encounter: Payer: Self-pay | Admitting: *Deleted

## 2014-11-06 ENCOUNTER — Other Ambulatory Visit: Payer: Self-pay | Admitting: Adult Health

## 2014-11-06 DIAGNOSIS — J189 Pneumonia, unspecified organism: Secondary | ICD-10-CM

## 2014-11-06 NOTE — Progress Notes (Signed)
Quick Note:  Pt already aware ______

## 2014-11-06 NOTE — Progress Notes (Signed)
Quick Note:  TP had already discussed with patient Orders only encounter created for CT chest non-contrast to f/u pneumonia - will need to be done at the end of December with appt with TP. ______

## 2014-11-06 NOTE — Progress Notes (Signed)
Result Notes     Notes Recorded by Rinaldo Ratel, CMA on 11/06/2014 at 9:31 AM TP had already discussed with patient Orders only encounter created for CT chest non-contrast to f/u pneumonia - will need to be done at the end of December with appt with TP. ------  Notes Recorded by Melvenia Needles, NP on 11/03/2014 at 5:18 PM Pt aware  Repeat CT chest in 5-6 weeks

## 2014-11-09 ENCOUNTER — Encounter: Payer: Self-pay | Admitting: Internal Medicine

## 2014-11-09 ENCOUNTER — Ambulatory Visit (INDEPENDENT_AMBULATORY_CARE_PROVIDER_SITE_OTHER): Payer: BC Managed Care – PPO | Admitting: Internal Medicine

## 2014-11-09 VITALS — BP 130/88 | HR 100 | Ht 68.0 in | Wt 138.0 lb

## 2014-11-09 DIAGNOSIS — J189 Pneumonia, unspecified organism: Secondary | ICD-10-CM

## 2014-11-09 DIAGNOSIS — K50919 Crohn's disease, unspecified, with unspecified complications: Secondary | ICD-10-CM

## 2014-11-09 NOTE — Patient Instructions (Signed)
Please continue Entocort 9 mg (3 tablets) daily x 1 month, then decrease to 6 mg (2 tablets daily) until follow up with Dr Hilarie Fredrickson  Please follow up with Dr Hilarie Fredrickson on Thursday 12/31/14 at 2 pm.

## 2014-11-09 NOTE — Progress Notes (Signed)
Subjective:    Patient ID: Meghan Charles, female    DOB: [birth year], 43 y.o.   MRN: [id number]  HPI Meghan Charles is a 43 year old female with Crohn's enteritis, recently complicated by bilateral pneumonia with parapneumonic effusion requiring thoracentesis who is seen for follow-up. She was last seen on 10/08/2014 at which point we switched her prednisone to budesonide 9 mg daily. She has followed up with pulmonology and has follow-up with them in early January with repeat CT scan the same day. They did not want to start Remicade back at this point until her follow-up pulmonology appointment. She continues to have some days where she has more dyspnea on exertion and others. No dyspnea at rest. She's feeling overall fairly well. She's avoided high residue foods but denies abdominal pain. Bowel movements have become more regular for her with more formed. No abdominal pain. No rectal bleeding or melena. No fevers or chills. She is back to work.   Review of Systems As per history of present illness, otherwise negative  Current Medications, Allergies, Past Medical History, Past Surgical History, Family History and Social History were reviewed in [employer] record.     Objective:   Physical Exam BP 130/88 mmHg  Pulse 100  Ht 5' 8"  (1.727 m)  Wt 138 lb (62.596 kg)  BMI 20.99 kg/m2 Constitutional: Well-developed and well-nourished. No distress. HEENT: Normocephalic and atraumatic. Oropharynx is clear and moist. No oropharyngeal exudate. Conjunctivae are normal.  No scleral icterus. Neck: Neck supple. Trachea midline. Cardiovascular: Normal rate, regular rhythm and intact distal pulses. No M/R/G Pulmonary/chest: Effort normal and breath sounds normal. No wheezing, rales or rhonchi. Abdominal: Soft, nontender, nondistended. Bowel sounds active throughout. There are no masses palpable. No hepatosplenomegaly. Extremities: no clubbing, cyanosis, or edema. Neurological: Alert and  oriented to person place and time. Skin: Skin is warm and dry. No rashes noted. Psychiatric: Normal mood and affect. Behavior is normal.  CBC    Component Value Date/Time   WBC 7.1 11/03/2014 1506   RBC 5.03 11/03/2014 1506   RBC 3.91 11/05/2012 0837   HGB 13.9 11/03/2014 1506   HCT 41.7 11/03/2014 1506   PLT 562.0* 11/03/2014 1506   MCV 82.9 11/03/2014 1506   MCH 28.2 09/28/2014 0415   MCHC 33.4 11/03/2014 1506   RDW 17.0* 11/03/2014 1506   LYMPHSABS 1.4 11/03/2014 1506   MONOABS 0.4 11/03/2014 1506   EOSABS 0.0 11/03/2014 1506   BASOSABS 0.0 11/03/2014 1506    CMP     Component Value Date/Time   NA 136 10/08/2014 1657   K 4.4 10/08/2014 1657   CL 102 10/08/2014 1657   CO2 24 10/08/2014 1657   GLUCOSE 84 10/08/2014 1657   BUN 12 10/08/2014 1657   CREATININE 0.7 10/08/2014 1657   CALCIUM 9.4 10/08/2014 1657   PROT 6.4 09/28/2014 0415   ALBUMIN 3.5 09/24/2014 1126   AST 23 09/24/2014 1126   ALT 25 09/24/2014 1126   ALKPHOS 65 09/24/2014 1126   BILITOT 0.4 09/24/2014 1126   GFRNONAA >90 09/28/2014 0415   GFRAA >90 09/28/2014 0415        Assessment & Plan:   43 year old female with Crohn's enteritis, recently complicated by bilateral pneumonia with parapneumonic effusion requiring thoracentesis who is seen for follow-up  1. Crohn's enteritis -- she has done well with Entocort 9 mg daily. She is nearing clinical remission, if not already there. Biologic therapy on hold as she continues to follow-up with pulmonology after her bilateral pneumonia with  effusion. She remains most interested in avoiding surgery if at all possible. I would like her to continue Entocort 9 mg daily for 8 weeks, we will then taper to 6 mg daily. We will likely repeat video capsule endoscopy to evaluate for mucosal healing, but discuss this at next appointment. Hold biologic without plan for further infliximab at this time until cleared by pulmonology. Even when clear we will have to have the  discussion regarding risks, as we did before. At this point she is leaning towards trying the medication again if it will keep her Crohn's disease in remission. I stretched her she can liberalize her diet and add back some vegetables and fruits in small quantity.  2. Bilateral pneumonia with parapneumonic effusion -- following the pulmonary, next scheduled to see them on 12/21/2014.

## 2014-12-14 ENCOUNTER — Telehealth: Payer: Self-pay | Admitting: Internal Medicine

## 2014-12-14 DIAGNOSIS — K50919 Crohn's disease, unspecified, with unspecified complications: Secondary | ICD-10-CM

## 2014-12-14 NOTE — Telephone Encounter (Signed)
Did Dr Fuller Plan sign the note already? Do you want me to address the question.? She may need a KUB to r/o SBO. Increase Entecort to 45m/day if not on it alrteady. Bowl rest. Check CBC,b-met, sed.rate.

## 2014-12-14 NOTE — Telephone Encounter (Signed)
Patient of Dr. Vena Rua with hx Crohn's enteritis currently on Entocort. States she has been vomiting since Christmas day and having abdominal pain in the center of abdomen and to the right side. States she last vomited at 1:30 AM. Reports she is sipping liquids and has kept her medication down. She is not taking anything for nausea. She is having small bowel movements without any blood. Dr. Hilarie Fredrickson is off and no OV appointments today or tomorrow are available. Please, advise.

## 2014-12-15 ENCOUNTER — Telehealth: Payer: Self-pay | Admitting: Internal Medicine

## 2014-12-15 NOTE — Telephone Encounter (Signed)
Please see previous phone note.  

## 2014-12-15 NOTE — Telephone Encounter (Signed)
Spoke with patient and she is on Entocort 9 mg now. She will start clear liquids for bowel rest. She will come for labs and per Dr. Olevia Perches have a KUB also.

## 2014-12-16 ENCOUNTER — Encounter (HOSPITAL_COMMUNITY): Payer: BC Managed Care – PPO

## 2014-12-16 ENCOUNTER — Ambulatory Visit (INDEPENDENT_AMBULATORY_CARE_PROVIDER_SITE_OTHER)
Admission: RE | Admit: 2014-12-16 | Discharge: 2014-12-16 | Disposition: A | Discharge status: Home / Self Care | Payer: BC Managed Care – PPO | Source: Ambulatory Visit | Attending: Internal Medicine | Admitting: Internal Medicine

## 2014-12-16 ENCOUNTER — Other Ambulatory Visit (INDEPENDENT_AMBULATORY_CARE_PROVIDER_SITE_OTHER): Payer: BC Managed Care – PPO

## 2014-12-16 ENCOUNTER — Telehealth: Payer: Self-pay | Admitting: Internal Medicine

## 2014-12-16 DIAGNOSIS — K50919 Crohn's disease, unspecified, with unspecified complications: Secondary | ICD-10-CM

## 2014-12-16 LAB — CBC WITH DIFFERENTIAL/PLATELET
BASOS PCT: 0.7 % (ref 0.0–3.0)
Basophils Absolute: 0.1 10*3/uL (ref 0.0–0.1)
EOS ABS: 0.1 10*3/uL (ref 0.0–0.7)
Eosinophils Relative: 0.9 % (ref 0.0–5.0)
HEMATOCRIT: 40.4 % (ref 36.0–46.0)
HEMOGLOBIN: 13.6 g/dL (ref 12.0–15.0)
LYMPHS PCT: 22.2 % (ref 12.0–46.0)
Lymphs Abs: 2 10*3/uL (ref 0.7–4.0)
MCHC: 33.7 g/dL (ref 30.0–36.0)
MCV: 86.1 fl (ref 78.0–100.0)
MONOS PCT: 9 % (ref 3.0–12.0)
Monocytes Absolute: 0.8 10*3/uL (ref 0.1–1.0)
Neutro Abs: 6 10*3/uL (ref 1.4–7.7)
Neutrophils Relative %: 67.2 % (ref 43.0–77.0)
Platelets: 437 10*3/uL — ABNORMAL HIGH (ref 150.0–400.0)
RBC: 4.7 Mil/uL (ref 3.87–5.11)
RDW: 15.8 % — AB (ref 11.5–15.5)
WBC: 8.9 10*3/uL (ref 4.0–10.5)

## 2014-12-16 LAB — BASIC METABOLIC PANEL
BUN: 16 mg/dL (ref 6–23)
CALCIUM: 9.4 mg/dL (ref 8.4–10.5)
CHLORIDE: 106 meq/L (ref 96–112)
CO2: 24 meq/L (ref 19–32)
Creatinine, Ser: 0.7 mg/dL (ref 0.4–1.2)
GFR: 92.35 mL/min (ref >60.00)
GLUCOSE: 99 mg/dL (ref 70–99)
Potassium: 3.5 mEq/L (ref 3.5–5.1)
Sodium: 139 mEq/L (ref 135–145)

## 2014-12-16 LAB — SEDIMENTATION RATE: Sed Rate: 40 mm/hr — ABNORMAL HIGH (ref 0–22)

## 2014-12-16 NOTE — Telephone Encounter (Signed)
Discussed with the pt- calle in Prednisone 10 mg, to take in addition to Entecort, till next week when Dr Hilarie Fredrickson available

## 2014-12-16 NOTE — Telephone Encounter (Signed)
Patient given recommendations again.

## 2014-12-16 NOTE — Telephone Encounter (Signed)
Pt said she was so sick she doesn't remember what Rollene Fare originally told her to do on 12-15-14 and requesting a phone call back from the nurse

## 2014-12-16 NOTE — Telephone Encounter (Signed)
Patient is asking for results of lab/xray. Please, advise.

## 2014-12-17 ENCOUNTER — Telehealth: Payer: Self-pay | Admitting: Internal Medicine

## 2014-12-17 MED ORDER — ONDANSETRON HCL 4 MG PO TABS: ORAL_TABLET | ORAL | Status: DC

## 2014-12-17 NOTE — Telephone Encounter (Signed)
Left a message for patient to call back. 

## 2014-12-17 NOTE — Telephone Encounter (Signed)
Patient of Dr. Vena Rua with Crohn's. Calling to report vomiting this AM. States she is sipping Sprite but earlier even water came back up. She had labs and abdominal xray yesterday.(no SBO) She has not gotten the Prednisone yet that Dr. Olevia Perches ordered. She is asking for something for nausea. Please, advise.

## 2014-12-17 NOTE — Telephone Encounter (Signed)
Rx sent to pharmacy. Patient notified of recommendations.  

## 2014-12-17 NOTE — Telephone Encounter (Signed)
Zofran 4 mg q 6 hours prn for nausea-#40/0 refills- take it regularly this weekend so can push liquids- get started on the prednisone today

## 2014-12-21 ENCOUNTER — Ambulatory Visit (INDEPENDENT_AMBULATORY_CARE_PROVIDER_SITE_OTHER): Payer: BC Managed Care – PPO | Admitting: Adult Health

## 2014-12-21 ENCOUNTER — Encounter: Payer: Self-pay | Admitting: Adult Health

## 2014-12-21 ENCOUNTER — Telehealth: Payer: Self-pay | Admitting: Adult Health

## 2014-12-21 ENCOUNTER — Ambulatory Visit (INDEPENDENT_AMBULATORY_CARE_PROVIDER_SITE_OTHER)
Admission: RE | Admit: 2014-12-21 | Discharge: 2014-12-21 | Disposition: A | Discharge status: Home / Self Care | Payer: BC Managed Care – PPO | Source: Ambulatory Visit | Attending: Adult Health | Admitting: Adult Health

## 2014-12-21 VITALS — BP 110/70 | HR 99 | Temp 97.9°F | Ht 67.5 in | Wt 139.2 lb

## 2014-12-21 DIAGNOSIS — J90 Pleural effusion, not elsewhere classified: Secondary | ICD-10-CM

## 2014-12-21 DIAGNOSIS — J189 Pneumonia, unspecified organism: Secondary | ICD-10-CM

## 2014-12-21 NOTE — Telephone Encounter (Signed)
Dr. Chase Caller  Discussed case with you regarding clinically improved  CT chest is improved w/ resolution of effusion but persistent bilateral opacities unchanged  Will cont to follow and repeat CXR on return   When can she restart therapy for her Crohns' ?  Please let GI know your thoughts

## 2014-12-21 NOTE — Progress Notes (Signed)
Subjective:    Patient ID: Meghan Charles, female    DOB: [birth year], 44 y.o.   MRN: [id number]  HPI    OV 10/02/2014  Chief Complaint  Patient presents with  . HFU    Pt c/o having increased fatigue since discharge. She also c/o having increased SOB.    44 year old female previously healthy but then was diagnosed with Crohn's disease in June 2015. In September 2000 and received her first dose of Remicade. Then on 09/24/2014 when she was supposed to get a second dose of Remicade [name] up in the hospital with bilateral pneumonia and associated left-sided exudative pleural effusion. Cultures have been negative. She was treated for a bacterial process. She was discharged 3 days ago on 09/29/2014. Since discharge she has been on Levaquin 750 mg daily for 8 days with 5 more days to go. She says that she got any worse but overall is extremely fatigued if she does any activities of daily living. Denies any fever, cough, rhinitis, chills, chest pain, wheeze, hemoptysis.  On exam today given that this is significantly tachycardic with a resting heart rate of 130 per minute. Walking desat  After 3 laps 185 feeet - HR rose to 150/min. Pulse ox lowest was 93%-94%  - EKG - sinus tach at rest, HR 130. Nil acute         10/08/2014 Follow up PNA  Returns for 1 week follow up for PNA . Reports symptoms have improved about 70% since last ov.  no new complaints - does report some fever up to 101 on 10/19 but resolved with no return.  Cough is improving but still weak.  Seen by GI this am, Remicade is on hold d/t recent PNA. Would like to restart Remicade rather than  surgical intervention for Crohn's enteritis . She was changed from prednisone to Entocort 9mg  daily .  CXR today shows no change in bilateral infiltrates  >>remain off remicade    Follow up PNA  Returns for follow up for bilateral bilateral pneumonia and associated left-sided exudative pleural effusion.  She has Chron's Dz previous on  Remicade Now On hold  She is feeling better, back to work fulltime. Does wear out some not back to exercise yet.  No hemoptysis or fever.  CXR w/ no sign change in infiltrates.  Says that her Crohn's is under control with no flare of diarrhea. No fevers and appetite has returned to normal  12/21/2014 Follow and test results  Returns for follow up for bilateral bilateral pneumonia and associated left-sided exudative pleural effusion.  She has Chron's Dz previous on Remicade Now On hold .  Pt underwent CT chest today , CT showed stable aspdz opacities w/in both upper/lower lobes. Left effusion is much improved and nearly resolved entirely.  Says her breathing is doing well , no flare of cough or dyspnea. Says she is near her baseline and activity level although does have some lingering weakness.  Has returned back to work fulltime.  Has been having more issues with Chron's lately , followed GI . Would like to restart Remicade .     Review of Systems  Constitutional: Positive for fatigue. Negative for fever and unexpected weight change.  HENT: Negative for congestion, dental problem, ear pain, nosebleeds, postnasal drip, rhinorrhea, sinus pressure, sneezing, sore throat and trouble swallowing.   Eyes: Negative for redness and itching.  Respiratory: neg for hemoptysis  Negative for chest tightness and wheezing.   Cardiovascular: Negative for palpitations and leg swelling.  Gastrointestinal: Negative for nausea and vomiting.  Genitourinary: Negative for dysuria.  Musculoskeletal: Negative for joint swelling.  Skin: Negative for rash.  Neurological: Negative for headaches.  Hematological: Does not bruise/bleed easily.  Psychiatric/Behavioral: Negative for dysphoric mood. The patient is not nervous/anxious.        Objective:   Physical Exam GEN: A/Ox3; pleasant , NAD, well nourished   HEENT:  [place]/AT,  EACs-clear, TMs-wnl, NOSE-clear, THROAT-clear, no lesions, no postnasal drip or  exudate noted.   NECK:  Supple w/ fair ROM; no JVD; normal carotid impulses w/o bruits; no thyromegaly or nodules palpated; no lymphadenopathy.  RESP  Clear  P & A; w/o, wheezes/ rales/ or rhonchi.no accessory muscle use, no dullness to percussion  CARD:  RRR, no m/r/g  , no peripheral edema, pulses intact, no cyanosis or clubbing.  GI:   Soft & nt; nml bowel sounds; no organomegaly or masses detected.  Musco: Warm bil, no deformities or joint swelling noted.   Neuro: alert, no focal deficits noted.    Skin: Warm, no lesions or rashes       Assessment & Plan:

## 2014-12-21 NOTE — Patient Instructions (Signed)
Continue on current regimen  Follow up Dr. Ramaswamy in 6 weeks with chest xray  Please contact office for sooner follow up if symptoms do not improve or worsen or seek emergency care    

## 2014-12-23 ENCOUNTER — Telehealth: Payer: Self-pay | Admitting: Internal Medicine

## 2014-12-23 MED ORDER — DICYCLOMINE HCL 10 MG PO CAPS: ORAL_CAPSULE | ORAL | Status: DC

## 2014-12-23 MED ORDER — TRAMADOL HCL 50 MG PO TABS: 50.0000 mg | ORAL_TABLET | Freq: Four times a day (QID) | ORAL | Status: DC | PRN

## 2014-12-23 NOTE — Telephone Encounter (Signed)
Pt states she is taking prednisone 25m daily along with budesonide. Pt stated that she is taking 2 budesonide daily along with zofran. Pt states she is having pain in her stomach. States it feels like there is a wrench tightening and releasing her stomach. Pt wants to know what she can take for the pain. States she is able to eat a little more now and that has helped some. Please advise.

## 2014-12-23 NOTE — Telephone Encounter (Signed)
Meghan Charles Please call Meghan Charles to see how she is doing. It seems like she was having obstructive type symptoms over the holiday break.  Diet was reduced and prednisone was started She what she is taking (budesonide and/or prednisone), how she is feeling and what she is able to tolerate by mouth? She also has recently followed up with pulmonary and biologic therapy is on hold after having PNA with effusions in the fall 2015

## 2014-12-23 NOTE — Telephone Encounter (Signed)
See additional phone note.

## 2014-12-23 NOTE — Telephone Encounter (Signed)
Spoke with pt and she is aware. Scripts sent to pharmacy for bentyl and tramadol. Pt has OV scheduled for 12/31/14 with Dr. Hilarie Fredrickson.

## 2014-12-23 NOTE — Telephone Encounter (Signed)
Would increase budesonide 9 mg daily Can try Bentyl 20 mg 3-4 times daily for cramping pain Pain more severe than what Bentyl will help we can try tramadol 50 mg every 6 hours as needed I'm concerned that her Crohn's disease remains active causing the symptoms, and with the inability to use biologic therapy or further escalate therapy due to recent pulmonary infection surgery may need to be considered, which I know she definitely doesn't want I want to see her in clinic next available

## 2014-12-26 ENCOUNTER — Inpatient Hospital Stay (HOSPITAL_COMMUNITY)
Admission: EM | Admit: 2014-12-26 | Discharge: 2014-12-28 | DRG: 386 | Disposition: A | Discharge status: Home / Self Care | Payer: BLUE CROSS/BLUE SHIELD | Attending: Internal Medicine | Admitting: Internal Medicine

## 2014-12-26 ENCOUNTER — Emergency Department (HOSPITAL_COMMUNITY): Payer: BLUE CROSS/BLUE SHIELD

## 2014-12-26 ENCOUNTER — Encounter (HOSPITAL_COMMUNITY): Payer: Self-pay | Admitting: Emergency Medicine

## 2014-12-26 DIAGNOSIS — I10 Essential (primary) hypertension: Secondary | ICD-10-CM | POA: Diagnosis present

## 2014-12-26 DIAGNOSIS — K5010 Crohn's disease of large intestine without complications: Secondary | ICD-10-CM | POA: Diagnosis present

## 2014-12-26 DIAGNOSIS — Z85038 Personal history of other malignant neoplasm of large intestine: Secondary | ICD-10-CM

## 2014-12-26 DIAGNOSIS — K5669 Other intestinal obstruction: Secondary | ICD-10-CM | POA: Diagnosis present

## 2014-12-26 DIAGNOSIS — Z86010 Personal history of colonic polyps: Secondary | ICD-10-CM | POA: Diagnosis not present

## 2014-12-26 DIAGNOSIS — F419 Anxiety disorder, unspecified: Secondary | ICD-10-CM | POA: Diagnosis present

## 2014-12-26 DIAGNOSIS — R1031 Right lower quadrant pain: Secondary | ICD-10-CM

## 2014-12-26 DIAGNOSIS — R0602 Shortness of breath: Secondary | ICD-10-CM

## 2014-12-26 DIAGNOSIS — R000 Tachycardia, unspecified: Secondary | ICD-10-CM

## 2014-12-26 DIAGNOSIS — K824 Cholesterolosis of gallbladder: Secondary | ICD-10-CM | POA: Diagnosis present

## 2014-12-26 DIAGNOSIS — K5090 Crohn's disease, unspecified, without complications: Secondary | ICD-10-CM

## 2014-12-26 DIAGNOSIS — K50119 Crohn's disease of large intestine with unspecified complications: Secondary | ICD-10-CM

## 2014-12-26 DIAGNOSIS — K50912 Crohn's disease, unspecified, with intestinal obstruction: Secondary | ICD-10-CM

## 2014-12-26 DIAGNOSIS — D72829 Elevated white blood cell count, unspecified: Secondary | ICD-10-CM

## 2014-12-26 LAB — CBC WITH DIFFERENTIAL/PLATELET
Basophils Absolute: 0 10*3/uL (ref 0.0–0.1)
Basophils Absolute: 0 10*3/uL (ref 0.0–0.1)
Basophils Relative: 0 % (ref 0–1)
Basophils Relative: 0 % (ref 0–1)
EOS ABS: 0 10*3/uL (ref 0.0–0.7)
Eosinophils Absolute: 0.1 10*3/uL (ref 0.0–0.7)
Eosinophils Relative: 1 % (ref 0–5)
Eosinophils Relative: 0 % (ref 0–5)
HEMATOCRIT: 44.1 % (ref 36.0–46.0)
HEMATOCRIT: 40.5 % (ref 36.0–46.0)
HEMOGLOBIN: 14.1 g/dL (ref 12.0–15.0)
Hemoglobin: 13.1 g/dL (ref 12.0–15.0)
LYMPHS PCT: 13 % (ref 12–46)
Lymphocytes Relative: 8 % — ABNORMAL LOW (ref 12–46)
Lymphs Abs: 2.1 10*3/uL (ref 0.7–4.0)
Lymphs Abs: 0.9 10*3/uL (ref 0.7–4.0)
MCH: 28.2 pg (ref 26.0–34.0)
MCH: 28.6 pg (ref 26.0–34.0)
MCHC: 32 g/dL (ref 30.0–36.0)
MCHC: 32.3 g/dL (ref 30.0–36.0)
MCV: 88.2 fL (ref 78.0–100.0)
MCV: 88.4 fL (ref 78.0–100.0)
MONO ABS: 0.2 10*3/uL (ref 0.1–1.0)
MONOS PCT: 9 % (ref 3–12)
Monocytes Absolute: 1.4 10*3/uL — ABNORMAL HIGH (ref 0.1–1.0)
Monocytes Relative: 2 % — ABNORMAL LOW (ref 3–12)
NEUTROS PCT: 77 % (ref 43–77)
Neutro Abs: 12.1 10*3/uL — ABNORMAL HIGH (ref 1.7–7.7)
Neutro Abs: 10.1 10*3/uL — ABNORMAL HIGH (ref 1.7–7.7)
Neutrophils Relative %: 90 % — ABNORMAL HIGH (ref 43–77)
PLATELETS: 348 10*3/uL (ref 150–400)
Platelets: 342 10*3/uL (ref 150–400)
RBC: 5 MIL/uL (ref 3.87–5.11)
RBC: 4.58 MIL/uL (ref 3.87–5.11)
RDW: 13.8 % (ref 11.5–15.5)
RDW: 13.7 % (ref 11.5–15.5)
WBC: 15.7 10*3/uL — ABNORMAL HIGH (ref 4.0–10.5)
WBC: 11.3 10*3/uL — ABNORMAL HIGH (ref 4.0–10.5)

## 2014-12-26 LAB — URINE MICROSCOPIC-ADD ON

## 2014-12-26 LAB — URINALYSIS, ROUTINE W REFLEX MICROSCOPIC
BILIRUBIN URINE: NEGATIVE
Glucose, UA: NEGATIVE mg/dL
Ketones, ur: NEGATIVE mg/dL
Nitrite: NEGATIVE
PH: 7 (ref 5.0–8.0)
PROTEIN: NEGATIVE mg/dL
SPECIFIC GRAVITY, URINE: 1.017 (ref 1.005–1.030)
Urobilinogen, UA: 1 mg/dL (ref 0.0–1.0)

## 2014-12-26 LAB — COMPREHENSIVE METABOLIC PANEL
ALBUMIN: 4.1 g/dL (ref 3.5–5.2)
ALT: 18 U/L (ref 0–35)
ALT: 13 U/L (ref 0–35)
ANION GAP: 8 (ref 5–15)
AST: 33 U/L (ref 0–37)
AST: 15 U/L (ref 0–37)
Albumin: 4.4 g/dL (ref 3.5–5.2)
Alkaline Phosphatase: 66 U/L (ref 39–117)
Alkaline Phosphatase: 61 U/L (ref 39–117)
Anion gap: 8 (ref 5–15)
BUN: 12 mg/dL (ref 6–23)
BUN: 12 mg/dL (ref 6–23)
CALCIUM: 9.8 mg/dL (ref 8.4–10.5)
CALCIUM: 9.5 mg/dL (ref 8.4–10.5)
CO2: 25 mmol/L (ref 19–32)
CO2: 27 mmol/L (ref 19–32)
CREATININE: 0.58 mg/dL (ref 0.50–1.10)
Chloride: 102 mEq/L (ref 96–112)
Chloride: 101 mEq/L (ref 96–112)
Creatinine, Ser: 0.73 mg/dL (ref 0.50–1.10)
GFR calc Af Amer: >90 mL/min (ref >90)
GFR calc non Af Amer: >90 mL/min (ref >90)
GLUCOSE: 107 mg/dL — AB (ref 70–99)
Glucose, Bld: 123 mg/dL — ABNORMAL HIGH (ref 70–99)
Potassium: 4 mmol/L (ref 3.5–5.1)
Potassium: 3.7 mmol/L (ref 3.5–5.1)
SODIUM: 135 mmol/L (ref 135–145)
Sodium: 136 mmol/L (ref 135–145)
TOTAL PROTEIN: 7.7 g/dL (ref 6.0–8.3)
Total Bilirubin: 1 mg/dL (ref 0.3–1.2)
Total Bilirubin: 0.9 mg/dL (ref 0.3–1.2)
Total Protein: 8.7 g/dL — ABNORMAL HIGH (ref 6.0–8.3)

## 2014-12-26 LAB — APTT: APTT: 25 s (ref 24–37)

## 2014-12-26 LAB — TSH: TSH: 0.853 u[IU]/mL (ref 0.350–4.500)

## 2014-12-26 LAB — MAGNESIUM: MAGNESIUM: 1.9 mg/dL (ref 1.5–2.5)

## 2014-12-26 LAB — PHOSPHORUS: PHOSPHORUS: 3.3 mg/dL (ref 2.3–4.6)

## 2014-12-26 LAB — POC URINE PREG, ED: PREG TEST UR: NEGATIVE

## 2014-12-26 LAB — LIPASE, BLOOD: LIPASE: 26 U/L (ref 11–59)

## 2014-12-26 LAB — PROTIME-INR
INR: 0.9 (ref 0.00–1.49)
PROTHROMBIN TIME: 12.3 s (ref 11.6–15.2)

## 2014-12-26 MED ORDER — ACETAMINOPHEN 650 MG RE SUPP: 650.0000 mg | Freq: Four times a day (QID) | RECTAL | Status: DC | PRN

## 2014-12-26 MED ORDER — ONDANSETRON HCL 4 MG/2ML IJ SOLN: 4.0000 mg | Freq: Once | INTRAMUSCULAR | Status: DC

## 2014-12-26 MED ORDER — IOHEXOL 300 MG/ML  SOLN
100.0000 mL | Freq: Once | INTRAMUSCULAR | Status: AC | PRN
  Administered 2014-12-26: 100 mL via INTRAVENOUS

## 2014-12-26 MED ORDER — DICYCLOMINE HCL 10 MG PO CAPS
10.0000 mg | ORAL_CAPSULE | Freq: Three times a day (TID) | ORAL | Status: DC | PRN
  Administered 2014-12-26 – 2014-12-27 (×5): 10 mg via ORAL
  Filled 2014-12-26 (×6): qty 1

## 2014-12-26 MED ORDER — ACETAMINOPHEN 325 MG PO TABS
650.0000 mg | ORAL_TABLET | Freq: Four times a day (QID) | ORAL | Status: DC | PRN
  Administered 2014-12-26: 650 mg via ORAL
  Filled 2014-12-26: qty 2

## 2014-12-26 MED ORDER — HYDROMORPHONE HCL 1 MG/ML IJ SOLN
1.0000 mg | Freq: Once | INTRAMUSCULAR | Status: AC
  Administered 2014-12-26: 1 mg via INTRAVENOUS
  Filled 2014-12-26: qty 1

## 2014-12-26 MED ORDER — ONDANSETRON HCL 4 MG/2ML IJ SOLN
4.0000 mg | Freq: Once | INTRAMUSCULAR | Status: AC
  Administered 2014-12-26: 4 mg via INTRAVENOUS
  Filled 2014-12-26: qty 2

## 2014-12-26 MED ORDER — METHYLPREDNISOLONE SODIUM SUCC 125 MG IJ SOLR
125.0000 mg | Freq: Once | INTRAMUSCULAR | Status: AC
Start: 2014-12-26 — End: 2014-12-26
  Administered 2014-12-26: 125 mg via INTRAVENOUS
  Filled 2014-12-26: qty 2

## 2014-12-26 MED ORDER — METHYLPREDNISOLONE SODIUM SUCC 125 MG IJ SOLR: 60.0000 mg | Freq: Two times a day (BID) | INTRAMUSCULAR | Status: DC

## 2014-12-26 MED ORDER — SODIUM CHLORIDE 0.9 % IV SOLN
Freq: Once | INTRAVENOUS | Status: AC
  Administered 2014-12-26: 06:00:00 via INTRAVENOUS

## 2014-12-26 MED ORDER — ONDANSETRON HCL 4 MG/2ML IJ SOLN
4.0000 mg | Freq: Four times a day (QID) | INTRAMUSCULAR | Status: DC | PRN
  Administered 2014-12-27: 4 mg via INTRAVENOUS
  Filled 2014-12-26: qty 2

## 2014-12-26 MED ORDER — SODIUM CHLORIDE 0.9 % IV SOLN
INTRAVENOUS | Status: AC
  Administered 2014-12-26 (×2): via INTRAVENOUS

## 2014-12-26 MED ORDER — BUDESONIDE 3 MG PO CP24
9.0000 mg | ORAL_CAPSULE | Freq: Every day | ORAL | Status: DC
  Administered 2014-12-26 – 2014-12-28 (×3): 9 mg via ORAL
  Filled 2014-12-26 (×3): qty 3

## 2014-12-26 MED ORDER — HYDRALAZINE HCL 20 MG/ML IJ SOLN
5.0000 mg | Freq: Four times a day (QID) | INTRAMUSCULAR | Status: DC | PRN
  Administered 2014-12-26 (×2): 5 mg via INTRAVENOUS
  Filled 2014-12-26 (×2): qty 1

## 2014-12-26 MED ORDER — IOHEXOL 300 MG/ML  SOLN
50.0000 mL | Freq: Once | INTRAMUSCULAR | Status: AC | PRN
  Administered 2014-12-26: 50 mL via ORAL

## 2014-12-26 MED ORDER — METHYLPREDNISOLONE SODIUM SUCC 40 MG IJ SOLR
20.0000 mg | Freq: Two times a day (BID) | INTRAMUSCULAR | Status: DC
  Administered 2014-12-26 – 2014-12-28 (×4): 20 mg via INTRAVENOUS
  Filled 2014-12-26 (×6): qty 0.5

## 2014-12-26 MED ORDER — MORPHINE SULFATE 2 MG/ML IJ SOLN
1.0000 mg | INTRAMUSCULAR | Status: DC | PRN
  Administered 2014-12-26 – 2014-12-27 (×3): 1 mg via INTRAVENOUS
  Filled 2014-12-26 (×3): qty 1

## 2014-12-26 MED ORDER — HYDROMORPHONE HCL 1 MG/ML IJ SOLN
1.0000 mg | Freq: Once | INTRAMUSCULAR | Status: AC | PRN
  Administered 2014-12-26: 1 mg via INTRAVENOUS
  Filled 2014-12-26: qty 1

## 2014-12-26 MED ORDER — ONDANSETRON HCL 4 MG PO TABS: 4.0000 mg | ORAL_TABLET | Freq: Four times a day (QID) | ORAL | Status: DC | PRN

## 2014-12-26 NOTE — H&P (Addendum)
[facility] History and Physical  Meghan Charles CZY:[id number] DOB: [birth year] DOA: 12/26/2014  Referring physician: ER physician PCP: Rexene Edison, NP   Chief Complaint: abd pain, N/V  HPI:  44 year old female with past medical history of Crohn's disease (has received Remicade in 08/2014), subsequently developed bilateral pneumonia and pleural effusion and for this reason Remicade was placed on hold, she is still on budesonide capsule 9 mg daily, she usually follows with Dr. Zenovia Jarred of GI. She presented to [facility] ED with progressive worsening mid abdominal pain over past few days prior ot this admission with associated nausea and vomiting. Pain is 10/10 in intensity, cramp like and not relieved with rest. It is present at rest and with movement. No associated blood in emesis, no blood in stool. No diarrhea, no constipation. No fevers or chills. No cough. No chest pain, shortness of breath or palpitations.  In ED, pt was hemodynamically stable. She continued to have intractable pain even with analgesia given in ED. CT abdomen showed inflammatory changes related to Crohn's disease. Her CXR showed chronic bilateral mid and upper lung infiltrates. She was given solumedrol 125 mg IV in ED. She was admitted for further management.  Assessment & Plan    Active Problems:   Crohn's disease exacerbation - CT abdomen showed inflammatory changes related to Crohn's disease - solumedrol given in ED 125 mg IV once - per GI will continue solumedrol 20 mg IV Q 12 hours - continue Entocort 9 mg PO daily. - allow small sips of clears - appreciate GI following.   Essential hypertension - hydralazine ordered PRN for BP above 140/90.  Bilateral lung infiltrates / leukocytosis  - recent bilateral PNA after Remicade - has pulmonary follow up for repeat scans coming up - no fever and no cough. She was recently on prednisone so leukocytosis likely elevated from that.   DVT prophylaxis:   SCD's  bilaterally   Radiological Exams on Admission: Dg Chest 2 View 12/26/2014 Bilateral mid and upper lung infiltrates appear chronic. No evidence of superimposed active lung disease.     Ct Abdomen Pelvis W Contrast 12/26/2014  Fluid filled dilated and thick-walled small bowel in the mid distal jejunum with transition zone in the right pelvis corresponding to a narrowed inflamed bowel loop. Changes likely represent active inflammation due to Crohn's disease. Small amount of free fluid in the pelvis. No abscess identified. Unchanged appearance of right adrenal gland nodule. Mild fatty infiltration of the liver.    Code Status: Full Family Communication: Plan of care discussed with the patient  Disposition Plan: Admit for further evaluation  Leisa Lenz, MD  [facility] Pager [phone]  Review of Systems:  Constitutional: Negative for fever, chills and malaise/fatigue. Negative for diaphoresis.  HENT: Negative for hearing loss, ear pain, nosebleeds, congestion, sore throat, neck pain, tinnitus and ear discharge.   Eyes: Negative for blurred vision, double vision, photophobia, pain, discharge and redness.  Respiratory: Negative for cough, hemoptysis, sputum production, shortness of breath, wheezing and stridor.   Cardiovascular: Negative for chest pain, palpitations, orthopnea, claudication and leg swelling.  Gastrointestinal: per HPI.  Genitourinary: Negative for dysuria, urgency, frequency, hematuria and flank pain.  Musculoskeletal: Negative for myalgias, back pain, joint pain and falls.  Skin: Negative for itching and rash.  Neurological: Negative for dizziness and weakness. Negative for tingling, tremors, sensory change, speech change, focal weakness, loss of consciousness and headaches.  Endo/Heme/Allergies: Negative for environmental allergies and polydipsia. Does not bruise/bleed easily.  Psychiatric/Behavioral: Negative for  suicidal ideas. The patient is not nervous/anxious.       Past Medical History  Diagnosis Date  . Gallbladder polyp   . Anxiety   . Colon polyps 05/2014    TUBULAR ADENOMA AND HYPERPLASTIC POLYP.  . Crohn's disease   . Tubular adenoma of colon    Past Surgical History  Procedure Laterality Date  . Other surgical history      heel surgery  . Wisdom teeth extracted    . Right heel surgery      with plates and screws  . Colonoscopy w/ biopsies  2015   Social History:  reports that she has never smoked. She has never used smokeless tobacco. She reports that she drinks alcohol. She reports that she does not use illicit drugs.  No Known Allergies  Family History:  Family History  Problem Relation Age of Onset  . Colon cancer Maternal Grandmother 71  . Esophageal cancer Neg Hx   . Stomach cancer Neg Hx   . Rectal cancer Neg Hx   . Pancreatic cancer Neg Hx      Prior to Admission medications   Medication Sig Start Date End Date Taking? Authorizing Provider  budesonide (ENTOCORT EC) 3 MG 24 hr capsule Take 3 capsules (9 mg total) by mouth daily. 10/08/14  Yes Jerene Bears, MD  ondansetron (ZOFRAN) 4 MG tablet Take one po every 6 hours prn nausea Patient taking differently: Take 4 mg by mouth every 8 (eight) hours as needed for nausea or vomiting. Take one po every 6 hours prn nausea 12/17/14  Yes Lafayette Dragon, MD  predniSONE (DELTASONE) 10 MG tablet Take 10 mg by mouth daily with breakfast.  12/17/14  Yes Historical Provider, MD  dicyclomine (BENTYL) 10 MG capsule Take 1 by mouth 3-4 times daily as needed for cramping Patient taking differently: Take 10 mg by mouth 3 (three) times daily as needed. For stomach cramps 12/23/14   Jerene Bears, MD  inFLIXimab (REMICADE) 100 MG injection Inject into the vein.    Historical Provider, MD  traMADol (ULTRAM) 50 MG tablet Take 1 tablet (50 mg total) by mouth every 6 (six) hours as needed. Patient taking differently: Take 50 mg by mouth every 6 (six) hours as needed for moderate pain.  12/23/14   Jerene Bears, MD   Physical Exam: Filed Vitals:   12/26/14 0419 12/26/14 0631  BP: 167/123 159/117  Pulse: 122 96  Temp: 97.6 F (36.4 C)   TempSrc: Oral   Resp: 20 18  SpO2: 99% 97%    Physical Exam  Constitutional: Appears well-developed and well-nourished. No distress.  HENT: Normocephalic. No tonsillar erythema or exudates Eyes: Conjunctivae and EOM are normal. PERRLA, no scleral icterus.  Neck: Normal ROM. Neck supple. No JVD. No tracheal deviation. No thyromegaly.  CVS: RRR, S1/S2 +, no murmurs, no gallops, no carotid bruit.  Pulmonary: Effort and breath sounds normal, no stridor, rhonchi, wheezes, rales.  Abdominal: Soft. BS +,  no distension, tenderness, rebound or guarding.  Musculoskeletal: Normal range of motion. No edema and no tenderness.  Lymphadenopathy: No lymphadenopathy noted, cervical, inguinal. Neuro: Alert. Normal reflexes, muscle tone coordination. No focal neurologic deficits. Skin: Skin is warm and dry. No rash noted.  No erythema. No pallor.  Psychiatric: Normal mood and affect. Behavior, judgment, thought content normal.   Labs on Admission:  Basic Metabolic Panel:  Recent Labs Lab 12/26/14 0508  NA 135  K 4.0  CL 102  CO2 25  GLUCOSE 107*  BUN 12  CREATININE 0.73  CALCIUM 9.8   Liver Function Tests:  Recent Labs Lab 12/26/14 0508  AST 33  ALT 18  ALKPHOS 66  BILITOT 1.0  PROT 8.7*  ALBUMIN 4.4    Recent Labs Lab 12/26/14 0508  LIPASE 26   No results for input(s): AMMONIA in the last 168 hours. CBC:  Recent Labs Lab 12/26/14 0508  WBC 15.7*  NEUTROABS 12.1*  HGB 14.1  HCT 44.1  MCV 88.2  PLT 348   Cardiac Enzymes: No results for input(s): CKTOTAL, CKMB, CKMBINDEX, TROPONINI in the last 168 hours. BNP: Invalid input(s): POCBNP CBG: No results for input(s): GLUCAP in the last 168 hours.  If 7PM-7AM, please contact night-coverage [email or web address] Password [facility] 12/26/2014, 7:39 AM

## 2014-12-26 NOTE — ED Provider Notes (Signed)
  Physical Exam  BP 167/123 mmHg  Pulse 122  Temp(Src) 97.6 F (36.4 C) (Oral)  Resp 20  SpO2 99%  Physical Exam Physical Exam  Nursing note and vitals reviewed. Constitutional: She is oriented to person, place, and time. She appears well-developed and well-nourished. No distress.  HENT:  Head: Normocephalic and atraumatic.  Eyes: Conjunctivae normal and EOM are normal. Pupils are equal, round, and reactive to light. No scleral icterus.  Neck: Normal range of motion.  Cardiovascular: Normal rate, regular rhythm and normal heart sounds.  Exam reveals no gallop and no friction rub.   No murmur heard. Pulmonary/Chest: Effort normal and breath sounds normal. No respiratory distress.  Abdominal: Soft. Bowel sounds are normal. She exhibits no distension and no mass. There is no tenderness. There is no guarding.  Neurological: She is alert and oriented to person, place, and time.  Skin: Skin is warm and dry. She is not diaphoretic.     ED Course  Procedures  MDM 6:16 AM BP 167/123 mmHg  Pulse 122  Temp(Src) 97.6 F (36.4 C) (Oral)  Resp 20  SpO2 99% Assumed care of the patient from NP [facility]. Patient has a history of Crohn's disease. AS if she had a flareup over the Christmas holiday. However, it was not severe and she is able to manage without an ER visit. She is followed by Dr. Zenovia Jarred and is on Entocort and infliximab. She's been taking her medications as directed. She states that over the last few days she has had worsening right-sided abdominal pain which she describes as colicky and severe "like a wrench is turning on her abdomen." She has not had a bowel movement since Tuesday, 4 days ago. She has had intractable vomiting at home, which brought her to the emergency department. Patient also recently had community-acquired pneumonia with a pleural effusion requiring drain last month. She has no complaints of chest pain, shortness of breath. Her chest x-ray done today shows  resolving effusion. She denies fevers, chills, hematemesis, or hematochezia. She states that her pain is returning somewhat. I have ordered her 1 dose of dilated as requested by the patient will be available to her. Awaiting CT scan of the abdomen.   7:12 AM BP 159/117 mmHg  Pulse 96  Temp(Src) 97.6 F (36.4 C) (Oral)  Resp 18  SpO2 97% The patient with acute Crohn's flare in the distal jejunum. She will be given IV prednisone here in the emergency department. She is on maximum therapy for her Chron's. She will need admission.   8:03 AM BP 149/107 mmHg  Pulse 90  Temp(Src) 97.6 F (36.4 C) (Oral)  Resp 18  SpO2 94% I have spoken with Dr. Charlies Silvers who will admit the patient for acute chron's flair. I have placed a consult GI per request of Dr. Charlies Silvers.  Margarita Mail, PA-C 12/30/14 [place], MD 01/04/15 [phone]

## 2014-12-26 NOTE — ED Provider Notes (Signed)
CSN: [id number]     Arrival date & time 12/26/14  0351 History   First MD Initiated Contact with Patient 12/26/14 [phone]     Chief Complaint  Patient presents with  . Abdominal Pain  . Emesis     (Consider location/radiation/quality/duration/timing/severity/associated sxs/prior Treatment) HPI Comments: Hx of Crohn's dx now with increased pain nausea chills   Patient is a 44 y.o. female presenting with abdominal pain and vomiting. The history is provided by the patient.  Abdominal Pain Pain location:  RLQ Pain quality: aching, bloating, cramping and fullness   Pain severity:  Severe Onset quality:  Gradual Timing:  Constant Progression:  Worsening Chronicity:  Recurrent Relieved by:  Nothing Associated symptoms: constipation, nausea and vomiting   Associated symptoms: no chest pain, no cough, no diarrhea, no dysuria and no shortness of breath   Emesis Associated symptoms: abdominal pain   Associated symptoms: no diarrhea     Past Medical History  Diagnosis Date  . Gallbladder polyp   . Anxiety   . Colon polyps 05/2014    TUBULAR ADENOMA AND HYPERPLASTIC POLYP.  . Crohn's disease   . Tubular adenoma of colon    Past Surgical History  Procedure Laterality Date  . Other surgical history      heel surgery  . Wisdom teeth extracted    . Right heel surgery      with plates and screws  . Colonoscopy w/ biopsies  2015   Family History  Problem Relation Age of Onset  . Colon cancer Maternal Grandmother 3  . Esophageal cancer Neg Hx   . Stomach cancer Neg Hx   . Rectal cancer Neg Hx   . Pancreatic cancer Neg Hx    History  Substance Use Topics  . Smoking status: Never Smoker   . Smokeless tobacco: Never Used  . Alcohol Use: Yes     Comment: 3 times per week   OB History    No data available     Review of Systems  Respiratory: Negative for cough and shortness of breath.   Cardiovascular: Negative for chest pain.  Gastrointestinal: Positive for nausea, vomiting,  abdominal pain, constipation and abdominal distention. Negative for diarrhea, blood in stool and anal bleeding.  Genitourinary: Negative for dysuria.  All other systems reviewed and are negative.     Allergies  Review of patient's allergies indicates no known allergies.  Home Medications   Prior to Admission medications   Medication Sig Start Date End Date Taking? Authorizing Provider  budesonide (ENTOCORT EC) 3 MG 24 hr capsule Take 3 capsules (9 mg total) by mouth daily. 10/08/14  Yes Jerene Bears, MD  ondansetron (ZOFRAN) 4 MG tablet Take one po every 6 hours prn nausea Patient taking differently: Take 4 mg by mouth every 8 (eight) hours as needed for nausea or vomiting. Take one po every 6 hours prn nausea 12/17/14  Yes Lafayette Dragon, MD  predniSONE (DELTASONE) 10 MG tablet Take 10 mg by mouth daily with breakfast.  12/17/14  Yes Historical Provider, MD  dicyclomine (BENTYL) 10 MG capsule Take 1 by mouth 3-4 times daily as needed for cramping Patient taking differently: Take 10 mg by mouth 3 (three) times daily as needed. For stomach cramps 12/23/14   Jerene Bears, MD  inFLIXimab (REMICADE) 100 MG injection Inject into the vein.    Historical Provider, MD  traMADol (ULTRAM) 50 MG tablet Take 1 tablet (50 mg total) by mouth every 6 (six) hours as needed.  Patient taking differently: Take 50 mg by mouth every 6 (six) hours as needed for moderate pain.  12/23/14   Jerene Bears, MD   BP 140/93 mmHg  Pulse 112  Temp(Src) 98 F (36.7 C) (Oral)  Resp 16  Ht 5' 7.5" (1.715 m)  Wt 138 lb (62.596 kg)  BMI 21.28 kg/m2  SpO2 100% Physical Exam  Constitutional: She appears well-developed and well-nourished.  HENT:  Head: Normocephalic.  Eyes: Pupils are equal, round, and reactive to light.  Neck: Normal range of motion.  Cardiovascular: Normal rate and regular rhythm.   Pulmonary/Chest: Effort normal and breath sounds normal.  Abdominal: Soft. She exhibits distension. Bowel sounds are  decreased. There is tenderness in the right upper quadrant and right lower quadrant. There is no rebound and no guarding.  Musculoskeletal: Normal range of motion.  Neurological: She is alert.  Skin: Skin is warm.  Nursing note and vitals reviewed.   ED Course  Procedures (including critical care time) Labs Review Labs Reviewed  CBC WITH DIFFERENTIAL - Abnormal; Notable for the following:    WBC 15.7 (*)    Neutro Abs 12.1 (*)    Monocytes Absolute 1.4 (*)    All other components within normal limits  COMPREHENSIVE METABOLIC PANEL - Abnormal; Notable for the following:    Glucose, Bld 107 (*)    Total Protein 8.7 (*)    All other components within normal limits  URINALYSIS, ROUTINE W REFLEX MICROSCOPIC - Abnormal; Notable for the following:    APPearance CLOUDY (*)    Hgb urine dipstick LARGE (*)    Leukocytes, UA SMALL (*)    All other components within normal limits  URINE MICROSCOPIC-ADD ON - Abnormal; Notable for the following:    Squamous Epithelial / LPF FEW (*)    All other components within normal limits  COMPREHENSIVE METABOLIC PANEL - Abnormal; Notable for the following:    Glucose, Bld 123 (*)    All other components within normal limits  CBC WITH DIFFERENTIAL - Abnormal; Notable for the following:    WBC 11.3 (*)    Neutrophils Relative % 90 (*)    Neutro Abs 10.1 (*)    Lymphocytes Relative 8 (*)    Monocytes Relative 2 (*)    All other components within normal limits  LIPASE, BLOOD  MAGNESIUM  PHOSPHORUS  APTT  PROTIME-INR  TSH  COMPREHENSIVE METABOLIC PANEL  CBC  POC URINE PREG, ED    Imaging Review Dg Chest 2 View  12/26/2014   CLINICAL DATA:  Shortness of breath. Right lower quadrant abdominal pain for a few days with emesis. History of Crohn's. Nonsmoker.  EXAM: CHEST  2 VIEW  COMPARISON:  CT chest 12/21/2014.  Chest 11/03/2014.  FINDINGS: Coarse nodular interstitial infiltrates in the mid lungs and apices bilaterally. Bilateral hilar retraction.  Density of infiltrates is less prominent on prior study. Similar changes at been present dating back to 11/04/2012. Likely this represents chronic lung disease. Suggest sarcoidosis or sequela of old infection. No superimposed infiltrates or edema. Normal heart size and pulmonary vascularity. No blunting of costophrenic angles. No pneumothorax.  IMPRESSION: Bilateral mid and upper lung infiltrates appear chronic. No evidence of superimposed active lung disease.   Electronically Signed   By: Lucienne Capers M.D.   On: 12/26/2014 05:58   Ct Abdomen Pelvis W Contrast  12/26/2014   CLINICAL DATA:  Right lower quadrant abdominal pain for a few days. Emesis. History of Crohn's disease. Vomiting 7 times over  the past 24 hours. White cell count 15.7. Negative pregnancy test.  EXAM: CT ABDOMEN AND PELVIS WITH CONTRAST  TECHNIQUE: Multidetector CT imaging of the abdomen and pelvis was performed using the standard protocol following bolus administration of intravenous contrast.  CONTRAST:  4mL OMNIPAQUE IOHEXOL 300 MG/ML SOLN, 145mL OMNIPAQUE IOHEXOL 300 MG/ML SOLN  COMPARISON:  09/22/2014  FINDINGS: Lung bases are clear.  Bilateral breast implants.  Mild diffuse fatty infiltration of the liver. The gallbladder, spleen, pancreas, kidneys, abdominal aorta, inferior vena cava, and retroperitoneal lymph nodes are unremarkable. Right adrenal gland nodule measures 2.4 by 1.7 cm, unchanged since prior study. Stomach appears normal. Multiple dilated fluid-filled bowel loops involving mid and distal jejunum with diffuse wall thickening and mesenteric edema. Given history of Crohn's disease, this likely represents active inflammation. Associated bowel obstruction is not excluded. Proximal and distal bowel are decompressed. Transition zone appears to be in the right lower pelvis posteriorly wound and is associated with and area of narrowed bowel with thickened edematous wall and hyperemic. Stool-filled colon is decompressed. No free  air in the abdomen.  Pelvis: Small amount of free fluid in the pelvis is likely reactive. Bladder wall is not thickened. Uterus and ovaries are not enlarged. No pelvic mass or lymphadenopathy. Normal alignment of the lumbar spine. No destructive bone lesions.  IMPRESSION: Fluid filled dilated and thick-walled small bowel in the mid distal jejunum with transition zone in the right pelvis corresponding to a narrowed inflamed bowel loop. Changes likely represent active inflammation due to Crohn's disease. Small amount of free fluid in the pelvis. No abscess identified. Unchanged appearance of right adrenal gland nodule. Mild fatty infiltration of the liver.   Electronically Signed   By: Lucienne Capers M.D.   On: 12/26/2014 07:02     EKG Interpretation None      MDM   Final diagnoses:  SOB (shortness of breath)  Crohn disease         Garald Balding, NP 12/26/14 [place], MD 12/26/14 [phone]

## 2014-12-26 NOTE — ED Notes (Signed)
Report given to Paula, RN.

## 2014-12-26 NOTE — Consult Note (Signed)
Consultation  Referring Provider:  [facility]    Primary Care Physician:  Rexene Edison, NP Primary Gastroenterologist:  Zenovia Jarred MD      Reason for Consultation: Crohn's             HPI:   Meghan Charles is a 44 y.o. female followed by [place] for Crohn's enteritis diagnosed in June 2015 on capsule study. Initially treated with prednisone then changed to Entocort with plans to start Remicade. Patient got first dose of Remicade in September. Just before 2nd dose was due patient developed bilateral PNA and a left sided exudative pleural effusion. Remicade was put on hold. She has been followed closely by pulmonary (last visit 12/21/14). Recent chest CTscan revealed resolution of effusion but persistent bilateral opacities. Remicade still on hold, patient tells me she is for repeat chest imaging in a few weeks.   Patient has remained on 9mg  of Entocort daily. Around Christmas she began having recurrent mid abdominal pain. Over last couple of days pain has escalated and she has developed nausea, vomiting and obstipation.No arthralgias.  Prednisone 10mg  daily was added to regimen a couple of days ago (?by our office). WBC 15.7 with left shift in ED this am. CTscan reveals fluid filled dilated and thick-walled small bowel in the mid distal jejunum with transition zone in the right pelvis corresponding to a narrowed inflamed bowel loop. She got a dose of Solumedrol in ED.    Past Medical History  Diagnosis Date  . Gallbladder polyp   . Anxiety   . Colon polyps 05/2014    TUBULAR ADENOMA AND HYPERPLASTIC POLYP.  . Crohn's disease   . Tubular adenoma of colon     Past Surgical History  Procedure Laterality Date  . Other surgical history      heel surgery  . Wisdom teeth extracted    . Right heel surgery      with plates and screws  . Colonoscopy w/ biopsies  2015    Family History  Problem Relation Age of Onset  . Colon cancer Maternal Grandmother 33  . Esophageal cancer Neg Hx    . Stomach cancer Neg Hx   . Rectal cancer Neg Hx   . Pancreatic cancer Neg Hx      History  Substance Use Topics  . Smoking status: Never Smoker   . Smokeless tobacco: Never Used  . Alcohol Use: Yes     Comment: 3 times per week    Prior to Admission medications   Medication Sig Start Date End Date Taking? Authorizing Provider  budesonide (ENTOCORT EC) 3 MG 24 hr capsule Take 3 capsules (9 mg total) by mouth daily. 10/08/14  Yes Jerene Bears, MD  ondansetron (ZOFRAN) 4 MG tablet Take one po every 6 hours prn nausea Patient taking differently: Take 4 mg by mouth every 8 (eight) hours as needed for nausea or vomiting. Take one po every 6 hours prn nausea 12/17/14  Yes Lafayette Dragon, MD  predniSONE (DELTASONE) 10 MG tablet Take 10 mg by mouth daily with breakfast.  12/17/14  Yes Historical Provider, MD  dicyclomine (BENTYL) 10 MG capsule Take 1 by mouth 3-4 times daily as needed for cramping Patient taking differently: Take 10 mg by mouth 3 (three) times daily as needed. For stomach cramps 12/23/14   Jerene Bears, MD  inFLIXimab (REMICADE) 100 MG injection Inject into the vein.    Historical Provider, MD  traMADol (ULTRAM) 50 MG tablet Take 1 tablet (  50 mg total) by mouth every 6 (six) hours as needed. Patient taking differently: Take 50 mg by mouth every 6 (six) hours as needed for moderate pain.  12/23/14   Jerene Bears, MD    Current Facility-Administered Medications  Medication Dose Route Frequency Provider Last Rate Last Dose  . 0.9 %  sodium chloride infusion   Intravenous Continuous Robbie Lis, MD 75 mL/hr at 12/26/14 0901    . acetaminophen (TYLENOL) tablet 650 mg  650 mg Oral Q6H PRN Robbie Lis, MD       Or  . acetaminophen (TYLENOL) suppository 650 mg  650 mg Rectal Q6H PRN Robbie Lis, MD      . budesonide (ENTOCORT EC) 24 hr capsule 9 mg  9 mg Oral Daily Robbie Lis, MD      . dicyclomine (BENTYL) capsule 10 mg  10 mg Oral TID PRN Robbie Lis, MD      .  hydrALAZINE (APRESOLINE) injection 5 mg  5 mg Intravenous Q6H PRN Robbie Lis, MD   5 mg at 12/26/14 0753  . morphine 2 MG/ML injection 1 mg  1 mg Intravenous Q2H PRN Robbie Lis, MD   1 mg at 12/26/14 0906  . ondansetron (ZOFRAN) tablet 4 mg  4 mg Oral Q6H PRN Robbie Lis, MD       Or  . ondansetron [facility]) injection 4 mg  4 mg Intravenous Q6H PRN Robbie Lis, MD        Allergies as of 12/26/2014  . (No Known Allergies)    Review of Systems:    All systems reviewed and negative except where noted in HPI.    Physical Exam:  Vital signs in last 24 hours: Temp:  [97.6 F (36.4 C)-98.2 F (36.8 C)] 98.2 F (36.8 C) (01/09 0834) Pulse Rate:  [90-122] 98 (01/09 0834) Resp:  [18-20] 18 (01/09 0834) BP: (137-167)/(91-123) 153/103 mmHg (01/09 0834) SpO2:  [94 %-99 %] 97 % (01/09 0834)   General:   Pleasant white female in NAD Head:  Normocephalic and atraumatic. Eyes:   No icterus.   Conjunctiva pink. Ears:  Normal auditory acuity. Neck:  Supple; no masses felt Lungs:  Respirations even and unlabored. Lungs clear to auscultation bilaterally.   No wheezes, crackles, or rhonchi.  Heart:  Regular rate and rhythm Abdomen:  Soft, nondistended, mild mid abdominal tenderness. Normal bowel sounds. No appreciable masses or hepatomegaly.  Rectal:  Not performed.  Msk:  Symmetrical without gross deformities.  Extremities:  Without edema. Neurologic:  Alert and  oriented x4;  grossly normal neurologically. Skin:  Intact without significant lesions or rashes. Cervical Nodes:  No significant cervical adenopathy. Psych:  Alert and cooperative. Normal affect.  LAB RESULTS:  Recent Labs  12/26/14 0508 12/26/14 0936  WBC 15.7* 11.3*  HGB 14.1 13.1  HCT 44.1 40.5  PLT 348 342   BMET  Recent Labs  12/26/14 0508  NA 135  K 4.0  CL 102  CO2 25  GLUCOSE 107*  BUN 12  CREATININE 0.73  CALCIUM 9.8   LFT  Recent Labs  12/26/14 0508  PROT 8.7*  ALBUMIN 4.4  AST 33    ALT 18  ALKPHOS 66  BILITOT 1.0    STUDIES: Dg Chest 2 View  12/26/2014   CLINICAL DATA:  Shortness of breath. Right lower quadrant abdominal pain for a few days with emesis. History of Crohn's. Nonsmoker.  EXAM: CHEST  2 VIEW  COMPARISON:  CT chest 12/21/2014.  Chest 11/03/2014.  FINDINGS: Coarse nodular interstitial infiltrates in the mid lungs and apices bilaterally. Bilateral hilar retraction. Density of infiltrates is less prominent on prior study. Similar changes at been present dating back to 11/04/2012. Likely this represents chronic lung disease. Suggest sarcoidosis or sequela of old infection. No superimposed infiltrates or edema. Normal heart size and pulmonary vascularity. No blunting of costophrenic angles. No pneumothorax.  IMPRESSION: Bilateral mid and upper lung infiltrates appear chronic. No evidence of superimposed active lung disease.   Electronically Signed   By: Lucienne Capers M.D.   On: 12/26/2014 05:58   Ct Abdomen Pelvis W Contrast  12/26/2014   CLINICAL DATA:  Right lower quadrant abdominal pain for a few days. Emesis. History of Crohn's disease. Vomiting 7 times over the past 24 hours. White cell count 15.7. Negative pregnancy test.  EXAM: CT ABDOMEN AND PELVIS WITH CONTRAST  TECHNIQUE: Multidetector CT imaging of the abdomen and pelvis was performed using the standard protocol following bolus administration of intravenous contrast.  CONTRAST:  93mL OMNIPAQUE IOHEXOL 300 MG/ML SOLN, 155mL OMNIPAQUE IOHEXOL 300 MG/ML SOLN  COMPARISON:  09/22/2014  FINDINGS: Lung bases are clear.  Bilateral breast implants.  Mild diffuse fatty infiltration of the liver. The gallbladder, spleen, pancreas, kidneys, abdominal aorta, inferior vena cava, and retroperitoneal lymph nodes are unremarkable. Right adrenal gland nodule measures 2.4 by 1.7 cm, unchanged since prior study. Stomach appears normal. Multiple dilated fluid-filled bowel loops involving mid and distal jejunum with diffuse wall  thickening and mesenteric edema. Given history of Crohn's disease, this likely represents active inflammation. Associated bowel obstruction is not excluded. Proximal and distal bowel are decompressed. Transition zone appears to be in the right lower pelvis posteriorly wound and is associated with and area of narrowed bowel with thickened edematous wall and hyperemic. Stool-filled colon is decompressed. No free air in the abdomen.  Pelvis: Small amount of free fluid in the pelvis is likely reactive. Bladder wall is not thickened. Uterus and ovaries are not enlarged. No pelvic mass or lymphadenopathy. Normal alignment of the lumbar spine. No destructive bone lesions.  IMPRESSION: Fluid filled dilated and thick-walled small bowel in the mid distal jejunum with transition zone in the right pelvis corresponding to a narrowed inflamed bowel loop. Changes likely represent active inflammation due to Crohn's disease. Small amount of free fluid in the pelvis. No abscess identified. Unchanged appearance of right adrenal gland nodule. Mild fatty infiltration of the liver.   Electronically Signed   By: Lucienne Capers M.D.   On: 12/26/2014 07:02      Impression / Plan:   44 year old female with Crohn's enteritis, maintained on Entocort. Got one dose of Remicade and developed bilateral PNA / left sided effusion just prior to receiving second dose. Patient wants to try Remicade again time but at this point pulmonary hasn't approved this. Patient currently admitted with pain and obstructive symptoms. CTscan suggests active enteritis and possible SBO. For now will treat with IV steroids, supportive care. If recurrent vomiting she may need NGT for decompression.   Thanks   LOS: 0 days   Tye Savoy  12/26/2014, 10:02 AM     Attending physician's note   I have taken a history, examined the patient and reviewed the chart. I agree with the Advanced Practitioner's note, impression and recommendations. Crohn's  enteritis flare with abdominal pain and partial SBO. Sips of clear liquids and IV Solumedrol for now.   Ladene Artist, MD  FACG

## 2014-12-26 NOTE — ED Notes (Signed)
NP Carolanne Grumbling at bedside.

## 2014-12-26 NOTE — ED Notes (Signed)
Pt from home c/o RLQ abdominal pain x few days with emesis. Hx of crohns. Patient reports she vomited about 7 times in the past 24 hours. Was prescribed doxycycline and Zofran on 1/6 by calling PCP office. Last BM was on Tuesday.

## 2014-12-27 DIAGNOSIS — K5090 Crohn's disease, unspecified, without complications: Secondary | ICD-10-CM | POA: Insufficient documentation

## 2014-12-27 LAB — CBC
HEMATOCRIT: 38.2 % (ref 36.0–46.0)
HEMOGLOBIN: 12.5 g/dL (ref 12.0–15.0)
MCH: 29.1 pg (ref 26.0–34.0)
MCHC: 32.7 g/dL (ref 30.0–36.0)
MCV: 89 fL (ref 78.0–100.0)
Platelets: 316 10*3/uL (ref 150–400)
RBC: 4.29 MIL/uL (ref 3.87–5.11)
RDW: 13.7 % (ref 11.5–15.5)
WBC: 8.1 10*3/uL (ref 4.0–10.5)

## 2014-12-27 LAB — COMPREHENSIVE METABOLIC PANEL
ALBUMIN: 3.7 g/dL (ref 3.5–5.2)
ALT: 11 U/L (ref 0–35)
AST: 12 U/L (ref 0–37)
Alkaline Phosphatase: 50 U/L (ref 39–117)
Anion gap: 6 (ref 5–15)
BUN: 11 mg/dL (ref 6–23)
CHLORIDE: 107 meq/L (ref 96–112)
CO2: 25 mmol/L (ref 19–32)
Calcium: 9.1 mg/dL (ref 8.4–10.5)
Creatinine, Ser: 0.72 mg/dL (ref 0.50–1.10)
GFR calc Af Amer: >90 mL/min (ref >90)
Glucose, Bld: 106 mg/dL — ABNORMAL HIGH (ref 70–99)
POTASSIUM: 4.3 mmol/L (ref 3.5–5.1)
SODIUM: 138 mmol/L (ref 135–145)
Total Bilirubin: 0.8 mg/dL (ref 0.3–1.2)
Total Protein: 7 g/dL (ref 6.0–8.3)

## 2014-12-27 LAB — GLUCOSE, CAPILLARY: Glucose-Capillary: 93 mg/dL (ref 70–99)

## 2014-12-27 NOTE — Progress Notes (Addendum)
Progress Note   Subjective  Abdominal pain has improved. Tolerating clear liquids   Objective  Vital signs in last 24 hours: Temp:  [98 F (36.7 C)-98.6 F (37 C)] 98.6 F (37 C) (01/10 0620) Pulse Rate:  [97-112] 99 (01/10 0620) Resp:  [16] 16 (01/10 0620) BP: (128-143)/(91-108) 132/94 mmHg (01/10 0620) SpO2:  [98 %-100 %] 99 % (01/10 0620) Weight:  [136 lb 14.4 oz (62.097 kg)] 136 lb 14.4 oz (62.097 kg) (01/10 0500) Last BM Date: 12/22/14   General:   Alert, well-developed, in NAD Heart:  Regular rate and rhythm; no murmurs Chest: CTA Abdomen:  Soft, nontender and minimally distended. Hypoactive bowel sounds, without guarding, and without rebound.   Extremities:  Without edema. Neurologic:  Alert and  oriented x4;  grossly normal neurologically. Psych:  Alert and cooperative. Normal mood and affect.  Intake/Output from previous day: 01/09 0701 - 01/10 0700 In: 2013.8 [P.O.:440; I.V.:1573.8] Out: 1440 [Urine:1440] Intake/Output this shift:    Lab Results:  Recent Labs  12/26/14 0508 12/26/14 0936 12/27/14 0535  WBC 15.7* 11.3* 8.1  HGB 14.1 13.1 12.5  HCT 44.1 40.5 38.2  PLT 348 342 316   BMET  Recent Labs  12/26/14 0508 12/26/14 0936 12/27/14 0535  NA 135 136 138  K 4.0 3.7 4.3  CL 102 101 107  CO2 25 27 25   GLUCOSE 107* 123* 106*  BUN 12 12 11   CREATININE 0.73 0.58 0.72  CALCIUM 9.8 9.5 9.1   LFT  Recent Labs  12/27/14 0535  PROT 7.0  ALBUMIN 3.7  AST 12  ALT 11  ALKPHOS 50  BILITOT 0.8   PT/INR  Recent Labs  12/26/14 0936  LABPROT 12.3  INR 0.90   Hepatitis Panel No results for input(s): HEPBSAG, HCVAB, HEPAIGM, HEPBIGM in the last 72 hours.  Studies/Results: Dg Chest 2 View  12/26/2014   CLINICAL DATA:  Shortness of breath. Right lower quadrant abdominal pain for a few days with emesis. History of Crohn's. Nonsmoker.  EXAM: CHEST  2 VIEW  COMPARISON:  CT chest 12/21/2014.  Chest 11/03/2014.  FINDINGS: Coarse nodular  interstitial infiltrates in the mid lungs and apices bilaterally. Bilateral hilar retraction. Density of infiltrates is less prominent on prior study. Similar changes at been present dating back to 11/04/2012. Likely this represents chronic lung disease. Suggest sarcoidosis or sequela of old infection. No superimposed infiltrates or edema. Normal heart size and pulmonary vascularity. No blunting of costophrenic angles. No pneumothorax.  IMPRESSION: Bilateral mid and upper lung infiltrates appear chronic. No evidence of superimposed active lung disease.   Electronically Signed   By: Lucienne Capers M.D.   On: 12/26/2014 05:58   Ct Abdomen Pelvis W Contrast  12/26/2014   CLINICAL DATA:  Right lower quadrant abdominal pain for a few days. Emesis. History of Crohn's disease. Vomiting 7 times over the past 24 hours. White cell count 15.7. Negative pregnancy test.  EXAM: CT ABDOMEN AND PELVIS WITH CONTRAST  TECHNIQUE: Multidetector CT imaging of the abdomen and pelvis was performed using the standard protocol following bolus administration of intravenous contrast.  CONTRAST:  58mL OMNIPAQUE IOHEXOL 300 MG/ML SOLN, 155mL OMNIPAQUE IOHEXOL 300 MG/ML SOLN  COMPARISON:  09/22/2014  FINDINGS: Lung bases are clear.  Bilateral breast implants.  Mild diffuse fatty infiltration of the liver. The gallbladder, spleen, pancreas, kidneys, abdominal aorta, inferior vena cava, and retroperitoneal lymph nodes are unremarkable. Right adrenal gland nodule measures 2.4 by 1.7 cm, unchanged since prior study. Stomach  appears normal. Multiple dilated fluid-filled bowel loops involving mid and distal jejunum with diffuse wall thickening and mesenteric edema. Given history of Crohn's disease, this likely represents active inflammation. Associated bowel obstruction is not excluded. Proximal and distal bowel are decompressed. Transition zone appears to be in the right lower pelvis posteriorly wound and is associated with and area of narrowed  bowel with thickened edematous wall and hyperemic. Stool-filled colon is decompressed. No free air in the abdomen.  Pelvis: Small amount of free fluid in the pelvis is likely reactive. Bladder wall is not thickened. Uterus and ovaries are not enlarged. No pelvic mass or lymphadenopathy. Normal alignment of the lumbar spine. No destructive bone lesions.  IMPRESSION: Fluid filled dilated and thick-walled small bowel in the mid distal jejunum with transition zone in the right pelvis corresponding to a narrowed inflamed bowel loop. Changes likely represent active inflammation due to Crohn's disease. Small amount of free fluid in the pelvis. No abscess identified. Unchanged appearance of right adrenal gland nodule. Mild fatty infiltration of the liver.   Electronically Signed   By: Lucienne Capers M.D.   On: 12/26/2014 07:02      Assessment & Plan   Crohn's enteritis flare with abdominal pain and partial SBO. Symptoms substantially improved. Advance to full liquids. Continue IV Solumedrol, budesonide, dicyclomine.     LOS: 1 day   Bocephus Cali T. Fuller Plan MD 12/27/2014, 10:05 AM

## 2014-12-27 NOTE — Progress Notes (Signed)
Patient ID: Meghan Charles, female   DOB: 1971-01-16, 44 y.o.   MRN: [id number] [facility] PROGRESS NOTE  Joylene Wescott EKC:[id number] DOB: Sep 13, 1971 DOA: 12/26/2014 PCP: Rexene Edison, NP  Brief narrative:    44 year old female with past medical history of Crohn's disease (has received Remicade in 08/2014), subsequently developed bilateral pneumonia and pleural effusion and for this reason Remicade was placed on hold, she is still on budesonide capsule 9 mg daily, she usually follows with Dr. Zenovia Jarred of GI. She presented to [facility] ED with progressive worsening mid abdominal pain over past few days prior ot this admission with associated nausea and vomiting. Pain was 10/10 in intensity, cramp like. No fevers.  In ED, pt was hemodynamically stable. She continued to have intractable pain even with analgesia given in ED. CT abdomen showed inflammatory changes related to Crohn's disease. Her CXR showed chronic bilateral mid and upper lung infiltrates. She was given solumedrol 125 mg IV in ED. She was admitted for further management.  Assessment/Plan:    Active Problems:   Crohn's disease exacerbation - CT abdomen showed inflammatory changes related to Crohn's disease - solumedrol given in ED 125 mg IV once - she feels better this am and per GI will continue solumedrol 20 mg IV Q 12 hours and budesonide - diet advanced to full liquid  Essential hypertension - hydralazine ordered PRN for BP above 140/90.  Bilateral lung infiltrates / leukocytosis  - recent bilateral PNA after Remicade - has pulmonary follow up for repeat scans coming up - no fever and no cough. She was recently on prednisone so leukocytosis likely elevated from that.   DVT prophylaxis:   SCD's bilaterally    Code Status: Full.  Family Communication:  plan of care discussed with the patient Disposition Plan: Home when stable.   IV access:  Peripheral IV  Procedures and diagnostic studies:    Dg Chest 2 View  12/26/2014 Bilateral mid and upper lung infiltrates appear chronic. No evidence of superimposed active lung disease.     Ct Abdomen Pelvis W Contrast 12/26/2014  Fluid filled dilated and thick-walled small bowel in the mid distal jejunum with transition zone in the right pelvis corresponding to a narrowed inflamed bowel loop. Changes likely represent active inflammation due to Crohn's disease. Small amount of free fluid in the pelvis. No abscess identified. Unchanged appearance of right adrenal gland nodule. Mild fatty infiltration of the liver.    Medical Consultants:  Dr. Lucio Edward, Gastroenterology  Other Consultants:  None   IAnti-Infectives:   None    Leisa Lenz, MD  [facility] Pager [phone]  If 7PM-7AM, please contact night-coverage [email or web address] Password [email or web address] 12/27/2014, 10:36 AM   LOS: 1 day    HPI/Subjective: No acute overnight events.  Objective: Filed Vitals:   12/26/14 1720 12/26/14 2157 12/27/14 0500 12/27/14 0620  BP: 140/93 128/91  132/94  Pulse: 112 103  99  Temp: 98 F (36.7 C) 98.5 F (36.9 C)  98.6 F (37 C)  TempSrc: Oral Oral  Oral  Resp: 16 16  16   Height:      Weight:   62.097 kg (136 lb 14.4 oz)   SpO2: 100% 98%  99%    Intake/Output Summary (Last 24 hours) at 12/27/14 1036 Last data filed at 12/27/14 0600  Gross per 24 hour  Intake 2013.75 ml  Output   1340 ml  Net 673.75 ml    Exam:   General:  Pt is alert, follows commands appropriately, not  in acute distress  Cardiovascular: Regular rate and rhythm, S1/S2, no murmurs  Respiratory: Clear to auscultation bilaterally, no wheezing, no crackles, no rhonchi  Abdomen: Soft, non tender, non distended, bowel sounds present  Extremities: No edema, pulses DP and PT palpable bilaterally  Neuro: Grossly nonfocal  Data Reviewed: Basic Metabolic Panel:  Recent Labs Lab 12/26/14 0508 12/26/14 0936 12/27/14 0535  NA 135 136 138  K 4.0 3.7 4.3  CL 102 101 107  CO2 25  27 25   GLUCOSE 107* 123* 106*  BUN 12 12 11   CREATININE 0.73 0.58 0.72  CALCIUM 9.8 9.5 9.1  MG  --  1.9  --   PHOS  --  3.3  --    Liver Function Tests:  Recent Labs Lab 12/26/14 0508 12/26/14 0936 12/27/14 0535  AST 33 15 12  ALT 18 13 11   ALKPHOS 66 61 50  BILITOT 1.0 0.9 0.8  PROT 8.7* 7.7 7.0  ALBUMIN 4.4 4.1 3.7    Recent Labs Lab 12/26/14 0508  LIPASE 26   No results for input(s): AMMONIA in the last 168 hours. CBC:  Recent Labs Lab 12/26/14 0508 12/26/14 0936 12/27/14 0535  WBC 15.7* 11.3* 8.1  NEUTROABS 12.1* 10.1*  --   HGB 14.1 13.1 12.5  HCT 44.1 40.5 38.2  MCV 88.2 88.4 89.0  PLT 348 342 316   Cardiac Enzymes: No results for input(s): CKTOTAL, CKMB, CKMBINDEX, TROPONINI in the last 168 hours. BNP: Invalid input(s): POCBNP CBG:  Recent Labs Lab 12/27/14 0741  GLUCAP 93    No results found for this or any previous visit (from the past 240 hour(s)).   Scheduled Meds: . budesonide  9 mg Oral Daily  . methylPREDNISolone (SOLU-MEDROL) injection  20 mg Intravenous Q12H   Continuous Infusions:

## 2014-12-28 ENCOUNTER — Encounter (HOSPITAL_COMMUNITY): Payer: Self-pay | Admitting: *Deleted

## 2014-12-28 DIAGNOSIS — K50918 Crohn's disease, unspecified, with other complication: Secondary | ICD-10-CM

## 2014-12-28 LAB — GLUCOSE, CAPILLARY: Glucose-Capillary: 89 mg/dL (ref 70–99)

## 2014-12-28 MED ORDER — PREDNISONE 20 MG PO TABS: 40.0000 mg | ORAL_TABLET | Freq: Every day | ORAL | Status: DC

## 2014-12-28 NOTE — Assessment & Plan Note (Signed)
Resolved on CT  

## 2014-12-28 NOTE — Progress Notes (Signed)
Patient ID: Meghan Charles, female   DOB: [birth year], 44 y.o.   MRN: [id number]  [facility] Progress Note  Subjective: Feels much better- had a BM last night, tolerating full liquids and wants to go home today  Objective:  Vital signs in last 24 hours: Temp:  [98.3 F (36.8 C)-98.5 F (36.9 C)] 98.3 F (36.8 C) (01/11 0640) Pulse Rate:  [89-99] 89 (01/11 0640) Resp:  [16-18] 18 (01/11 0640) BP: (133-137)/(95-97) 137/95 mmHg (01/11 0640) SpO2:  [97 %-100 %] 100 % (01/11 0640) Last BM Date: 12/27/14 General:   Alert,  Well-developed,WF   in NAD Heart:  Regular rate and rhythm; no murmurs Pulm;clear Abdomen:  Soft, mildly tender and nondistended. Normal bowel sounds, without guarding, and without rebound.   Extremities:  Without edema. Neurologic:  Alert and  oriented x4;  grossly normal neurologically. Psych:  Alert and cooperative. Normal mood and affect.  Intake/Output from previous day: 01/10 0701 - 01/11 0700 In: 480 [P.O.:480] Out: -  Intake/Output this shift:    Lab Results:  Recent Labs  12/26/14 0508 12/26/14 0936 12/27/14 0535  WBC 15.7* 11.3* 8.1  HGB 14.1 13.1 12.5  HCT 44.1 40.5 38.2  PLT 348 342 316   BMET  Recent Labs  12/26/14 0508 12/26/14 0936 12/27/14 0535  NA 135 136 138  K 4.0 3.7 4.3  CL 102 101 107  CO2 25 27 25   GLUCOSE 107* 123* 106*  BUN 12 12 11   CREATININE 0.73 0.58 0.72  CALCIUM 9.8 9.5 9.1   LFT  Recent Labs  12/27/14 0535  PROT 7.0  ALBUMIN 3.7  AST 12  ALT 11  ALKPHOS 50  BILITOT 0.8   PT/INR  Recent Labs  12/26/14 0936  LABPROT 12.3  INR 0.90     Assessment / Plan: #1  44 yo female with Crohns enteritis with SBO secondary to  Active inflammation mid and distal jejunum. She is much improved with bowel rest and IV solumedrol. Will advance diet, and plan discharge home later today on prednisone 40 mg daily, hold entocort She has a follow up appt later this week with Dr. Hilarie Fredrickson Active  Problems:   Crohn's colitis   Crohn disease     LOS: 2 days   Amy Esterwood  12/28/2014, 8:58 AM   GI Attending Note  I have personally taken an interval history, reviewed the chart, and examined the patient.  I agree with the extender's note, impression and recommendations.  Sandy Salaam. Deatra Ina, MD, [facility] [phone]

## 2014-12-28 NOTE — Discharge Summary (Signed)
Physician Discharge Summary  Meghan Charles OXB:[id number] DOB: 01/06/1971 DOA: 12/26/2014  PCP: Rexene Edison, NP  Admit date: 12/26/2014 Discharge date: 12/28/2014  Recommendations for Outpatient Follow-up:  1. Take prednisone 40 mg daily. Follow-up with a GI per scheduled appointment.  Discharge Diagnoses:  Active Problems:   Crohn's colitis   Crohn disease    Discharge Condition: stable   Diet recommendation: as tolerated   History of present illness:  44 year old female with past medical history of Crohn's disease (has received Remicade in 08/2014), subsequently developed bilateral pneumonia and pleural effusion and for this reason Remicade was placed on hold, she is still on budesonide capsule 9 mg daily, she usually follows with Dr. Zenovia Jarred of GI. She presented to [facility] ED with progressive worsening mid abdominal pain over past few days prior ot this admission with associated nausea and vomiting. Pain was 10/10 in intensity, cramp like. No fevers.  In ED, pt was hemodynamically stable. She continued to have intractable pain even with analgesia given in ED. CT abdomen showed inflammatory changes related to Crohn's disease. Her CXR showed chronic bilateral mid and upper lung infiltrates. She was given solumedrol 125 mg IV in ED. She was admitted for further management.  Assessment/Plan:    Active Problems:  Crohn's disease exacerbation - CT abdomen showed inflammatory changes related to Crohn's disease - solumedrol given in ED 125 mg IV once - Per GI, patient was placed on Solu-Medrol 20 mg IV every 12 hours. - She feels better this morning. Tolerated by mouth intake. She will continue taking prednisone 40 mg daily. Per GI, budesonide on hold.   Essential hypertension - hydralazine ordered PRN for BP above 140/90 in hospital   Bilateral lung infiltrates / leukocytosis  - recent bilateral PNA after Remicade - has pulmonary follow up for repeat scans coming up - no fever  and no cough. She was recently on prednisone so leukocytosis likely elevated from that.   DVT prophylaxis:   SCD's bilaterally   Code Status: Full.  Family Communication: plan of care discussed with the patient   IV access:  Peripheral IV  Procedures and diagnostic studies:   Dg Chest 2 View 12/26/2014 Bilateral mid and upper lung infiltrates appear chronic. No evidence of superimposed active lung disease.   Ct Abdomen Pelvis W Contrast 12/26/2014 Fluid filled dilated and thick-walled small bowel in the mid distal jejunum with transition zone in the right pelvis corresponding to a narrowed inflamed bowel loop. Changes likely represent active inflammation due to Crohn's disease. Small amount of free fluid in the pelvis. No abscess identified. Unchanged appearance of right adrenal gland nodule. Mild fatty infiltration of the liver.   Medical Consultants:  Dr. Lucio Edward, Gastroenterology  Other Consultants:  None   IAnti-Infectives:   None     Signed:  Leisa Lenz, MD  [facility] 12/28/2014, 11:19 AM  Pager #: [phone]    Discharge Exam: Filed Vitals:   12/28/14 0640  BP: 137/95  Pulse: 89  Temp: 98.3 F (36.8 C)  Resp: 18   Filed Vitals:   12/27/14 0500 12/27/14 0620 12/27/14 1400 12/28/14 0640  BP:  132/94 133/97 137/95  Pulse:  99 99 89  Temp:  98.6 F (37 C) 98.5 F (36.9 C) 98.3 F (36.8 C)  TempSrc:  Oral Oral Oral  Resp:  16 16 18   Height:      Weight: 62.097 kg (136 lb 14.4 oz)     SpO2:  99% 97% 100%  General: Pt is alert, follows commands appropriately, not in acute distress Cardiovascular: Regular rate and rhythm, S1/S2 +, no murmurs Respiratory: Clear to auscultation bilaterally, no wheezing, no crackles, no rhonchi Abdominal: Soft, non tender, non distended, bowel sounds +, no guarding Extremities: no edema, no cyanosis, pulses palpable bilaterally DP and PT Neuro: Grossly nonfocal  Discharge  Instructions  Discharge Instructions    Call MD for:  difficulty breathing, headache or visual disturbances    Complete by:  As directed      Call MD for:  persistant nausea and vomiting    Complete by:  As directed      Call MD for:  redness, tenderness, or signs of infection (pain, swelling, redness, odor or green/yellow discharge around incision site)    Complete by:  As directed      Call MD for:  severe uncontrolled pain    Complete by:  As directed      Diet - low sodium heart healthy    Complete by:  As directed      Discharge instructions    Complete by:  As directed   1. Take prednisone 40 mg daily. Follow-up with a GI per scheduled appointment.     Increase activity slowly    Complete by:  As directed             Medication List    STOP taking these medications        budesonide 3 MG 24 hr capsule  Commonly known as:  ENTOCORT EC     REMICADE 100 MG injection  Generic drug:  inFLIXimab      TAKE these medications        dicyclomine 10 MG capsule  Commonly known as:  BENTYL  Take 1 by mouth 3-4 times daily as needed for cramping     ondansetron 4 MG tablet  Commonly known as:  ZOFRAN  Take one po every 6 hours prn nausea     predniSONE 20 MG tablet  Commonly known as:  DELTASONE  Take 2 tablets (40 mg total) by mouth daily with breakfast.     traMADol 50 MG tablet  Commonly known as:  ULTRAM  Take 1 tablet (50 mg total) by mouth every 6 (six) hours as needed.           Follow-up Information    Follow up with PARRETT,TAMMY, NP. Schedule an appointment as soon as possible for a visit in 1 week.   Specialty:  Nurse Practitioner   Why:  Follow up appt after recent hospitalization   Contact information:   [address] [place] [address] [phone]        The results of significant diagnostics from this hospitalization (including imaging, microbiology, ancillary and laboratory) are listed below for reference.    Significant Diagnostic  Studies: Dg Chest 2 View  12/26/2014   CLINICAL DATA:  Shortness of breath. Right lower quadrant abdominal pain for a few days with emesis. History of Crohn's. Nonsmoker.  EXAM: CHEST  2 VIEW  COMPARISON:  CT chest 12/21/2014.  Chest 11/03/2014.  FINDINGS: Coarse nodular interstitial infiltrates in the mid lungs and apices bilaterally. Bilateral hilar retraction. Density of infiltrates is less prominent on prior study. Similar changes at been present dating back to 11/04/2012. Likely this represents chronic lung disease. Suggest sarcoidosis or sequela of old infection. No superimposed infiltrates or edema. Normal heart size and pulmonary vascularity. No blunting of costophrenic angles. No pneumothorax.  IMPRESSION: Bilateral mid  and upper lung infiltrates appear chronic. No evidence of superimposed active lung disease.   Electronically Signed   By: Lucienne Capers M.D.   On: 12/26/2014 05:58   Dg Abd 1 View  12/16/2014   CLINICAL DATA:  Pain.  EXAM: ABDOMEN - 1 VIEW  COMPARISON:  None.  FINDINGS: Soft tissue structures are unremarkable. Gas pattern is nonspecific. No pathologic calcification. No acute bony abnormality.  IMPRESSION: Negative exam.   Electronically Signed   By: Marcello Moores  Register   On: 12/16/2014 14:09   Ct Chest Wo Contrast  12/21/2014   CLINICAL DATA:  Follow-up. Recent hospitalization in October for bilateral community acquired pneumonia. Continued intermittent shortness of breath with exertion. Left chest pressure. Slight cough.  EXAM: CT CHEST WITHOUT CONTRAST  TECHNIQUE: Multidetector CT imaging of the chest was performed following the standard protocol without IV contrast.  COMPARISON:  Chest x-ray 11/03/2004.  Chest CT 09/24/2014.  FINDINGS: The previously seen large left pleural effusion has nearly completely resolved. Trace left pleural effusion persists. The previously seen bilateral multi focal multi lobar airspace opacities have not significantly changed. Airspace opacities again  noted in both upper lobes and superior segments of both lower lobes. The fact that this is unchanged raises the possibility of a noninfectious inflammatory process. No right pleural effusion.  Scarring again noted in the upper lobes, stable.  Bilateral breast implants noted. Chest wall soft tissues otherwise unremarkable. Heart is normal size. Aorta is normal caliber. No mediastinal, hilar, or axillary adenopathy.  2.4 cm low-density lesion within the right adrenal gland. This is compatible with adenoma. No acute findings in the upper abdomen.  IMPRESSION: Stable airspace opacities within both upper and lower lobes. The stability suggests a noninfectious inflammatory process or postinflammatory scarring.  Near complete resolution of left pleural effusion. Trace left effusion persists.   Electronically Signed   By: Rolm Baptise M.D.   On: 12/21/2014 16:24   Ct Abdomen Pelvis W Contrast  12/26/2014   CLINICAL DATA:  Right lower quadrant abdominal pain for a few days. Emesis. History of Crohn's disease. Vomiting 7 times over the past 24 hours. White cell count 15.7. Negative pregnancy test.  EXAM: CT ABDOMEN AND PELVIS WITH CONTRAST  TECHNIQUE: Multidetector CT imaging of the abdomen and pelvis was performed using the standard protocol following bolus administration of intravenous contrast.  CONTRAST:  59mL OMNIPAQUE IOHEXOL 300 MG/ML SOLN, 13mL OMNIPAQUE IOHEXOL 300 MG/ML SOLN  COMPARISON:  09/22/2014  FINDINGS: Lung bases are clear.  Bilateral breast implants.  Mild diffuse fatty infiltration of the liver. The gallbladder, spleen, pancreas, kidneys, abdominal aorta, inferior vena cava, and retroperitoneal lymph nodes are unremarkable. Right adrenal gland nodule measures 2.4 by 1.7 cm, unchanged since prior study. Stomach appears normal. Multiple dilated fluid-filled bowel loops involving mid and distal jejunum with diffuse wall thickening and mesenteric edema. Given history of Crohn's disease, this likely  represents active inflammation. Associated bowel obstruction is not excluded. Proximal and distal bowel are decompressed. Transition zone appears to be in the right lower pelvis posteriorly wound and is associated with and area of narrowed bowel with thickened edematous wall and hyperemic. Stool-filled colon is decompressed. No free air in the abdomen.  Pelvis: Small amount of free fluid in the pelvis is likely reactive. Bladder wall is not thickened. Uterus and ovaries are not enlarged. No pelvic mass or lymphadenopathy. Normal alignment of the lumbar spine. No destructive bone lesions.  IMPRESSION: Fluid filled dilated and thick-walled small bowel in the mid distal  jejunum with transition zone in the right pelvis corresponding to a narrowed inflamed bowel loop. Changes likely represent active inflammation due to Crohn's disease. Small amount of free fluid in the pelvis. No abscess identified. Unchanged appearance of right adrenal gland nodule. Mild fatty infiltration of the liver.   Electronically Signed   By: Lucienne Capers M.D.   On: 12/26/2014 07:02    Microbiology: No results found for this or any previous visit (from the past 240 hour(s)).   Labs: Basic Metabolic Panel:  Recent Labs Lab 12/26/14 0508 12/26/14 0936 12/27/14 0535  NA 135 136 138  K 4.0 3.7 4.3  CL 102 101 107  CO2 25 27 25   GLUCOSE 107* 123* 106*  BUN 12 12 11   CREATININE 0.73 0.58 0.72  CALCIUM 9.8 9.5 9.1  MG  --  1.9  --   PHOS  --  3.3  --    Liver Function Tests:  Recent Labs Lab 12/26/14 0508 12/26/14 0936 12/27/14 0535  AST 33 15 12  ALT 18 13 11   ALKPHOS 66 61 50  BILITOT 1.0 0.9 0.8  PROT 8.7* 7.7 7.0  ALBUMIN 4.4 4.1 3.7    Recent Labs Lab 12/26/14 0508  LIPASE 26   No results for input(s): AMMONIA in the last 168 hours. CBC:  Recent Labs Lab 12/26/14 0508 12/26/14 0936 12/27/14 0535  WBC 15.7* 11.3* 8.1  NEUTROABS 12.1* 10.1*  --   HGB 14.1 13.1 12.5  HCT 44.1 40.5 38.2  MCV  88.2 88.4 89.0  PLT 348 342 316   Cardiac Enzymes: No results for input(s): CKTOTAL, CKMB, CKMBINDEX, TROPONINI in the last 168 hours. BNP: BNP (last 3 results) No results for input(s): PROBNP in the last 8760 hours. CBG:  Recent Labs Lab 12/27/14 0741 12/28/14 0743  GLUCAP 93 89    Time coordinating discharge: Over 30 minutes

## 2014-12-28 NOTE — Discharge Instructions (Signed)
Crohn Disease  Crohn disease is a long-term (chronic) soreness and redness (inflammation) of the intestines (bowel). It can affect any portion of the digestive tract, from the mouth to the anus. It can also cause problems outside the digestive tract. Crohn disease is closely related to a disease called ulcerative colitis (together, these two diseases are called inflammatory bowel disease).   CAUSES   The cause of Crohn disease is not known. One theory is that, in an easily affected person, the immune system is triggered to attack the body's own digestive tissue. Crohn disease runs in families. It seems to be more common in certain geographic areas and amongst certain races. There are no clear-cut dietary causes.   SYMPTOMS   Crohn disease can cause many different symptoms since it can affect many different parts of the body. Symptoms include:  · Fatigue.  · Weight loss.  · Chronic diarrhea, sometime bloody.  · Abdominal pain and cramps.  · Fever.  · Ulcers or canker sores in the mouth or rectum.  · Anemia (low red blood cells).  · Arthritis, skin problems, and eye problems may occur.  Complications of Crohn disease can include:  · Series of holes (perforation) of the bowel.  · Portions of the intestines sticking to each other (adhesions).  · Obstruction of the bowel.  · Fistula formation, typically in the rectal area but also in other areas. A fistula is an opening between the bowels and the outside, or between the bowels and another organ.  · A painful crack in the mucous membrane of the anus (rectal fissure).  DIAGNOSIS   Your caregiver may suspect Crohn disease based on your symptoms and an exam. Blood tests may confirm that there is a problem. You may be asked to submit a stool specimen for examination. X-rays and CT scans may be necessary. Ultimately, the diagnosis is usually made after a procedure that uses a flexible tube that is inserted via your mouth or your anus. This is done under sedation and is called  either an upper endoscopy or colonoscopy. With these tests, the specialist can take tiny tissue samples and remove them from the inside of the bowel (biopsy). Examination of this biopsy tissue under a microscope can reveal Crohn disease as the cause of your symptoms.  Due to the many different forms that Crohn disease can take, symptoms may be present for several years before a diagnosis is made.  TREATMENT   Medications are often used to decrease inflammation and control the immune system. These include medicines related to aspirin, steroid medications, and newer and stronger medications to slow down the immune system. Some medications may be used as suppositories or enemas. A number of other medications are used or have been studied. Your caregiver will make specific recommendations.  HOME CARE INSTRUCTIONS   · Symptoms such as diarrhea can be controlled with medications. Avoid foods that have a laxative effect such as fresh fruit, vegetables, and dairy products. During flare-ups, you can rest your bowel by refraining from solid foods. Drink clear liquids frequently during the day. (Electrolyte or rehydrating fluids are best. Your caregiver can help you with suggestions.) Drink often to prevent loss of body fluids (dehydration). When diarrhea has cleared, eat small meals and more frequently. Avoid food additives and stimulants such as caffeine (coffee, tea, or chocolate). Enzyme supplements may help if you develop intolerance to a sugar in dairy products (lactose). Ask your caregiver or dietitian about specific dietary instructions.  · Try to   maintain a positive attitude. Learn relaxation techniques such as self-hypnosis, mental imaging, and muscle relaxation.  · If possible, avoid stresses which can aggravate your condition.  · Exercise regularly.  · Follow your diet.  · Always get plenty of rest.  SEEK MEDICAL CARE IF:   · Your symptoms fail to improve after a week or two of new treatment.  · You experience  continued weight loss.  · You have ongoing cramps or loose bowels.  · You develop a new skin rash, skin sores, or eye problems.  SEEK IMMEDIATE MEDICAL CARE IF:   · You have worsening of your symptoms or develop new symptoms.  · You have a fever.  · You develop bloody diarrhea.  · You develop severe abdominal pain.  MAKE SURE YOU:   · Understand these instructions.  · Will watch your condition.  · Will get help right away if you are not doing well or get worse.  Document Released: 09/13/2005 Document Revised: 04/20/2014 Document Reviewed: 08/12/2007  Exit[employer]® Patient Information ©2015 [employer]. This information is not intended to replace advice given to you by your health care provider. Make sure you discuss any questions you have with your health care provider.

## 2014-12-28 NOTE — Assessment & Plan Note (Signed)
Bilateral CAP w/ left effusion in setting of Chron's dz /Remicade tx  Pt is clinically improving  CT showed stable aspdz opacities and resolved effuision  She is 2.5 months out from treatment and clinically  Case was discussed with Dr. Chase Caller , who will discuss with GI  Regarding restarting Remicade as pt is clinically improved  Will follow her cxr on return and consider repeat CT chest in 3 months   Plan  Continue on current regimen  Follow up Dr. Chase Caller in 6 weeks with chest xray  Please contact office for sooner follow up if symptoms do not improve or worsen or seek emergency care

## 2014-12-31 ENCOUNTER — Encounter: Payer: Self-pay | Admitting: Internal Medicine

## 2014-12-31 ENCOUNTER — Ambulatory Visit (INDEPENDENT_AMBULATORY_CARE_PROVIDER_SITE_OTHER): Payer: BLUE CROSS/BLUE SHIELD | Admitting: Internal Medicine

## 2014-12-31 ENCOUNTER — Telehealth: Payer: Self-pay | Admitting: Internal Medicine

## 2014-12-31 VITALS — BP 130/82 | HR 84 | Ht 67.5 in | Wt 136.4 lb

## 2014-12-31 DIAGNOSIS — Z79899 Other long term (current) drug therapy: Secondary | ICD-10-CM

## 2014-12-31 DIAGNOSIS — K50912 Crohn's disease, unspecified, with intestinal obstruction: Secondary | ICD-10-CM

## 2014-12-31 MED ORDER — PREDNISONE 5 MG PO TABS: ORAL_TABLET | ORAL | Status: DC

## 2014-12-31 MED ORDER — PREDNISONE 20 MG PO TABS: ORAL_TABLET | ORAL | Status: DC

## 2014-12-31 MED ORDER — HYDROCODONE-ACETAMINOPHEN 5-325 MG PO TABS: ORAL_TABLET | ORAL | Status: DC

## 2014-12-31 NOTE — Telephone Encounter (Signed)
Pt's mother said she was told by Dr pyrtle she could call and speak directly to him about her daughter. She would like a call from him today if possible.

## 2014-12-31 NOTE — Patient Instructions (Addendum)
Dr Hilarie Fredrickson has advised that you be on a prednisone taper. The taper instructions are as follows: Take 40 mg by mouth once daily x 2 weeks, then Take 35 mg by mouth once daily x 2 weeks Take 30 mg by mouth once daily x 1 week, then Take 25 mg (1by mouth once daily x 1 week, then Take 20 mg by mouth once daily x 1 week, then Take 15 mg by mouth once daily x 1 week, then Take 10 mg (0.5 tablet) by mouth once daily thereafter  Please discontinue Tramadol.  We have given you a prescription for Vicodin to take to your pharmacy.  Please discuss Entyvio with your family. Call us back at [phone] to let us know your decision.  You have been scheduled for your 3rd Twinrix (hepatitis a/b injection) on 01/06/15 @ 9:00 am.  Please follow up in 6 weeks.  We have given you a low residue diet to follow.  CC: Rexene Edison, NP

## 2014-12-31 NOTE — Telephone Encounter (Signed)
Hi Ulice Dash  I looked at her Ct. I have seen a few patients on TNF alpha blockade gteting pneumonia and their CT does not fully resolve or takes foreeer. Suspect due to lack of immunity. I am thinking if you can wait total 3 months after her pneumonia and restart IBD meds will be find. Possible for something other than TNF-alpha?  Thanks  Dr. Brand Males, M.D., [facility].C.P Pulmonary and Critical Care Medicine Staff Physician [facility] Pager: [phone], If no answer or between  15:00h - 7:00h: call [phone]  12/31/2014 1:09 PM

## 2014-12-31 NOTE — Progress Notes (Signed)
Subjective:    Patient ID: Meghan Charles, female    DOB: May 23, 1971, 44 y.o.   MRN: [id number]  HPI Meghan Charles is a 44 yo female with PMH of Crohn's enteritis, complicated in September by bilateral pneumonia with parapneumonic effusion requiring thoracentesis who is seen for follow-up. Unfortunately she was hospitalized with Crohn's flare of partial bowel obstruction and was discharged earlier this week. She called several days after Christmas with abdominal pain, nausea and vomiting and was started on steroids. This eventually led to hospitalization. She feels it may have been secondary to eating a different diet around Christmas time. She reports she ate food she does not normally eat. CT scan performed at the time of recent admission showed a jejunal/proximal ileum thickening consistent with active Crohn's disease. She was taking 6 mg budesonide at the time of her flare and is now taking prednisone 40 mg daily. She reports pain has resolved entirely as of today then yesterday she was still having some right lower and lower abdominal cramping. She's having some loose nonbloody non-melenic stools. She is using tramadol at night to help with abdominal discomfort but this is not helping at all and she reports she is even taking this much as 150 mg. Appetite has been better though she has been hesitant to eat anything other than a soft diet. No further nausea or vomiting this week. She went back to work yesterday.   Review of Systems As per history of present illness, otherwise negative  Current Medications, Allergies, Past Medical History, Past Surgical History, Family History and Social History were reviewed in [employer] record.     Objective:   Physical Exam BP 130/82 mmHg  Pulse 84  Ht 5' 7.5" (1.715 m)  Wt 136 lb 6 oz (61.859 kg)  BMI 21.03 kg/m2 Constitutional: Well-developed and well-nourished. No distress. HEENT: Normocephalic and atraumatic. Oropharynx  is clear and moist. No oropharyngeal exudate. Conjunctivae are normal.  No scleral icterus. Neck: Neck supple. Trachea midline. Cardiovascular: Normal rate, regular rhythm and intact distal pulses. No M/R/G Pulmonary/chest: Effort normal and breath sounds normal. No wheezing, rales or rhonchi. Abdominal: Soft, mild rlq tenderness without rebound or guarding, nondistended. Bowel sounds active throughout. There are no masses palpable. No hepatosplenomegaly. Extremities: no clubbing, cyanosis, or edema Lymphadenopathy: No cervical adenopathy noted. Neurological: Alert and oriented to person place and time. Skin: Skin is warm and dry. No rashes noted. Psychiatric: Normal mood and affect. Behavior is normal.  CBC    Component Value Date/Time   WBC 8.1 12/27/2014 0535   RBC 4.29 12/27/2014 0535   RBC 3.91 11/05/2012 0837   HGB 12.5 12/27/2014 0535   HCT 38.2 12/27/2014 0535   PLT 316 12/27/2014 0535   MCV 89.0 12/27/2014 0535   MCH 29.1 12/27/2014 0535   MCHC 32.7 12/27/2014 0535   RDW 13.7 12/27/2014 0535   LYMPHSABS 0.9 12/26/2014 0936   MONOABS 0.2 12/26/2014 0936   EOSABS 0.0 12/26/2014 0936   BASOSABS 0.0 12/26/2014 0936    CMP     Component Value Date/Time   NA 138 12/27/2014 0535   K 4.3 12/27/2014 0535   CL 107 12/27/2014 0535   CO2 25 12/27/2014 0535   GLUCOSE 106* 12/27/2014 0535   BUN 11 12/27/2014 0535   CREATININE 0.72 12/27/2014 0535   CALCIUM 9.1 12/27/2014 0535   PROT 7.0 12/27/2014 0535   ALBUMIN 3.7 12/27/2014 0535   AST 12 12/27/2014 0535   ALT 11 12/27/2014 0535  ALKPHOS 50 12/27/2014 0535   BILITOT 0.8 12/27/2014 0535   GFRNONAA >90 12/27/2014 0535   GFRAA >90 12/27/2014 0535      CLINICAL DATA:  Right lower quadrant abdominal pain for a few days. Emesis. History of Crohn's disease. Vomiting 7 times over the past 24 hours. White cell count 15.7. Negative pregnancy test.   EXAM: CT ABDOMEN AND PELVIS WITH CONTRAST   TECHNIQUE: Multidetector CT  imaging of the abdomen and pelvis was performed using the standard protocol following bolus administration of intravenous contrast.   CONTRAST:  51mL OMNIPAQUE IOHEXOL 300 MG/ML SOLN, 152mL OMNIPAQUE IOHEXOL 300 MG/ML SOLN   COMPARISON:  09/22/2014   FINDINGS: Lung bases are clear.  Bilateral breast implants.   Mild diffuse fatty infiltration of the liver. The gallbladder, spleen, pancreas, kidneys, abdominal aorta, inferior vena cava, and retroperitoneal lymph nodes are unremarkable. Right adrenal gland nodule measures 2.4 by 1.7 cm, unchanged since prior study. Stomach appears normal. Multiple dilated fluid-filled bowel loops involving mid and distal jejunum with diffuse wall thickening and mesenteric edema. Given history of Crohn's disease, this likely represents active inflammation. Associated bowel obstruction is not excluded. Proximal and distal bowel are decompressed. Transition zone appears to be in the right lower pelvis posteriorly wound and is associated with and area of narrowed bowel with thickened edematous wall and hyperemic. Stool-filled colon is decompressed. No free air in the abdomen.   Pelvis: Small amount of free fluid in the pelvis is likely reactive. Bladder wall is not thickened. Uterus and ovaries are not enlarged. No pelvic mass or lymphadenopathy. Normal alignment of the lumbar spine. No destructive bone lesions.   IMPRESSION: Fluid filled dilated and thick-walled small bowel in the mid distal jejunum with transition zone in the right pelvis corresponding to a narrowed inflamed bowel loop. Changes likely represent active inflammation due to Crohn's disease. Small amount of free fluid in the pelvis. No abscess identified. Unchanged appearance of right adrenal gland nodule. Mild fatty infiltration of the liver.     Electronically Signed   By: Lucienne Capers M.D.   On: 12/26/2014 07:02       Assessment & Plan:  44 yo female with PMH of Crohn's  enteritis, complicated in September by bilateral pneumonia with parapneumonic effusion requiring thoracentesis who is seen for follow-up  1. Crohn's enteritis with pSBO -- she has followed closely with pulmonary and I spoke to Dr. Chase Caller in person today regarding Cherron's situation.  She has active disease requiring steroids and had a flare even on budesonide 6 mg daily. She is responding to prednisone at present. I think she has 2 options, the first being reinitiation of biologic therapy. After discussion with pulmonology he feels waiting 3 months and retrying Biologics is reasonable recommends another class if possible. With this in mind Weyman Rodney is an option. We had long discussion regarding this medication, the risks, benefits and alternatives today. This medication works in a different way by targeting white blood cell trafficking in the gut. The second option would be surgical resection, though she may require a fairly large small bowel resection and would still benefit from biologic therapy thereafter. She would like to discuss these options with her family but is leaning towards trying Entyvio.  This would be with induction 0, 2 and 6 weeks followed by every 8 week infusion. We will taper prednisone very slowly with 40 mg daily 2 weeks and then stepping down by 5 mg every 7 days until she reaches 10 mg daily.  No response to tramadol, DC tramadol. Vicodin 5-3 25 mg 1-2 tabs every 6 hours as needed for pain. I would like her to avoid narcotic pain medication as much as possible. Low residue diet. Office follow-up in 6 weeks, sooner if necessary --Previous testing prior to Remicade induction 4 months ago revealed negative viral hepatitis studies and TB testing  40 min spent today together at least 50% being discussion of Crohn's disease and management with new medication

## 2015-01-01 NOTE — Telephone Encounter (Signed)
With [name]'s permission I spoke with her mother regarding the decision to reinitiate biologic therapy with Remicade versus Entyvio for currently steroid dependent small bowel Crohn's disease We discussed both medications including the risks in great detail including the risk of infection (including reactivation of latent TB and underlying viral hepatitis), hepatotoxicity, leukopenia, pancreatitis, nausea, malignancy (specifically lymphoma), demyelinating disease, and even heart failure. She will talk with Meghan Charles and Meghan Charles will call me back Her mother did echo [name]'s desire to avoid surgery at this time if at all possible

## 2015-01-11 ENCOUNTER — Telehealth: Payer: Self-pay | Admitting: Emergency Medicine

## 2015-01-11 ENCOUNTER — Telehealth: Payer: Self-pay | Admitting: Internal Medicine

## 2015-01-11 ENCOUNTER — Other Ambulatory Visit: Payer: Self-pay

## 2015-01-11 NOTE — Telephone Encounter (Signed)
Called short stay at [facility] short stay and they are to call back to schedule entyvio

## 2015-01-11 NOTE — Telephone Encounter (Signed)
Pt states she is ready to try drug that Dr. Hilarie Fredrickson suggested. Please advise.

## 2015-01-11 NOTE — Telephone Encounter (Signed)
Eroor./..em

## 2015-01-11 NOTE — Telephone Encounter (Signed)
Please initiate induction and maintenance therapy with Entyvio at standard dosing Infusion can be at hospital or [facility]

## 2015-01-12 ENCOUNTER — Other Ambulatory Visit: Payer: Self-pay

## 2015-01-12 DIAGNOSIS — K50912 Crohn's disease, unspecified, with intestinal obstruction: Secondary | ICD-10-CM

## 2015-01-12 NOTE — Telephone Encounter (Signed)
Pt scheduled for Entyvio at [facility] short stay the following dates:  02/02/15@10am  02/16/15@11am  03/16/15@11am  05/11/15@9am   Pt aware of appts.

## 2015-01-14 ENCOUNTER — Other Ambulatory Visit: Payer: Self-pay

## 2015-01-14 ENCOUNTER — Telehealth: Payer: Self-pay

## 2015-01-14 DIAGNOSIS — K50918 Crohn's disease, unspecified, with other complication: Secondary | ICD-10-CM

## 2015-01-14 MED ORDER — HYOSCYAMINE SULFATE 0.125 MG SL SUBL: 0.1250 mg | SUBLINGUAL_TABLET | Freq: Four times a day (QID) | SUBLINGUAL | Status: DC | PRN

## 2015-01-14 NOTE — Telephone Encounter (Signed)
Pt states she is continuing to have abdominal cramping. States she is taking her pain medication and bentyl 3-4 times a day but it is not helping. Is there something else pt can try for the cramping discomfort? Please advise.

## 2015-01-14 NOTE — Telephone Encounter (Signed)
Spoke with pt and she states she just started the Prednisone 25m dose. Script sent to pharmacy for pt.

## 2015-01-14 NOTE — Telephone Encounter (Signed)
Much prednisone and she on? Could try Levsin 0.25 SL q6h prn in place of Bentyl to see if this is more beneficial for her Low residue diet

## 2015-01-15 ENCOUNTER — Ambulatory Visit (INDEPENDENT_AMBULATORY_CARE_PROVIDER_SITE_OTHER): Payer: BLUE CROSS/BLUE SHIELD | Admitting: Gastroenterology

## 2015-01-15 ENCOUNTER — Encounter: Payer: Self-pay | Admitting: Gastroenterology

## 2015-01-15 ENCOUNTER — Ambulatory Visit (INDEPENDENT_AMBULATORY_CARE_PROVIDER_SITE_OTHER)
Admission: RE | Admit: 2015-01-15 | Discharge: 2015-01-15 | Disposition: A | Discharge status: Home / Self Care | Payer: BLUE CROSS/BLUE SHIELD | Source: Ambulatory Visit | Attending: Gastroenterology | Admitting: Gastroenterology

## 2015-01-15 ENCOUNTER — Telehealth: Payer: Self-pay | Admitting: Internal Medicine

## 2015-01-15 VITALS — BP 102/76 | HR 86 | Ht 68.0 in | Wt 135.4 lb

## 2015-01-15 DIAGNOSIS — K50919 Crohn's disease, unspecified, with unspecified complications: Secondary | ICD-10-CM

## 2015-01-15 DIAGNOSIS — R109 Unspecified abdominal pain: Secondary | ICD-10-CM

## 2015-01-15 DIAGNOSIS — R197 Diarrhea, unspecified: Secondary | ICD-10-CM

## 2015-01-15 DIAGNOSIS — R112 Nausea with vomiting, unspecified: Secondary | ICD-10-CM

## 2015-01-15 MED ORDER — ONDANSETRON HCL 4 MG PO TABS: 4.0000 mg | ORAL_TABLET | Freq: Four times a day (QID) | ORAL | Status: DC

## 2015-01-15 NOTE — Patient Instructions (Addendum)
We have sent the following medications to your pharmacy for you to pick up at your convenience: Zofran Please increase prednisone to 40mg  daily  Please go to the basement today get your x-ray   FW:YOVZC Parrett

## 2015-01-15 NOTE — Telephone Encounter (Signed)
Pt states she is still having problems with abdominal pain and cramping. States she tried to eat some noodles last night and still got sick. Pt scheduled to see Alonza Bogus PA today at 1:30pm. Pt aware of appt.

## 2015-01-18 ENCOUNTER — Inpatient Hospital Stay (HOSPITAL_COMMUNITY)
Admission: EM | Admit: 2015-01-18 | Discharge: 2015-01-21 | DRG: 386 | Disposition: A | Discharge status: Home / Self Care | Payer: BLUE CROSS/BLUE SHIELD | Attending: Internal Medicine | Admitting: Internal Medicine

## 2015-01-18 ENCOUNTER — Encounter (HOSPITAL_COMMUNITY): Payer: Self-pay | Admitting: Emergency Medicine

## 2015-01-18 ENCOUNTER — Encounter: Payer: Self-pay | Admitting: Gastroenterology

## 2015-01-18 ENCOUNTER — Telehealth: Payer: Self-pay | Admitting: Internal Medicine

## 2015-01-18 ENCOUNTER — Telehealth: Payer: Self-pay | Admitting: *Deleted

## 2015-01-18 DIAGNOSIS — K5090 Crohn's disease, unspecified, without complications: Principal | ICD-10-CM | POA: Diagnosis present

## 2015-01-18 DIAGNOSIS — Z86010 Personal history of colonic polyps: Secondary | ICD-10-CM

## 2015-01-18 DIAGNOSIS — K560 Paralytic ileus: Secondary | ICD-10-CM | POA: Diagnosis present

## 2015-01-18 DIAGNOSIS — R197 Diarrhea, unspecified: Secondary | ICD-10-CM | POA: Insufficient documentation

## 2015-01-18 DIAGNOSIS — Z8701 Personal history of pneumonia (recurrent): Secondary | ICD-10-CM | POA: Diagnosis not present

## 2015-01-18 DIAGNOSIS — Z79899 Other long term (current) drug therapy: Secondary | ICD-10-CM | POA: Insufficient documentation

## 2015-01-18 DIAGNOSIS — Z7952 Long term (current) use of systemic steroids: Secondary | ICD-10-CM

## 2015-01-18 DIAGNOSIS — K50912 Crohn's disease, unspecified, with intestinal obstruction: Secondary | ICD-10-CM

## 2015-01-18 DIAGNOSIS — R109 Unspecified abdominal pain: Secondary | ICD-10-CM | POA: Diagnosis present

## 2015-01-18 LAB — CBC WITH DIFFERENTIAL/PLATELET
Basophils Absolute: 0 10*3/uL (ref 0.0–0.1)
Basophils Relative: 0 % (ref 0–1)
Eosinophils Absolute: 0 10*3/uL (ref 0.0–0.7)
Eosinophils Relative: 0 % (ref 0–5)
HEMATOCRIT: 44.3 % (ref 36.0–46.0)
HEMOGLOBIN: 15.2 g/dL — AB (ref 12.0–15.0)
LYMPHS ABS: 2.1 10*3/uL (ref 0.7–4.0)
Lymphocytes Relative: 12 % (ref 12–46)
MCH: 30.2 pg (ref 26.0–34.0)
MCHC: 34.3 g/dL (ref 30.0–36.0)
MCV: 88.1 fL (ref 78.0–100.0)
MONO ABS: 1.5 10*3/uL — AB (ref 0.1–1.0)
Monocytes Relative: 9 % (ref 3–12)
NEUTROS ABS: 13.7 10*3/uL — AB (ref 1.7–7.7)
NEUTROS PCT: 79 % — AB (ref 43–77)
PLATELETS: 363 10*3/uL (ref 150–400)
RBC: 5.03 MIL/uL (ref 3.87–5.11)
RDW: 13.2 % (ref 11.5–15.5)
WBC: 17.3 10*3/uL — ABNORMAL HIGH (ref 4.0–10.5)

## 2015-01-18 LAB — COMPREHENSIVE METABOLIC PANEL
ALBUMIN: 3.7 g/dL (ref 3.5–5.2)
ALT: 16 U/L (ref 0–35)
AST: 16 U/L (ref 0–37)
Alkaline Phosphatase: 61 U/L (ref 39–117)
Anion gap: 9 (ref 5–15)
BILIRUBIN TOTAL: 0.5 mg/dL (ref 0.3–1.2)
BUN: 14 mg/dL (ref 6–23)
CHLORIDE: 102 mmol/L (ref 96–112)
CO2: 29 mmol/L (ref 19–32)
CREATININE: 0.85 mg/dL (ref 0.50–1.10)
Calcium: 8.9 mg/dL (ref 8.4–10.5)
GFR calc Af Amer: >90 mL/min (ref >90)
GFR, EST NON AFRICAN AMERICAN: 83 mL/min — AB (ref >90)
Glucose, Bld: 113 mg/dL — ABNORMAL HIGH (ref 70–99)
POTASSIUM: 3.1 mmol/L — AB (ref 3.5–5.1)
Sodium: 140 mmol/L (ref 135–145)
TOTAL PROTEIN: 6.9 g/dL (ref 6.0–8.3)

## 2015-01-18 LAB — URINALYSIS, ROUTINE W REFLEX MICROSCOPIC
GLUCOSE, UA: NEGATIVE mg/dL
HGB URINE DIPSTICK: NEGATIVE
Ketones, ur: NEGATIVE mg/dL
Nitrite: NEGATIVE
Protein, ur: NEGATIVE mg/dL
SPECIFIC GRAVITY, URINE: 1.024 (ref 1.005–1.030)
UROBILINOGEN UA: 1 mg/dL (ref 0.0–1.0)
pH: 6.5 (ref 5.0–8.0)

## 2015-01-18 LAB — URINE MICROSCOPIC-ADD ON

## 2015-01-18 LAB — LIPASE, BLOOD: Lipase: 31 U/L (ref 11–59)

## 2015-01-18 MED ORDER — ACETAMINOPHEN 325 MG PO TABS: 650.0000 mg | ORAL_TABLET | Freq: Four times a day (QID) | ORAL | Status: DC | PRN

## 2015-01-18 MED ORDER — SODIUM CHLORIDE 0.9 % IV BOLUS (SEPSIS)
1000.0000 mL | Freq: Once | INTRAVENOUS | Status: AC
  Administered 2015-01-18: 1000 mL via INTRAVENOUS

## 2015-01-18 MED ORDER — METHYLPREDNISOLONE SODIUM SUCC 125 MG IJ SOLR
125.0000 mg | Freq: Once | INTRAMUSCULAR | Status: AC
  Administered 2015-01-18: 125 mg via INTRAVENOUS
  Filled 2015-01-18: qty 2

## 2015-01-18 MED ORDER — ONDANSETRON HCL 4 MG/2ML IJ SOLN
4.0000 mg | Freq: Once | INTRAMUSCULAR | Status: AC
  Administered 2015-01-18: 4 mg via INTRAVENOUS
  Filled 2015-01-18: qty 2

## 2015-01-18 MED ORDER — MORPHINE SULFATE 4 MG/ML IJ SOLN
4.0000 mg | Freq: Once | INTRAMUSCULAR | Status: AC
  Administered 2015-01-18: 4 mg via INTRAVENOUS
  Filled 2015-01-18: qty 1

## 2015-01-18 MED ORDER — ONDANSETRON HCL 4 MG/2ML IJ SOLN
4.0000 mg | Freq: Four times a day (QID) | INTRAMUSCULAR | Status: DC | PRN
  Administered 2015-01-19 – 2015-01-20 (×2): 4 mg via INTRAVENOUS
  Filled 2015-01-18 (×2): qty 2

## 2015-01-18 MED ORDER — METHYLPREDNISOLONE SODIUM SUCC 125 MG IJ SOLR
60.0000 mg | INTRAMUSCULAR | Status: DC
  Administered 2015-01-19 – 2015-01-21 (×3): 60 mg via INTRAVENOUS
  Filled 2015-01-18 (×4): qty 0.96

## 2015-01-18 MED ORDER — ENOXAPARIN SODIUM 40 MG/0.4ML ~~LOC~~ SOLN
40.0000 mg | SUBCUTANEOUS | Status: DC
  Administered 2015-01-18 – 2015-01-21 (×3): 40 mg via SUBCUTANEOUS
  Filled 2015-01-18 (×4): qty 0.4

## 2015-01-18 MED ORDER — SODIUM CHLORIDE 0.9 % IJ SOLN: 3.0000 mL | INTRAMUSCULAR | Status: DC | PRN

## 2015-01-18 MED ORDER — HYDROMORPHONE HCL 1 MG/ML IJ SOLN
0.5000 mg | INTRAMUSCULAR | Status: DC | PRN
  Administered 2015-01-18 – 2015-01-20 (×5): 0.5 mg via INTRAVENOUS
  Filled 2015-01-18 (×5): qty 1

## 2015-01-18 MED ORDER — POTASSIUM CHLORIDE IN NACL 20-0.45 MEQ/L-% IV SOLN
INTRAVENOUS | Status: DC
  Administered 2015-01-18 – 2015-01-20 (×4): via INTRAVENOUS
  Filled 2015-01-18 (×8): qty 1000

## 2015-01-18 MED ORDER — ENOXAPARIN SODIUM 60 MG/0.6ML ~~LOC~~ SOLN: 1.0000 mg/kg | Freq: Two times a day (BID) | SUBCUTANEOUS | Status: DC

## 2015-01-18 MED ORDER — ACETAMINOPHEN 650 MG RE SUPP: 650.0000 mg | Freq: Four times a day (QID) | RECTAL | Status: DC | PRN

## 2015-01-18 MED ORDER — ONDANSETRON HCL 4 MG PO TABS: 4.0000 mg | ORAL_TABLET | Freq: Four times a day (QID) | ORAL | Status: DC | PRN

## 2015-01-18 MED ORDER — SODIUM CHLORIDE 0.9 % IV SOLN
250.0000 mL | INTRAVENOUS | Status: DC | PRN
Start: 2015-01-18 — End: 2015-01-21

## 2015-01-18 MED ORDER — SODIUM CHLORIDE 0.9 % IJ SOLN
3.0000 mL | Freq: Two times a day (BID) | INTRAMUSCULAR | Status: DC
  Administered 2015-01-18: 3 mL via INTRAVENOUS

## 2015-01-18 MED ORDER — KETOROLAC TROMETHAMINE 30 MG/ML IJ SOLN
30.0000 mg | Freq: Once | INTRAMUSCULAR | Status: AC
  Administered 2015-01-18: 30 mg via INTRAVENOUS
  Filled 2015-01-18: qty 1

## 2015-01-18 NOTE — ED Notes (Signed)
Lab request additional lab green tube. Spoke with Estill Bamberg in lab who is aware additional tube being sent via tube station with plain label.

## 2015-01-18 NOTE — ED Notes (Addendum)
Pt from home c/o abdominal pain and emesis from a Crohn's flare up. She reports nonstop vomiting since early this a.m.  Recent admission for same. PCP changed her medication from bentyl to levsin she is experiencing no relief with change. Pt dry heaving during assessment.

## 2015-01-18 NOTE — Telephone Encounter (Signed)
Pt has been seen by Alonza Bogus PA in the ER.

## 2015-01-18 NOTE — Progress Notes (Addendum)
     01/15/2015 Meghan Charles [id number] Feb 06, 1971   History of Present Illness:  Meghan Charles is a 44 yo female with PMH of Crohn's enteritis, complicated in September by bilateral pneumonia with parapneumonic effusion requiring thoracentesis who was recently seen in follow-up by Dr. Hilarie Fredrickson on 12/31/2014. Unfortunately she was recently hospitalized with Crohn's flare of partial bowel obstruction.  CT scan performed at the time of recent admission showed a jejunal/proximal ileum thickening consistent with active Crohn's disease. She was taking 6 mg budesonide at the time of her flare.  While hospitalized she was placed on prednisone 40 mg daily.  At her follow-up with Dr. Hilarie Fredrickson that was decreased to 30 mg daily.  She was cleared by pulmonary to start Entyvio infusions, which are currently planned to start mid-February (we are currently awaiting insurance approval).    She presents to our office today with complaints of abdominal pain and nausea with vomiting.  She says that she had a bad week.  She has been on a clear liquid/soft diet and still has a lot of abdominal cramping in her mid-abdomen a while after eating.  Last night she had plain egg noodles for dinner but then woke up in the middle of the night vomiting; vomiting the morning as well.  She is using Levsin for the cramping but it does not help much.  Once again, she is on prednisone 30 mg daily currently.  She says that she had a little BM today and had diarrhea Monday into Tuesday.  She is passing flatus.  Current Medications, Allergies, Past Medical History, Past Surgical History, Family History and Social History were reviewed in [employer] record.   Physical Exam: BP 102/76 mmHg  Pulse 86  Ht 5\' 8"  (1.727 m)  Wt 135 lb 6.4 oz (61.417 kg)  BMI 20.59 kg/m2 General: Well developed white female in no acute distress Head: Normocephalic and atraumatic Eyes:  Sclerae anicteric, conjunctiva pink  Ears:  Normal auditory acuity Lungs: Clear throughout to auscultation Heart: Regular rate and rhythm Abdomen: Soft, non-distended.  Normal bowel sounds.  Mid-abdominal TTP without R/R/G. Musculoskeletal: Symmetrical with no gross deformities  Extremities: No edema  Neurological: Alert oriented x 4, grossly non-focal Psychological:  Alert and cooperative. Normal mood and affect  Assessment and Recommendations: 44 yo female with PMH of Crohn's enteritis, complicated in September by bilateral pneumonia with parapneumonic effusion requiring thoracentesis who is seen for follow-up but with recurrent complaints of abdominal pain, diarrhea, and nausea with vomiting  1. Crohn's enteritis with flare:  Has history of recent PSBO.  Discussed with Dr. Hilarie Fredrickson.  She is scheduled to start treatment with Entyvio in mid-February, which we will try to get moved up but we are currently waiting for insurance approval.  In the interim we will increase her prednisone back to 40 mg daily.  We will give her Zofran 4 mg ODT to take every 6 hours ATC.  Continue Levsin prn.  Will check abdominal x-rays (2-view) today.  She does not want to be admitted to the hospital at this time, but has agreed that if she continues to feel terrible or worsens over the weekend then to call our office an speak wit the on-call MD for hospital admission.  Failing outpatient steroid therapy is certainly grounds for hospital admission for IV steroids if she continues to do poorly.  Patient seen, examined, and I agree with the above documentation, including the assessment and plan.

## 2015-01-18 NOTE — H&P (Signed)
Primary Care Physician:  Rexene Edison, NP Primary Gastroenterologist:  Dr. Hilarie Fredrickson  CHIEF COMPLAINT:   Crohn's flare with abdominal pain and vomiting  HPI: Meghan Charles is a 44 y.o. female with PMH of Crohn's enteritis, complicated in September by bilateral pneumonia with parapneumonic effusion requiring thoracentesis who was recently seen in follow-up by Dr. Hilarie Fredrickson on 12/31/2014. Unfortunately she was recently hospitalized with Crohn's flare of partial bowel obstruction. CT scan performed at the time of recent admission showed a jejunal/proximal ileum thickening consistent with active Crohn's disease. She was taking 6 mg budesonide at the time of her flare. While hospitalized she was placed on prednisone 40 mg daily. At her follow-up with Dr. Hilarie Fredrickson that was decreased to 30 mg daily. She was cleared by pulmonary to start Entyvio infusions, which are currently planned to start mid-February (we are currently awaiting insurance approval).   She presented to our office today this past Friday with complaints of abdominal pain and nausea with vomiting. She was offered admission, but declined at that time.  Plan was to increase prednisone back to 40 mg daily, give her zofran to take ATC, and check abdominal x-ray.  She was told to call our on-call physician over the weekend for possible hospital admission if she continued to do poorly.  Abdominal x-ray showed "1. Mildly prominent loops of small bowel in the central lower abdomen with air-fluid levels most likely a mild localized adynamic ileus. Low-grade partial small bowel obstruction is possible, as was evident on the prior CT. The degree of small bowel dilation is less than on the prior CT."  She is now here in [facility] ED with abdominal pain, nausea, and vomiting.  She says that Friday night she had some symptoms, but Saturday she did well.  Then yesterday she ate some macaroni and cheese.  She started with severe pain and non-stop nausea and vomiting  over night and early this morning.  She was trying to wait for our office to open at 8:00 am but says that she just felt too terrible.  She hasn't moved her bowel since Friday at which time it was just a small amount.  Did pass some flatus this morning.  In the ED her WBC count is elevated at 17,000.  Potassium low at 3.1.  She received a dose of IV solumedrol as well as some pain medication and antiemetics.   Past Medical History  Diagnosis Date  . Gallbladder polyp   . Anxiety   . Colon polyps 05/2014    TUBULAR ADENOMA AND HYPERPLASTIC POLYP.  . Crohn's disease   . Tubular adenoma of colon     Past Surgical History  Procedure Laterality Date  . Other surgical history      heel surgery  . Wisdom teeth extracted    . Right heel surgery      with plates and screws  . Colonoscopy w/ biopsies  2015    Prior to Admission medications   Medication Sig Start Date End Date Taking? Authorizing Provider  hyoscyamine (LEVSIN SL) 0.125 MG SL tablet Place 1 tablet (0.125 mg total) under the tongue every 6 (six) hours as needed for cramping. 01/14/15  Yes Jerene Bears, MD  ondansetron (ZOFRAN) 4 MG tablet Take 1 tablet (4 mg total) by mouth every 6 (six) hours. 01/15/15  Yes Jessica D. Zehr, PA-C  predniSONE (DELTASONE) 20 MG tablet Use as directed Patient taking differently: Take 40 mg by mouth daily. Use as directed 12/31/14  Yes Lajuan Lines  Virgle Arth, MD    Current Facility-Administered Medications  Medication Dose Route Frequency Provider Last Rate Last Dose  . 0.45 % NaCl with KCl 20 mEq / L infusion   Intravenous Continuous Jessica D. Zehr, PA-C      . 0.9 %  sodium chloride infusion  250 mL Intravenous PRN Jessica D. Zehr, PA-C      . acetaminophen (TYLENOL) tablet 650 mg  650 mg Oral Q6H PRN Laban Emperor. Zehr, PA-C       Or  . acetaminophen (TYLENOL) suppository 650 mg  650 mg Rectal Q6H PRN Laban Emperor. Zehr, PA-C      . enoxaparin (LOVENOX) injection 40 mg  40 mg Subcutaneous Q24H Jessica D. Zehr,  PA-C      . enoxaparin (LOVENOX) injection 60 mg  1 mg/kg Subcutaneous Q12H Jessica D. Zehr, PA-C      . methylPREDNISolone sodium succinate (SOLU-MEDROL) 125 mg/2 mL injection 60 mg  60 mg Intravenous Q24H Jessica D. Zehr, PA-C   60 mg at 01/18/15 0904  . ondansetron (ZOFRAN) tablet 4 mg  4 mg Oral Q6H PRN Laban Emperor. Zehr, PA-C       Or  . ondansetron (ZOFRAN) injection 4 mg  4 mg Intravenous Q6H PRN Jessica D. Zehr, PA-C      . sodium chloride 0.9 % injection 3 mL  3 mL Intravenous Q12H Jessica D. Zehr, PA-C      . sodium chloride 0.9 % injection 3 mL  3 mL Intravenous PRN Jessica D. Zehr, PA-C       Current Outpatient Prescriptions  Medication Sig Dispense Refill  . hyoscyamine (LEVSIN SL) 0.125 MG SL tablet Place 1 tablet (0.125 mg total) under the tongue every 6 (six) hours as needed for cramping. 30 tablet 0  . ondansetron (ZOFRAN) 4 MG tablet Take 1 tablet (4 mg total) by mouth every 6 (six) hours. 60 tablet 0  . predniSONE (DELTASONE) 20 MG tablet Use as directed (Patient taking differently: Take 40 mg by mouth daily. Use as directed) 60 tablet 0    Allergies as of 01/18/2015  . (No Known Allergies)    Family History  Problem Relation Age of Onset  . Colon cancer Maternal Grandmother 66  . Esophageal cancer Neg Hx   . Stomach cancer Neg Hx   . Rectal cancer Neg Hx   . Pancreatic cancer Neg Hx     History   Social History  . Marital Status: Single    Spouse Name: N/A    Number of Children: N/A  . Years of Education: N/A   Occupational History  . Recruitment consultant    Social History Main Topics  . Smoking status: Never Smoker   . Smokeless tobacco: Never Used  . Alcohol Use: Yes     Comment: 3 times per week  . Drug Use: No  . Sexual Activity: No   Other Topics Concern  . Not on file   Social History Narrative    Review of Systems: Ten point ROS is O/W negative except as mentioned in HPI.  Physical Exam: Vital signs in last 24 hours: Temp:  [97.7 F  (36.5 C)] 97.7 F (36.5 C) (02/01 0739) Pulse Rate:  [107-130] 107 (02/01 0810) Resp:  [18] 18 (02/01 0810) BP: (149-168)/(93-115) 149/93 mmHg (02/01 0810) SpO2:  [98 %-100 %] 98 % (02/01 0810)   General:  Alert, Well-developed, well-nourished, pleasant and cooperative in NAD; tearful Head:  Normocephalic and atraumatic. Eyes:  Sclera clear, no icterus.  Conjunctiva pink. Ears:  Normal auditory acuity. Mouth:  No deformity or lesions.  Oropharynx pink & moist. Lungs:  Clear throughout to auscultation.  No wheezes, crackles, or rhonchi. No acute distress. Heart:  Tachy but regular rhythm Abdomen:  Soft, but abdomen is distended.  BS minimal.  Mild RLQ TTP. Msk:  Symmetrical without gross deformities. Normal posture. Pulses:  Normal pulses noted. Extremities:  Without clubbing or edema. Neurologic:  Alert and  oriented x4;  grossly normal neurologically. Skin:  Intact without significant lesions or rashes. Psych:  Alert and cooperative. Normal mood and affect.  Lab Results:  Recent Labs  01/18/15 0747  WBC 17.3*  HGB 15.2*  HCT 44.3  PLT 363   BMET  Recent Labs  01/18/15 0825  NA 140  K 3.1*  CL 102  CO2 29  GLUCOSE 113*  BUN 14  CREATININE 0.85  CALCIUM 8.9   LFT  Recent Labs  01/18/15 0825  PROT 6.9  ALBUMIN 3.7  AST 16  ALT 16  ALKPHOS 61  BILITOT 0.5   Impression / Plan: 44 yo female with PMH of Crohn's enteritis, complicated in September by bilateral pneumonia with parapneumonic effusion requiring thoracentesis.  Also with recent PSBO, which required admission.  Now here with abdominal pain, nausea, and vomiting despite prednisone 40 mg daily as outpatient.  1. Crohn's enteritis with flare: Has history of recent PSBO.  Current symptoms suspicious for the same vs adynamic ileus.  Patient is being admitted to the GI service for treatment with IV steroids, IVF's.  Will leave her NPO for now with ice chips only.  Anti-emetics and pain medication prn.  DVT  prophylaxis with Lovenox.    LOS: 0 days   ZEHR, JESSICA D.  01/18/2015, 9:21 AM  Patient seen, examined, and I agree with the above documentation, including the assessment and plan. Difficult situation with Crohn's enteritis now steroid dependent/refractory.  She had complicated PNA in Sept 8850 after 1 dose of Remicade.  Hesitant to redose with Remicade, though now cleared by pulmonary.  We are working to initiate [employer] therapy pending insurance approval.  This will not be a "quick" fix, but hopefully will help improve enteritis.  Failing at home with partial obstruction symptoms.  Now for IV steroids, IVFs, pain control, antiemetics.  May need surgical resection, though she would like to avoid this if at all possible.  Will see how she dose in the next several days.  DVT prophylaxis with Lovenox given hospitalization and active IBD.

## 2015-01-18 NOTE — Telephone Encounter (Signed)
Patient's Meghan Charles has been approved by [employer] from 01/14/15-01/14/16. Reference number [id number].

## 2015-01-19 ENCOUNTER — Other Ambulatory Visit: Payer: Self-pay

## 2015-01-19 DIAGNOSIS — K50118 Crohn's disease of large intestine with other complication: Secondary | ICD-10-CM

## 2015-01-19 LAB — CBC
HCT: 32.9 % — ABNORMAL LOW (ref 36.0–46.0)
Hemoglobin: 10.8 g/dL — ABNORMAL LOW (ref 12.0–15.0)
MCH: 29.4 pg (ref 26.0–34.0)
MCHC: 32.8 g/dL (ref 30.0–36.0)
MCV: 89.6 fL (ref 78.0–100.0)
Platelets: 248 10*3/uL (ref 150–400)
RBC: 3.67 MIL/uL — ABNORMAL LOW (ref 3.87–5.11)
RDW: 13 % (ref 11.5–15.5)
WBC: 8.2 10*3/uL (ref 4.0–10.5)

## 2015-01-19 LAB — BASIC METABOLIC PANEL
ANION GAP: 4 — AB (ref 5–15)
BUN: 11 mg/dL (ref 6–23)
CHLORIDE: 105 mmol/L (ref 96–112)
CO2: 27 mmol/L (ref 19–32)
Calcium: 8.4 mg/dL (ref 8.4–10.5)
Creatinine, Ser: 0.72 mg/dL (ref 0.50–1.10)
Glucose, Bld: 90 mg/dL (ref 70–99)
Potassium: 4.1 mmol/L (ref 3.5–5.1)
SODIUM: 136 mmol/L (ref 135–145)

## 2015-01-19 NOTE — ED Provider Notes (Signed)
CSN: [id number]     Arrival date & time 01/18/15  0732 History   First MD Initiated Contact with Patient 01/18/15 [phone]     Chief Complaint  Patient presents with  . Abdominal Pain  . Emesis     (Consider location/radiation/quality/duration/timing/severity/associated sxs/prior Treatment) HPI Comments: Patient is a 40 female with history of Crohn's disease. She presents with complaints of abdominal cramping and vomiting. This is been worsening over the past day. She was recently evaluated by her gastroenterologist on Friday. She called him and was told to come to the ER due to worsening symptoms. She denies any fevers or chills. She denies any bloody stool.  Patient is a 44 y.o. female presenting with abdominal pain and vomiting. The history is provided by the patient.  Abdominal Pain Pain location:  Generalized Pain quality: cramping   Pain radiates to:  Does not radiate Pain severity:  Moderate Onset quality:  Gradual Duration:  2 days Timing:  Constant Progression:  Worsening Chronicity:  Recurrent Relieved by:  Nothing Worsened by:  Nothing tried Ineffective treatments:  None tried Associated symptoms: vomiting   Emesis Associated symptoms: abdominal pain     Past Medical History  Diagnosis Date  . Gallbladder polyp   . Anxiety   . Colon polyps 05/2014    TUBULAR ADENOMA AND HYPERPLASTIC POLYP.  . Crohn's disease   . Tubular adenoma of colon    Past Surgical History  Procedure Laterality Date  . Other surgical history      heel surgery  . Wisdom teeth extracted    . Right heel surgery      with plates and screws  . Colonoscopy w/ biopsies  2015   Family History  Problem Relation Age of Onset  . Colon cancer Maternal Grandmother 74  . Esophageal cancer Neg Hx   . Stomach cancer Neg Hx   . Rectal cancer Neg Hx   . Pancreatic cancer Neg Hx    History  Substance Use Topics  . Smoking status: Never Smoker   . Smokeless tobacco: Never Used  . Alcohol Use: Yes      Comment: 3 times per week   OB History    No data available     Review of Systems  Gastrointestinal: Positive for vomiting and abdominal pain.  All other systems reviewed and are negative.     Allergies  Review of patient's allergies indicates no known allergies.  Home Medications   Prior to Admission medications   Medication Sig Start Date End Date Taking? Authorizing Provider  hyoscyamine (LEVSIN SL) 0.125 MG SL tablet Place 1 tablet (0.125 mg total) under the tongue every 6 (six) hours as needed for cramping. 01/14/15  Yes Jerene Bears, MD  ondansetron (ZOFRAN) 4 MG tablet Take 1 tablet (4 mg total) by mouth every 6 (six) hours. 01/15/15  Yes Jessica D. Zehr, PA-C  predniSONE (DELTASONE) 20 MG tablet Use as directed Patient taking differently: Take 40 mg by mouth daily. Use as directed 12/31/14  Yes Jerene Bears, MD   BP 116/78 mmHg  Pulse 93  Temp(Src) 97.8 F (36.6 C) (Oral)  Resp 20  Ht 5' 7.5" (1.715 m)  Wt 138 lb (62.596 kg)  BMI 21.28 kg/m2  SpO2 99% Physical Exam  Constitutional: She is oriented to person, place, and time. She appears well-developed and well-nourished. No distress.  HENT:  Head: Normocephalic and atraumatic.  Neck: Normal range of motion. Neck supple.  Cardiovascular: Normal rate and regular rhythm.  Exam reveals no gallop and no friction rub.   No murmur heard. Pulmonary/Chest: Effort normal and breath sounds normal. No respiratory distress. She has no wheezes.  Abdominal: Soft. Bowel sounds are normal. She exhibits no distension. There is tenderness.  There is generalized abdominal tenderness to palpation with no rebound and no guarding.  Musculoskeletal: Normal range of motion.  Neurological: She is alert and oriented to person, place, and time.  Skin: Skin is warm and dry. She is not diaphoretic.  Nursing note and vitals reviewed.   ED Course  Procedures (including critical care time) Labs Review Labs Reviewed  CBC WITH  DIFFERENTIAL/PLATELET - Abnormal; Notable for the following:    WBC 17.3 (*)    Hemoglobin 15.2 (*)    Neutrophils Relative % 79 (*)    Neutro Abs 13.7 (*)    Monocytes Absolute 1.5 (*)    All other components within normal limits  URINALYSIS, ROUTINE W REFLEX MICROSCOPIC - Abnormal; Notable for the following:    Color, Urine AMBER (*)    APPearance CLOUDY (*)    Bilirubin Urine SMALL (*)    Leukocytes, UA SMALL (*)    All other components within normal limits  COMPREHENSIVE METABOLIC PANEL - Abnormal; Notable for the following:    Potassium 3.1 (*)    Glucose, Bld 113 (*)    GFR calc non Af Amer 83 (*)    All other components within normal limits  CBC - Abnormal; Notable for the following:    RBC 3.67 (*)    Hemoglobin 10.8 (*)    HCT 32.9 (*)    All other components within normal limits  BASIC METABOLIC PANEL - Abnormal; Notable for the following:    Anion gap 4 (*)    All other components within normal limits  LIPASE, BLOOD  URINE MICROSCOPIC-ADD ON    Imaging Review No results found.   EKG Interpretation None      MDM   Final diagnoses:  None    Patient presents with what appears to be a flareup of Crohn's disease. She has an elevated white count, but laboratory studies are otherwise unremarkable. I received a call from the gastroenterology department stating that they would be here to admit the patient. In the meantime, she was hydrated and given pain medication and steroids. She appears more comfortable.    Veryl Speak, MD 01/19/15 [phone]

## 2015-01-19 NOTE — Progress Notes (Signed)
     [facility] Progress Note  Subjective:  Feeling somewhat better.  Had a couple of diarrhea bowel movements overnight.  Had some nausea and pain last night; last pain medication was around midnight and she still feels ok this AM.  Objective:  Vital signs in last 24 hours: Temp:  [97.8 F (36.6 C)-98.5 F (36.9 C)] 97.8 F (36.6 C) (02/02 0525) Pulse Rate:  [93-101] 93 (02/02 0525) Resp:  [18-20] 20 (02/02 0525) BP: (116-134)/(78-99) 116/78 mmHg (02/02 0525) SpO2:  [96 %-100 %] 99 % (02/02 0525) Weight:  [138 lb (62.596 kg)] 138 lb (62.596 kg) (02/01 1054)   General:  Alert, Well-developed, in NAD Heart:  Regular rate and rhythm; no murmurs Pulm:  CTAB.  No W/R/R. Abdomen:  Soft, minimally distended.  BS present.  Non-tender. Extremities:  Without edema. Neurologic:  Alert and  oriented x4;  grossly normal neurologically. Psych:  Alert and cooperative. Normal mood and affect.  Intake/Output from previous day: 02/01 0701 - 02/02 0700 In: 1791.7 [I.V.:1791.7] Out: -    Lab Results:  Recent Labs  01/18/15 0747 01/19/15 0545  WBC 17.3* 8.2  HGB 15.2* 10.8*  HCT 44.3 32.9*  PLT 363 248   BMET  Recent Labs  01/18/15 0825 01/19/15 0545  NA 140 136  K 3.1* 4.1  CL 102 105  CO2 29 27  GLUCOSE 113* 90  BUN 14 11  CREATININE 0.85 0.72  CALCIUM 8.9 8.4   LFT  Recent Labs  01/18/15 0825  PROT 6.9  ALBUMIN 3.7  AST 16  ALT 16  ALKPHOS 61  BILITOT 0.5   Assessment / Plan: -44 yo female with PMH of Crohn's enteritis, complicated in September by bilateral pneumonia with parapneumonic effusion requiring thoracentesis. Also with recent PSBO, which required admission. Now here with abdominal pain, nausea, and vomiting despite prednisone 40 mg daily as outpatient.  1. Crohn's enteritis with flare: Has history of recent PSBO. Current symptoms suspicious for the same vs adynamic ileus.  Continue IV steroids, IVF's, anti-emetics and pain medication  prn. DVT prophylaxis with Lovenox.  Will give clear liquids today.  *Entyvio infusion moved up to Tuesday February 9 at 9:00 am at [facility].   LOS: 1 day   Lauren Modisette D.  01/19/2015, 9:06 AM  Pager number [phone]

## 2015-01-20 DIAGNOSIS — K5090 Crohn's disease, unspecified, without complications: Secondary | ICD-10-CM | POA: Insufficient documentation

## 2015-01-20 DIAGNOSIS — Z79899 Other long term (current) drug therapy: Secondary | ICD-10-CM | POA: Insufficient documentation

## 2015-01-20 LAB — BASIC METABOLIC PANEL
ANION GAP: 7 (ref 5–15)
BUN: 7 mg/dL (ref 6–23)
CO2: 27 mmol/L (ref 19–32)
Calcium: 8.7 mg/dL (ref 8.4–10.5)
Chloride: 104 mmol/L (ref 96–112)
Creatinine, Ser: 0.67 mg/dL (ref 0.50–1.10)
GFR calc Af Amer: >90 mL/min (ref >90)
Glucose, Bld: 87 mg/dL (ref 70–99)
Potassium: 3.9 mmol/L (ref 3.5–5.1)
Sodium: 138 mmol/L (ref 135–145)

## 2015-01-20 LAB — CBC
HCT: 34.1 % — ABNORMAL LOW (ref 36.0–46.0)
Hemoglobin: 11.2 g/dL — ABNORMAL LOW (ref 12.0–15.0)
MCH: 29.6 pg (ref 26.0–34.0)
MCHC: 32.8 g/dL (ref 30.0–36.0)
MCV: 90 fL (ref 78.0–100.0)
PLATELETS: 274 10*3/uL (ref 150–400)
RBC: 3.79 MIL/uL — ABNORMAL LOW (ref 3.87–5.11)
RDW: 13.1 % (ref 11.5–15.5)
WBC: 5.9 10*3/uL (ref 4.0–10.5)

## 2015-01-20 MED ORDER — AZATHIOPRINE 50 MG PO TABS
50.0000 mg | ORAL_TABLET | Freq: Every day | ORAL | Status: DC
  Administered 2015-01-20 – 2015-01-21 (×2): 50 mg via ORAL
  Filled 2015-01-20 (×2): qty 1

## 2015-01-20 NOTE — Progress Notes (Signed)
    Progress Note   Subjective  Feel okay today. No abdominal pain. No nausea. No diarrhea   Objective   Vital signs in last 24 hours: Temp:  [98 F (36.7 C)-98.5 F (36.9 C)] 98 F (36.7 C) (02/03 0539) Pulse Rate:  [81-91] 81 (02/03 0539) Resp:  [18] 18 (02/03 0539) BP: (109-131)/(79-93) 109/92 mmHg (02/03 0539) SpO2:  [97 %-100 %] 100 % (02/03 0539) Last BM Date: 01/19/15 General:    white female in NAD Heart:  Regular rate and rhythm Abdomen:  Soft, nontender and nondistended. Normal bowel sounds. Extremities:  Without edema. Neurologic:  Alert and oriented,  grossly normal neurologically. Psych:  Cooperative. Normal mood and affect.     Lab Results:  Recent Labs  01/18/15 0747 01/19/15 0545 01/20/15 0517  WBC 17.3* 8.2 5.9  HGB 15.2* 10.8* 11.2*  HCT 44.3 32.9* 34.1*  PLT 363 248 274   BMET  Recent Labs  01/18/15 0825 01/19/15 0545 01/20/15 0517  NA 140 136 138  K 3.1* 4.1 3.9  CL 102 105 104  CO2 29 27 27   GLUCOSE 113* 90 87  BUN 14 11 7   CREATININE 0.85 0.72 0.67  CALCIUM 8.9 8.4 8.7      Assessment / Plan:     44 year old female with Crohn's enteritis. The mid-distal jejunum is inflamed / narrowed on recent CTscan. Patient has improved on IV steroids. No further pain. No nausea, no significant diarrhea. After one dose of Remicade patient developed PNA/ parapneumonic effusions requiring thoracentesis. Will not rechallenge Remicade. Plan is to try Entyvio (less potential side effects). Infusion arranged for 2/9 at 9am at [facility]. For now continue IV steroids, supportive care. Will advance to full liquids    LOS: 2 days   Tye Savoy  01/20/2015, 9:15 AM

## 2015-01-21 ENCOUNTER — Other Ambulatory Visit: Payer: Self-pay | Admitting: Nurse Practitioner

## 2015-01-21 ENCOUNTER — Other Ambulatory Visit: Payer: Self-pay

## 2015-01-21 DIAGNOSIS — K50918 Crohn's disease, unspecified, with other complication: Secondary | ICD-10-CM

## 2015-01-21 DIAGNOSIS — K50919 Crohn's disease, unspecified, with unspecified complications: Secondary | ICD-10-CM

## 2015-01-21 MED ORDER — PREDNISONE 10 MG PO TABS: ORAL_TABLET | ORAL | Status: DC

## 2015-01-21 MED ORDER — AZATHIOPRINE 50 MG PO TABS: 50.0000 mg | ORAL_TABLET | Freq: Every day | ORAL | Status: DC

## 2015-01-21 NOTE — Progress Notes (Signed)
Discharge instructions given to pt, verbalized understanding. Left the unit in stable condition. 

## 2015-01-21 NOTE — Discharge Summary (Signed)
[facility] Discharge Summary  Name: Meghan Charles MRN: [id number] DOB: Jan 11, 1971 44 y.o. PCP:  Rexene Edison, NP  Date of Admission: 01/18/2015  7:34 AM Date of Discharge: 01/21/2015 Primary Gastroenterologist:Jay Pyrtle, MD Discharging Physician: Zenovia Jarred, MD  Discharge Diagnosis:  Crohn's enteritis, improved  Consultations: none  GI Procedures: none  History/Physical Exam:  See Admission H&P  Admission HPI: Meghan Charles is a 44 y.o. female with PMH of Crohn's enteritis, complicated in September by bilateral pneumonia with parapneumonic effusion requiring thoracentesis who was recently seen in follow-up by Dr. Hilarie Fredrickson on 12/31/2014. Unfortunately she was recently hospitalized with Crohn's flare of partial bowel obstruction. CT scan performed at the time of recent admission showed a jejunal/proximal ileum thickening consistent with active Crohn's disease. She was taking 6 mg budesonide at the time of her flare. While hospitalized she was placed on prednisone 40 mg daily. At her follow-up with Dr. Hilarie Fredrickson that was decreased to 30 mg daily. She was cleared by pulmonary to start Entyvio infusions, which are currently planned to start mid-February (we are currently awaiting insurance approval).   She presented to our office today this past Friday with complaints of abdominal pain and nausea with vomiting. She was offered admission, but declined at that time. Plan was to increase prednisone back to 40 mg daily, give her zofran to take ATC, and check abdominal x-ray. She was told to call our on-call physician over the weekend for possible hospital admission if she continued to do poorly. Abdominal x-ray showed "1. Mildly prominent loops of small bowel in the central lower abdomen with air-fluid levels most likely a mild localized adynamic ileus. Low-grade partial small bowel obstruction is possible, as was evident on the prior CT. The degree of small bowel dilation is less than  on the prior CT."  She is now here in [facility] ED with abdominal pain, nausea, and vomiting. She says that Friday night she had some symptoms, but Saturday she did well. Then yesterday she ate some macaroni and cheese. She started with severe pain and non-stop nausea and vomiting over night and early this morning. She was trying to wait for our office to open at 8:00 am but says that she just felt too terrible. She hasn't moved her bowel since Friday at which time it was just a small amount. Did pass some flatus this morning. In the ED her WBC count is elevated at 17,000. Potassium low at 3.1. She received a dose of IV solumedrol as well as some pain medication and antiemetics  Hospital Course by problem list:  Crohn's enteritis flare.  Patient was admitted through the emergency department 01/18/15 with abdominal pain, nausea, and vomiting despite outpatient prednisone 40 mg daily.We admitted her to a medical bed for IV steroids, supportive care. We initiated Azathioprine.  Her symptoms improved over the following few days, we advanced her to a soft diet. Patient was discharged home on 01/21/15 with a prednisone taper, Azathioprine, and plans for her first Entyvio infusion on Tuesday. She was advised to have labs Q 2 weeks for the next 8 weeks at our office (orders put in computer)  Discharge Vitals:  BP 132/88 mmHg  Pulse 74  Temp(Src) 98.8 F (37.1 C) (Oral)  Resp 16  Ht 5' 7.5" (1.715 m)  Wt 138 lb (62.596 kg)  BMI 21.28 kg/m2  SpO2 97%  Physical Exam General: pleasant, alert, NAD Cardiac: RRR Pulmonary: Lungs CTA bilaterally Abdominal: Soft, nondistended, nontender, active bowel sounds Extremity: no edema Neuro: alert and oriented:  Discharge Labs: No results found for this or any previous visit (from the past 24 hour(s)).  Disposition and follow-up:   Ms.Meghan Charles was discharged from [facility] in stable condition.    Follow-up Appointments:  Dr. Hilarie Fredrickson 03/22/15 at  9:30am.   Discharge Medications:   Medication List    TAKE these medications        azaTHIOprine 50 MG tablet  Commonly known as:  IMURAN  Take 1 tablet (50 mg total) by mouth daily.     hyoscyamine 0.125 MG SL tablet  Commonly known as:  LEVSIN SL  Place 1 tablet (0.125 mg total) under the tongue every 6 (six) hours as needed for cramping.     ondansetron 4 MG tablet  Commonly known as:  ZOFRAN  Take 1 tablet (4 mg total) by mouth every 6 (six) hours.     predniSONE 10 MG tablet  Commonly known as:  DELTASONE  Take 40mg  (4 tablets) daily for 14 days then decrease by 5mg  every 10 days. When you get down to 5mg , take for 10 days then stop medication  Start taking on:  01/22/2015        Signed: Tye Savoy 01/21/2015, 9:23 AM

## 2015-01-25 ENCOUNTER — Other Ambulatory Visit (HOSPITAL_COMMUNITY): Payer: Self-pay | Admitting: *Deleted

## 2015-01-26 ENCOUNTER — Encounter (HOSPITAL_COMMUNITY)
Admission: RE | Admit: 2015-01-26 | Discharge: 2015-01-26 | Disposition: A | Discharge status: Home / Self Care | Payer: BLUE CROSS/BLUE SHIELD | Source: Ambulatory Visit | Attending: Internal Medicine | Admitting: Internal Medicine

## 2015-01-26 ENCOUNTER — Telehealth: Payer: Self-pay | Admitting: *Deleted

## 2015-01-26 VITALS — BP 117/78 | HR 95 | Temp 98.2°F | Resp 20 | Ht 68.0 in | Wt 133.0 lb

## 2015-01-26 DIAGNOSIS — K50118 Crohn's disease of large intestine with other complication: Secondary | ICD-10-CM | POA: Diagnosis not present

## 2015-01-26 DIAGNOSIS — K50919 Crohn's disease, unspecified, with unspecified complications: Secondary | ICD-10-CM

## 2015-01-26 DIAGNOSIS — Z79899 Other long term (current) drug therapy: Secondary | ICD-10-CM | POA: Insufficient documentation

## 2015-01-26 MED ORDER — VEDOLIZUMAB 300 MG IV SOLR
300.0000 mg | Freq: Once | INTRAVENOUS | Status: AC
  Administered 2015-01-26: 300 mg via INTRAVENOUS
  Filled 2015-01-26: qty 5

## 2015-01-26 NOTE — Telephone Encounter (Signed)
I have left a message for patient to call back. 

## 2015-01-26 NOTE — Telephone Encounter (Signed)
-----   Message from Jerene Bears, MD sent at 01/26/2015  1:31 PM EST ----- AZA started in hospital Cannot remember if I already asked for labs She needs CBC and CMP late next week If okay will dose increase

## 2015-01-26 NOTE — Telephone Encounter (Signed)
I spoke to patient and advised of Dr Vena Rua recommendations. She will come next Thursday, 02/04/15 for labwork. Orders have been entered into EPIC.

## 2015-02-02 ENCOUNTER — Encounter (HOSPITAL_COMMUNITY): Admission: RE | Admit: 2015-02-02 | Payer: BLUE CROSS/BLUE SHIELD | Source: Ambulatory Visit

## 2015-02-03 ENCOUNTER — Telehealth: Payer: Self-pay | Admitting: Internal Medicine

## 2015-02-03 MED ORDER — HYOSCYAMINE SULFATE 0.125 MG SL SUBL: 0.1250 mg | SUBLINGUAL_TABLET | Freq: Four times a day (QID) | SUBLINGUAL | Status: DC | PRN

## 2015-02-03 NOTE — Telephone Encounter (Signed)
Rx sent 

## 2015-02-04 ENCOUNTER — Telehealth: Payer: Self-pay | Admitting: Internal Medicine

## 2015-02-04 NOTE — Telephone Encounter (Signed)
Pt states she thinks she is "dying." States she started having diarrhea yesterday and abdominal cramping. Taking Levsin for the cramping. No blood in stool. No diarrhea today. Started vomiting today at 4am, states she can't keep anything down and zofran is not helping. Pt received 1st dose of Entyvio 01/26/15. Please advise.

## 2015-02-04 NOTE — Telephone Encounter (Signed)
Spoke with pt and she states she does not think she is bad enough to go to the hospital. Per Dr. Hilarie Fredrickson the pt needs to have clear liquids only, continue her Prednisone, currently on 10m, continue her Levsin for cramping and take zofran 890mevery 8 hours for nausea. Pt instructed to go to the hospital if her pain got worse, or if she cannot stop vomiting. Pt verbalized understanding.

## 2015-02-04 NOTE — Telephone Encounter (Signed)
Needs direct admission for probable partial versus complete bowel obstruction in the settting of Crohn's flare Will need GI and surgical consultation

## 2015-02-05 ENCOUNTER — Other Ambulatory Visit (INDEPENDENT_AMBULATORY_CARE_PROVIDER_SITE_OTHER): Payer: BLUE CROSS/BLUE SHIELD

## 2015-02-05 DIAGNOSIS — K50919 Crohn's disease, unspecified, with unspecified complications: Secondary | ICD-10-CM

## 2015-02-05 DIAGNOSIS — K50918 Crohn's disease, unspecified, with other complication: Secondary | ICD-10-CM

## 2015-02-05 LAB — CBC WITH DIFFERENTIAL/PLATELET
Basophils Absolute: 0 10*3/uL (ref 0.0–0.1)
Basophils Relative: 0.3 % (ref 0.0–3.0)
EOS PCT: 0.2 % (ref 0.0–5.0)
Eosinophils Absolute: 0 10*3/uL (ref 0.0–0.7)
HCT: 38.4 % (ref 36.0–46.0)
HEMOGLOBIN: 13.2 g/dL (ref 12.0–15.0)
LYMPHS PCT: 8.1 % — AB (ref 12.0–46.0)
Lymphs Abs: 0.8 10*3/uL (ref 0.7–4.0)
MCHC: 34.3 g/dL (ref 30.0–36.0)
MCV: 85.8 fl (ref 78.0–100.0)
MONOS PCT: 0.7 % — AB (ref 3.0–12.0)
Monocytes Absolute: 0.1 10*3/uL (ref 0.1–1.0)
NEUTROS ABS: 8.7 10*3/uL — AB (ref 1.4–7.7)
Neutrophils Relative %: 90.7 % — ABNORMAL HIGH (ref 43.0–77.0)
Platelets: 403 10*3/uL — ABNORMAL HIGH (ref 150.0–400.0)
RBC: 4.47 Mil/uL (ref 3.87–5.11)
RDW: 13.6 % (ref 11.5–15.5)
WBC: 9.6 10*3/uL (ref 4.0–10.5)

## 2015-02-05 LAB — HEPATIC FUNCTION PANEL
ALBUMIN: 4.1 g/dL (ref 3.5–5.2)
ALT: 16 U/L (ref 0–35)
AST: 13 U/L (ref 0–37)
Alkaline Phosphatase: 52 U/L (ref 39–117)
BILIRUBIN DIRECT: 0.2 mg/dL (ref 0.0–0.3)
Total Bilirubin: 0.6 mg/dL (ref 0.2–1.2)
Total Protein: 7.3 g/dL (ref 6.0–8.3)

## 2015-02-05 LAB — COMPREHENSIVE METABOLIC PANEL
ALT: 16 U/L (ref 0–35)
AST: 13 U/L (ref 0–37)
Albumin: 4.1 g/dL (ref 3.5–5.2)
Alkaline Phosphatase: 52 U/L (ref 39–117)
BUN: 21 mg/dL (ref 6–23)
CALCIUM: 9.6 mg/dL (ref 8.4–10.5)
CHLORIDE: 99 meq/L (ref 96–112)
CO2: 28 meq/L (ref 19–32)
CREATININE: 0.82 mg/dL (ref 0.40–1.20)
GFR: 80.7 mL/min (ref >60.00)
GLUCOSE: 161 mg/dL — AB (ref 70–99)
Potassium: 4 mEq/L (ref 3.5–5.1)
SODIUM: 134 meq/L — AB (ref 135–145)
TOTAL PROTEIN: 7.3 g/dL (ref 6.0–8.3)
Total Bilirubin: 0.6 mg/dL (ref 0.2–1.2)

## 2015-02-08 ENCOUNTER — Telehealth: Payer: Self-pay | Admitting: Internal Medicine

## 2015-02-08 NOTE — Telephone Encounter (Signed)
Thanks for checking on her Copied to telephone for documentation purposes

## 2015-02-08 NOTE — Telephone Encounter (Signed)
-----   Message from Maury Dus, RN sent at 02/08/2015  4:47 PM EST ----- Regarding: Update Pt states she went home from work after talking to [place] on Thursday and she went to bed. States she was able to take her meds after resting for a while and now feels better. States she is still having a little cramping but is much better.

## 2015-02-09 ENCOUNTER — Ambulatory Visit (INDEPENDENT_AMBULATORY_CARE_PROVIDER_SITE_OTHER)
Admission: RE | Admit: 2015-02-09 | Discharge: 2015-02-09 | Disposition: A | Discharge status: Home / Self Care | Payer: BLUE CROSS/BLUE SHIELD | Source: Ambulatory Visit | Attending: Internal Medicine | Admitting: Internal Medicine

## 2015-02-09 ENCOUNTER — Encounter: Payer: Self-pay | Admitting: Internal Medicine

## 2015-02-09 ENCOUNTER — Ambulatory Visit (INDEPENDENT_AMBULATORY_CARE_PROVIDER_SITE_OTHER): Payer: BLUE CROSS/BLUE SHIELD | Admitting: Internal Medicine

## 2015-02-09 VITALS — BP 148/104 | HR 102 | Ht 68.0 in | Wt 139.0 lb

## 2015-02-09 DIAGNOSIS — J189 Pneumonia, unspecified organism: Secondary | ICD-10-CM

## 2015-02-09 NOTE — Patient Instructions (Signed)
ICD-9-CM ICD-10-CM   1. CAP (community acquired pneumonia) 42 J18.9 DG Chest 2 View    Cxr shows stable scar tissue Continue daily activities, exercise and healthy living Continue IBD care through Dr Hilarie Fredrickson  Followup  9 months with Dr Chase Caller; will decide on imaging after I see you  return sooner if needed

## 2015-02-09 NOTE — Progress Notes (Signed)
Subjective:    Patient ID: Meghan Charles, female    DOB: 03-04-1971, 43 y.o.   MRN: [id number]  HPI    Ov 02/09/2015  Chief Complaint  Patient presents with  . Follow-up    Pt stated her breathing is unchanged since seeing TP in 12/2014. Pt stated she still gets SOB with and without activity and at times she has pain on left rib area during inhalation. Pt denies cough.      44 year old female previously healthy but then was diagnosed with Crohn's disease in June 2015. In September 2000 and received her first dose of Remicade. Then on 09/24/2014 when she was supposed to get a second dose of Remicade [name] up in the hospital with bilateral pneumonia and associated left-sided exudative pleural effusion. Cultures have been negative. She was treated for a bacterial process.  Since then her Remicade has been on hold. Given significant serious adverse reaction with the Remicade alternative for her Crohn's disease has been explored. He also wanted to wait and give her sufficient time till she improved from her pneumonia. Since I last saw her she's had admissions for flare up of colitis therefore it was decided to start her monoclonal antibody treatment last week    with Vedoluzumab IV: 300 mg at 0, 2, and 6 weeks and then every 8 weeks thereafte.So far she has tolerated it well.  She says that in terms of her deconditioning from her pneumonia and shortness of breath she significantly improved and almost to baseline. Occasionally she has some pain with deep inspiration in the left lower quadrant but otherwise is well. Her main issues that flareups of colitis. 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 Bilateral breast implants. Again noted and left upper lobe and left lower  lobe perihilar region. No convincing pulmonary edema.  IMPRESSION: No significant change. Bilateral breast implants. Again noted bilateral chronic infiltrates right lower lobe perihilar and left upper lobe. No convincing pulmonary edema.   Electronically Signed   By: Lahoma Crocker M.D.   On: 02/09/2015 16:49      Review of Systems  Constitutional: Negative for fever and unexpected weight change.  HENT: Negative for congestion, dental problem, ear pain, nosebleeds, postnasal drip, rhinorrhea, sinus pressure, sneezing, sore throat and trouble swallowing.   Eyes: Negative for redness and itching.  Respiratory: Positive for shortness of breath. Negative for cough, chest tightness and wheezing.   Cardiovascular: Positive for chest pain. Negative for palpitations and leg swelling.  Gastrointestinal: Negative for nausea and vomiting.  Genitourinary: Negative for dysuria.  Musculoskeletal: Negative for joint swelling.  Skin: Negative for rash.  Neurological: Negative for headaches.  Hematological: Does not bruise/bleed easily.  Psychiatric/Behavioral: Negative for dysphoric mood. The patient is not nervous/anxious.        Objective:   Physical Exam  Constitutional: She is oriented to person, place, and time. She appears well-developed and well-nourished. No distress.  HENT:  Head: Normocephalic and atraumatic.  Right Ear: External ear normal.  Left Ear: External ear normal.  Mouth/Throat: Oropharynx is clear and moist. No oropharyngeal exudate.  Eyes: Conjunctivae and EOM are normal. Pupils are equal, round, and reactive to light. Right eye exhibits no discharge. Left eye exhibits no discharge. No scleral icterus.  Neck: Normal  range of motion. Neck supple. No JVD present. No tracheal deviation present. No thyromegaly present.  Cardiovascular: Normal rate, regular rhythm, normal heart sounds and intact distal pulses.  Exam reveals no gallop and no friction rub.   No murmur  heard. Pulmonary/Chest: Effort normal and breath sounds normal. No respiratory distress. She has no wheezes. She has no rales. She exhibits no tenderness.  Abdominal: Soft. Bowel sounds are normal. She exhibits no distension and no mass. There is no tenderness. There is no rebound and no guarding.  Musculoskeletal: Normal range of motion. She exhibits no edema or tenderness.  Lymphadenopathy:    She has no cervical adenopathy.  Neurological: She is alert and oriented to person, place, and time. She has normal reflexes. No cranial nerve deficit. She exhibits normal muscle tone. Coordination normal.  Skin: Skin is warm and dry. No rash noted. She is not diaphoretic. No erythema. No pallor.  Psychiatric: She has a normal mood and affect. Her behavior is normal. Judgment and thought content normal.  Vitals reviewed.   Filed Vitals:   02/09/15 1651  BP: 148/104  Pulse: 102  Height: 5' 8"  (1.727 m)  Weight: 139 lb (63.05 kg)  SpO2: 97%         Assessment & Plan:     ICD-9-CM ICD-10-CM   1. CAP (community acquired pneumonia) 34 J18.9 DG Chest 2 View    Cxr shows stable scar tissue Continue daily activities, exercise and healthy living Continue IBD care through Dr Hilarie Fredrickson  Followup  9 months with Dr Chase Caller; will decide on imaging after I see you - hold off on ct/cxr and do clinically due to XRT exposure  return sooner if needed   (> 50% of this 15 min visit spent in face to face counseling or/and coordination of care)   Dr. Brand Males, M.D., [facility].C.P Pulmonary and Critical Care Medicine Staff Physician [place] [facility] Pager: [phone], If no answer or between  15:00h - 7:00h: call [phone]  02/09/2015 5:27 PM

## 2015-02-12 ENCOUNTER — Ambulatory Visit: Payer: BC Managed Care – PPO | Admitting: Internal Medicine

## 2015-02-15 ENCOUNTER — Telehealth: Payer: Self-pay | Admitting: Internal Medicine

## 2015-02-15 DIAGNOSIS — R109 Unspecified abdominal pain: Secondary | ICD-10-CM

## 2015-02-15 DIAGNOSIS — K50919 Crohn's disease, unspecified, with unspecified complications: Secondary | ICD-10-CM

## 2015-02-15 DIAGNOSIS — R197 Diarrhea, unspecified: Secondary | ICD-10-CM

## 2015-02-15 MED ORDER — ONDANSETRON HCL 4 MG PO TABS: 4.0000 mg | ORAL_TABLET | Freq: Four times a day (QID) | ORAL | Status: DC

## 2015-02-15 MED ORDER — HYDROCODONE-ACETAMINOPHEN 5-325 MG PO TABS: ORAL_TABLET | ORAL | Status: DC

## 2015-02-15 NOTE — Telephone Encounter (Signed)
Dr Hilarie Fredrickson- Shall I continue refilling hydrocodone (#60 on 12/31/14) and zofran (#60 on 01/15/15)?

## 2015-02-15 NOTE — Telephone Encounter (Signed)
Okay to refill while we are waiting on biologic medication hopefully to help with moderate to severe Crohn's disease

## 2015-02-15 NOTE — Telephone Encounter (Signed)
Zofran sent to pharmacy. Hydrocodone script placed on Dr Vena Rua desk for signature.

## 2015-02-16 ENCOUNTER — Ambulatory Visit (HOSPITAL_COMMUNITY): Payer: BLUE CROSS/BLUE SHIELD

## 2015-02-16 ENCOUNTER — Telehealth: Payer: Self-pay | Admitting: Internal Medicine

## 2015-02-16 NOTE — Telephone Encounter (Signed)
Pt is scheduled for Entyvio at [facility] 02/18/15@9am . Pt aware.

## 2015-02-18 ENCOUNTER — Ambulatory Visit (HOSPITAL_COMMUNITY)
Admission: RE | Admit: 2015-02-18 | Discharge: 2015-02-18 | Disposition: A | Discharge status: Home / Self Care | Payer: BLUE CROSS/BLUE SHIELD | Source: Ambulatory Visit | Attending: Internal Medicine | Admitting: Internal Medicine

## 2015-02-18 VITALS — BP 137/99 | HR 94 | Temp 98.1°F | Resp 18 | Ht 68.0 in | Wt 135.0 lb

## 2015-02-18 DIAGNOSIS — K50118 Crohn's disease of large intestine with other complication: Secondary | ICD-10-CM | POA: Diagnosis present

## 2015-02-18 MED ORDER — VEDOLIZUMAB 300 MG IV SOLR
300.0000 mg | Freq: Once | INTRAVENOUS | Status: AC
  Administered 2015-02-18: 300 mg via INTRAVENOUS
  Filled 2015-02-18: qty 5

## 2015-02-23 ENCOUNTER — Other Ambulatory Visit: Payer: Self-pay | Admitting: Internal Medicine

## 2015-02-23 ENCOUNTER — Telehealth: Payer: Self-pay | Admitting: Internal Medicine

## 2015-02-23 ENCOUNTER — Other Ambulatory Visit: Payer: Self-pay

## 2015-02-23 DIAGNOSIS — K5010 Crohn's disease of large intestine without complications: Secondary | ICD-10-CM

## 2015-02-23 NOTE — Telephone Encounter (Signed)
Spoke with pt and she is aware. Pt scheduled for her 3rd dose of Entyvio March 22@9am  at [facility]. Pt aware of appt and order in epic.

## 2015-02-23 NOTE — Telephone Encounter (Signed)
The noises secondary to just the movement of her small bowel on the setting of Crohn's inflammation with narrowings throughout the small bowel hopefully with reduced inflammation this will improve. Antispasmodic can be used as needed which she already has a prescription for

## 2015-02-23 NOTE — Telephone Encounter (Signed)
Pt states that she is still having some cramping but she wanted to let us know that the noises her stomach makes are so loud. States it sounds like her stomach is growling but it isn't. Pt wanted to know if Dr. Hilarie Fredrickson had any further suggestions.

## 2015-03-02 ENCOUNTER — Ambulatory Visit (HOSPITAL_COMMUNITY): Payer: BLUE CROSS/BLUE SHIELD

## 2015-03-04 ENCOUNTER — Other Ambulatory Visit: Payer: Self-pay | Admitting: Internal Medicine

## 2015-03-08 ENCOUNTER — Other Ambulatory Visit (HOSPITAL_COMMUNITY): Payer: Self-pay | Admitting: *Deleted

## 2015-03-09 ENCOUNTER — Encounter (HOSPITAL_COMMUNITY): Payer: BLUE CROSS/BLUE SHIELD

## 2015-03-09 ENCOUNTER — Ambulatory Visit (HOSPITAL_COMMUNITY)
Admission: RE | Admit: 2015-03-09 | Discharge: 2015-03-09 | Disposition: A | Discharge status: Home / Self Care | Payer: BLUE CROSS/BLUE SHIELD | Source: Ambulatory Visit | Attending: Internal Medicine | Admitting: Internal Medicine

## 2015-03-09 VITALS — BP 142/94 | HR 98 | Temp 98.6°F | Resp 20 | Ht 68.0 in | Wt 135.0 lb

## 2015-03-09 DIAGNOSIS — K5010 Crohn's disease of large intestine without complications: Secondary | ICD-10-CM

## 2015-03-09 MED ORDER — VEDOLIZUMAB 300 MG IV SOLR
300.0000 mg | Freq: Once | INTRAVENOUS | Status: AC
  Administered 2015-03-09: 300 mg via INTRAVENOUS
  Filled 2015-03-09: qty 5

## 2015-03-11 ENCOUNTER — Telehealth: Payer: Self-pay

## 2015-03-11 NOTE — Telephone Encounter (Signed)
Pt scheduled for [facility] May 17@9am . Pt instructed to schedule another appt for 8 weeks after May 17. Pt aware of appt.

## 2015-03-16 ENCOUNTER — Ambulatory Visit (HOSPITAL_COMMUNITY): Payer: BLUE CROSS/BLUE SHIELD

## 2015-03-18 ENCOUNTER — Other Ambulatory Visit: Payer: Self-pay

## 2015-03-18 DIAGNOSIS — K5090 Crohn's disease, unspecified, without complications: Secondary | ICD-10-CM

## 2015-03-22 ENCOUNTER — Ambulatory Visit: Payer: BLUE CROSS/BLUE SHIELD | Admitting: Internal Medicine

## 2015-03-25 ENCOUNTER — Telehealth: Payer: Self-pay | Admitting: Adult Health

## 2015-03-25 NOTE — Telephone Encounter (Signed)
Message not needed, made appt/spm

## 2015-03-26 ENCOUNTER — Ambulatory Visit (INDEPENDENT_AMBULATORY_CARE_PROVIDER_SITE_OTHER): Payer: BLUE CROSS/BLUE SHIELD | Admitting: Pulmonary Disease

## 2015-03-26 ENCOUNTER — Encounter: Payer: Self-pay | Admitting: Internal Medicine

## 2015-03-26 ENCOUNTER — Ambulatory Visit (INDEPENDENT_AMBULATORY_CARE_PROVIDER_SITE_OTHER): Payer: BLUE CROSS/BLUE SHIELD | Admitting: Internal Medicine

## 2015-03-26 VITALS — BP 142/92 | HR 112 | Temp 98.0°F | Ht 68.0 in | Wt 143.0 lb

## 2015-03-26 DIAGNOSIS — Z9109 Other allergy status, other than to drugs and biological substances: Secondary | ICD-10-CM | POA: Insufficient documentation

## 2015-03-26 DIAGNOSIS — D849 Immunodeficiency, unspecified: Secondary | ICD-10-CM

## 2015-03-26 DIAGNOSIS — J301 Allergic rhinitis due to pollen: Secondary | ICD-10-CM | POA: Diagnosis not present

## 2015-03-26 DIAGNOSIS — J019 Acute sinusitis, unspecified: Secondary | ICD-10-CM

## 2015-03-26 DIAGNOSIS — D899 Disorder involving the immune mechanism, unspecified: Secondary | ICD-10-CM | POA: Diagnosis not present

## 2015-03-26 MED ORDER — FLUTICASONE PROPIONATE 50 MCG/ACT NA SUSP: 2.0000 | Freq: Every day | NASAL | Status: DC

## 2015-03-26 MED ORDER — OLOPATADINE HCL 0.2 % OP SOLN: 1.0000 [drp] | Freq: Every day | OPHTHALMIC | Status: DC

## 2015-03-26 MED ORDER — LEVOFLOXACIN 500 MG PO TABS: 500.0000 mg | ORAL_TABLET | Freq: Every day | ORAL | Status: DC

## 2015-03-26 NOTE — Patient Instructions (Addendum)
ICD-9-CM ICD-10-CM   1. Acute sinusitis, recurrence not specified, unspecified location 461.9 J01.90   2. Pollen allergies 477.0 J30.1   3. Immunosuppressed status 279.9 D89.9    Hold off lab work or CXR Start levaquin 571m once daily x 7 days NEtti pot daily compliance Flonase daily compliance STart pataday eye drops 1 into each eye  IF worse, call [phone] or go to ER if worse or not better ROV   9 months from Feb 2016 visit

## 2015-03-26 NOTE — Progress Notes (Signed)
Subjective:    Patient ID: Meghan Charles, female    DOB: 1971/09/11, 44 y.o.   MRN: [id number]  HPI    OV 03/26/2015  Chief Complaint  Patient presents with  . Acute Visit    Pt c/o prod cough pt unsure of color, slight increase in SOB, sinus pressure, PND x 5 days. Pt denies CP/tightness.   Immunosuppressed patient because of Crohn's disease currently undergoing monoclonal antibody infusion every 8 weeks and Imuran. She had adverse reaction to TNF alpha blockade with severe pneumonia several months ago. Overall she's been doing well since I last saw him in February 2016. 6 days ago went to the [facility] but has been having a flare up of her usual spring allergies. Somewhat has been difficult to control despite Flonase MediPort and antihistamine systemic medication. However 3 days ago had low-grade fever and since then has had more tiredness rated as moderate severity, worsening cough with sinus drainage feels infected. She does not know the color of the sinus drainage. No further fever but does have a subjective sensation of feeling warm. No wheezing or shortness of breath or chest pain or orthopnea paroxysmal nocturnal dyspnea      has a past medical history of Gallbladder polyp; Anxiety; Colon polyps (05/2014); Crohn's disease; and Tubular adenoma of colon.   reports that she has never smoked. She has never used smokeless tobacco.  Past Surgical History  Procedure Laterality Date  . Other surgical history      heel surgery  . Wisdom teeth extracted    . Right heel surgery      with plates and screws  . Colonoscopy w/ biopsies  2015    No Known Allergies  Immunization History  Administered Date(s) Administered  . Hep A / Hep B 07/06/2014, 08/06/2014  . Pneumococcal Polysaccharide-23 07/01/2014    Family History  Problem Relation Age of Onset  . Colon cancer Maternal Grandmother 25  . Esophageal cancer Neg Hx   . Stomach cancer Neg Hx   . Rectal cancer Neg Hx   .  Pancreatic cancer Neg Hx      Current outpatient prescriptions:  .  azaTHIOprine (IMURAN) 50 MG tablet, Take 1 tablet (50 mg total) by mouth daily., Disp: 30 tablet, Rfl: 2 .  ondansetron (ZOFRAN) 4 MG tablet, Take 1 tablet (4 mg total) by mouth every 6 (six) hours., Disp: 60 tablet, Rfl: 1    Review of Systems  Constitutional: Negative for fever and unexpected weight change.  HENT: Positive for postnasal drip, rhinorrhea and sinus pressure. Negative for congestion, dental problem, ear pain, nosebleeds, sneezing, sore throat and trouble swallowing.   Eyes: Negative for redness and itching.  Respiratory: Positive for cough and shortness of breath. Negative for chest tightness and wheezing.   Cardiovascular: Negative for palpitations and leg swelling.  Gastrointestinal: Negative for nausea and vomiting.  Genitourinary: Negative for dysuria.  Musculoskeletal: Negative for joint swelling.  Skin: Negative for rash.  Neurological: Negative for headaches.  Hematological: Does not bruise/bleed easily.  Psychiatric/Behavioral: Negative for dysphoric mood. The patient is not nervous/anxious.        Objective:   Physical Exam  Filed Vitals:   03/26/15 0917  BP: 142/92  Pulse: 112  Temp: 98 F (36.7 C)  TempSrc: Oral  Height: 5' 8"  (1.727 m)  Weight: 143 lb (64.864 kg)  SpO2: 98%    Constitutional: She is oriented to person, place, and time. She appears well-developed and well-nourished. No distress.  BUT  looks a bit more tired HENT:  NASAL CATTARH + NASAL TURBINATES SWOLLEN A BIT Head: Normocephalic and atraumatic.  Right Ear: External ear normal.  Left Ear: External ear normal.  Mouth/Throat: Oropharynx is clear and moist. No oropharyngeal exudate.  Eyes: Conjunctivae and EOM are normal. Pupils are equal, round, and reactive to light. Right eye exhibits no discharge. Left eye exhibits no discharge. No scleral icterus.  Neck: Normal range of motion. Neck supple. No JVD present.  No tracheal deviation present. No thyromegaly present.  Cardiovascular: Normal rate, regular rhythm, normal heart sounds and intact distal pulses.  Exam reveals no gallop and no friction rub.   No murmur heard. Pulmonary/Chest: Effort normal and breath sounds normal. No respiratory distress. She has no wheezes. She has no rales. She exhibits no tenderness.  Abdominal: Soft. Bowel sounds are normal. She exhibits no distension and no mass. There is no tenderness. There is no rebound and no guarding.  Musculoskeletal: Normal range of motion. She exhibits no edema or tenderness.  Lymphadenopathy:    She has no cervical adenopathy.  Neurological: She is alert and oriented to person, place, and time. She has normal reflexes. No cranial nerve deficit. She exhibits normal muscle tone. Coordination normal.  Skin: Skin is warm and dry. No rash noted. She is not diaphoretic. No erythema. No pallor.  Psychiatric: She has a normal mood and affect. Her behavior is normal. Judgment and thought content normal.  Vitals reviewed.       Assessment & Plan:     ICD-9-CM ICD-10-CM   1. Acute sinusitis, recurrence not specified, unspecified location 461.9 J01.90   2. Pollen allergies 477.0 J30.1   3. Immunosuppressed status 279.9 D89.9     Hold off lab work or CXR Start levaquin 521m once daily x 7 days NEtti pot daily compliance Flonase daily compliance STart pataday eye drops 1 into each eye  IF worse, call [phone] or go to ER if worse or not better ROV   9 months from Feb 2016 visit    Dr. MBrand Males M.D., F.C.C.P Pulmonary and Critical Care Medicine Staff Physician [facility] Pager: [phone] If no answer or between  15:00h - 7:00h: call [phone]  03/26/2015 10:02 AM

## 2015-04-19 ENCOUNTER — Telehealth: Payer: Self-pay | Admitting: Internal Medicine

## 2015-04-19 ENCOUNTER — Other Ambulatory Visit (INDEPENDENT_AMBULATORY_CARE_PROVIDER_SITE_OTHER): Payer: BLUE CROSS/BLUE SHIELD

## 2015-04-19 ENCOUNTER — Ambulatory Visit (INDEPENDENT_AMBULATORY_CARE_PROVIDER_SITE_OTHER)
Admission: RE | Admit: 2015-04-19 | Discharge: 2015-04-19 | Disposition: A | Discharge status: Home / Self Care | Payer: BLUE CROSS/BLUE SHIELD | Source: Ambulatory Visit | Attending: Internal Medicine | Admitting: Internal Medicine

## 2015-04-19 ENCOUNTER — Other Ambulatory Visit: Payer: Self-pay

## 2015-04-19 DIAGNOSIS — R1031 Right lower quadrant pain: Secondary | ICD-10-CM

## 2015-04-19 DIAGNOSIS — K50918 Crohn's disease, unspecified, with other complication: Secondary | ICD-10-CM

## 2015-04-19 LAB — CBC WITH DIFFERENTIAL/PLATELET
Basophils Absolute: 0 10*3/uL (ref 0.0–0.1)
Basophils Relative: 0.5 % (ref 0.0–3.0)
Eosinophils Absolute: 0.1 10*3/uL (ref 0.0–0.7)
Eosinophils Relative: 1.1 % (ref 0.0–5.0)
HCT: 38.8 % (ref 36.0–46.0)
HEMOGLOBIN: 13.2 g/dL (ref 12.0–15.0)
LYMPHS PCT: 20.2 % (ref 12.0–46.0)
Lymphs Abs: 1.8 10*3/uL (ref 0.7–4.0)
MCHC: 34 g/dL (ref 30.0–36.0)
MCV: 88.8 fl (ref 78.0–100.0)
MONO ABS: 0.6 10*3/uL (ref 0.1–1.0)
MONOS PCT: 6.8 % (ref 3.0–12.0)
Neutro Abs: 6.3 10*3/uL (ref 1.4–7.7)
Neutrophils Relative %: 71.4 % (ref 43.0–77.0)
Platelets: 388 10*3/uL (ref 150.0–400.0)
RBC: 4.37 Mil/uL (ref 3.87–5.11)
RDW: 13.7 % (ref 11.5–15.5)
WBC: 8.8 10*3/uL (ref 4.0–10.5)

## 2015-04-19 LAB — HEPATIC FUNCTION PANEL
ALT: 10 U/L (ref 0–35)
AST: 13 U/L (ref 0–37)
Albumin: 4.3 g/dL (ref 3.5–5.2)
Alkaline Phosphatase: 57 U/L (ref 39–117)
BILIRUBIN TOTAL: 0.3 mg/dL (ref 0.2–1.2)
Bilirubin, Direct: 0.1 mg/dL (ref 0.0–0.3)
Total Protein: 8 g/dL (ref 6.0–8.3)

## 2015-04-19 LAB — COMPREHENSIVE METABOLIC PANEL
ALK PHOS: 57 U/L (ref 39–117)
ALT: 10 U/L (ref 0–35)
AST: 13 U/L (ref 0–37)
Albumin: 4.3 g/dL (ref 3.5–5.2)
BUN: 19 mg/dL (ref 6–23)
CO2: 25 mEq/L (ref 19–32)
Calcium: 9.7 mg/dL (ref 8.4–10.5)
Chloride: 106 mEq/L (ref 96–112)
Creatinine, Ser: 0.72 mg/dL (ref 0.40–1.20)
GFR: 93.68 mL/min (ref >60.00)
GLUCOSE: 95 mg/dL (ref 70–99)
Potassium: 4.1 mEq/L (ref 3.5–5.1)
Sodium: 137 mEq/L (ref 135–145)
Total Bilirubin: 0.3 mg/dL (ref 0.2–1.2)
Total Protein: 8 g/dL (ref 6.0–8.3)

## 2015-04-19 LAB — SEDIMENTATION RATE: SED RATE: 27 mm/h — AB (ref 0–22)

## 2015-04-19 LAB — HIGH SENSITIVITY CRP: CRP HIGH SENSITIVITY: 0.5 mg/L (ref 0.000–5.000)

## 2015-04-19 NOTE — Telephone Encounter (Signed)
Pt calling for lab results.

## 2015-04-19 NOTE — Telephone Encounter (Signed)
2v abd xray CBC, CMP, hsCRP, ESR

## 2015-04-19 NOTE — Telephone Encounter (Signed)
Labs and x-ray looked normal Would reduce diet to liquids, using a test by asthma moderate 0.125 to 0.25 mg every 4-6 hours as needed. Office visit with me.  I could see her tomorrow at 830 if she can be here

## 2015-04-19 NOTE — Telephone Encounter (Signed)
Spoke to pt and she will come for labs and xray, orders in epic.

## 2015-04-19 NOTE — Telephone Encounter (Signed)
Pt states that for the past 3-4 days she has been having abdominal pain on her right side. States it is tender to touch and she is swollen on that side. Also reports she is nauseated. She has taken zofran 66m and it is not helping. Pt has an OV scheduled with Dr. PHilarie Fredrickson5/5/16@8 :30am. Please advise.

## 2015-04-20 ENCOUNTER — Encounter: Payer: Self-pay | Admitting: Internal Medicine

## 2015-04-20 ENCOUNTER — Other Ambulatory Visit: Payer: Self-pay

## 2015-04-20 ENCOUNTER — Ambulatory Visit (INDEPENDENT_AMBULATORY_CARE_PROVIDER_SITE_OTHER): Payer: BLUE CROSS/BLUE SHIELD | Admitting: Internal Medicine

## 2015-04-20 VITALS — BP 110/70 | HR 92 | Ht 68.0 in | Wt 144.2 lb

## 2015-04-20 DIAGNOSIS — K50919 Crohn's disease, unspecified, with unspecified complications: Secondary | ICD-10-CM

## 2015-04-20 DIAGNOSIS — R1031 Right lower quadrant pain: Secondary | ICD-10-CM | POA: Diagnosis not present

## 2015-04-20 MED ORDER — CIPROFLOXACIN HCL 500 MG PO TABS: 500.0000 mg | ORAL_TABLET | Freq: Two times a day (BID) | ORAL | Status: DC

## 2015-04-20 MED ORDER — VEDOLIZUMAB 300 MG IV SOLR: 300.0000 mg | INTRAVENOUS | Status: DC

## 2015-04-20 MED ORDER — METRONIDAZOLE 500 MG PO TABS: 500.0000 mg | ORAL_TABLET | Freq: Three times a day (TID) | ORAL | Status: DC

## 2015-04-20 MED ORDER — HYDROCODONE-ACETAMINOPHEN 7.5-300 MG PO TABS: ORAL_TABLET | ORAL | Status: DC

## 2015-04-20 NOTE — Telephone Encounter (Signed)
Pt aware and scheduled to see Dr. Hilarie Fredrickson today at 8:30am.

## 2015-04-20 NOTE — Progress Notes (Signed)
Subjective:    Patient ID: Meghan Charles, female    DOB: February 25, 1971, 44 y.o.   MRN: [id number]  HPI Allisha Harter is a 44 yo female with PMH of Crohn's enteritis, complicated by bilateral pneumonia with effusions requiring hospitalization in the setting of Remicade induction who is seen in an urgent visit. She was last seen in the office in late January but has since started and completed her 3 induction doses of Entyvio. She tolerated this well. Here next dose is scheduled for May 04, 2015.  She has been off of prednisone for over a month. She reports she had been doing well feeling "normal" with a good appetite and normal bowel movements. Energy levels have returned to normal for her and she has been exercising and working out. She continues on azathioprine 50 mg daily.  For the past 4 days she has developed right lower abdominal tenderness and "soreness". For her this feels different than prior Crohn's flares. Previous Crohn's flares have been cramping and twisting, she describes as "like a wrench".  This is not her typical pain. Pain not worsened by eating or bowel movement. Again bowel movements have been regular. Appetite slightly down but no vomiting. She has had mild nausea. No fevers or chills. No cough, sore throat, runny nose, urinary symptoms or rash. She does not have menstrual periods as she gets Depo-Provera  She did try Levsin recently prescribed but this was unhelpful  Review of Systems As per history of present illness, otherwise negative  Current Medications, Allergies, Past Medical History, Past Surgical History, Family History and Social History were reviewed in [employer] record.     Objective:   Physical Exam BP 110/70 mmHg  Pulse 92  Ht 5\' 8"  (1.727 m)  Wt 144 lb 3.2 oz (65.409 kg)  BMI 21.93 kg/m2 Constitutional: Well-developed and well-nourished. No distress. HEENT: Normocephalic and atraumatic. Oropharynx is clear and moist. No  oropharyngeal exudate. Conjunctivae are normal.  No scleral icterus. Neck: Neck supple. Trachea midline. Cardiovascular: Normal rate, regular rhythm and intact distal pulses. No M/R/G Pulmonary/chest: Effort normal and breath sounds normal. No wheezing, rales or rhonchi. Abdominal: Soft, right lower quadrant tenderness without rebound or guarding, nondistended. Bowel sounds active throughout.  Extremities: no clubbing, cyanosis, or edema Lymphadenopathy: No cervical adenopathy noted. Neurological: Alert and oriented to person place and time. Skin: Skin is warm and dry. No rashes noted. Psychiatric: Normal mood and affect. Behavior is normal.  CBC    Component Value Date/Time   WBC 8.8 04/19/2015 1105   RBC 4.37 04/19/2015 1105   RBC 3.91 11/05/2012 0837   HGB 13.2 04/19/2015 1105   HCT 38.8 04/19/2015 1105   PLT 388.0 04/19/2015 1105   MCV 88.8 04/19/2015 1105   MCH 29.6 01/20/2015 0517   MCHC 34.0 04/19/2015 1105   RDW 13.7 04/19/2015 1105   LYMPHSABS 1.8 04/19/2015 1105   MONOABS 0.6 04/19/2015 1105   EOSABS 0.1 04/19/2015 1105   BASOSABS 0.0 04/19/2015 1105    CMP     Component Value Date/Time   NA 137 04/19/2015 1105   K 4.1 04/19/2015 1105   CL 106 04/19/2015 1105   CO2 25 04/19/2015 1105   GLUCOSE 95 04/19/2015 1105   BUN 19 04/19/2015 1105   CREATININE 0.72 04/19/2015 1105   CALCIUM 9.7 04/19/2015 1105   PROT 8.0 04/19/2015 1105   PROT 8.0 04/19/2015 1105   ALBUMIN 4.3 04/19/2015 1105   ALBUMIN 4.3 04/19/2015 1105   AST  13 04/19/2015 1105   AST 13 04/19/2015 1105   ALT 10 04/19/2015 1105   ALT 10 04/19/2015 1105   ALKPHOS 57 04/19/2015 1105   ALKPHOS 57 04/19/2015 1105   BILITOT 0.3 04/19/2015 1105   BILITOT 0.3 04/19/2015 1105   GFRNONAA >90 01/20/2015 0517   GFRAA >90 01/20/2015 0517   hsCRP 0.5 (lower than previously) Erythrocyte Sedimentation Rate     Component Value Date/Time   ESRSEDRATE 27* 04/19/2015 1105    abd xr -- yesterday,  unremarkable (reviewed)      Assessment & Plan:  44 yo female with PMH of Crohn's enteritis, complicated by bilateral pneumonia with effusions requiring hospitalization in the setting of Remicade induction who is seen in an urgent visit for right lower quadrant pain  1. RLQ pain/history of Crohn's enteritis on Entyvio -- her pain is not classic for her Crohn's enteritis. White count is normal and inflammatory markers are down relative to her previous numbers. She does have a history of diverticulosis seen at last colonoscopy so possibly she has diverticulitis. Cannot exclude Crohn's flare with atypical pain for her. She's been off prednisone for one month. --Initial treatment with antibiotics, Cipro 500 mg twice a day and Flagyl 500 mg 3 times a day 10 days --Vicodin 7. 5-301 to 2 tablets every 6 hours as needed for pain --Call Thursday and if not improving then would recommend cross-sectional imaging and probable steroid taper either with budesonide or prednisone --Plan Entyvio as scheduled in 2 weeks --Follow-up with me in 2-3 months, sooner if not improving  25 minutes spent with the patient

## 2015-04-20 NOTE — Patient Instructions (Signed)
We have sent the following medications to your pharmacy for you to pick up at your convenience: Cipro Flagyl  We have given you a prescription for Vicodin to take to your pharmacy.  You have been scheduled for a follow up appointment with Dr Hilarie Fredrickson on 06/14/15 @ 8:45 am.  Please call our office on Thursday or Friday if symptoms do not improve.

## 2015-04-22 ENCOUNTER — Ambulatory Visit: Payer: BLUE CROSS/BLUE SHIELD | Admitting: Internal Medicine

## 2015-05-03 ENCOUNTER — Emergency Department (HOSPITAL_COMMUNITY): Payer: BLUE CROSS/BLUE SHIELD

## 2015-05-03 ENCOUNTER — Inpatient Hospital Stay (HOSPITAL_COMMUNITY): Payer: BLUE CROSS/BLUE SHIELD

## 2015-05-03 ENCOUNTER — Inpatient Hospital Stay (HOSPITAL_COMMUNITY)
Admission: EM | Admit: 2015-05-03 | Discharge: 2015-05-05 | DRG: 386 | Disposition: A | Discharge status: Home / Self Care | Payer: BLUE CROSS/BLUE SHIELD | Attending: Internal Medicine | Admitting: Internal Medicine

## 2015-05-03 ENCOUNTER — Encounter (HOSPITAL_COMMUNITY): Payer: Self-pay | Admitting: *Deleted

## 2015-05-03 DIAGNOSIS — Z79899 Other long term (current) drug therapy: Secondary | ICD-10-CM | POA: Diagnosis not present

## 2015-05-03 DIAGNOSIS — K50918 Crohn's disease, unspecified, with other complication: Secondary | ICD-10-CM | POA: Diagnosis not present

## 2015-05-03 DIAGNOSIS — R110 Nausea: Secondary | ICD-10-CM

## 2015-05-03 DIAGNOSIS — R109 Unspecified abdominal pain: Secondary | ICD-10-CM

## 2015-05-03 DIAGNOSIS — K5669 Other intestinal obstruction: Secondary | ICD-10-CM

## 2015-05-03 DIAGNOSIS — R1031 Right lower quadrant pain: Secondary | ICD-10-CM

## 2015-05-03 DIAGNOSIS — Z800 Family history of malignant neoplasm of digestive organs: Secondary | ICD-10-CM

## 2015-05-03 DIAGNOSIS — R000 Tachycardia, unspecified: Secondary | ICD-10-CM

## 2015-05-03 DIAGNOSIS — F419 Anxiety disorder, unspecified: Secondary | ICD-10-CM | POA: Diagnosis present

## 2015-05-03 DIAGNOSIS — K50919 Crohn's disease, unspecified, with unspecified complications: Secondary | ICD-10-CM

## 2015-05-03 DIAGNOSIS — Z86010 Personal history of colonic polyps: Secondary | ICD-10-CM | POA: Diagnosis not present

## 2015-05-03 DIAGNOSIS — K50012 Crohn's disease of small intestine with intestinal obstruction: Secondary | ICD-10-CM | POA: Diagnosis present

## 2015-05-03 DIAGNOSIS — K50912 Crohn's disease, unspecified, with intestinal obstruction: Secondary | ICD-10-CM

## 2015-05-03 DIAGNOSIS — K56609 Unspecified intestinal obstruction, unspecified as to partial versus complete obstruction: Secondary | ICD-10-CM | POA: Diagnosis present

## 2015-05-03 DIAGNOSIS — R197 Diarrhea, unspecified: Secondary | ICD-10-CM

## 2015-05-03 DIAGNOSIS — K5090 Crohn's disease, unspecified, without complications: Secondary | ICD-10-CM | POA: Diagnosis present

## 2015-05-03 DIAGNOSIS — R112 Nausea with vomiting, unspecified: Secondary | ICD-10-CM | POA: Diagnosis present

## 2015-05-03 DIAGNOSIS — R935 Abnormal findings on diagnostic imaging of other abdominal regions, including retroperitoneum: Secondary | ICD-10-CM

## 2015-05-03 DIAGNOSIS — D72829 Elevated white blood cell count, unspecified: Secondary | ICD-10-CM | POA: Diagnosis not present

## 2015-05-03 LAB — COMPREHENSIVE METABOLIC PANEL
ALBUMIN: 4.5 g/dL (ref 3.5–5.0)
ALT: 21 U/L (ref 14–54)
AST: 28 U/L (ref 15–41)
Alkaline Phosphatase: 39 U/L (ref 38–126)
Anion gap: 10 (ref 5–15)
BUN: 14 mg/dL (ref 6–20)
CALCIUM: 9.3 mg/dL (ref 8.9–10.3)
CO2: 21 mmol/L — ABNORMAL LOW (ref 22–32)
Chloride: 107 mmol/L (ref 101–111)
Creatinine, Ser: 0.82 mg/dL (ref 0.44–1.00)
GFR calc Af Amer: >60 mL/min (ref >60)
Glucose, Bld: 142 mg/dL — ABNORMAL HIGH (ref 65–99)
Potassium: 3.7 mmol/L (ref 3.5–5.1)
Sodium: 138 mmol/L (ref 135–145)
Total Bilirubin: 0.8 mg/dL (ref 0.3–1.2)
Total Protein: 7.8 g/dL (ref 6.5–8.1)

## 2015-05-03 LAB — URINALYSIS, ROUTINE W REFLEX MICROSCOPIC
Bilirubin Urine: NEGATIVE
Glucose, UA: NEGATIVE mg/dL
KETONES UR: NEGATIVE mg/dL
LEUKOCYTES UA: NEGATIVE
NITRITE: NEGATIVE
PH: 6 (ref 5.0–8.0)
Protein, ur: NEGATIVE mg/dL
SPECIFIC GRAVITY, URINE: 1.018 (ref 1.005–1.030)
Urobilinogen, UA: 0.2 mg/dL (ref 0.0–1.0)

## 2015-05-03 LAB — CBC WITH DIFFERENTIAL/PLATELET
Basophils Absolute: 0 10*3/uL (ref 0.0–0.1)
Basophils Relative: 0 % (ref 0–1)
EOS ABS: 0.1 10*3/uL (ref 0.0–0.7)
EOS PCT: 0 % (ref 0–5)
HEMATOCRIT: 42.9 % (ref 36.0–46.0)
Hemoglobin: 14 g/dL (ref 12.0–15.0)
LYMPHS ABS: 1.1 10*3/uL (ref 0.7–4.0)
Lymphocytes Relative: 7 % — ABNORMAL LOW (ref 12–46)
MCH: 29.5 pg (ref 26.0–34.0)
MCHC: 32.6 g/dL (ref 30.0–36.0)
MCV: 90.5 fL (ref 78.0–100.0)
MONO ABS: 0.6 10*3/uL (ref 0.1–1.0)
Monocytes Relative: 4 % (ref 3–12)
Neutro Abs: 13.7 10*3/uL — ABNORMAL HIGH (ref 1.7–7.7)
Neutrophils Relative %: 89 % — ABNORMAL HIGH (ref 43–77)
PLATELETS: 358 10*3/uL (ref 150–400)
RBC: 4.74 MIL/uL (ref 3.87–5.11)
RDW: 12.7 % (ref 11.5–15.5)
WBC: 15.6 10*3/uL — ABNORMAL HIGH (ref 4.0–10.5)

## 2015-05-03 LAB — URINE MICROSCOPIC-ADD ON

## 2015-05-03 LAB — CLOSTRIDIUM DIFFICILE BY PCR: Toxigenic C. Difficile by PCR: NEGATIVE

## 2015-05-03 LAB — POC URINE PREG, ED: PREG TEST UR: NEGATIVE

## 2015-05-03 LAB — I-STAT CG4 LACTIC ACID, ED: LACTIC ACID, VENOUS: 1.06 mmol/L (ref 0.5–2.0)

## 2015-05-03 LAB — LIPASE, BLOOD: LIPASE: 31 U/L (ref 22–51)

## 2015-05-03 MED ORDER — ONDANSETRON HCL 4 MG/2ML IJ SOLN: 4.0000 mg | Freq: Three times a day (TID) | INTRAMUSCULAR | Status: DC | PRN

## 2015-05-03 MED ORDER — ONDANSETRON HCL 4 MG/2ML IJ SOLN
4.0000 mg | Freq: Four times a day (QID) | INTRAMUSCULAR | Status: DC | PRN
  Administered 2015-05-03 (×2): 4 mg via INTRAVENOUS
  Filled 2015-05-03 (×2): qty 2

## 2015-05-03 MED ORDER — ONDANSETRON HCL 4 MG PO TABS: 4.0000 mg | ORAL_TABLET | Freq: Four times a day (QID) | ORAL | Status: DC | PRN

## 2015-05-03 MED ORDER — DIPHENHYDRAMINE HCL 50 MG/ML IJ SOLN
25.0000 mg | Freq: Once | INTRAMUSCULAR | Status: AC
  Administered 2015-05-03: 25 mg via INTRAVENOUS
  Filled 2015-05-03: qty 1

## 2015-05-03 MED ORDER — SODIUM CHLORIDE 0.9 % IV SOLN
1000.0000 mL | Freq: Once | INTRAVENOUS | Status: AC
  Administered 2015-05-03: 1000 mL via INTRAVENOUS

## 2015-05-03 MED ORDER — ONDANSETRON HCL 4 MG/2ML IJ SOLN
4.0000 mg | Freq: Once | INTRAMUSCULAR | Status: AC
  Administered 2015-05-03: 4 mg via INTRAVENOUS
  Filled 2015-05-03: qty 2

## 2015-05-03 MED ORDER — SODIUM CHLORIDE 0.9 % IV SOLN
1000.0000 mL | INTRAVENOUS | Status: DC
  Administered 2015-05-03 – 2015-05-05 (×6): 1000 mL via INTRAVENOUS

## 2015-05-03 MED ORDER — IOHEXOL 300 MG/ML  SOLN
25.0000 mL | INTRAMUSCULAR | Status: AC
  Administered 2015-05-03 (×2): 25 mL via ORAL

## 2015-05-03 MED ORDER — METOCLOPRAMIDE HCL 5 MG/ML IJ SOLN
10.0000 mg | Freq: Once | INTRAMUSCULAR | Status: AC
  Administered 2015-05-03: 10 mg via INTRAVENOUS
  Filled 2015-05-03: qty 2

## 2015-05-03 MED ORDER — IOHEXOL 300 MG/ML  SOLN
100.0000 mL | Freq: Once | INTRAMUSCULAR | Status: AC | PRN
  Administered 2015-05-03: 100 mL via INTRAVENOUS

## 2015-05-03 MED ORDER — AZATHIOPRINE 50 MG PO TABS
50.0000 mg | ORAL_TABLET | Freq: Every day | ORAL | Status: DC
  Administered 2015-05-03 – 2015-05-05 (×3): 50 mg via ORAL
  Filled 2015-05-03 (×3): qty 1

## 2015-05-03 MED ORDER — FLUTICASONE PROPIONATE 50 MCG/ACT NA SUSP
2.0000 | Freq: Every day | NASAL | Status: DC
  Filled 2015-05-03: qty 16

## 2015-05-03 MED ORDER — MORPHINE SULFATE 4 MG/ML IJ SOLN
4.0000 mg | Freq: Once | INTRAMUSCULAR | Status: AC
  Administered 2015-05-03: 4 mg via INTRAVENOUS
  Filled 2015-05-03: qty 1

## 2015-05-03 MED ORDER — OLOPATADINE HCL 0.1 % OP SOLN
1.0000 [drp] | Freq: Two times a day (BID) | OPHTHALMIC | Status: DC
  Filled 2015-05-03: qty 5

## 2015-05-03 MED ORDER — ENOXAPARIN SODIUM 40 MG/0.4ML ~~LOC~~ SOLN
40.0000 mg | SUBCUTANEOUS | Status: DC
  Administered 2015-05-03 – 2015-05-05 (×3): 40 mg via SUBCUTANEOUS
  Filled 2015-05-03 (×3): qty 0.4

## 2015-05-03 MED ORDER — SODIUM CHLORIDE 0.9 % IV SOLN: INTRAVENOUS | Status: DC

## 2015-05-03 MED ORDER — MORPHINE SULFATE 4 MG/ML IJ SOLN
4.0000 mg | INTRAMUSCULAR | Status: DC | PRN
  Administered 2015-05-03 – 2015-05-04 (×5): 4 mg via INTRAVENOUS
  Filled 2015-05-03 (×5): qty 1

## 2015-05-03 MED ORDER — HYOSCYAMINE SULFATE 0.125 MG SL SUBL
0.1250 mg | SUBLINGUAL_TABLET | Freq: Four times a day (QID) | SUBLINGUAL | Status: DC | PRN
  Filled 2015-05-03: qty 1

## 2015-05-03 MED ORDER — MORPHINE SULFATE 2 MG/ML IJ SOLN: 2.0000 mg | INTRAMUSCULAR | Status: DC | PRN

## 2015-05-03 NOTE — Consult Note (Signed)
Referring Provider: [facility] Primary Care Physician:  PROVIDER NOT IN SYSTEM Primary Gastroenterologist:  Dr.Pyrtle  Reason for Consultation:   Crohns, SBO    HPI: Meghan Charles is a 44 y.o. female known to Dr. Hilarie Fredrickson with past medical history of Crohn's enteritis which has been complicated by bilateral pneumonia with effusions requiring hospitalization in the setting of Remicade induction. She was last seen in the office on May 3. She had completed her 3 induction doses of tibial which she tolerated well. She was off prednisone for over a month and had been on azathioprine 50 mg daily. When seen in the office in early May she presented with 4-5 days for right lower quadrant abdominal tenderness which she says felt different than her prior Crohn's flares plain films were obtained and were nonrevealing. She was given a trial of Cipro and Flagyl and advised to call the office if she had no improvement. On Saturday the 14th she developed worsening right-sided abdominal pain associated with nausea and vomiting. She came to the emergency room on the 16th and was submitted for small bowel obstruction. CT of the abdomen and pelvis revealed partial small bowel obstruction of the distal ileum with borderline proximal dilated small bowel loops. The transition point was located at the site of prior small bowel inflammation identified on CT January 2016, compatible with the developing stricture.   Past Medical History  Diagnosis Date  . Gallbladder polyp   . Anxiety   . Colon polyps 05/2014    TUBULAR ADENOMA AND HYPERPLASTIC POLYP.  . Crohn's disease   . Tubular adenoma of colon     Past Surgical History  Procedure Laterality Date  . Other surgical history      heel surgery  . Wisdom teeth extracted    . Right heel surgery      with plates and screws  . Colonoscopy w/ biopsies  2015    Prior to Admission medications   Medication Sig Start Date End Date Taking? Authorizing Provider    azaTHIOprine (IMURAN) 50 MG tablet Take 1 tablet (50 mg total) by mouth daily. 01/21/15  Yes Willia Craze, NP  fluticasone (FLONASE) 50 MCG/ACT nasal spray Place 2 sprays into both nostrils daily. 03/26/15  Yes Brand Males, MD  hyoscyamine (LEVSIN SL) 0.125 MG SL tablet Place 0.125 mg under the tongue every 6 (six) hours as needed for cramping.   Yes Historical Provider, MD  ondansetron (ZOFRAN) 4 MG tablet Take 1 tablet (4 mg total) by mouth every 6 (six) hours. 02/15/15  Yes Jerene Bears, MD  vedolizumab (ENTYVIO) 300 MG injection Inject 300 mg into the vein every 8 (eight) weeks.   Yes Historical Provider, MD  ciprofloxacin (CIPRO) 500 MG tablet Take 1 tablet (500 mg total) by mouth 2 (two) times daily. Patient not taking: Reported on 05/03/2015 04/20/15   Jerene Bears, MD  Hydrocodone-Acetaminophen (VICODIN ES) 7.5-300 MG TABS Take 1-2 tablets by mouth every 6 hours as needed. Patient not taking: Reported on 05/03/2015 04/20/15   Jerene Bears, MD  metroNIDAZOLE (FLAGYL) 500 MG tablet Take 1 tablet (500 mg total) by mouth 3 (three) times daily. Patient not taking: Reported on 05/03/2015 04/20/15   Jerene Bears, MD  Olopatadine HCl 0.2 % SOLN Apply 1 drop to eye daily. Patient not taking: Reported on 05/03/2015 03/26/15   Brand Males, MD    Current Facility-Administered Medications  Medication Dose Route Frequency Provider Last Rate Last Dose  . 0.9 %  sodium chloride infusion  1,000 mL Intravenous Continuous Delora Fuel, MD 563 mL/hr at 05/03/15 0639 1,000 mL at 05/03/15 0639  . azaTHIOprine (IMURAN) tablet 50 mg  50 mg Oral Daily Kinnie Feil, MD   50 mg at 05/03/15 1115  . enoxaparin (LOVENOX) injection 40 mg  40 mg Subcutaneous Q24H Kinnie Feil, MD   40 mg at 05/03/15 1113  . fluticasone (FLONASE) 50 MCG/ACT nasal spray 2 spray  2 spray Each Nare Daily Kinnie Feil, MD   2 spray at 05/03/15 0834  . hyoscyamine (LEVSIN SL) SL tablet 0.125 mg  0.125 mg Sublingual Q6H PRN Kinnie Feil, MD      . morphine 4 MG/ML injection 4 mg  4 mg Intravenous J4H PRN Delora Fuel, MD   4 mg at 70/26/37 1354  . olopatadine (PATANOL) 0.1 % ophthalmic solution 1 drop  1 drop Both Eyes BID Kinnie Feil, MD   1 drop at 05/03/15 0834  . ondansetron (ZOFRAN) tablet 4 mg  4 mg Oral Q6H PRN Kinnie Feil, MD       Or  . ondansetron (ZOFRAN) injection 4 mg  4 mg Intravenous Q6H PRN Kinnie Feil, MD   4 mg at 05/03/15 1137    Allergies as of 05/03/2015  . (No Known Allergies)    Family History  Problem Relation Age of Onset  . Colon cancer Maternal Grandmother 25  . Esophageal cancer Neg Hx   . Stomach cancer Neg Hx   . Rectal cancer Neg Hx   . Pancreatic cancer Neg Hx     History   Social History  . Marital Status: Single    Spouse Name: N/A  . Number of Children: N/A  . Years of Education: N/A   Occupational History  . Recruitment consultant    Social History Main Topics  . Smoking status: Never Smoker   . Smokeless tobacco: Never Used  . Alcohol Use: 0.0 oz/week    0 Standard drinks or equivalent per week     Comment: 3 times per week  . Drug Use: No  . Sexual Activity: No   Other Topics Concern  . Not on file   Social History Narrative    Review of Systems: Gen: Denies any fever, chills, sweats, anorexia, fatigue, weakness, malaise, weight loss, and sleep disorder CV: Denies chest pain, angina, palpitations, syncope, orthopnea, PND, peripheral edema, and claudication. Resp: Denies dyspnea at rest, dyspnea with exercise, cough, sputum, wheezing, coughing up blood, and pleurisy. GI: Denies vomiting blood, jaundice, and fecal incontinence.   Denies dysphagia or odynophagia. GU : Denies urinary burning, blood in urine, urinary frequency, urinary hesitancy, nocturnal urination, and urinary incontinence. MS: Denies joint pain, limitation of movement, and swelling, stiffness, low back pain, extremity pain. Denies muscle weakness, cramps, atrophy.  Derm:  Denies rash, itching, dry skin, hives, moles, warts, or unhealing ulcers.  Psych: Denies depression, anxiety, memory loss, suicidal ideation, hallucinations, paranoia, and confusion. Heme: Denies bruising, bleeding, and enlarged lymph nodes. Neuro:  Denies any headaches, dizziness, paresthesias.   Physical Exam: Vital signs in last 24 hours: Temp:  [97.4 F (36.3 C)-98.5 F (36.9 C)] 98.5 F (36.9 C) (05/16 0737) Pulse Rate:  [90-134] 90 (05/16 0737) Resp:  [20-22] 20 (05/16 0737) BP: (131-163)/(86-119) 131/86 mmHg (05/16 0737) SpO2:  [97 %] 97 % (05/16 0737) Weight:  [142 lb (64.411 kg)] 142 lb (64.411 kg) (05/16 0430) Last BM Date: 05/03/15 General:   Alert,  Well-developed, well-nourished, pleasant and cooperative in NAD Head:  Normocephalic and atraumatic. Eyes:  Sclera clear, no icterus.   Conjunctiva pink. Ears:  Normal auditory acuity. Nose:  No deformity, discharge,  or lesions. Mouth:  No deformity or lesions.   Neck:  Supple; no masses or thyromegaly. Lungs:  Clear throughout to auscultation.   Heart:  Regular rate and rhythm; no murmurs, clicks, rubs,  or gallops. Abdomen:  Soft,tender right lower quadrant BS active,nonpalp mass or hsm.   Rectal:  Deferred  Msk:  Symmetrical without gross deformities. . Pulses:  Normal pulses noted. Extremities: Without clubbing or edema. Neurologic: Alert and  oriented x4;  grossly normal neurologically. Skin: Intact without significant lesions or rashes.. Psych: Alert and cooperative. Normal mood and affect.  Intake/Output from previous day:   Intake/Output this shift: Total I/O In: 418.8 [I.V.:418.8] Out: -   Lab Results:  Recent Labs  05/03/15 0510  WBC 15.6*  HGB 14.0  HCT 42.9  PLT 358   BMET  Recent Labs  05/03/15 0510  NA 138  K 3.7  CL 107  CO2 21*  GLUCOSE 142*  BUN 14  CREATININE 0.82  CALCIUM 9.3   LFT  Recent Labs  05/03/15 0510  PROT 7.8  ALBUMIN 4.5  AST 28  ALT 21  ALKPHOS 39    BILITOT 0.8   PT/INR No results for input(s): LABPROT, INR in the last 72 hours. Hepatitis Panel No results for input(s): HEPBSAG, HCVAB, HEPAIGM, HEPBIGM in the last 72 hours.    Studies/Results: Ct Abdomen Pelvis W Contrast  05/03/2015   CLINICAL DATA:  44 year old female with a history of right lower quadrant pain for several days.  EXAM: CT ABDOMEN AND PELVIS WITH CONTRAST  TECHNIQUE: Multidetector CT imaging of the abdomen and pelvis was performed using the standard protocol following bolus administration of intravenous contrast.  CONTRAST:  140m OMNIPAQUE IOHEXOL 300 MG/ML  SOLN  COMPARISON:  CT abdomen 12/26/2014, chest CT 12/21/2014, CT abdomen 09/22/2014  FINDINGS: Chest:  Surgical changes of prior breast augmentation.  Asymmetric appearance of the thoracic cage again evident.  Heart size within normal limits.  No pericardial fluid/ thickening.  No hiatal hernia.  No confluent airspace disease or pneumothorax.  Abdomen/ pelvis:  Unremarkable liver.  Unremarkable spleen.  Left adrenal gland a small. Nodule involving the medial limb of the right adrenal gland. 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  Unremarkable appearance of the bilateral kidneys.  Unremarkable appearance of the pancreas.  Unremarkable gallbladder.  No intra abdominal air. Small amount of free fluid layered within the pelvis, with low Hounsfield units.  Enteric contrast within stomach and small bowel, with contrast traversing the ileocecal valve. The colon is relatively decompressed with no adjacent inflammatory changes.  Borderline dilated small bowel loops within the mid and distal small bowel. There is a transition from dilated small bowel to decompressed small bowel just proximal to the distal ileum and ileocecal valve (image 69 of series 2). This segment of small bowel is identical to the location of previously identified narrowing on the CT 12/26/2014. Contrast does traverse this segment.  The  enteric contrast present on the current study decreases the sensitivity for detection of hyper enhancement of the mucosa an bowel wall. No associated inflammation within the mesenteric. No significantly increased blood flow within the mesenteric vessels (negative "comb sign").  Normal appendix.  Unremarkable appearance of the urinary bladder. Unremarkable appearance of the adnexa.  No evidence of acute bony abnormality.  No significant degenerative changes. No sclerotic changes of the iliosacral joints. Unremarkable appearance of the proximal femurs.  IMPRESSION: Partial small bowel obstruction of the distal ileum with borderline proximal dilated small bowel loops. The transition point is located at the site of prior small bowel inflammation identified on CT 12/26/2014, compatible with developing stricture. The presence of enteric contrast on the current CT decreases the sensitivity for evaluation of the bowel wall, although the free fluid within the pelvis indicates degree of active inflammation.  Small size of the left adrenal gland may reflect adrenal insufficiency. Correlation with a history of longstanding steroid use recommended.  Signed,  Dulcy Fanny. Earleen Newport, DO  [facility]  [facility]   Electronically Signed   By: Corrie Mckusick D.O.   On: 05/03/2015 11:48   Dg Abd Acute W/chest  05/03/2015   CLINICAL DATA:  New RIGHT mid abdominal pain with nausea vomiting. History of Crohn's.  EXAM: DG ABDOMEN ACUTE W/ 1V CHEST  COMPARISON:  Abdominal radiograph Apr 19, 2015, chest radiograph February 09, 2015 and CT of the chest December 21, 2014  FINDINGS: Cardiomediastinal silhouette is unremarkable. Similar chronic airspace opacities with scarring. No pleural effusion. No pneumothorax. Soft tissue planes and included osseous structures are nonsuspicious; bilateral breast implants.  Mildly dilated small bowel in LEFT upper quadrant of measures up to 3.5 cm with scattered  air-fluid levels at varying levels, paucity of large bowel gas. No intra-abdominal mass effect or pathologic calcifications. No free air. Soft tissue planes and included osseous structures are nonsuspicious.  IMPRESSION: No acute cardiopulmonary process ; stable chronic pulmonary parenchymal changes.  Findings concerning for early small bowel obstruction. As there are varying small bowel air-fluid levels, enteritis could have this appearance though, less favored considering paucity of large bowel gas.   Electronically Signed   By: Elon Alas   On: 05/03/2015 05:40    IMPRESSION/PLAN: 44 year old female with a history of Crohn's enteritis previously complicated by pneumonia with effusion  requiring hospitalization in setting of remicade induction admitted through the ER yesterday with worsening abdominal pain, nausea, and vomiting. CT scan shows partial small bowel obstruction of the distal ileum with borderline proximal dilated small bowel loops with transition point located at the site of prior small bowel inflammation identified on CT in January 2016, compatible with the developing stricture. Have discussed NG tube placement with patient, but she prefers to hold off she has not vomited since about 6:00 this morning. If she vomits tonight she states that she is willing to have an NG tube placed. Patient is likely a surgical candidate, however she says she prefers to not have surgery early this week and she would like her mother to be here. Her mother lives in [place] that is going to a family wedding in [place] at the end of the week. Patient would like to hold off on surgery until early next week if at all possible however she realizes if she fails to improve she may need intervention sooner. As of now, continue nothing by mouth and IV fluids. Will check abdominal films in morning.   Hvozdovic, Deloris Ping 05/03/2015,  Pager [phone]  GI ATTENDING  History, laboratories, x-rays and prior  endoscopy reviewed. Patient personally seen and examined. Case discussed with Dr. Hilarie Fredrickson. Agree with H&P as outlined above. Patient has ileal Crohn's disease. Possible reaction to anti-TNF agent. On immunomodulators and vedolizumab for 5 months. He has had recurrent problems with abdominal pain postprandially. Now presents with  small bowel obstruction at the level of previous ileal Crohn's. Looks like the inflammatory component has significantly improved. At this point, patient would benefit most from surgical resection of her diseased ileum. I have discussed this with her. Dr. Hilarie Fredrickson agrees. She has been seen by surgery with whom I discussed the case as well.  Docia Chuck. Geri Seminole., M.D. [facility] Division of Gastroenterology

## 2015-05-03 NOTE — Consult Note (Signed)
Reason for Consult: RLQ abdominal pain Referring Physician: Dr. Delora Fuel   HPI: Meghan Charles is a 44 year old female with a history of Crohn's disease Dx 05/2014 and followed by Dr. Hilarie Fredrickson presenting with abdominal pain and vomiting.  The abdominal pain started about 2 weeks ago.  She was seen in Dr. Vena Rua office and started on Cipro/Flagyl which she has since completed.  She developed vomiting overnight.  Associated with diarrhea, chills and sweats.  Denies recent weight loss.  She was unable to tolerate remicade and has been on Imuran and Entyvio.  Her work up shows WBC 15.6k.  Abdominal AXR suggesting for a small bowel obstruction.  We have therefore been asked to evaluate the patient.  At present time, she continues to have abdominal pain.  Nausea and vomiting have resolved.  She does not appear in acute distress.  She reports this pain is atypical of crohn's flares.  She denies previous abdominal surgeries.    Past Medical History  Diagnosis Date  . Gallbladder polyp   . Anxiety   . Colon polyps 05/2014    TUBULAR ADENOMA AND HYPERPLASTIC POLYP.  . Crohn's disease   . Tubular adenoma of colon     Past Surgical History  Procedure Laterality Date  . Other surgical history      heel surgery  . Wisdom teeth extracted    . Right heel surgery      with plates and screws  . Colonoscopy w/ biopsies  2015    Family History  Problem Relation Age of Onset  . Colon cancer Maternal Grandmother 38  . Esophageal cancer Neg Hx   . Stomach cancer Neg Hx   . Rectal cancer Neg Hx   . Pancreatic cancer Neg Hx     Social History:  reports that she has never smoked. She has never used smokeless tobacco. She reports that she drinks alcohol. She reports that she does not use illicit drugs.  Allergies: No Known Allergies  Medications:  Prior to Admission medications   Medication Sig Start Date End Date Taking? Authorizing Provider  azaTHIOprine (IMURAN) 50 MG tablet Take 1 tablet (50  mg total) by mouth daily. 01/21/15  Yes Willia Craze, NP  fluticasone (FLONASE) 50 MCG/ACT nasal spray Place 2 sprays into both nostrils daily. 03/26/15  Yes Brand Males, MD  hyoscyamine (LEVSIN SL) 0.125 MG SL tablet Place 0.125 mg under the tongue every 6 (six) hours as needed for cramping.   Yes Historical Provider, MD  ondansetron (ZOFRAN) 4 MG tablet Take 1 tablet (4 mg total) by mouth every 6 (six) hours. 02/15/15  Yes Jerene Bears, MD  vedolizumab (ENTYVIO) 300 MG injection Inject 300 mg into the vein every 8 (eight) weeks.   Yes Historical Provider, MD  ciprofloxacin (CIPRO) 500 MG tablet Take 1 tablet (500 mg total) by mouth 2 (two) times daily. Patient not taking: Reported on 05/03/2015 04/20/15   Jerene Bears, MD  Hydrocodone-Acetaminophen (VICODIN ES) 7.5-300 MG TABS Take 1-2 tablets by mouth every 6 hours as needed. Patient not taking: Reported on 05/03/2015 04/20/15   Jerene Bears, MD  metroNIDAZOLE (FLAGYL) 500 MG tablet Take 1 tablet (500 mg total) by mouth 3 (three) times daily. Patient not taking: Reported on 05/03/2015 04/20/15   Jerene Bears, MD  Olopatadine HCl 0.2 % SOLN Apply 1 drop to eye daily. Patient not taking: Reported on 05/03/2015 03/26/15   Brand Males, MD     Results for orders  placed or performed during the hospital encounter of 05/03/15 (from the past 48 hour(s))  Comprehensive metabolic panel     Status: Abnormal   Collection Time: 05/03/15  5:10 AM  Result Value Ref Range   Sodium 138 135 - 145 mmol/L   Potassium 3.7 3.5 - 5.1 mmol/L   Chloride 107 101 - 111 mmol/L   CO2 21 (L) 22 - 32 mmol/L   Glucose, Bld 142 (H) 65 - 99 mg/dL   BUN 14 6 - 20 mg/dL   Creatinine, Ser 0.82 0.44 - 1.00 mg/dL   Calcium 9.3 8.9 - 10.3 mg/dL   Total Protein 7.8 6.5 - 8.1 g/dL   Albumin 4.5 3.5 - 5.0 g/dL   AST 28 15 - 41 U/L   ALT 21 14 - 54 U/L   Alkaline Phosphatase 39 38 - 126 U/L   Total Bilirubin 0.8 0.3 - 1.2 mg/dL   GFR calc non Af Amer >60 >60 mL/min   GFR calc  Af Amer >60 >60 mL/min    Comment: (NOTE) The eGFR has been calculated using the CKD EPI equation. This calculation has not been validated in all clinical situations. eGFR's persistently <60 mL/min signify possible Chronic Kidney Disease.    Anion gap 10 5 - 15  Lipase, blood     Status: None   Collection Time: 05/03/15  5:10 AM  Result Value Ref Range   Lipase 31 22 - 51 U/L  CBC with Differential     Status: Abnormal   Collection Time: 05/03/15  5:10 AM  Result Value Ref Range   WBC 15.6 (H) 4.0 - 10.5 K/uL   RBC 4.74 3.87 - 5.11 MIL/uL   Hemoglobin 14.0 12.0 - 15.0 g/dL   HCT 42.9 36.0 - 46.0 %   MCV 90.5 78.0 - 100.0 fL   MCH 29.5 26.0 - 34.0 pg   MCHC 32.6 30.0 - 36.0 g/dL   RDW 12.7 11.5 - 15.5 %   Platelets 358 150 - 400 K/uL   Neutrophils Relative % 89 (H) 43 - 77 %   Neutro Abs 13.7 (H) 1.7 - 7.7 K/uL   Lymphocytes Relative 7 (L) 12 - 46 %   Lymphs Abs 1.1 0.7 - 4.0 K/uL   Monocytes Relative 4 3 - 12 %   Monocytes Absolute 0.6 0.1 - 1.0 K/uL   Eosinophils Relative 0 0 - 5 %   Eosinophils Absolute 0.1 0.0 - 0.7 K/uL   Basophils Relative 0 0 - 1 %   Basophils Absolute 0.0 0.0 - 0.1 K/uL  I-Stat CG4 Lactic Acid, ED     Status: None   Collection Time: 05/03/15  5:22 AM  Result Value Ref Range   Lactic Acid, Venous 1.06 0.5 - 2.0 mmol/L  POC urine preg, ED     Status: None   Collection Time: 05/03/15  7:07 AM  Result Value Ref Range   Preg Test, Ur NEGATIVE NEGATIVE    Comment:        THE SENSITIVITY OF THIS METHODOLOGY IS >24 mIU/mL     Dg Abd Acute W/chest  05/03/2015   CLINICAL DATA:  New RIGHT mid abdominal pain with nausea vomiting. History of Crohn's.  EXAM: DG ABDOMEN ACUTE W/ 1V CHEST  COMPARISON:  Abdominal radiograph Apr 19, 2015, chest radiograph February 09, 2015 and CT of the chest December 21, 2014  FINDINGS: Cardiomediastinal silhouette is unremarkable. Similar chronic airspace opacities with scarring. No pleural effusion. No pneumothorax. Soft tissue  planes  and included osseous structures are nonsuspicious; bilateral breast implants.  Mildly dilated small bowel in LEFT upper quadrant of measures up to 3.5 cm with scattered air-fluid levels at varying levels, paucity of large bowel gas. No intra-abdominal mass effect or pathologic calcifications. No free air. Soft tissue planes and included osseous structures are nonsuspicious.  IMPRESSION: No acute cardiopulmonary process ; stable chronic pulmonary parenchymal changes.  Findings concerning for early small bowel obstruction. As there are varying small bowel air-fluid levels, enteritis could have this appearance though, less favored considering paucity of large bowel gas.   Electronically Signed   By: Elon Alas   On: 05/03/2015 05:40    Review of Systems  All other systems reviewed and are negative.  Blood pressure 131/86, pulse 90, temperature 98.5 F (36.9 C), temperature source Oral, resp. rate 20, height _0  (1.727 m), weight 64.411 kg (142 lb), SpO2 97 %. Physical Exam  Constitutional: She is oriented to person, place, and time. She appears well-developed and well-nourished. No distress.  Cardiovascular: Normal rate, regular rhythm, normal heart sounds and intact distal pulses.  Exam reveals no gallop and no friction rub.   No murmur heard. Respiratory: Effort normal and breath sounds normal.  GI: Soft. Bowel sounds are normal. She exhibits no distension and no mass. There is no rebound and no guarding.  Focally tender to RLQ.   Neurological: She is alert and oriented to person, place, and time.  Skin: Skin is warm and dry. No rash noted. She is not diaphoretic. No erythema. No pallor.  Psychiatric: She has a normal mood and affect. Her behavior is normal. Judgment and thought content normal.    Assessment/Plan: RLQ abdominal pain Leukocytosis Hx Crohn's disease  Agree with CT of abdomen and pelvis for further evaluation.  Appears more consistent with an infectious process  rather than an obstruction.  She does not need an NGT at this time.  Will await results.  Agree with a GI consult.  Thank you for the consult.  Surgery will follow along.      Meghan Charles  ANP-BC  05/03/2015, 7:51 AM

## 2015-05-03 NOTE — ED Provider Notes (Signed)
CSN: [id number]     Arrival date & time 05/03/15  0425 History   First MD Initiated Contact with Patient 05/03/15 [phone]     Chief Complaint  Patient presents with  . Abdominal Pain     (Consider location/radiation/quality/duration/timing/severity/associated sxs/prior Treatment) The history is provided by the patient.   44 year old female with a history of Crohn's disease stasis she's been having some crampy right lower quadrant pain for about the last week. About 3 hours ago, she started having nausea and vomiting with small amount of diarrhea. Abdominal cramping is worse and she rates it at 9/10. There is no radiation of pain. She denies fever but has had chills and sweats. There've been no arthralgias or myalgias. She denies any hematemesis or blood in her stools. She did take a dose of ondansetron without relief. Overall picture is typical of a flareup of her Crohn's disease.  Past Medical History  Diagnosis Date  . Gallbladder polyp   . Anxiety   . Colon polyps 05/2014    TUBULAR ADENOMA AND HYPERPLASTIC POLYP.  . Crohn's disease   . Tubular adenoma of colon    Past Surgical History  Procedure Laterality Date  . Other surgical history      heel surgery  . Wisdom teeth extracted    . Right heel surgery      with plates and screws  . Colonoscopy w/ biopsies  2015   Family History  Problem Relation Age of Onset  . Colon cancer Maternal Grandmother 21  . Esophageal cancer Neg Hx   . Stomach cancer Neg Hx   . Rectal cancer Neg Hx   . Pancreatic cancer Neg Hx    History  Substance Use Topics  . Smoking status: Never Smoker   . Smokeless tobacco: Never Used  . Alcohol Use: 0.0 oz/week    0 Standard drinks or equivalent per week     Comment: 3 times per week   OB History    No data available     Review of Systems  All other systems reviewed and are negative.     Allergies  Review of patient's allergies indicates no known allergies.  Home Medications   Prior to  Admission medications   Medication Sig Start Date End Date Taking? Authorizing Provider  azaTHIOprine (IMURAN) 50 MG tablet Take 1 tablet (50 mg total) by mouth daily. 01/21/15   Willia Craze, NP  ciprofloxacin (CIPRO) 500 MG tablet Take 1 tablet (500 mg total) by mouth 2 (two) times daily. 04/20/15   Jerene Bears, MD  fluticasone (FLONASE) 50 MCG/ACT nasal spray Place 2 sprays into both nostrils daily. 03/26/15   Brand Males, MD  Hydrocodone-Acetaminophen (VICODIN ES) 7.5-300 MG TABS Take 1-2 tablets by mouth every 6 hours as needed. 04/20/15   Jerene Bears, MD  hyoscyamine (LEVSIN SL) 0.125 MG SL tablet Place 0.125 mg under the tongue every 6 (six) hours as needed for cramping.    Historical Provider, MD  metroNIDAZOLE (FLAGYL) 500 MG tablet Take 1 tablet (500 mg total) by mouth 3 (three) times daily. 04/20/15   Jerene Bears, MD  Olopatadine HCl 0.2 % SOLN Apply 1 drop to eye daily. 03/26/15   Brand Males, MD  ondansetron (ZOFRAN) 4 MG tablet Take 1 tablet (4 mg total) by mouth every 6 (six) hours. 02/15/15   Jerene Bears, MD   BP 163/119 mmHg  Pulse 134  Temp(Src) 97.4 F (36.3 C) (Oral)  Resp 22  Ht  5' 8"  (1.727 m)  Wt 142 lb (64.411 kg)  BMI 21.60 kg/m2  SpO2 97% Physical Exam  Nursing note and vitals reviewed.  44 year old female, resting comfortably and in no acute distress. Vital signs are significant for hypertension, tachycardia, tachypnea. Oxygen saturation is 97%, which is normal. Head is normocephalic and atraumatic. PERRLA, EOMI. Oropharynx is clear. Neck is nontender and supple without adenopathy or JVD. Back is nontender and there is no CVA tenderness. Lungs are clear without rales, wheezes, or rhonchi. Chest is nontender. Heart has regular rate and rhythm without murmur. Abdomen is soft, flat, with moderate tenderness in the right lower quadrant. There is no rebound or guarding. There are no masses or hepatosplenomegaly and peristalsis is nhypoactive. Extremities have  no cyanosis or edema, full range of motion is present. Skin is warm and dry without rash. Neurologic: Mental status is normal, cranial nerves are intact, there are no motor or sensory deficits.  ED Course  Procedures (including critical care time) Labs Review Results for orders placed or performed during the hospital encounter of 05/03/15  Comprehensive metabolic panel  Result Value Ref Range   Sodium 138 135 - 145 mmol/L   Potassium 3.7 3.5 - 5.1 mmol/L   Chloride 107 101 - 111 mmol/L   CO2 21 (L) 22 - 32 mmol/L   Glucose, Bld 142 (H) 65 - 99 mg/dL   BUN 14 6 - 20 mg/dL   Creatinine, Ser 0.82 0.44 - 1.00 mg/dL   Calcium 9.3 8.9 - 10.3 mg/dL   Total Protein 7.8 6.5 - 8.1 g/dL   Albumin 4.5 3.5 - 5.0 g/dL   AST 28 15 - 41 U/L   ALT 21 14 - 54 U/L   Alkaline Phosphatase 39 38 - 126 U/L   Total Bilirubin 0.8 0.3 - 1.2 mg/dL   GFR calc non Af Amer >60 >60 mL/min   GFR calc Af Amer >60 >60 mL/min   Anion gap 10 5 - 15  Lipase, blood  Result Value Ref Range   Lipase 31 22 - 51 U/L  CBC with Differential  Result Value Ref Range   WBC 15.6 (H) 4.0 - 10.5 K/uL   RBC 4.74 3.87 - 5.11 MIL/uL   Hemoglobin 14.0 12.0 - 15.0 g/dL   HCT 42.9 36.0 - 46.0 %   MCV 90.5 78.0 - 100.0 fL   MCH 29.5 26.0 - 34.0 pg   MCHC 32.6 30.0 - 36.0 g/dL   RDW 12.7 11.5 - 15.5 %   Platelets 358 150 - 400 K/uL   Neutrophils Relative % 89 (H) 43 - 77 %   Neutro Abs 13.7 (H) 1.7 - 7.7 K/uL   Lymphocytes Relative 7 (L) 12 - 46 %   Lymphs Abs 1.1 0.7 - 4.0 K/uL   Monocytes Relative 4 3 - 12 %   Monocytes Absolute 0.6 0.1 - 1.0 K/uL   Eosinophils Relative 0 0 - 5 %   Eosinophils Absolute 0.1 0.0 - 0.7 K/uL   Basophils Relative 0 0 - 1 %   Basophils Absolute 0.0 0.0 - 0.1 K/uL  I-Stat CG4 Lactic Acid, ED  Result Value Ref Range   Lactic Acid, Venous 1.06 0.5 - 2.0 mmol/L   Imaging Review Dg Abd Acute W/chest  05/03/2015   CLINICAL DATA:  New RIGHT mid abdominal pain with nausea vomiting. History of  Crohn's.  EXAM: DG ABDOMEN ACUTE W/ 1V CHEST  COMPARISON:  Abdominal radiograph Apr 19, 2015, chest radiograph February 09, 2015 and CT of the chest December 21, 2014  FINDINGS: Cardiomediastinal silhouette is unremarkable. Similar chronic airspace opacities with scarring. No pleural effusion. No pneumothorax. Soft tissue planes and included osseous structures are nonsuspicious; bilateral breast implants.  Mildly dilated small bowel in LEFT upper quadrant of measures up to 3.5 cm with scattered air-fluid levels at varying levels, paucity of large bowel gas. No intra-abdominal mass effect or pathologic calcifications. No free air. Soft tissue planes and included osseous structures are nonsuspicious.  IMPRESSION: No acute cardiopulmonary process ; stable chronic pulmonary parenchymal changes.  Findings concerning for early small bowel obstruction. As there are varying small bowel air-fluid levels, enteritis could have this appearance though, less favored considering paucity of large bowel gas.   Electronically Signed   By: Elon Alas   On: 05/03/2015 05:40   Images viewed by me.  MDM   Final diagnoses:  RLQ abdominal pain  Small bowel obstruction  Crohn's disease, with intestinal obstruction    Abdominal pain with nausea and vomiting consistent with exacerbation of Crohn's disease. She will be given IV fluids and IV ondansetron and morphine. Screening labs and x-rays have been ordered. Old records reviewed in she does have several ED visits for Crohn's disease, and has had to be hospitalized several times.  Abdominal x-rays appear more consistent with small bowel obstruction. She will need to be admitted. Case is discussed with Dr. Hal Hope of [facility] who agrees to admit the patient, but requests surgical consultation be obtained. Case is discussed with Dr. Zella Richer of [facility] surgery who agrees to see the patient in consultation.  Delora Fuel, MD 18/33/58 [id number]

## 2015-05-03 NOTE — ED Notes (Signed)
Pt states that she began to have rt lower abd pain and vomiting 3 hrs ago; pt with hx of Crohn's and pt states "I think it's my Crohn's"

## 2015-05-03 NOTE — H&P (Addendum)
[facility] History and Physical  Meghan Charles JJO:[id number] DOB: Jul 11, 1971 DOA: 05/03/2015  Referring physician:  PCP: PROVIDER NOT IN SYSTEM  Specialists:   Chief Complaint: abdominal pains, nausea, vomiting   HPI: Meghan Charles is a 44 y.o. female with PMH of Crohn's disease (dx-2015), h/o (Bl pneumonia/pleural effusion ? Related to Remicade in 2015), currently on imuran, and Entyvio (3rd dose 6 weeks ago), recently completed cipro/flagyl 10 days course on 5/14 presented with R sided non radiating 8/10 abdominal pains, associated with nausea, vomiting. Patient reports RLQ crampy abdominal pains, vomiting 5-6 times prior to admission, had two diarrhea like bowel movements last night. KUB done on ED: suspicious for SBO. ED d/w Dr. Zella Richer of [facility] surgery and being admitted by a hospitalist   -currently, patient reports feeling much better, no nausea, vomiting.   Review of Systems: The patient denies anorexia, fever, weight loss,, vision loss, decreased hearing, hoarseness, chest pain, syncope, dyspnea on exertion, peripheral edema, balance deficits, hemoptysis, abdominal pain, melena, hematochezia, severe indigestion/heartburn, hematuria, incontinence, genital sores, muscle weakness, suspicious skin lesions, transient blindness, difficulty walking, depression, unusual weight change, abnormal bleeding, enlarged lymph nodes, angioedema, and breast masses.    Past Medical History  Diagnosis Date  . Gallbladder polyp   . Anxiety   . Colon polyps 05/2014    TUBULAR ADENOMA AND HYPERPLASTIC POLYP.  . Crohn's disease   . Tubular adenoma of colon    Past Surgical History  Procedure Laterality Date  . Other surgical history      heel surgery  . Wisdom teeth extracted    . Right heel surgery      with plates and screws  . Colonoscopy w/ biopsies  2015   Social History:  reports that she has never smoked. She has never used smokeless tobacco. She reports that she  drinks alcohol. She reports that she does not use illicit drugs. Home;  where does patient live--home, ALF, SNF? and with whom if at home? Yes;  Can patient participate in ADLs?  No Known Allergies  Family History  Problem Relation Age of Onset  . Colon cancer Maternal Grandmother 109  . Esophageal cancer Neg Hx   . Stomach cancer Neg Hx   . Rectal cancer Neg Hx   . Pancreatic cancer Neg Hx     (be sure to complete)  Prior to Admission medications   Medication Sig Start Date End Date Taking? Authorizing Provider  azaTHIOprine (IMURAN) 50 MG tablet Take 1 tablet (50 mg total) by mouth daily. 01/21/15  Yes Willia Craze, NP  fluticasone (FLONASE) 50 MCG/ACT nasal spray Place 2 sprays into both nostrils daily. 03/26/15  Yes Brand Males, MD  hyoscyamine (LEVSIN SL) 0.125 MG SL tablet Place 0.125 mg under the tongue every 6 (six) hours as needed for cramping.   Yes Historical Provider, MD  ondansetron (ZOFRAN) 4 MG tablet Take 1 tablet (4 mg total) by mouth every 6 (six) hours. 02/15/15  Yes Jerene Bears, MD  vedolizumab (ENTYVIO) 300 MG injection Inject 300 mg into the vein every 8 (eight) weeks.   Yes Historical Provider, MD  ciprofloxacin (CIPRO) 500 MG tablet Take 1 tablet (500 mg total) by mouth 2 (two) times daily. Patient not taking: Reported on 05/03/2015 04/20/15   Jerene Bears, MD  Hydrocodone-Acetaminophen (VICODIN ES) 7.5-300 MG TABS Take 1-2 tablets by mouth every 6 hours as needed. Patient not taking: Reported on 05/03/2015 04/20/15   Jerene Bears, MD  metroNIDAZOLE (FLAGYL) 500  MG tablet Take 1 tablet (500 mg total) by mouth 3 (three) times daily. Patient not taking: Reported on 05/03/2015 04/20/15   Jerene Bears, MD  Olopatadine HCl 0.2 % SOLN Apply 1 drop to eye daily. Patient not taking: Reported on 05/03/2015 03/26/15   Brand Males, MD   Physical Exam: Filed Vitals:   05/03/15 0700  BP: 134/92  Pulse:   Temp:   Resp:      General:  Alert, oriented, no distress    Eyes: eom-i, perrla   ENT: no oral ulcers   Neck: supple, no JVD  Cardiovascular: s1,s2 rrr  Respiratory: CTA BL  Abdomen: soft, mild RLQ tender, no rebound. +bs  Skin: no rash   Musculoskeletal: no LE edema  Psychiatric: no hallucinations   Neurologic: CN 2-12 intact, motor 5/5 BL   Labs on Admission:  Basic Metabolic Panel:  Recent Labs Lab 05/03/15 0510  NA 138  K 3.7  CL 107  CO2 21*  GLUCOSE 142*  BUN 14  CREATININE 0.82  CALCIUM 9.3   Liver Function Tests:  Recent Labs Lab 05/03/15 0510  AST 28  ALT 21  ALKPHOS 39  BILITOT 0.8  PROT 7.8  ALBUMIN 4.5    Recent Labs Lab 05/03/15 0510  LIPASE 31   No results for input(s): AMMONIA in the last 168 hours. CBC:  Recent Labs Lab 05/03/15 0510  WBC 15.6*  NEUTROABS 13.7*  HGB 14.0  HCT 42.9  MCV 90.5  PLT 358   Cardiac Enzymes: No results for input(s): CKTOTAL, CKMB, CKMBINDEX, TROPONINI in the last 168 hours.  BNP (last 3 results) No results for input(s): BNP in the last 8760 hours.  ProBNP (last 3 results) No results for input(s): PROBNP in the last 8760 hours.  CBG: No results for input(s): GLUCAP in the last 168 hours.  Radiological Exams on Admission: Dg Abd Acute W/chest  05/03/2015   CLINICAL DATA:  New RIGHT mid abdominal pain with nausea vomiting. History of Crohn's.  EXAM: DG ABDOMEN ACUTE W/ 1V CHEST  COMPARISON:  Abdominal radiograph Apr 19, 2015, chest radiograph February 09, 2015 and CT of the chest December 21, 2014  FINDINGS: Cardiomediastinal silhouette is unremarkable. Similar chronic airspace opacities with scarring. No pleural effusion. No pneumothorax. Soft tissue planes and included osseous structures are nonsuspicious; bilateral breast implants.  Mildly dilated small bowel in LEFT upper quadrant of measures up to 3.5 cm with scattered air-fluid levels at varying levels, paucity of large bowel gas. No intra-abdominal mass effect or pathologic calcifications. No free  air. Soft tissue planes and included osseous structures are nonsuspicious.  IMPRESSION: No acute cardiopulmonary process ; stable chronic pulmonary parenchymal changes.  Findings concerning for early small bowel obstruction. As there are varying small bowel air-fluid levels, enteritis could have this appearance though, less favored considering paucity of large bowel gas.   Electronically Signed   By: Elon Alas   On: 05/03/2015 05:40    EKG: Independently reviewed.   Assessment/Plan Active Problems:   Small bowel obstruction   Crohn disease  44 y.o. female with PMH of Crohn's disease (dx-2015), h/o (Bl pneumonia/pleural effusion ? Related to Remicade in 2015), currently on imuran, and Entyvio (3rd dose 6 weeks ago), recently completed cipro/flagyl 10 days course on 5/14 presented with R sided abdominal pains, nausea, vomiting, diarrhea   1. Abdominal pains RLQ, nausea, vomiting, diarrhea. KUB: ? Early SBO. vs crohn's enteritis. abd exam focal tenderness in RLQ.   -will obtain CT abd/pelvis  for better evaluation. Keep NPO, cont IVF, antiemetics, pain control. F/u CT abd: if positive for crohn's exacerbation may need IV steroids. Consult GI eval. Surgery is following  -check stool for c diff, GI pathogen   2. Tachycardia on admission-resolved/likely pain/SBO induced. No cardiopulmonary symptoms. hemodynamically -stable.  Monitor  3. Leukocytosis likely SBO/enteritis related. Afebrile, lactic acid-1.06. Clinically stable.  -recently completed oral treatment course of cipro/flagyl on 5/14. Check stool c diff, Gi pathogen. Hold IV atx. recheck WBC in AM  4. Crohn's disease. As in #1. On Imuran, Entyvio  Surgery, GI. if consultant consulted, please document name and whether formally or informally consulted  Code Status: full (must indicate code status--if unknown or must be presumed, indicate so) Family Communication:  D/w patient, surgery (indicate person spoken with, if applicable, with  phone number if by telephone) Disposition Plan: home pend clinical improvement  (indicate anticipated LOS)  Time spent: >35 minutes   Kinnie Feil [facility] Pager [phone]  If 7PM-7AM, please contact night-coverage [email or web address] Password [facility] 05/03/2015, 7:31 AM

## 2015-05-04 ENCOUNTER — Encounter (HOSPITAL_COMMUNITY): Payer: BLUE CROSS/BLUE SHIELD

## 2015-05-04 ENCOUNTER — Inpatient Hospital Stay (HOSPITAL_COMMUNITY): Payer: BLUE CROSS/BLUE SHIELD

## 2015-05-04 DIAGNOSIS — K50912 Crohn's disease, unspecified, with intestinal obstruction: Secondary | ICD-10-CM

## 2015-05-04 DIAGNOSIS — R1031 Right lower quadrant pain: Secondary | ICD-10-CM

## 2015-05-04 LAB — COMPREHENSIVE METABOLIC PANEL
ALT: 18 U/L (ref 14–54)
ANION GAP: 8 (ref 5–15)
AST: 18 U/L (ref 15–41)
Albumin: 3.5 g/dL (ref 3.5–5.0)
Alkaline Phosphatase: 28 U/L — ABNORMAL LOW (ref 38–126)
BUN: 6 mg/dL (ref 6–20)
CALCIUM: 8.3 mg/dL — AB (ref 8.9–10.3)
CHLORIDE: 109 mmol/L (ref 101–111)
CO2: 22 mmol/L (ref 22–32)
Creatinine, Ser: 0.65 mg/dL (ref 0.44–1.00)
GFR calc Af Amer: >60 mL/min (ref >60)
GFR calc non Af Amer: >60 mL/min (ref >60)
GLUCOSE: 97 mg/dL (ref 65–99)
POTASSIUM: 3.4 mmol/L — AB (ref 3.5–5.1)
SODIUM: 139 mmol/L (ref 135–145)
TOTAL PROTEIN: 6.1 g/dL — AB (ref 6.5–8.1)
Total Bilirubin: 0.4 mg/dL (ref 0.3–1.2)

## 2015-05-04 LAB — CBC
HEMATOCRIT: 35.6 % — AB (ref 36.0–46.0)
Hemoglobin: 11.9 g/dL — ABNORMAL LOW (ref 12.0–15.0)
MCH: 30.3 pg (ref 26.0–34.0)
MCHC: 33.4 g/dL (ref 30.0–36.0)
MCV: 90.6 fL (ref 78.0–100.0)
Platelets: 292 10*3/uL (ref 150–400)
RBC: 3.93 MIL/uL (ref 3.87–5.11)
RDW: 12.8 % (ref 11.5–15.5)
WBC: 4.1 10*3/uL (ref 4.0–10.5)

## 2015-05-04 MED ORDER — ACETAMINOPHEN 325 MG PO TABS
650.0000 mg | ORAL_TABLET | Freq: Four times a day (QID) | ORAL | Status: DC | PRN
  Administered 2015-05-04 (×3): 650 mg via ORAL
  Filled 2015-05-04 (×3): qty 2

## 2015-05-04 MED ORDER — VEDOLIZUMAB 300 MG IV SOLR
300.0000 mg | Freq: Once | INTRAVENOUS | Status: AC
  Administered 2015-05-04: 300 mg via INTRAVENOUS
  Filled 2015-05-04: qty 5

## 2015-05-04 MED ORDER — PROMETHAZINE HCL 12.5 MG PO TABS: 12.5000 mg | ORAL_TABLET | Freq: Four times a day (QID) | ORAL | Status: DC | PRN

## 2015-05-04 NOTE — Progress Notes (Signed)
Patient ID: Meghan Charles, female   DOB: 22-Feb-1971, 44 y.o.   MRN: [id number]     [facility]      [address] [place], [place] [address]    Phone: [phone] FAX: [phone]     Subjective: Having loose BMs.  No pain.  No n/v.  VSS.  Afebrile.  WBC normal.   Objective:  Vital signs:  Filed Vitals:   05/03/15 0737 05/03/15 1401 05/03/15 2100 05/04/15 0608  BP: 131/86 131/97 149/96 120/78  Pulse: 90 94 97 87  Temp: 98.5 F (36.9 C) 98.2 F (36.8 C) 98.2 F (36.8 C) 98.1 F (36.7 C)  TempSrc:  Oral Oral Oral  Resp: _0 Height:      Weight:      SpO2: 97% 96% 99% 99%    Last BM Date: 05/03/15  Intake/Output   Yesterday:  05/16 0701 - 05/17 0700 In: 2937.5 [I.V.:2937.5] Out: 0  This shift:    I/O last 3 completed shifts: In: 2937.5 [I.V.:2937.5] Out: 0     Physical Exam: General: Pt awake/alert/oriented x4 in no acute distress Abdomen: Soft.  Nondistended.  Non tender.  No evidence of peritonitis.  No incarcerated hernias.    Problem List:   Active Problems:   Crohn disease   Small bowel obstruction   SBO (small bowel obstruction)    Results:   Labs: Results for orders placed or performed during the hospital encounter of 05/03/15 (from the past 48 hour(s))  Comprehensive metabolic panel     Status: Abnormal   Collection Time: 05/03/15  5:10 AM  Result Value Ref Range   Sodium 138 135 - 145 mmol/L   Potassium 3.7 3.5 - 5.1 mmol/L   Chloride 107 101 - 111 mmol/L   CO2 21 (L) 22 - 32 mmol/L   Glucose, Bld 142 (H) 65 - 99 mg/dL   BUN 14 6 - 20 mg/dL   Creatinine, Ser 0.82 0.44 - 1.00 mg/dL   Calcium 9.3 8.9 - 10.3 mg/dL   Total Protein 7.8 6.5 - 8.1 g/dL   Albumin 4.5 3.5 - 5.0 g/dL   AST 28 15 - 41 U/L   ALT 21 14 - 54 U/L   Alkaline Phosphatase 39 38 - 126 U/L   Total Bilirubin 0.8 0.3 - 1.2 mg/dL   GFR calc non Af Amer >60 >60 mL/min   GFR calc Af Amer >60 >60 mL/min     Comment: (NOTE) The eGFR has been calculated using the CKD EPI equation. This calculation has not been validated in all clinical situations. eGFR's persistently <60 mL/min signify possible Chronic Kidney Disease.    Anion gap 10 5 - 15  Lipase, blood     Status: None   Collection Time: 05/03/15  5:10 AM  Result Value Ref Range   Lipase 31 22 - 51 U/L  CBC with Differential     Status: Abnormal   Collection Time: 05/03/15  5:10 AM  Result Value Ref Range   WBC 15.6 (H) 4.0 - 10.5 K/uL   RBC 4.74 3.87 - 5.11 MIL/uL   Hemoglobin 14.0 12.0 - 15.0 g/dL   HCT 42.9 36.0 - 46.0 %   MCV 90.5 78.0 - 100.0 fL   MCH 29.5 26.0 - 34.0 pg   MCHC 32.6 30.0 - 36.0 g/dL   RDW 12.7 11.5 - 15.5 %   Platelets 358 150 - 400 K/uL   Neutrophils Relative % 89 (  H) 43 - 77 %   Neutro Abs 13.7 (H) 1.7 - 7.7 K/uL   Lymphocytes Relative 7 (L) 12 - 46 %   Lymphs Abs 1.1 0.7 - 4.0 K/uL   Monocytes Relative 4 3 - 12 %   Monocytes Absolute 0.6 0.1 - 1.0 K/uL   Eosinophils Relative 0 0 - 5 %   Eosinophils Absolute 0.1 0.0 - 0.7 K/uL   Basophils Relative 0 0 - 1 %   Basophils Absolute 0.0 0.0 - 0.1 K/uL  I-Stat CG4 Lactic Acid, ED     Status: None   Collection Time: 05/03/15  5:22 AM  Result Value Ref Range   Lactic Acid, Venous 1.06 0.5 - 2.0 mmol/L  POC urine preg, ED     Status: None   Collection Time: 05/03/15  7:07 AM  Result Value Ref Range   Preg Test, Ur NEGATIVE NEGATIVE    Comment:        THE SENSITIVITY OF THIS METHODOLOGY IS >24 mIU/mL   Clostridium Difficile by PCR     Status: None   Collection Time: 05/03/15  8:07 AM  Result Value Ref Range   C difficile by pcr NEGATIVE NEGATIVE  Urinalysis, Routine w reflex microscopic     Status: Abnormal   Collection Time: 05/03/15  7:40 PM  Result Value Ref Range   Color, Urine YELLOW YELLOW   APPearance CLEAR CLEAR   Specific Gravity, Urine 1.018 1.005 - 1.030   pH 6.0 5.0 - 8.0   Glucose, UA NEGATIVE NEGATIVE mg/dL   Hgb urine dipstick TRACE  (A) NEGATIVE   Bilirubin Urine NEGATIVE NEGATIVE   Ketones, ur NEGATIVE NEGATIVE mg/dL   Protein, ur NEGATIVE NEGATIVE mg/dL   Urobilinogen, UA 0.2 0.0 - 1.0 mg/dL   Nitrite NEGATIVE NEGATIVE   Leukocytes, UA NEGATIVE NEGATIVE  Urine microscopic-add on     Status: None   Collection Time: 05/03/15  7:40 PM  Result Value Ref Range   Squamous Epithelial / LPF RARE RARE   RBC / HPF 0-2 <3 RBC/hpf   Bacteria, UA RARE RARE  CBC     Status: Abnormal   Collection Time: 05/04/15  6:18 AM  Result Value Ref Range   WBC 4.1 4.0 - 10.5 K/uL   RBC 3.93 3.87 - 5.11 MIL/uL   Hemoglobin 11.9 (L) 12.0 - 15.0 g/dL   HCT 35.6 (L) 36.0 - 46.0 %   MCV 90.6 78.0 - 100.0 fL   MCH 30.3 26.0 - 34.0 pg   MCHC 33.4 30.0 - 36.0 g/dL   RDW 12.8 11.5 - 15.5 %   Platelets 292 150 - 400 K/uL  Comprehensive metabolic panel     Status: Abnormal   Collection Time: 05/04/15  6:18 AM  Result Value Ref Range   Sodium 139 135 - 145 mmol/L   Potassium 3.4 (L) 3.5 - 5.1 mmol/L   Chloride 109 101 - 111 mmol/L   CO2 22 22 - 32 mmol/L   Glucose, Bld 97 65 - 99 mg/dL   BUN 6 6 - 20 mg/dL   Creatinine, Ser 0.65 0.44 - 1.00 mg/dL   Calcium 8.3 (L) 8.9 - 10.3 mg/dL   Total Protein 6.1 (L) 6.5 - 8.1 g/dL   Albumin 3.5 3.5 - 5.0 g/dL   AST 18 15 - 41 U/L   ALT 18 14 - 54 U/L   Alkaline Phosphatase 28 (L) 38 - 126 U/L   Total Bilirubin 0.4 0.3 - 1.2 mg/dL  GFR calc non Af Amer >60 >60 mL/min   GFR calc Af Amer >60 >60 mL/min    Comment: (NOTE) The eGFR has been calculated using the CKD EPI equation. This calculation has not been validated in all clinical situations. eGFR's persistently <60 mL/min signify possible Chronic Kidney Disease.    Anion gap 8 5 - 15    Imaging / Studies: Ct Abdomen Pelvis W Contrast  05/03/2015   CLINICAL DATA:  44 year old female with a history of right lower quadrant pain for several days.  EXAM: CT ABDOMEN AND PELVIS WITH CONTRAST  TECHNIQUE: Multidetector CT imaging of the abdomen  and pelvis was performed using the standard protocol following bolus administration of intravenous contrast.  CONTRAST:  178m OMNIPAQUE IOHEXOL 300 MG/ML  SOLN  COMPARISON:  CT abdomen 12/26/2014, chest CT 12/21/2014, CT abdomen 09/22/2014  FINDINGS: Chest:  Surgical changes of prior breast augmentation.  Asymmetric appearance of the thoracic cage again evident.  Heart size within normal limits.  No pericardial fluid/ thickening.  No hiatal hernia.  No confluent airspace disease or pneumothorax.  Abdomen/ pelvis:  Unremarkable liver.  Unremarkable spleen.  Left adrenal gland a small. Nodule involving the medial limb of the right adrenal gland. 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  Unremarkable appearance of the bilateral kidneys.  Unremarkable appearance of the pancreas.  Unremarkable gallbladder.  No intra abdominal air. Small amount of free fluid layered within the pelvis, with low Hounsfield units.  Enteric contrast within stomach and small bowel, with contrast traversing the ileocecal valve. The colon is relatively decompressed with no adjacent inflammatory changes.  Borderline dilated small bowel loops within the mid and distal small bowel. There is a transition from dilated small bowel to decompressed small bowel just proximal to the distal ileum and ileocecal valve (image 69 of series 2). This segment of small bowel is identical to the location of previously identified narrowing on the CT 12/26/2014. Contrast does traverse this segment.  The enteric contrast present on the current study decreases the sensitivity for detection of hyper enhancement of the mucosa an bowel wall. No associated inflammation within the mesenteric. No significantly increased blood flow within the mesenteric vessels (negative "comb sign").  Normal appendix.  Unremarkable appearance of the urinary bladder. Unremarkable appearance of the adnexa.  No evidence of acute bony abnormality. No significant degenerative  changes. No sclerotic changes of the iliosacral joints. Unremarkable appearance of the proximal femurs.  IMPRESSION: Partial small bowel obstruction of the distal ileum with borderline proximal dilated small bowel loops. The transition point is located at the site of prior small bowel inflammation identified on CT 12/26/2014, compatible with developing stricture. The presence of enteric contrast on the current CT decreases the sensitivity for evaluation of the bowel wall, although the free fluid within the pelvis indicates degree of active inflammation.  Small size of the left adrenal gland may reflect adrenal insufficiency. Correlation with a history of longstanding steroid use recommended.  Signed,  JDulcy Fanny WEarleen Newport DO  [facility]  [facility]   Electronically Signed   By: JCorrie MckusickD.O.   On: 05/03/2015 11:48   Dg Abd Acute W/chest  05/03/2015   CLINICAL DATA:  New RIGHT mid abdominal pain with nausea vomiting. History of Crohn's.  EXAM: DG ABDOMEN ACUTE W/ 1V CHEST  COMPARISON:  Abdominal radiograph Apr 19, 2015, chest radiograph February 09, 2015 and CT of the chest December 21, 2014  FINDINGS: Cardiomediastinal silhouette is unremarkable.  Similar chronic airspace opacities with scarring. No pleural effusion. No pneumothorax. Soft tissue planes and included osseous structures are nonsuspicious; bilateral breast implants.  Mildly dilated small bowel in LEFT upper quadrant of measures up to 3.5 cm with scattered air-fluid levels at varying levels, paucity of large bowel gas. No intra-abdominal mass effect or pathologic calcifications. No free air. Soft tissue planes and included osseous structures are nonsuspicious.  IMPRESSION: No acute cardiopulmonary process ; stable chronic pulmonary parenchymal changes.  Findings concerning for early small bowel obstruction. As there are varying small bowel air-fluid levels, enteritis could have this appearance though, less  favored considering paucity of large bowel gas.   Electronically Signed   By: Elon Alas   On: 05/03/2015 05:40    Medications / Allergies:  Scheduled Meds: . azaTHIOprine  50 mg Oral Daily  . enoxaparin (LOVENOX) injection  40 mg Subcutaneous Q24H  . fluticasone  2 spray Each Nare Daily  . olopatadine  1 drop Both Eyes BID   Continuous Infusions: . sodium chloride 1,000 mL (05/04/15 0107)   PRN Meds:.hyoscyamine, morphine injection, ondansetron **OR** ondansetron (ZOFRAN) IV  Antibiotics: Anti-infectives    None        Assessment/Plan  Crohn's disease  PSBO 2/2 stricture at TI  She has failed medical management.  We therefore recommend surgical resection.  The patient would like to hold off on surgery about 2 weeks.  Again, we do not recommend this.  If she continues to decline, then her diet can be advanced.  I will discuss with Dr. Excell Seltzer if we can schedule her electively, however, scheduling may be an issue.    Erby Pian, [facility] Pager [phone](7A-4:30P)   05/04/2015 8:26 AM

## 2015-05-04 NOTE — Discharge Summary (Addendum)
Discharge Summary  Meghan Charles WTU:[id number] DOB: 1971/02/23  PCP: PROVIDER NOT IN SYSTEM  Admit date: 05/03/2015 Discharge date: 05/04/2015  Time spent: 25 minutes  Recommendations for Outpatient Follow-up:  1. New medication: Phenergan 12.5 mg every 6 hours as needed for nausea 2. Patient will follow-up with Gen. surgery in the next 2 weeks as an outpatient to schedule ileal resection  Discharge Diagnoses:  Active Hospital Problems   Diagnosis Date Noted  . Small bowel obstruction 05/03/2015  . Crohn disease     Resolved Hospital Problems   Diagnosis Date Noted Date Resolved  No resolved problems to display.    Discharge Condition: Improved, being discharged home. 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 CT of abdomen and pelvis noted partial small bowel obstruction. Elevated white blood cell count of 14, but lactic acid level normal.  Hospital Course:  Active Problems:  Ileal Crohn disease with small bowel obstruction: Patient has had recurrent problems with abdominal pain. Initially made nothing by mouth and put on IV fluids. Seen by gastroenterology who felt that her inflammatory component has significantly improved. She has been on them immunomodulators for the past 5 months. GI as her condition was for surgical evaluation for small bowel resection. Patient seen by general surgery who recommended the same. Patient however was adamantly opposed to getting surgery at this time. She stated that there was a number of factors at work that she needed to take care of before being  able to take time off. Even know she was advised against this, she insisted.   Pain somewhat better. Patient passing flatus and had some liquid diarrhea. Diet was advanced and patient tolerating clears. I will be advanced to full liquids. She is tolerating this, the plan will be to discharge patient to home on full liquid diet only from here on out. She will follow up in general surgery as outpatient and at that time can schedule for ileal resection. She has been advised that should any type of abdominal pain or nausea vomiting recur, she will need to come back to the emergency room immediately.  Leukocytosis resolved on its own without antibiotics. Suspected to be secondary to stress margination.   Procedures:  None  Consultations:  Gastroenterology  General surgery  Discharge Exam: BP 142/95 mmHg  Pulse 87  Temp(Src) 98.2 F (36.8 C) (Oral)  Resp 18  Ht 5' 8"  (1.727 m)  Wt 64.411 kg (142 lb)  BMI 21.60 kg/m2  SpO2 98%  General: Alert and oriented 3, no acute distress Cardiovascular: Regular rate and rhythm, S1-S2 Respiratory: Clear to auscultation bilaterally Abdomen: Soft, nontender, nondistended, normoactive bowel sounds  Discharge Instructions You were cared for by a hospitalist during your hospital stay. If you have any questions about your discharge medications or the care you received while you were in the hospital after you are discharged, you can call the unit and asked to speak with the hospitalist on call if the hospitalist that took care of you is not available. Once you are discharged, your primary care physician will handle any further medical issues. Please note that NO REFILLS for any discharge  medications will be authorized once you are discharged, as it is imperative that you return to your primary care physician (or establish a relationship with a primary care physician if you do not have one) for your aftercare needs so that they can reassess your need for  medications and monitor your lab values.  Discharge Instructions    Increase activity slowly    Complete by:  As directed             Medication List    STOP taking these medications        ciprofloxacin 500 MG tablet  Commonly known as:  CIPRO     metroNIDAZOLE 500 MG tablet  Commonly known as:  FLAGYL     Olopatadine HCl 0.2 % Soln      TAKE these medications        azaTHIOprine 50 MG tablet  Commonly known as:  IMURAN  Take 1 tablet (50 mg total) by mouth daily.     fluticasone 50 MCG/ACT nasal spray  Commonly known as:  FLONASE  Place 2 sprays into both nostrils daily.     Hydrocodone-Acetaminophen 7.5-300 MG Tabs  Commonly known as:  VICODIN ES  Take 1-2 tablets by mouth every 6 hours as needed.     hyoscyamine 0.125 MG SL tablet  Commonly known as:  LEVSIN SL  Place 0.125 mg under the tongue every 6 (six) hours as needed for cramping.     ondansetron 4 MG tablet  Commonly known as:  ZOFRAN  Take 1 tablet (4 mg total) by mouth every 6 (six) hours.     promethazine 12.5 MG tablet  Commonly known as:  PHENERGAN  Take 1 tablet (12.5 mg total) by mouth every 6 (six) hours as needed for nausea or vomiting.     vedolizumab 300 MG injection  Commonly known as:  ENTYVIO  Inject 300 mg into the vein every 8 (eight) weeks.       No Known Allergies     Follow-up Information    Follow up with Edward Jolly, MD On 05/12/2015.   Specialty:  General Surgery   Why:  arrive by 2:15, office on 3rd floor   Contact information:   [address] [place] [address] [phone]       Follow up with Hvozdovic, Vita Barley, PA-C On 05/13/2015.   Specialty:  Physician Assistant   Why:  appt at 1:15--be there 1:00 please   Contact information:   [place] [address] [phone]        The results of significant diagnostics from this hospitalization (including imaging, microbiology, ancillary and laboratory) are listed below for  reference.    Significant Diagnostic Studies: Ct Abdomen Pelvis W Contrast  05/03/2015   CLINICAL DATA:  44 year old female with a history of right lower quadrant pain for several days.  EXAM: CT ABDOMEN AND PELVIS WITH CONTRAST  TECHNIQUE: Multidetector CT imaging of the abdomen and pelvis was performed using the standard protocol following bolus administration of intravenous contrast.  CONTRAST:  126m OMNIPAQUE IOHEXOL 300 MG/ML  SOLN  COMPARISON:  CT abdomen 12/26/2014, chest CT 12/21/2014, CT abdomen 09/22/2014  FINDINGS: Chest:  Surgical changes of prior breast augmentation.  Asymmetric appearance of the thoracic cage again evident.  Heart size within normal limits.  No pericardial fluid/ thickening.  No hiatal hernia.  No confluent airspace disease or pneumothorax.  Abdomen/ pelvis:  Unremarkable liver.  Unremarkable spleen.  Left adrenal gland  a small. Nodule involving the medial limb of the right adrenal gland. 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  Unremarkable appearance of the bilateral kidneys.  Unremarkable appearance of the pancreas.  Unremarkable gallbladder.  No intra abdominal air. Small amount of free fluid layered within the pelvis, with low Hounsfield units.  Enteric contrast within stomach and small bowel, with contrast traversing the ileocecal valve. The colon is relatively decompressed with no adjacent inflammatory changes.  Borderline dilated small bowel loops within the mid and distal small bowel. There is a transition from dilated small bowel to decompressed small bowel just proximal to the distal ileum and ileocecal valve (image 69 of series 2). This segment of small bowel is identical to the location of previously identified narrowing on the CT 12/26/2014. Contrast does traverse this segment.  The enteric contrast present on the current study decreases the sensitivity for detection of hyper enhancement of the mucosa an bowel wall. No associated inflammation  within the mesenteric. No significantly increased blood flow within the mesenteric vessels (negative "comb sign").  Normal appendix.  Unremarkable appearance of the urinary bladder. Unremarkable appearance of the adnexa.  No evidence of acute bony abnormality. No significant degenerative changes. No sclerotic changes of the iliosacral joints. Unremarkable appearance of the proximal femurs.  IMPRESSION: Partial small bowel obstruction of the distal ileum with borderline proximal dilated small bowel loops. The transition point is located at the site of prior small bowel inflammation identified on CT 12/26/2014, compatible with developing stricture. The presence of enteric contrast on the current CT decreases the sensitivity for evaluation of the bowel wall, although the free fluid within the pelvis indicates degree of active inflammation.  Small size of the left adrenal gland may reflect adrenal insufficiency. Correlation with a history of longstanding steroid use recommended.  Signed,  Dulcy Fanny. Earleen Newport, DO  [facility]  [facility]   Electronically Signed   By: Corrie Mckusick D.O.   On: 05/03/2015 11:48   Dg Abd 2 Views  05/04/2015   CLINICAL DATA:  Right lower quadrant pain for 2 days  EXAM: ABDOMEN - 2 VIEW  COMPARISON:  05/03/2015  FINDINGS: Scattered large and small bowel gas is noted. Mild residual prominence of the small bowel is noted. Contrast material is noted within the colon consistent with the recent CT examination. No free air is seen.  IMPRESSION: Slight residual small bowel dilatation. The previously seen changes were likely related to a partial small bowel obstruction   Electronically Signed   By: Inez Catalina M.D.   On: 05/04/2015 14:09   Dg Abd 2 Views  04/19/2015   CLINICAL DATA:  Right lower quadrant abdominal pain for 4 days. Nausea.  EXAM: ABDOMEN - 2 VIEW  COMPARISON:  01/15/2015  FINDINGS: No evidence of bowel obstruction or generalized  adynamic ileus. Mild increased stool in the colon. No free air.  No evidence of renal or ureteral stones. Soft tissues are unremarkable. No significant bony abnormality.  IMPRESSION: 1. No acute findings. No evidence of bowel obstruction or generalized adynamic ileus. No free air. 2. Mild increased stool in the colon.   Electronically Signed   By: Lajean Manes M.D.   On: 04/19/2015 11:06   Dg Abd Acute W/chest  05/03/2015   CLINICAL DATA:  New RIGHT mid abdominal pain with nausea vomiting. History of Crohn's.  EXAM: DG ABDOMEN ACUTE W/ 1V CHEST  COMPARISON:  Abdominal radiograph Apr 19, 2015, chest radiograph February 09, 2015 and CT of the chest December 21, 2014  FINDINGS: Cardiomediastinal silhouette is unremarkable. Similar chronic airspace opacities with scarring. No pleural effusion. No pneumothorax. Soft tissue planes and included osseous structures are nonsuspicious; bilateral breast implants.  Mildly dilated small bowel in LEFT upper quadrant of measures up to 3.5 cm with scattered air-fluid levels at varying levels, paucity of large bowel gas. No intra-abdominal mass effect or pathologic calcifications. No free air. Soft tissue planes and included osseous structures are nonsuspicious.  IMPRESSION: No acute cardiopulmonary process ; stable chronic pulmonary parenchymal changes.  Findings concerning for early small bowel obstruction. As there are varying small bowel air-fluid levels, enteritis could have this appearance though, less favored considering paucity of large bowel gas.   Electronically Signed   By: Elon Alas   On: 05/03/2015 05:40    Microbiology: Recent Results (from the past 240 hour(s))  Clostridium Difficile by PCR     Status: None   Collection Time: 05/03/15  8:07 AM  Result Value Ref Range Status   C difficile by pcr NEGATIVE NEGATIVE Final     Labs: Basic Metabolic Panel:  Recent Labs Lab 05/03/15 0510 05/04/15 0618  NA 138 139  K 3.7 3.4*  CL 107 109  CO2 21*  22  GLUCOSE 142* 97  BUN 14 6  CREATININE 0.82 0.65  CALCIUM 9.3 8.3*   Liver Function Tests:  Recent Labs Lab 05/03/15 0510 05/04/15 0618  AST 28 18  ALT 21 18  ALKPHOS 39 28*  BILITOT 0.8 0.4  PROT 7.8 6.1*  ALBUMIN 4.5 3.5    Recent Labs Lab 05/03/15 0510  LIPASE 31   No results for input(s): AMMONIA in the last 168 hours. CBC:  Recent Labs Lab 05/03/15 0510 05/04/15 0618  WBC 15.6* 4.1  NEUTROABS 13.7*  --   HGB 14.0 11.9*  HCT 42.9 35.6*  MCV 90.5 90.6  PLT 358 292     Signed:  Travia Onstad K  [facility] 05/04/2015, 3:52 PM

## 2015-05-04 NOTE — Progress Notes (Signed)
[facility] Progress Note  Subjective:  Less pain, no nausea or vomiting. Passing gas. Pt voices understanding that she needs surgery, but is adamant that she wants it as outpt as she has to "get a few things in order" before she proceeds with surgery.Was due for vedolizumab today.   Objective:  Vital signs in last 24 hours: Temp:  [98.1 F (36.7 C)-98.2 F (36.8 C)] 98.1 F (36.7 C) (05/17 0608) Pulse Rate:  [87-97] 87 (05/17 0608) Resp:  [16-18] 16 (05/17 0608) BP: (120-149)/(78-97) 120/78 mmHg (05/17 0608) SpO2:  [96 %-99 %] 99 % (05/17 0608) Last BM Date: 05/03/15 General:   Alert,  Well-developed,    in NAD Heart:  Regular rate and rhythm; no murmurs Pulm;lungs clear Abdomen:  Soft, nontender and nondistended. Normal bowel sounds, without guarding, and without rebound.   Extremities:  Without edema. Neurologic:Alert and  oriented x4;  grossly normal neurologically. Psych: Alert and cooperative. Normal mood and affect.  Intake/Output from previous day: 05/16 0701 - 05/17 0700 In: 2937.5 [I.V.:2937.5] Out: 0  Intake/Output this shift:    Lab Results:  Recent Labs  05/03/15 0510 05/04/15 0618  WBC 15.6* 4.1  HGB 14.0 11.9*  HCT 42.9 35.6*  PLT 358 292   BMET  Recent Labs  05/03/15 0510 05/04/15 0618  NA 138 139  K 3.7 3.4*  CL 107 109  CO2 21* 22  GLUCOSE 142* 97  BUN 14 6  CREATININE 0.82 0.65  CALCIUM 9.3 8.3*   LFT  Recent Labs  05/04/15 0618  PROT 6.1*  ALBUMIN 3.5  AST 18  ALT 18  ALKPHOS 28*  BILITOT 0.4   Ct Abdomen Pelvis W Contrast  05/03/2015   CLINICAL DATA:  44 year old female with a history of right lower quadrant pain for several days.  EXAM: CT ABDOMEN AND PELVIS WITH CONTRAST  TECHNIQUE: Multidetector CT imaging of the abdomen and pelvis was performed using the standard protocol following bolus administration of intravenous contrast.  CONTRAST:  141m OMNIPAQUE IOHEXOL 300 MG/ML  SOLN  COMPARISON:  CT abdomen  12/26/2014, chest CT 12/21/2014, CT abdomen 09/22/2014  FINDINGS: Chest:  Surgical changes of prior breast augmentation.  Asymmetric appearance of the thoracic cage again evident.  Heart size within normal limits.  No pericardial fluid/ thickening.  No hiatal hernia.  No confluent airspace disease or pneumothorax.  Abdomen/ pelvis:  Unremarkable liver.  Unremarkable spleen.  Left adrenal gland a small. Nodule involving the medial limb of the right adrenal gland. 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  Unremarkable appearance of the bilateral kidneys.  Unremarkable appearance of the pancreas.  Unremarkable gallbladder.  No intra abdominal air. Small amount of free fluid layered within the pelvis, with low Hounsfield units.  Enteric contrast within stomach and small bowel, with contrast traversing the ileocecal valve. The colon is relatively decompressed with no adjacent inflammatory changes.  Borderline dilated small bowel loops within the mid and distal small bowel. There is a transition from dilated small bowel to decompressed small bowel just proximal to the distal ileum and ileocecal valve (image 69 of series 2). This segment of small bowel is identical to the location of previously identified narrowing on the CT 12/26/2014. Contrast does traverse this segment.  The enteric contrast present on the current study decreases the sensitivity for detection of hyper enhancement of the mucosa an bowel wall. No associated inflammation within the mesenteric. No significantly increased blood flow within the mesenteric vessels (negative "  comb sign").  Normal appendix.  Unremarkable appearance of the urinary bladder. Unremarkable appearance of the adnexa.  No evidence of acute bony abnormality. No significant degenerative changes. No sclerotic changes of the iliosacral joints. Unremarkable appearance of the proximal femurs.  IMPRESSION: Partial small bowel obstruction of the distal ileum with borderline  proximal dilated small bowel loops. The transition point is located at the site of prior small bowel inflammation identified on CT 12/26/2014, compatible with developing stricture. The presence of enteric contrast on the current CT decreases the sensitivity for evaluation of the bowel wall, although the free fluid within the pelvis indicates degree of active inflammation.  Small size of the left adrenal gland may reflect adrenal insufficiency. Correlation with a history of longstanding steroid use recommended.  Signed,  Dulcy Fanny. Earleen Newport, DO  [facility]  [facility]   Electronically Signed   By: Corrie Mckusick D.O.   On: 05/03/2015 11:48   Dg Abd Acute W/chest  05/03/2015   CLINICAL DATA:  New RIGHT mid abdominal pain with nausea vomiting. History of Crohn's.  EXAM: DG ABDOMEN ACUTE W/ 1V CHEST  COMPARISON:  Abdominal radiograph Apr 19, 2015, chest radiograph February 09, 2015 and CT of the chest December 21, 2014  FINDINGS: Cardiomediastinal silhouette is unremarkable. Similar chronic airspace opacities with scarring. No pleural effusion. No pneumothorax. Soft tissue planes and included osseous structures are nonsuspicious; bilateral breast implants.  Mildly dilated small bowel in LEFT upper quadrant of measures up to 3.5 cm with scattered air-fluid levels at varying levels, paucity of large bowel gas. No intra-abdominal mass effect or pathologic calcifications. No free air. Soft tissue planes and included osseous structures are nonsuspicious.  IMPRESSION: No acute cardiopulmonary process ; stable chronic pulmonary parenchymal changes.  Findings concerning for early small bowel obstruction. As there are varying small bowel air-fluid levels, enteritis could have this appearance though, less favored considering paucity of large bowel gas.   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 Pt understands she would benefit most from ieal resection, but wants to schedule this as elective outpt procedure in the near future.  Will give trial of clear liquids. Check abd films.      LOS: 1 day   Hvozdovic, Vita Barley PA-C 05/04/2015, Pager [phone]  2:45 pm addendum:abd xray with slight residual small bowel dilatation. Pt understands importance of surgery but is adamant she needs to go home first and get things in order. Have ordered entyvio--pharmacy checking on inpatient coverage. (she is due for her infusion today). Has appt to f/u with GI next week and appt to f/u with surgery as well.Upon discharge pt will need to be on full liquids only.  GI ATTENDING  Interval history data reviewed. Patient personally seen and examined. Agree with interval progress note. Clinically stable without vomiting and tolerating minimal liquids. Still not feeling well and tender on exam. Patient's desire to leave the hospital for personal reasons as stated. We are arranging for her to have vidolizumab prior to discharge. Surgical follow-up has been arranged. GI follow-up as well.  Docia Chuck. Geri Seminole., M.D. [facility] of Gastroenterology

## 2015-05-05 LAB — GI PATHOGEN PANEL BY PCR, STOOL
C difficile toxin A/B: NOT DETECTED
CAMPYLOBACTER BY PCR: NOT DETECTED
Cryptosporidium by PCR: NOT DETECTED
E coli (ETEC) LT/ST: NOT DETECTED
E coli (STEC): NOT DETECTED
E coli 0157 by PCR: NOT DETECTED
G lamblia by PCR: NOT DETECTED
Norovirus GI/GII: NOT DETECTED
ROTAVIRUS A BY PCR: NOT DETECTED
Salmonella by PCR: NOT DETECTED
Shigella by PCR: NOT DETECTED

## 2015-05-05 MED ORDER — ONDANSETRON HCL 4 MG PO TABS: 4.0000 mg | ORAL_TABLET | Freq: Four times a day (QID) | ORAL | Status: DC

## 2015-05-05 MED ORDER — HYOSCYAMINE SULFATE 0.125 MG SL SUBL: 0.1250 mg | SUBLINGUAL_TABLET | Freq: Four times a day (QID) | SUBLINGUAL | Status: DC | PRN

## 2015-05-05 MED ORDER — PROMETHAZINE HCL 12.5 MG PO TABS: 12.5000 mg | ORAL_TABLET | Freq: Four times a day (QID) | ORAL | Status: DC | PRN

## 2015-05-05 MED ORDER — HYDROCODONE-ACETAMINOPHEN 7.5-300 MG PO TABS: ORAL_TABLET | ORAL | Status: DC

## 2015-05-05 NOTE — Progress Notes (Signed)
Note given to patient for work

## 2015-05-05 NOTE — Progress Notes (Signed)
Discharge instructions and prescription given to patient.  Questions answered

## 2015-05-05 NOTE — Progress Notes (Signed)
     [facility] Progress Note  Subjective:   Rec entyvio infusion last pm. Tol full liquids. Would like to go home. Asking for prn med for anxiety til she has her surgery.Had low grade temp early this morning. No c/o. No abd pain, nausea or vomiting. WBC bumped to 15 yesterday, down to 4 this morning.   Objective:  Vital signs in last 24 hours: Temp:  [98.2 F (36.8 C)-99.5 F (37.5 C)] 99.5 F (37.5 C) (05/18 0550) Pulse Rate:  [87-91] 91 (05/18 0550) Resp:  [16-18] 16 (05/18 0550) BP: (113-142)/(64-95) 113/64 mmHg (05/18 0550) SpO2:  [97 %-99 %] 97 % (05/18 0550) Last BM Date: 05/03/15 General:   Alert,  Well-developed,  in NAD Heart:  Regular rate and rhythm; no murmurs Pulm;lungs clear Abdomen:  Soft, nontender and nondistended. Normal bowel sounds, without guarding, and without rebound.   Extremities:  Without edema. Neurologic: Alert and  oriented x4;  grossly normal neurologically. Psych: Alert and cooperative. Normal mood and affect.  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 Pt understands she would benefit most from ileal resection, but wants to schedule this as elective outpt procedure in the near future.Has tol full liquids.Rec her entyvio infusion last pm. From Gi standpoint, can likely go home today on full  liquids. Would continue cipro and flagyl x 7 more days. Continue imuran, antiemetics.Would  Send on  levsin 0.25 tid prn cramps as well. Pt has appt in GI office 5/26 and with surgery 5/25.Pt would like something for anxiety as well til she has her surgery.     LOS: 2 days   Hvozdovic, Vita Barley PA-C 05/05/2015, Pager [phone]  GI ATTENDING  Interval history and data reviewed. Agree with interval progress note as outlined above. Patient's medications and follow-up plans as well. Anticipate discharge today. Will sign off.  Docia Chuck. Geri Seminole., M.D. [facility] of Gastroenterology

## 2015-05-09 ENCOUNTER — Inpatient Hospital Stay (HOSPITAL_COMMUNITY)
Admission: EM | Admit: 2015-05-09 | Discharge: 2015-05-18 | DRG: 345 | Disposition: A | Discharge status: Home / Self Care | Payer: BLUE CROSS/BLUE SHIELD | Attending: Internal Medicine | Admitting: Internal Medicine

## 2015-05-09 ENCOUNTER — Emergency Department (HOSPITAL_COMMUNITY): Payer: BLUE CROSS/BLUE SHIELD

## 2015-05-09 ENCOUNTER — Encounter (HOSPITAL_COMMUNITY): Payer: Self-pay | Admitting: *Deleted

## 2015-05-09 DIAGNOSIS — K50918 Crohn's disease, unspecified, with other complication: Secondary | ICD-10-CM

## 2015-05-09 DIAGNOSIS — K9189 Other postprocedural complications and disorders of digestive system: Secondary | ICD-10-CM

## 2015-05-09 DIAGNOSIS — K50012 Crohn's disease of small intestine with intestinal obstruction: Principal | ICD-10-CM | POA: Diagnosis present

## 2015-05-09 DIAGNOSIS — I10 Essential (primary) hypertension: Secondary | ICD-10-CM | POA: Diagnosis present

## 2015-05-09 DIAGNOSIS — R1031 Right lower quadrant pain: Secondary | ICD-10-CM | POA: Insufficient documentation

## 2015-05-09 DIAGNOSIS — Z800 Family history of malignant neoplasm of digestive organs: Secondary | ICD-10-CM

## 2015-05-09 DIAGNOSIS — K5090 Crohn's disease, unspecified, without complications: Secondary | ICD-10-CM | POA: Diagnosis not present

## 2015-05-09 DIAGNOSIS — Z79899 Other long term (current) drug therapy: Secondary | ICD-10-CM

## 2015-05-09 DIAGNOSIS — K56609 Unspecified intestinal obstruction, unspecified as to partial versus complete obstruction: Secondary | ICD-10-CM | POA: Diagnosis present

## 2015-05-09 DIAGNOSIS — K5669 Other intestinal obstruction: Secondary | ICD-10-CM | POA: Diagnosis not present

## 2015-05-09 DIAGNOSIS — Z86010 Personal history of colonic polyps: Secondary | ICD-10-CM

## 2015-05-09 DIAGNOSIS — K913 Postprocedural intestinal obstruction: Secondary | ICD-10-CM | POA: Diagnosis not present

## 2015-05-09 DIAGNOSIS — K50919 Crohn's disease, unspecified, with unspecified complications: Secondary | ICD-10-CM | POA: Diagnosis not present

## 2015-05-09 DIAGNOSIS — R109 Unspecified abdominal pain: Secondary | ICD-10-CM | POA: Diagnosis present

## 2015-05-09 DIAGNOSIS — E43 Unspecified severe protein-calorie malnutrition: Secondary | ICD-10-CM | POA: Diagnosis not present

## 2015-05-09 DIAGNOSIS — K567 Ileus, unspecified: Secondary | ICD-10-CM | POA: Insufficient documentation

## 2015-05-09 DIAGNOSIS — K50912 Crohn's disease, unspecified, with intestinal obstruction: Secondary | ICD-10-CM | POA: Diagnosis not present

## 2015-05-09 DIAGNOSIS — R1030 Lower abdominal pain, unspecified: Secondary | ICD-10-CM | POA: Diagnosis not present

## 2015-05-09 LAB — URINE MICROSCOPIC-ADD ON

## 2015-05-09 LAB — COMPREHENSIVE METABOLIC PANEL
ALBUMIN: 4.8 g/dL (ref 3.5–5.0)
ALT: 20 U/L (ref 14–54)
ANION GAP: 13 (ref 5–15)
AST: 20 U/L (ref 15–41)
Alkaline Phosphatase: 41 U/L (ref 38–126)
BUN: 11 mg/dL (ref 6–20)
CO2: 25 mmol/L (ref 22–32)
CREATININE: 0.79 mg/dL (ref 0.44–1.00)
Calcium: 10.3 mg/dL (ref 8.9–10.3)
Chloride: 102 mmol/L (ref 101–111)
GFR calc non Af Amer: >60 mL/min (ref >60)
Glucose, Bld: 136 mg/dL — ABNORMAL HIGH (ref 65–99)
Potassium: 3.9 mmol/L (ref 3.5–5.1)
Sodium: 140 mmol/L (ref 135–145)
Total Bilirubin: 1.1 mg/dL (ref 0.3–1.2)
Total Protein: 8.7 g/dL — ABNORMAL HIGH (ref 6.5–8.1)

## 2015-05-09 LAB — CBC
HEMATOCRIT: 40.7 % (ref 36.0–46.0)
Hemoglobin: 13.4 g/dL (ref 12.0–15.0)
MCH: 29.8 pg (ref 26.0–34.0)
MCHC: 32.9 g/dL (ref 30.0–36.0)
MCV: 90.6 fL (ref 78.0–100.0)
Platelets: 354 10*3/uL (ref 150–400)
RBC: 4.49 MIL/uL (ref 3.87–5.11)
RDW: 12.5 % (ref 11.5–15.5)
WBC: 8.1 10*3/uL (ref 4.0–10.5)

## 2015-05-09 LAB — URINALYSIS, ROUTINE W REFLEX MICROSCOPIC
Glucose, UA: NEGATIVE mg/dL
Ketones, ur: >80 mg/dL — AB
NITRITE: NEGATIVE
PH: 6 (ref 5.0–8.0)
PROTEIN: 30 mg/dL — AB
Specific Gravity, Urine: 1.026 (ref 1.005–1.030)
Urobilinogen, UA: 1 mg/dL (ref 0.0–1.0)

## 2015-05-09 LAB — CBC WITH DIFFERENTIAL/PLATELET
BASOS PCT: 0 % (ref 0–1)
Basophils Absolute: 0 10*3/uL (ref 0.0–0.1)
Eosinophils Absolute: 0 10*3/uL (ref 0.0–0.7)
Eosinophils Relative: 0 % (ref 0–5)
HCT: 43.1 % (ref 36.0–46.0)
HEMOGLOBIN: 14.5 g/dL (ref 12.0–15.0)
Lymphocytes Relative: 6 % — ABNORMAL LOW (ref 12–46)
Lymphs Abs: 0.6 10*3/uL — ABNORMAL LOW (ref 0.7–4.0)
MCH: 30.1 pg (ref 26.0–34.0)
MCHC: 33.6 g/dL (ref 30.0–36.0)
MCV: 89.6 fL (ref 78.0–100.0)
MONO ABS: 0.2 10*3/uL (ref 0.1–1.0)
Monocytes Relative: 2 % — ABNORMAL LOW (ref 3–12)
Neutro Abs: 9.2 10*3/uL — ABNORMAL HIGH (ref 1.7–7.7)
Neutrophils Relative %: 92 % — ABNORMAL HIGH (ref 43–77)
PLATELETS: 353 10*3/uL (ref 150–400)
RBC: 4.81 MIL/uL (ref 3.87–5.11)
RDW: 12.4 % (ref 11.5–15.5)
WBC: 10 10*3/uL (ref 4.0–10.5)

## 2015-05-09 LAB — CREATININE, SERUM
Creatinine, Ser: 0.66 mg/dL (ref 0.44–1.00)
GFR calc Af Amer: >60 mL/min (ref >60)
GFR calc non Af Amer: >60 mL/min (ref >60)

## 2015-05-09 LAB — PREGNANCY, URINE: PREG TEST UR: NEGATIVE

## 2015-05-09 LAB — LIPASE, BLOOD: Lipase: 28 U/L (ref 22–51)

## 2015-05-09 LAB — PROTIME-INR
INR: 1.09 (ref 0.00–1.49)
Prothrombin Time: 14.3 seconds (ref 11.6–15.2)

## 2015-05-09 LAB — I-STAT CG4 LACTIC ACID, ED: Lactic Acid, Venous: 1.37 mmol/L (ref 0.5–2.0)

## 2015-05-09 MED ORDER — ENOXAPARIN SODIUM 40 MG/0.4ML ~~LOC~~ SOLN
40.0000 mg | SUBCUTANEOUS | Status: DC
  Administered 2015-05-09 – 2015-05-11 (×3): 40 mg via SUBCUTANEOUS
  Filled 2015-05-09 (×4): qty 0.4

## 2015-05-09 MED ORDER — HYDROMORPHONE HCL 1 MG/ML IJ SOLN
1.0000 mg | Freq: Once | INTRAMUSCULAR | Status: AC
  Administered 2015-05-09: 1 mg via INTRAVENOUS
  Filled 2015-05-09: qty 1

## 2015-05-09 MED ORDER — CIPROFLOXACIN IN D5W 400 MG/200ML IV SOLN
400.0000 mg | Freq: Two times a day (BID) | INTRAVENOUS | Status: DC
  Administered 2015-05-09 – 2015-05-12 (×7): 400 mg via INTRAVENOUS
  Filled 2015-05-09 (×9): qty 200

## 2015-05-09 MED ORDER — HYDROCODONE-ACETAMINOPHEN 5-325 MG PO TABS: 1.0000 | ORAL_TABLET | ORAL | Status: DC | PRN

## 2015-05-09 MED ORDER — GUAIFENESIN-DM 100-10 MG/5ML PO SYRP: 5.0000 mL | ORAL_SOLUTION | ORAL | Status: DC | PRN

## 2015-05-09 MED ORDER — ONDANSETRON HCL 4 MG/2ML IJ SOLN
4.0000 mg | Freq: Once | INTRAMUSCULAR | Status: AC
  Administered 2015-05-09: 4 mg via INTRAVENOUS
  Filled 2015-05-09: qty 2

## 2015-05-09 MED ORDER — METHYLPREDNISOLONE SODIUM SUCC 125 MG IJ SOLR
60.0000 mg | INTRAMUSCULAR | Status: DC
  Administered 2015-05-09: 60 mg via INTRAVENOUS
  Filled 2015-05-09: qty 0.96
  Filled 2015-05-09: qty 2

## 2015-05-09 MED ORDER — KCL IN DEXTROSE-NACL 10-5-0.45 MEQ/L-%-% IV SOLN
INTRAVENOUS | Status: DC
  Administered 2015-05-09 – 2015-05-11 (×4): via INTRAVENOUS
  Filled 2015-05-09 (×6): qty 1000

## 2015-05-09 MED ORDER — METRONIDAZOLE IN NACL 5-0.79 MG/ML-% IV SOLN
500.0000 mg | Freq: Three times a day (TID) | INTRAVENOUS | Status: DC
  Administered 2015-05-09 – 2015-05-11 (×7): 500 mg via INTRAVENOUS
  Filled 2015-05-09 (×9): qty 100

## 2015-05-09 MED ORDER — ONDANSETRON HCL 4 MG/2ML IJ SOLN
4.0000 mg | Freq: Four times a day (QID) | INTRAMUSCULAR | Status: DC | PRN
  Administered 2015-05-09 – 2015-05-12 (×5): 4 mg via INTRAVENOUS
  Filled 2015-05-09 (×5): qty 2

## 2015-05-09 MED ORDER — MORPHINE SULFATE 2 MG/ML IJ SOLN
2.0000 mg | INTRAMUSCULAR | Status: DC | PRN
  Administered 2015-05-09 (×2): 2 mg via INTRAVENOUS
  Filled 2015-05-09 (×2): qty 1

## 2015-05-09 MED ORDER — SODIUM CHLORIDE 0.9 % IV BOLUS (SEPSIS)
1000.0000 mL | Freq: Once | INTRAVENOUS | Status: AC
  Administered 2015-05-09: 1000 mL via INTRAVENOUS

## 2015-05-09 MED ORDER — ONDANSETRON HCL 4 MG PO TABS: 4.0000 mg | ORAL_TABLET | Freq: Four times a day (QID) | ORAL | Status: DC | PRN

## 2015-05-09 NOTE — ED Provider Notes (Signed)
CSN: [id number]     Arrival date & time 05/09/15  1142 History   First MD Initiated Contact with Patient 05/09/15 1147     Chief Complaint  Patient presents with  . Crohn's Disease  . Abdominal Pain     (Consider location/radiation/quality/duration/timing/severity/associated sxs/prior Treatment) HPI Meghan Charles is a 44 year old female past medical history of Crohn's disease who presents the ER when you have nausea, vomiting, abdominal pain. Patient reports recent admission to the hospital and discharged on 05/05/15 for small bowel obstruction in context of Crohn's flareup. Patient reports over the past several days since being home she is experienced continued pain despite use of hydrocodone, or the past 24 hours has experienced intractable nausea and vomiting despite use of her home Phenergan. Patient reports a persistent right lower quadrant abdominal pain which has no aggravating or alleviating factors. She states movement makes it slightly worse. Patient reports anorexia associated. Patient denies any associated fever, denies diarrhea, constipation, hematochezia, melena, dysuria.  Past Medical History  Diagnosis Date  . Gallbladder polyp   . Anxiety   . Colon polyps 05/2014    TUBULAR ADENOMA AND HYPERPLASTIC POLYP.  . Crohn's disease   . Tubular adenoma of colon    Past Surgical History  Procedure Laterality Date  . Other surgical history      heel surgery  . Wisdom teeth extracted    . Right heel surgery      with plates and screws  . Colonoscopy w/ biopsies  2015   Family History  Problem Relation Age of Onset  . Colon cancer Maternal Grandmother 44  . Esophageal cancer Neg Hx   . Stomach cancer Neg Hx   . Rectal cancer Neg Hx   . Pancreatic cancer Neg Hx    History  Substance Use Topics  . Smoking status: Never Smoker   . Smokeless tobacco: Never Used  . Alcohol Use: 0.0 oz/week    0 Standard drinks or equivalent per week     Comment: 3 times per week   OB  History    No data available     Review of Systems  Constitutional: Negative for fever.  HENT: Negative for trouble swallowing.   Eyes: Negative for visual disturbance.  Respiratory: Negative for shortness of breath.   Cardiovascular: Negative for chest pain.  Gastrointestinal: Positive for nausea, vomiting and abdominal pain.  Genitourinary: Negative for dysuria.  Musculoskeletal: Negative for neck pain.  Skin: Negative for rash.  Neurological: Negative for dizziness, weakness and numbness.  Psychiatric/Behavioral: Negative.     Allergies  Review of patient's allergies indicates no known allergies.  Home Medications   Prior to Admission medications   Medication Sig Start Date End Date Taking? Authorizing Provider  Hydrocodone-Acetaminophen (VICODIN ES) 7.5-300 MG TABS Take 1-2 tablets by mouth every 6 hours as needed. Patient taking differently: Take 1-2 tablets by mouth every 6 (six) hours as needed (pain).  05/05/15  Yes Shanker Kristeen Mans, MD  hyoscyamine (LEVSIN SL) 0.125 MG SL tablet Place 1 tablet (0.125 mg total) under the tongue every 6 (six) hours as needed for cramping. 05/05/15  Yes Shanker Kristeen Mans, MD  promethazine (PHENERGAN) 12.5 MG tablet Take 1 tablet (12.5 mg total) by mouth every 6 (six) hours as needed for nausea or vomiting. 05/05/15  Yes Shanker Kristeen Mans, MD  vedolizumab (ENTYVIO) 300 MG injection Inject 300 mg into the vein every 8 (eight) weeks.   Yes Historical Provider, MD  azaTHIOprine (IMURAN) 50 MG tablet Take  1 tablet (50 mg total) by mouth daily. Patient not taking: Reported on 05/09/2015 01/21/15   Willia Craze, NP  fluticasone [facility]) 50 MCG/ACT nasal spray Place 2 sprays into both nostrils daily. Patient not taking: Reported on 05/09/2015 03/26/15   Brand Males, MD   BP 133/93 mmHg  Pulse 112  Temp(Src) 99.1 F (37.3 C)  Resp 22  SpO2 100% Physical Exam  Constitutional: She is oriented to person, place, and time. She appears  well-developed and well-nourished.  Sickly appearing female in moderate distress from pain.  HENT:  Head: Normocephalic and atraumatic.  Mouth/Throat: Oropharynx is clear and moist. No oropharyngeal exudate.  Eyes: Right eye exhibits no discharge. Left eye exhibits no discharge. No scleral icterus.  Neck: Normal range of motion.  Cardiovascular: Normal rate, regular rhythm and normal heart sounds.   No murmur heard. Pulmonary/Chest: Effort normal and breath sounds normal. No respiratory distress.  Abdominal: Soft. There is tenderness in the right lower quadrant.  Musculoskeletal: Normal range of motion. She exhibits no edema or tenderness.  Neurological: She is alert and oriented to person, place, and time. No cranial nerve deficit. Coordination normal.  Skin: Skin is warm and dry. No rash noted. She is not diaphoretic.  Psychiatric: She has a normal mood and affect.  Nursing note and vitals reviewed.   ED Course  Procedures (including critical care time) Labs Review Labs Reviewed  CBC WITH DIFFERENTIAL/PLATELET - Abnormal; Notable for the following:    Neutrophils Relative % 92 (*)    Neutro Abs 9.2 (*)    Lymphocytes Relative 6 (*)    Lymphs Abs 0.6 (*)    Monocytes Relative 2 (*)    All other components within normal limits  COMPREHENSIVE METABOLIC PANEL - Abnormal; Notable for the following:    Glucose, Bld 136 (*)    Total Protein 8.7 (*)    All other components within normal limits  URINALYSIS, ROUTINE W REFLEX MICROSCOPIC - Abnormal; Notable for the following:    Color, Urine AMBER (*)    APPearance CLOUDY (*)    Hgb urine dipstick LARGE (*)    Bilirubin Urine SMALL (*)    Ketones, ur >80 (*)    Protein, ur 30 (*)    Leukocytes, UA SMALL (*)    All other components within normal limits  URINE MICROSCOPIC-ADD ON - Abnormal; Notable for the following:    Squamous Epithelial / LPF FEW (*)    All other components within normal limits  LIPASE, BLOOD  PREGNANCY, URINE   I-STAT CG4 LACTIC ACID, ED    Imaging Review Dg Abd 2 Views  05/09/2015   CLINICAL DATA:  Right lower quadrant pain for 6 days.  EXAM: ABDOMEN - 2 VIEW  COMPARISON:  05/04/2015  FINDINGS: Scattered large and small bowel gas is noted. A few dilated loops of small bowel are again seen with air-fluid levels. This has increased slightly in the interval from the prior exam. Fecal material is noted throughout the colon. No free air is seen. No bony abnormality is noted.  IMPRESSION: Slight increase in the degree of small bowel dilatation likely related to partial small bowel obstruction as previously diagnosed.   Electronically Signed   By: Inez Catalina M.D.   On: 05/09/2015 15:23     EKG Interpretation None      MDM   Final diagnoses:  RLQ abdominal pain    Patient here with continued nausea, vomiting, right lower quadrant abdominal pain in light of recent  admission for small bowel obstruction. On review of patient's previous notes she was noted to have small bowel traction diagnosed by CT. During her admission she is counseled to by gastroenterology as well as by general surgery. Surgery recommended bowel resection due to failure of medical therapy of her SBO. Patient elected to not undergo the surgery until she can get her family in town and make sure her job was secured. Based on exam today it appears patient is only able to tolerate fluids at home, cannot tolerate solid foods. Patient with continuing pain, intractable vomiting and inability to tolerate by mouth as outpatient. Patient will require admission for further workup of her SBO. Spoke with Dr. Candiss Norse who agrees to admit patient to medicine, we'll consult surgery in regards patient's case.  Spoke with Dr. Lucia Gaskins from general surgery who agrees to consult in patient's case. Dr. Lucia Gaskins states he is familiar with patient's case. He agrees to admit to medicine, surgery will see.  The patient appears reasonably stabilized for admission  considering the current resources, flow, and capabilities available in the ED at this time, and I doubt any other [facility] requiring further screening and/or treatment in the ED prior to admission.  Signed,  Dahlia Bailiff, PA-C 4:33 PM   Dahlia Bailiff, PA-C 05/09/15 [place], MD 05/10/15 [phone]

## 2015-05-09 NOTE — ED Notes (Signed)
Pt recently d/c from hospital. Hx Crohns. Began vomiting again this am. Denies blood in emesis. C/o abd pain. Denies diarrhea. Has taken hydrocodone, phenergan and hyoscyamine at home without relief.

## 2015-05-09 NOTE — Consult Note (Signed)
Re:   Meghan Charles DOB:   [birth year] MRN:   [id number]   [facility] General Surgery consult  ASSESSMENT AND PLAN: 1.  Crohn's disease  Followed by Dr. Lenna Sciara. Hilarie Fredrickson.  She has been on Entyvio   2.  Bowel obstruction  Appears to have a stricture of her TI  Will probably come to surgery this admission  3.  Nephrolithiasis - 7 years ago.  Chief Complaint  Patient presents with  . Crohn's Disease  . Abdominal Pain   REFERRING PHYSICIAN: PROVIDER NOT IN SYSTEM  HISTORY OF PRESENT ILLNESS: Meghan Charles is a 44 y.o. (DOB: 10/09/1971)  white  female whose primary care physician is PROVIDER NOT IN SYSTEM . She was diagnosed with Crohns Disease in June 2015 by Dr. Hilarie Fredrickson.  She underwent a colonoscopy and capsule endoscopy.  She has been on steroids (stopped around Feb 2016) and on [place].  The patient was recently in the hospital (05/03/2015 - 05/15/2015) for a SBO secondary to a stricture.  There was discussion about surgery, but the patient wanted to go home first.  She was treated with Entyvio prior to discharge. Now she has [facility]ed to the [facility] with worsening abdominal pain, nausea and vomiting..    Past Medical History  Diagnosis Date  . Gallbladder polyp   . Anxiety   . Colon polyps 05/2014    TUBULAR ADENOMA AND HYPERPLASTIC POLYP.  . Crohn's disease   . Tubular adenoma of colon       Past Surgical History  Procedure Laterality Date  . Other surgical history      heel surgery  . Wisdom teeth extracted    . Right heel surgery      with plates and screws  . Colonoscopy w/ biopsies  2015      Current Facility-Administered Medications  Medication Dose Route Frequency Provider Last Rate Last Dose  . ciprofloxacin (CIPRO) IVPB 400 mg  400 mg Intravenous Q12H Thurnell Lose, MD      . methylPREDNISolone sodium succinate (SOLU-MEDROL) 125 mg/2 mL injection 60 mg  60 mg Intravenous Q24H Thurnell Lose, MD   60 mg at 05/09/15 1616  . metroNIDAZOLE (FLAGYL) IVPB 500 mg  500  mg Intravenous Q8H Thurnell Lose, MD 100 mL/hr at 05/09/15 1616 500 mg at 05/09/15 1616  . vedolizumab (ENTYVIO) 300 mg in sodium chloride 0.9 % 250 mL infusion  300 mg Intravenous Q8 Weeks Jerene Bears, MD       Current Outpatient Prescriptions  Medication Sig Dispense Refill  . Hydrocodone-Acetaminophen (VICODIN ES) 7.5-300 MG TABS Take 1-2 tablets by mouth every 6 hours as needed. (Patient taking differently: Take 1-2 tablets by mouth every 6 (six) hours as needed (pain). ) 30 each 0  . hyoscyamine (LEVSIN SL) 0.125 MG SL tablet Place 1 tablet (0.125 mg total) under the tongue every 6 (six) hours as needed for cramping. 30 tablet 0  . promethazine (PHENERGAN) 12.5 MG tablet Take 1 tablet (12.5 mg total) by mouth every 6 (six) hours as needed for nausea or vomiting. 30 tablet 0  . vedolizumab (ENTYVIO) 300 MG injection Inject 300 mg into the vein every 8 (eight) weeks.    Marland Kitchen azaTHIOprine (IMURAN) 50 MG tablet Take 1 tablet (50 mg total) by mouth daily. (Patient not taking: Reported on 05/09/2015) 30 tablet 2  . fluticasone (FLONASE) 50 MCG/ACT nasal spray Place 2 sprays into both nostrils daily. (Patient not taking: Reported on 05/09/2015) 16 g 2  No Known Allergies  REVIEW OF SYSTEMS: Skin:  No history of rash.  No history of abnormal moles. Infection:  No history of hepatitis or HIV.  No history of MRSA. Neurologic:  No history of stroke.  No history of seizure.  No history of headaches. Cardiac:  No history of hypertension. No history of heart disease.  No history of prior cardiac catheterization.  No history of seeing a cardiologist. Pulmonary:  Does not smoke cigarettes.  No asthma or bronchitis.  No OSA/CPAP.  Endocrine:  No diabetes. No thyroid disease. Gastrointestinal:  See HPI. Urologic:  History of nephrolithiasis. Musculoskeletal:  No history of joint or back disease. Hematologic:  No bleeding disorder.  No history of anemia.  Not anticoagulated. Psycho-social:  The  patient is oriented.   The patient has no obvious psychologic or social impairment to understanding our conversation and plan.  SOCIAL and FAMILY HISTORY: Single. Her parents live in [place], [place].  But they are in [place]. She expects them here tomorrow. No children. Works - Environmental consultant apartments and bartends for [employer]: BP 133/93 mmHg  Pulse 112  Temp(Src) 99.1 F (37.3 C)  Resp 22  SpO2 100%  General: WN WF who is alert.  HEENT: Normal. Pupils equal. Neck: Supple. No mass.  No thyroid mass. Lymph Nodes:  No supraclavicular or cervical nodes. Lungs: Clear to auscultation and symmetric breath sounds. Heart:  RRR. No murmur or rub.  Abdomen: Soft. No abdominal scars.  Sore right abdomen.  No peritoneal signs.  No mass. Rectal: Not done. Extremities:  Good strength and ROM  in upper and lower extremities.  Has alternated colored fingernails.  Tattoos both arms. Neurologic:  Grossly intact to motor and sensory function. Psychiatric: Has normal mood and affect. Behavior is normal.   DATA REVIEWED: Epic notes.  Alphonsa Overall, MD,  [facility], [address]  [place], [place]    [place] Phone:  [phone] FAX:  [phone]

## 2015-05-09 NOTE — H&P (Addendum)
Patient Demographics  Meghan Charles, is a 44 y.o. female  MRN: [id number]   DOB - Oct 21, 1971  Admit Date - 05/09/2015  Outpatient Primary MD for the patient is PROVIDER NOT IN SYSTEM   With History of -  Past Medical History  Diagnosis Date  . Gallbladder polyp   . Anxiety   . Colon polyps 05/2014    TUBULAR ADENOMA AND HYPERPLASTIC POLYP.  . Crohn's disease   . Tubular adenoma of colon       Past Surgical History  Procedure Laterality Date  . Other surgical history      heel surgery  . Wisdom teeth extracted    . Right heel surgery      with plates and screws  . Colonoscopy w/ biopsies  2015    in for   Chief Complaint  Patient presents with  . Crohn's Disease  . Abdominal Pain     HPI  Meghan Charles  is a 44 y.o. female, with history of Crohn's colitis diagnosed 1 year ago, colonic polyps, gallbladder polyp, whose been started on Crohn's treatment for the last 1 year and currently is on 2 different immunomodulators, was admitted for Crohn's colitis exacerbation with mostly stricture related symptoms of nausea vomiting and abdominal pain and was supposed to have small bowel resection in near future comes back with chief complaints of nausea vomiting and generalized abdominal pain ongoing for the last 2 days.  Patient was recommended to have surgical resection but wanted to hold off a couple of weeks due to some personal engagements, however she continued to do poorly and came to the ER with above related symptoms. She denies any fever or chills, no headache, no chest pain cough and shortness of breath, no palpitations. Her abdominal pain is epigastric and nonradiating, worse after eating food better with pain medications and bowel rest, it is associated with nausea and no emesis today, she is  passing flatness, she did have some diarrhea yesterday without any blood or mucus in stool. She denies any unintentional weight loss, no family history of Crohn's or colitis.    Review of Systems    In addition to the HPI above,  No Fever-chills, No Headache, No changes with Vision or hearing, No problems swallowing food or Liquids, No Chest pain, Cough or Shortness of Breath, +ve  Abdominal pain & Nausea , No Vommitting, Bowel movements are regular, No Blood in stool or Urine, No dysuria, No new skin rashes or bruises, No new joints pains-aches,  No new weakness, tingling, numbness in any extremity, No recent weight gain or loss, No polyuria, polydypsia or polyphagia, No significant Mental Stressors.  A full 10 point Review of Systems was done, except as stated above, all other Review of Systems were negative.   Social History History  Substance Use Topics  . Smoking status: Never Smoker   . Smokeless tobacco: Never Used  . Alcohol Use: 0.0  oz/week    0 Standard drinks or equivalent per week     Comment: 3 times per week       Family History Family History  Problem Relation Age of Onset  . Colon cancer Maternal Grandmother 28  . Esophageal cancer Neg Hx   . Stomach cancer Neg Hx   . Rectal cancer Neg Hx   . Pancreatic cancer Neg Hx       Prior to Admission medications   Medication Sig Start Date End Date Taking? Authorizing Provider  Hydrocodone-Acetaminophen (VICODIN ES) 7.5-300 MG TABS Take 1-2 tablets by mouth every 6 hours as needed. Patient taking differently: Take 1-2 tablets by mouth every 6 (six) hours as needed (pain).  05/05/15  Yes Shanker Kristeen Mans, MD  hyoscyamine (LEVSIN SL) 0.125 MG SL tablet Place 1 tablet (0.125 mg total) under the tongue every 6 (six) hours as needed for cramping. 05/05/15  Yes Shanker Kristeen Mans, MD  promethazine (PHENERGAN) 12.5 MG tablet Take 1 tablet (12.5 mg total) by mouth every 6 (six) hours as needed for nausea or vomiting.  05/05/15  Yes Shanker Kristeen Mans, MD  vedolizumab (ENTYVIO) 300 MG injection Inject 300 mg into the vein every 8 (eight) weeks.   Yes Historical Provider, MD  azaTHIOprine (IMURAN) 50 MG tablet Take 1 tablet (50 mg total) by mouth daily. Patient not taking: Reported on 05/09/2015 01/21/15   Willia Craze, NP  fluticasone [facility]) 50 MCG/ACT nasal spray Place 2 sprays into both nostrils daily. Patient not taking: Reported on 05/09/2015 03/26/15   Brand Males, MD    No Known Allergies  Physical Exam  Vitals  Blood pressure 133/93, pulse 112, temperature 99.1 F (37.3 C), resp. rate 22, SpO2 100 %.   1. General middle aged white female lying in bed in NAD,     2. Normal affect and insight, Not Suicidal or Homicidal, Awake Alert, Oriented X 3.  3. No F.N deficits, ALL C.Nerves Intact, Strength 5/5 all 4 extremities, Sensation intact all 4 extremities, Plantars down going.  4. Ears and Eyes appear Normal, Conjunctivae clear, PERRLA. Moist Oral Mucosa.  5. Supple Neck, No JVD, No cervical lymphadenopathy appriciated, No Carotid Bruits.  6. Symmetrical Chest wall movement, Good air movement bilaterally, CTAB.  7. RRR, No Gallops, Rubs or Murmurs, No Parasternal Heave.  8. Positive Bowel Sounds, Abdomen Soft, mild epigastric tenderness, No organomegaly appriciated,No rebound -guarding or rigidity.  9.  No Cyanosis, Normal Skin Turgor, No Skin Rash or Bruise.  10. Good muscle tone,  joints appear normal , no effusions, Normal ROM.  11. No Palpable Lymph Nodes in Neck or Axillae     Data Review  CBC  Recent Labs Lab 05/03/15 0510 05/04/15 0618 05/09/15 1220  WBC 15.6* 4.1 10.0  HGB 14.0 11.9* 14.5  HCT 42.9 35.6* 43.1  PLT 358 292 353  MCV 90.5 90.6 89.6  MCH 29.5 30.3 30.1  MCHC 32.6 33.4 33.6  RDW 12.7 12.8 12.4  LYMPHSABS 1.1  --  0.6*  MONOABS 0.6  --  0.2  EOSABS 0.1  --  0.0  BASOSABS 0.0  --  0.0    ------------------------------------------------------------------------------------------------------------------  Chemistries   Recent Labs Lab 05/03/15 0510 05/04/15 0618 05/09/15 1220  NA 138 139 140  K 3.7 3.4* 3.9  CL 107 109 102  CO2 21* 22 25  GLUCOSE 142* 97 136*  BUN 14 6 11   CREATININE 0.82 0.65 0.79  CALCIUM 9.3 8.3* 10.3  AST 28 18  20  ALT 21 18 20   ALKPHOS 39 28* 41  BILITOT 0.8 0.4 1.1   ------------------------------------------------------------------------------------------------------------------ estimated creatinine clearance is 91.5 mL/min (by C-G formula based on Cr of 0.79). ------------------------------------------------------------------------------------------------------------------ No results for input(s): TSH, T4TOTAL, T3FREE, THYROIDAB in the last 72 hours.  Invalid input(s): FREET3   Coagulation profile No results for input(s): INR, PROTIME in the last 168 hours. ------------------------------------------------------------------------------------------------------------------- No results for input(s): DDIMER in the last 72 hours. -------------------------------------------------------------------------------------------------------------------  Cardiac Enzymes No results for input(s): CKMB, TROPONINI, MYOGLOBIN in the last 168 hours.  Invalid input(s): CK ------------------------------------------------------------------------------------------------------------------ Invalid input(s): POCBNP   ---------------------------------------------------------------------------------------------------------------  Urinalysis    Component Value Date/Time   COLORURINE AMBER* 05/09/2015 1355   APPEARANCEUR CLOUDY* 05/09/2015 1355   LABSPEC 1.026 05/09/2015 1355   PHURINE 6.0 05/09/2015 1355   GLUCOSEU NEGATIVE 05/09/2015 1355   HGBUR LARGE* 05/09/2015 1355   BILIRUBINUR SMALL* 05/09/2015 1355   KETONESUR >80* 05/09/2015 1355   PROTEINUR  30* 05/09/2015 1355   UROBILINOGEN 1.0 05/09/2015 1355   NITRITE NEGATIVE 05/09/2015 1355   LEUKOCYTESUR SMALL* 05/09/2015 1355    ----------------------------------------------------------------------------------------------------------------  Imaging results:   Dg Abd 2 Views  05/09/2015   CLINICAL DATA:  Right lower quadrant pain for 6 days.  EXAM: ABDOMEN - 2 VIEW  COMPARISON:  05/04/2015  FINDINGS: Scattered large and small bowel gas is noted. A few dilated loops of small bowel are again seen with air-fluid levels. This has increased slightly in the interval from the prior exam. Fecal material is noted throughout the colon. No free air is seen. No bony abnormality is noted.  IMPRESSION: Slight increase in the degree of small bowel dilatation likely related to partial small bowel obstruction as previously diagnosed.   Electronically Signed   By: Inez Catalina M.D.   On: 05/09/2015 15:23        Assessment & Plan   1. Crohn's colitis exacerbation with small bowel stricture related symptoms. Was due for small bowel resection in the coming future, will admit to med surge bed, bowel rest, IV fluids, IV Solu Medrol 60 daily, IV Cipro Flagyl for now. Hold oral medications and immunomodulators currently. Have consulted general surgery and GI. Supportive care with pain and nausea medications.   2. Colonic polyps and gallbladder polyp. Defer to primary GI which is Dr. Hilarie Fredrickson and general surgery.    DVT Prophylaxis   Lovenox   AM Labs Ordered, also please review Full Orders  Family Communication: Admission, patients condition and plan of care including tests being ordered have been discussed with the patient who indicates understanding and agree with the plan and Code Status.  Code Status Full  Likely DC to  Home  Condition Fair  Time spent in minutes : 35    Cherese Lozano K M.D on 05/09/2015 at 4:29 PM  Between 7am to 7pm - Pager - [phone]  After 7pm go to [email or web address] -  password [facility]  [facility]  Office  [phone]

## 2015-05-10 DIAGNOSIS — K5669 Other intestinal obstruction: Secondary | ICD-10-CM

## 2015-05-10 DIAGNOSIS — K50912 Crohn's disease, unspecified, with intestinal obstruction: Secondary | ICD-10-CM

## 2015-05-10 LAB — CBC
HEMATOCRIT: 33.9 % — AB (ref 36.0–46.0)
Hemoglobin: 11.4 g/dL — ABNORMAL LOW (ref 12.0–15.0)
MCH: 30.2 pg (ref 26.0–34.0)
MCHC: 33.6 g/dL (ref 30.0–36.0)
MCV: 89.9 fL (ref 78.0–100.0)
Platelets: 308 10*3/uL (ref 150–400)
RBC: 3.77 MIL/uL — ABNORMAL LOW (ref 3.87–5.11)
RDW: 12.5 % (ref 11.5–15.5)
WBC: 5.8 10*3/uL (ref 4.0–10.5)

## 2015-05-10 LAB — BASIC METABOLIC PANEL
Anion gap: 9 (ref 5–15)
BUN: 8 mg/dL (ref 6–20)
CO2: 20 mmol/L — ABNORMAL LOW (ref 22–32)
CREATININE: 0.58 mg/dL (ref 0.44–1.00)
Calcium: 8.6 mg/dL — ABNORMAL LOW (ref 8.9–10.3)
Chloride: 107 mmol/L (ref 101–111)
GFR calc Af Amer: >60 mL/min (ref >60)
GLUCOSE: 105 mg/dL — AB (ref 65–99)
POTASSIUM: 3.8 mmol/L (ref 3.5–5.1)
Sodium: 136 mmol/L (ref 135–145)

## 2015-05-10 LAB — SURGICAL PCR SCREEN
MRSA, PCR: NEGATIVE
Staphylococcus aureus: NEGATIVE

## 2015-05-10 MED ORDER — CHLORHEXIDINE GLUCONATE 4 % EX LIQD
1.0000 "application " | Freq: Once | CUTANEOUS | Status: AC
  Administered 2015-05-11: 1 via TOPICAL
  Filled 2015-05-10 (×2): qty 15

## 2015-05-10 MED ORDER — ZOLPIDEM TARTRATE 5 MG PO TABS
5.0000 mg | ORAL_TABLET | Freq: Every evening | ORAL | Status: DC | PRN
  Administered 2015-05-10 – 2015-05-17 (×4): 5 mg via ORAL
  Filled 2015-05-10 (×4): qty 1

## 2015-05-10 MED ORDER — HYDROMORPHONE HCL 1 MG/ML IJ SOLN
1.0000 mg | INTRAMUSCULAR | Status: DC | PRN
  Administered 2015-05-10 – 2015-05-11 (×6): 1 mg via INTRAVENOUS
  Filled 2015-05-10 (×7): qty 1

## 2015-05-10 NOTE — Progress Notes (Signed)
Subjective: Patient is feeling better. Pain is less. Passing flatus. I have reviewed her colonoscopy report, capsule endoscopy report, and CT scan. I have discussed her care with Dr. Excell Seltzer and Dr. Lucia Gaskins. She would like to proceed with resection of her Crohn's disease this week. I told her we would shoot for tomorrow. Her parents are coming in from out of town today.  She has not taken any steroids since January. I discussed this with Dr. Candiss Norse, and we decided to discontinue the IV steroids..  Dr. Candiss Norse states that he has asked Dr. Hilarie Fredrickson to see the patient today. I think this will be helpful to have his perspective on the patient's history and future need for immunosuppression.  Objective: Vital signs in last 24 hours: Temp:  [97.5 F (36.4 C)-99.1 F (37.3 C)] 97.6 F (36.4 C) (05/23 0505) Pulse Rate:  [97-120] 97 (05/23 0505) Resp:  [18-22] 18 (05/23 0505) BP: (122-154)/(85-99) 122/85 mmHg (05/23 0505) SpO2:  [91 %-100 %] 99 % (05/23 0505) Weight:  [64.9 kg (143 lb 1.3 oz)-65.3 kg (143 lb 15.4 oz)] 65.3 kg (143 lb 15.4 oz) (05/23 0505) Last BM Date: 05/08/15  Intake/Output from previous day:   Intake/Output this shift:      EXAM: General appearance: Alert. Oriented. Cooperative. Good insight. Appears healthy. Resp: clear to auscultation bilaterally GI: Soft. No abdominal scars. No mass. Subjectively tender in the right lower quadrant but no real guarding  Lab Results:   Recent Labs  06/02/2015 1811 05/10/15 0440  WBC 8.1 5.8  HGB 13.4 11.4*  HCT 40.7 33.9*  PLT 354 308   BMET  Recent Labs  06-02-15 1220 06/02/15 1811 05/10/15 0440  NA 140  --  136  K 3.9  --  3.8  CL 102  --  107  CO2 25  --  20*  GLUCOSE 136*  --  105*  BUN 11  --  8  CREATININE 0.79 0.66 0.58  CALCIUM 10.3  --  8.6*   PT/INR  Recent Labs  06-02-2015 1811  LABPROT 14.3  INR 1.09   ABG No results for input(s): PHART, HCO3 in the last 72 hours.  Invalid input(s): PCO2,  PO2  Studies/Results: Dg Abd 2 Views  2015-06-02   CLINICAL DATA:  Right lower quadrant pain for 6 days.  EXAM: ABDOMEN - 2 VIEW  COMPARISON:  05/04/2015  FINDINGS: Scattered large and small bowel gas is noted. A few dilated loops of small bowel are again seen with air-fluid levels. This has increased slightly in the interval from the prior exam. Fecal material is noted throughout the colon. No free air is seen. No bony abnormality is noted.  IMPRESSION: Slight increase in the degree of small bowel dilatation likely related to partial small bowel obstruction as previously diagnosed.   Electronically Signed   By: Meghan Charles M.D.   On: 2015-06-02 15:23    Anti-infectives: Anti-infectives    Start     Dose/Rate Route Frequency Ordered Stop   June 02, 2015 1615  ciprofloxacin (CIPRO) IVPB 400 mg     400 mg 200 mL/hr over 60 Minutes Intravenous Every 12 hours 06-02-15 1612     June 02, 2015 1615  metroNIDAZOLE (FLAGYL) IVPB 500 mg     500 mg 100 mL/hr over 60 Minutes Intravenous Every 8 hours 2015-06-02 1612        Assessment/Plan:     LOS: 1 day   Crohn's disease with stricture of near terminal ileum causing partial SBO. We will percent plan to  proceed with intestinal resection tomorrow. This will likely involve resection of distal ileum and likely ileocecal valve and cecum and appendix. I discussed this with her. I discussed the indications, details, techniques, and numerous risk of this surgery with her. She's aware of the risk of bleeding, infection, anastomotic leak with reoperation and ostomy, wound healing problems such as hernia, injury to adjacent organs such as vascular structures or ureter with major reconstructive surgery. She understands all of these issues. All of her questions are answered. She agrees with this plan. She asked that we hold off until tomorrow since her parents are driving in from out of town.  Continue Cipro and Flagyl Discontinue steroids NPO  Dr. Hilarie Fredrickson to see today  regarding history and long-term management issues.  Meghan Charles 05/10/2015

## 2015-05-10 NOTE — Progress Notes (Signed)
Initial Nutrition Assessment  DOCUMENTATION CODES:  Not applicable  INTERVENTION:  Diet advancement per MD When diet is advanced, recommend [employer]reeze po TID, each supplement provides 250 kcal and 9 grams of protein RD to continue to monitor  NUTRITION DIAGNOSIS:  Inadequate oral intake related to inability to eat as evidenced by NPO status.  GOAL:  Patient will meet greater than or equal to 90% of their needs  MONITOR:  Diet advancement, Labs, Weight trends, Skin, I & O's  REASON FOR ASSESSMENT:  Malnutrition Screening Tool    ASSESSMENT: 43 y.o. female, with history of Crohn's colitis diagnosed 1 year ago, colonic polyps, gallbladder polyp, whose been started on Crohn's treatment for the last 1 year, was admitted for Crohn's colitis exacerbation with mostly stricture related symptoms of nausea vomiting and abdominal pain and was supposed to have small bowel resection in near future comes back with chief complaints of nausea vomiting and generalized abdominal pain ongoing for the last 2 days.  Pt reports not having any solid foods since 5/14, pt was able to have liquids mainly. Pt states she felt too bloated to eat much. Pt with recent weight gain. Pt states her UBW is around 140 lb.   Pt is scheduled for surgery tomorrow. Pt is NPO until then. Pt is interested in nutritional supplements once diet is advanced.  Nutrition focused physical exam shows no sign of depletion of muscle mass or body fat.  Labs reviewed.  Height:  Ht Readings from Last 1 Encounters:  05/09/15 5\' 8"  (1.727 m)    Weight:  Wt Readings from Last 1 Encounters:  05/10/15 143 lb 15.4 oz (65.3 kg)    Ideal Body Weight:  63.6 kg  Wt Readings from Last 10 Encounters:  05/10/15 143 lb 15.4 oz (65.3 kg)  05/03/15 142 lb (64.411 kg)  04/20/15 144 lb 3.2 oz (65.409 kg)  03/26/15 143 lb (64.864 kg)  03/09/15 135 lb (61.236 kg)  02/18/15 135 lb (61.236 kg)  02/09/15 139 lb (63.05 kg)   01/18/15 138 lb (62.596 kg)  01/15/15 135 lb 6.4 oz (61.417 kg)  12/31/14 136 lb 6 oz (61.859 kg)    BMI:  Body mass index is 21.89 kg/(m^2).  Estimated Nutritional Needs:  Kcal:  1600-1800  Protein:  75-85g  Fluid:  1.6L/day     Skin:  Reviewed, no issues  Diet Order:  Diet NPO time specified Except for: Sips with Meds Diet NPO time specified  EDUCATION NEEDS:  No education needs identified at this time  No intake or output data in the 24 hours ending 05/10/15 1447  Last BM:  5/21  Clayton Bibles, MS, RD, LDN Pager: [phone] After Hours Pager: [phone]

## 2015-05-10 NOTE — Consult Note (Signed)
Consultation  Referring Provider:  [facility]    Primary Care Physician:  PROVIDER NOT IN SYSTEM Primary Gastroenterologist:  Zenovia Jarred, MD       Reason for Consultation:    Crohn's ileitis          HPI:   Meghan Charles is a 44 y.o. female with Crohn's enteritis diagnosed June 2015. After starting biologics in last Fall patient developed bilateral PNA with parapneumonic effusions requiring thoracentesis. Following that she flared on budesonide requiring hospitalization for partial small bowel obstruction. She was started on Prednisone and initially responded but then flared again requiring another admission in early Feb. Azathiopurine was started during that admission.   Patient started on Entyvio in February. She was seen in office early May after completion of 3 induction doses and completion of prednisone taper. At that visit she complained on RLQ pain. She was given a course of cipro and flagyl for possible diverticulitis. Patient subsequently ended up hospitalized with another proximal SBO. Surgery evaluated, recommended resection but patient was reluctant. She gotten her 4th dose of Entyvio while hospitalized last Tuesday. She was discharged 5/17 but returned yesterday with nausea, vomiting and abdominal pain. Surgery has seen again, patient now agreeable to resection. She continues to have RLQ pain. No nausea today. Not passing flatus.  Past Medical History  Diagnosis Date  . Gallbladder polyp   . Anxiety   . Colon polyps 05/2014    TUBULAR ADENOMA AND HYPERPLASTIC POLYP.  . Crohn's disease   . Tubular adenoma of colon     Past Surgical History  Procedure Laterality Date  . Other surgical history      heel surgery  . Wisdom teeth extracted    . Right heel surgery      with plates and screws  . Colonoscopy w/ biopsies  2015    Family History  Problem Relation Age of Onset  . Colon cancer Maternal Grandmother 48  . Esophageal cancer Neg Hx   . Stomach cancer  Neg Hx   . Rectal cancer Neg Hx   . Pancreatic cancer Neg Hx      History  Substance Use Topics  . Smoking status: Never Smoker   . Smokeless tobacco: Never Used  . Alcohol Use: 0.0 oz/week    0 Standard drinks or equivalent per week     Comment: 3 times per week    Prior to Admission medications   Medication Sig Start Date End Date Taking? Authorizing Provider  Hydrocodone-Acetaminophen (VICODIN ES) 7.5-300 MG TABS Take 1-2 tablets by mouth every 6 hours as needed. Patient taking differently: Take 1-2 tablets by mouth every 6 (six) hours as needed (pain).  05/05/15  Yes Shanker Kristeen Mans, MD  hyoscyamine (LEVSIN SL) 0.125 MG SL tablet Place 1 tablet (0.125 mg total) under the tongue every 6 (six) hours as needed for cramping. 05/05/15  Yes Shanker Kristeen Mans, MD  promethazine (PHENERGAN) 12.5 MG tablet Take 1 tablet (12.5 mg total) by mouth every 6 (six) hours as needed for nausea or vomiting. 05/05/15  Yes Shanker Kristeen Mans, MD  vedolizumab (ENTYVIO) 300 MG injection Inject 300 mg into the vein every 8 (eight) weeks.   Yes Historical Provider, MD  azaTHIOprine (IMURAN) 50 MG tablet Take 1 tablet (50 mg total) by mouth daily. Patient not taking: Reported on 05/09/2015 01/21/15   Willia Craze, NP  fluticasone [facility] 50 MCG/ACT nasal spray Place 2 sprays into both nostrils daily. Patient not taking: Reported  on 05/09/2015 03/26/15   Brand Males, MD    Current Facility-Administered Medications  Medication Dose Route Frequency Provider Last Rate Last Dose  . [START ON 05/11/2015] chlorhexidine (HIBICLENS) 4 % liquid 1 application  1 application Topical Once Fanny Skates, MD      . ciprofloxacin (CIPRO) IVPB 400 mg  400 mg Intravenous Q12H Thurnell Lose, MD   400 mg at 05/10/15 0503  . dextrose 5 % and 0.45 % NaCl with KCl 10 mEq/L infusion   Intravenous Continuous Fanny Skates, MD 125 mL/hr at 05/10/15 [phone]    . enoxaparin (LOVENOX) injection 40 mg  40 mg Subcutaneous Q24H  Thurnell Lose, MD   40 mg at 05/09/15 1944  . guaiFENesin-dextromethorphan (ROBITUSSIN DM) 100-10 MG/5ML syrup 5 mL  5 mL Oral Q4H PRN Thurnell Lose, MD      . HYDROcodone-acetaminophen (NORCO/VICODIN) 5-325 MG per tablet 1-2 tablet  1-2 tablet Oral Q4H PRN Thurnell Lose, MD      . HYDROmorphone (DILAUDID) injection 1 mg  1 mg Intravenous Q3H PRN Thurnell Lose, MD   1 mg at 05/10/15 0932  . metroNIDAZOLE (FLAGYL) IVPB 500 mg  500 mg Intravenous Q8H Thurnell Lose, MD 100 mL/hr at 05/10/15 0817 500 mg at 05/10/15 0817  . ondansetron (ZOFRAN) tablet 4 mg  4 mg Oral Q6H PRN Thurnell Lose, MD       Or  . ondansetron (ZOFRAN) injection 4 mg  4 mg Intravenous Q6H PRN Thurnell Lose, MD   4 mg at 05/10/15 0932    Allergies as of 05/09/2015  . (No Known Allergies)    Review of Systems:    All systems reviewed and negative except where noted in HPI.   Physical Exam:  Vital signs in last 24 hours: Temp:  [97.5 F (36.4 C)-99.1 F (37.3 C)] 97.6 F (36.4 C) (05/23 0505) Pulse Rate:  [97-120] 97 (05/23 0505) Resp:  [18-22] 18 (05/23 0505) BP: (122-154)/(85-99) 122/85 mmHg (05/23 0505) SpO2:  [91 %-100 %] 99 % (05/23 0505) Weight:  [143 lb 1.3 oz (64.9 kg)-143 lb 15.4 oz (65.3 kg)] 143 lb 15.4 oz (65.3 kg) (05/23 0505) Last BM Date: 05/08/15 General:   Pleasant white female in NAD Head:  Normocephalic and atraumatic. Eyes:   No icterus.   Conjunctiva pink. Ears:  Normal auditory acuity. Neck:  Supple; no masses felt Lungs:  Respirations even and unlabored. Lungs clear to auscultation bilaterally.   No wheezes, crackles, or rhonchi.  Heart:  Regular rate and rhythm;  murmur heard. Abdomen:  Soft, nondistended, mild RLQ tenderness. A few bowel sounds. No appreciable masses or hepatomegaly.  Rectal:  Not performed.  Msk:  Symmetrical without gross deformities.  Extremities:  Without edema. Neurologic:  Alert and  oriented x4;  grossly normal neurologically. Skin:  Intact  without significant lesions or rashes. Cervical Nodes:  No significant cervical adenopathy. Psych:  Alert and cooperative. Normal affect.  LAB RESULTS:  Recent Labs  05/09/15 1220 05/09/15 1811 05/10/15 0440  WBC 10.0 8.1 5.8  HGB 14.5 13.4 11.4*  HCT 43.1 40.7 33.9*  PLT 353 354 308   BMET  Recent Labs  05/09/15 1220 05/09/15 1811 05/10/15 0440  NA 140  --  136  K 3.9  --  3.8  CL 102  --  107  CO2 25  --  20*  GLUCOSE 136*  --  105*  BUN 11  --  8  CREATININE 0.79 0.66 0.58  CALCIUM 10.3  --  8.6*   LFT  Recent Labs  05/09/15 1220  PROT 8.7*  ALBUMIN 4.8  AST 20  ALT 20  ALKPHOS 41  BILITOT 1.1   PT/INR  Recent Labs  05/09/15 1811  LABPROT 14.3  INR 1.09    STUDIES: Dg Abd 2 Views  05/09/2015   CLINICAL DATA:  Right lower quadrant pain for 6 days.  EXAM: ABDOMEN - 2 VIEW  COMPARISON:  05/04/2015  FINDINGS: Scattered large and small bowel gas is noted. A few dilated loops of small bowel are again seen with air-fluid levels. This has increased slightly in the interval from the prior exam. Fecal material is noted throughout the colon. No free air is seen. No bony abnormality is noted.  IMPRESSION: Slight increase in the degree of small bowel dilatation likely related to partial small bowel obstruction as previously diagnosed.   Electronically Signed   By: Inez Catalina M.D.   On: 05/09/2015 15:23            Impression / Plan:    44 year old female with Crohn's ileitis refractory to steroids, immunomodulator and biologics. She has a long segment of stricturing in distal small bowel on imaging. Patient has been reluctant to have surgery but now feels she has given medical therapy her best shot. Surgery planned for tomorrow. She has been off Azathioprine for several days but last dose of Entyvio was only a week ago, hopefully this won't complicate healing.   Thanks   LOS: 1 day   Tye Savoy  05/10/2015, 10:27 AM

## 2015-05-10 NOTE — Anesthesia Preprocedure Evaluation (Addendum)
Anesthesia Evaluation  Patient identified by MRN, date of birth, ID band Patient awake    Reviewed: Allergy & Precautions, H&P , NPO status , Patient's Chart, lab work & pertinent test results, reviewed documented beta blocker date and time   Airway Mallampati: II  TM Distance: >3 FB Neck ROM: full    Dental no notable dental hx.    Pulmonary neg pulmonary ROS, pneumonia -, resolved,  breath sounds clear to auscultation  Pulmonary exam normal       Cardiovascular Exercise Tolerance: Good hypertension, Pt. on medications Normal cardiovascular examRhythm:regular Rate:Normal  tachycardia   Neuro/Psych Anxiety negative neurological ROS  negative psych ROS   GI/Hepatic negative GI ROS, Neg liver ROS, GERD-  Medicated,CHRON'S   Endo/Other  negative endocrine ROS  Renal/GU negative Renal ROS  negative genitourinary   Musculoskeletal   Abdominal Normal abdominal exam  (+)   Peds  Hematology negative hematology ROS (+) anemia , 11/34   Anesthesia Other Findings   Reproductive/Obstetrics negative OB ROS HCG neg 05/09/2015                             Anesthesia Physical Anesthesia Plan  ASA: II  Anesthesia Plan: General   Post-op Pain Management:    Induction: Intravenous  Airway Management Planned: Oral ETT  Additional Equipment:   Intra-op Plan:   Post-operative Plan: Extubation in OR  Informed Consent: I have reviewed the patients History and Physical, chart, labs and discussed the procedure including the risks, benefits and alternatives for the proposed anesthesia with the patient or authorized representative who has indicated his/her understanding and acceptance.   Dental Advisory Given  Plan Discussed with: CRNA and Surgeon  Anesthesia Plan Comments:         Anesthesia Quick Evaluation

## 2015-05-10 NOTE — Progress Notes (Signed)
Patient Demographics  Meghan Charles, is a 44 y.o. female, DOB - 09-06-1971, EHM:[id number]  Admit date - 05/09/2015   Admitting Physician Thurnell Lose, MD  Outpatient Primary MD for the patient is [facility] NOT [facility]  LOS - 1   Chief Complaint  Patient presents with  . Crohn's Disease  . Abdominal Pain        Subjective:   Meghan Charles today has, No headache, No chest pain, mild upper abdominal pain - No Nausea, No new weakness tingling or numbness, No Cough - SOB.    Assessment & Plan    1. Crohn's colitis exacerbation with small bowel stricture related symptoms. Was due for small bowel resection in the coming future, will admit to med surge bed, bowel rest, IV fluids, IV Cipro Flagyl for now,  holding off further Solu-Medrol as she might require surgery and it might hinder prompt healing, Hold oral medications and immunomodulators currently. Have consulted general surgery and GI. Supportive care with pain and nausea medications. will likely require a Small bowel resection this admission.    2. Colonic polyps and gallbladder polyp. Defer to primary GI which is Dr. Hilarie Fredrickson and general surgery.     Code Status: Full  Family Communication: None present  Disposition Plan: Likely home in 3-4 days after surgery   Consults  GI Dr. Hilarie Fredrickson and general surgery   Procedures likely will require ileal resections this admission   DVT Prophylaxis  Lovenox    Lab Results  Component Value Date   PLT 308 05/10/2015    Medications  Scheduled Meds: . [START ON 05/11/2015] chlorhexidine  1 application Topical Once  . ciprofloxacin  400 mg Intravenous Q12H  . enoxaparin (LOVENOX) injection  40 mg Subcutaneous Q24H  . metronidazole  500 mg Intravenous Q8H   Continuous Infusions: .  dextrose 5 % and 0.45 % NaCl with KCl 10 mEq/L 125 mL/hr at 05/10/15 0811   PRN Meds:.guaiFENesin-dextromethorphan, HYDROcodone-acetaminophen, HYDROmorphone (DILAUDID) injection, ondansetron **OR** ondansetron (ZOFRAN) IV  Antibiotics     Anti-infectives    Start     Dose/Rate Route Frequency Ordered Stop   05/09/15 1615  ciprofloxacin (CIPRO) IVPB 400 mg     400 mg 200 mL/hr over 60 Minutes Intravenous Every 12 hours 05/09/15 1612     05/09/15 1615  metroNIDAZOLE (FLAGYL) IVPB 500 mg     500 mg 100 mL/hr over 60 Minutes Intravenous Every 8 hours 05/09/15 1612          Objective:   Filed Vitals:   05/09/15 1152 05/09/15 1730 05/09/15 2054 05/10/15 0505  BP:  154/99 145/92 122/85  Pulse: 112 109 111 97  Temp:  97.5 F (36.4 C) 98.3 F (36.8 C) 97.6 F (36.4 C)  TempSrc:  Oral Oral Oral  Resp:  18 18 18   Height:  5' 8"  (1.727 m)    Weight:  64.9 kg (143 lb 1.3 oz)  65.3 kg (143 lb 15.4 oz)  SpO2: 100% 97% 91% 99%    Wt Readings from Last 3 Encounters:  05/10/15 65.3 kg (143 lb 15.4 oz)  05/03/15 64.411 kg (142 lb)  04/20/15 65.409 kg (144 lb 3.2 oz)    No intake or output data in the 24 hours  ending 05/10/15 1142   Physical Exam  Awake Alert, Oriented X 3, No new F.N deficits, Normal affect [place].AT,PERRAL Supple Neck,No JVD, No cervical lymphadenopathy appriciated.  Symmetrical Chest wall movement, Good air movement bilaterally, CTAB RRR,No Gallops,Rubs or new Murmurs, No Parasternal Heave +ve B.Sounds, Abd Soft, mild upper abdominal tenderness, No organomegaly appriciated, No rebound - guarding or rigidity. No Cyanosis, Clubbing or edema, No new Rash or bruise      Data Review   Micro Results Recent Results (from the past 240 hour(s))  Clostridium Difficile by PCR     Status: None   Collection Time: 05/03/15  8:07 AM  Result Value Ref Range Status   C difficile by pcr NEGATIVE NEGATIVE Final  Surgical pcr screen     Status: None   Collection Time: 05/10/15   9:40 AM  Result Value Ref Range Status   MRSA, PCR NEGATIVE NEGATIVE Final   Staphylococcus aureus NEGATIVE NEGATIVE Final    Comment:        The Xpert SA Assay (FDA approved for NASAL specimens in patients over 60 years of age), is one component of a comprehensive surveillance program.  Test performance has been validated by [facility] for patients greater than or equal to 64 year old. It is not intended to diagnose infection nor to guide or monitor treatment.     Radiology Reports Ct Abdomen Pelvis W Contrast  05/03/2015   CLINICAL DATA:  44 year old female with a history of right lower quadrant pain for several days.  EXAM: CT ABDOMEN AND PELVIS WITH CONTRAST  TECHNIQUE: Multidetector CT imaging of the abdomen and pelvis was performed using the standard protocol following bolus administration of intravenous contrast.  CONTRAST:  178m OMNIPAQUE IOHEXOL 300 MG/ML  SOLN  COMPARISON:  CT abdomen 12/26/2014, chest CT 12/21/2014, CT abdomen 09/22/2014  FINDINGS: Chest:  Surgical changes of prior breast augmentation.  Asymmetric appearance of the thoracic cage again evident.  Heart size within normal limits.  No pericardial fluid/ thickening.  No hiatal hernia.  No confluent airspace disease or pneumothorax.  Abdomen/ pelvis:  Unremarkable liver.  Unremarkable spleen.  Left adrenal gland a small. Nodule involving the medial limb of the right adrenal gland. 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  Unremarkable appearance of the bilateral kidneys.  Unremarkable appearance of the pancreas.  Unremarkable gallbladder.  No intra abdominal air. Small amount of free fluid layered within the pelvis, with low Hounsfield units.  Enteric contrast within stomach and small bowel, with contrast traversing the ileocecal valve. The colon is relatively decompressed with no adjacent inflammatory changes.  Borderline dilated small bowel loops within the mid and distal small bowel. There is a  transition from dilated small bowel to decompressed small bowel just proximal to the distal ileum and ileocecal valve (image 69 of series 2). This segment of small bowel is identical to the location of previously identified narrowing on the CT 12/26/2014. Contrast does traverse this segment.  The enteric contrast present on the current study decreases the sensitivity for detection of hyper enhancement of the mucosa an bowel wall. No associated inflammation within the mesenteric. No significantly increased blood flow within the mesenteric vessels (negative "comb sign").  Normal appendix.  Unremarkable appearance of the urinary bladder. Unremarkable appearance of the adnexa.  No evidence of acute bony abnormality. No significant degenerative changes. No sclerotic changes of the iliosacral joints. Unremarkable appearance of the proximal femurs.  IMPRESSION: Partial small bowel obstruction of the distal ileum with  borderline proximal dilated small bowel loops. The transition point is located at the site of prior small bowel inflammation identified on CT 12/26/2014, compatible with developing stricture. The presence of enteric contrast on the current CT decreases the sensitivity for evaluation of the bowel wall, although the free fluid within the pelvis indicates degree of active inflammation.  Small size of the left adrenal gland may reflect adrenal insufficiency. Correlation with a history of longstanding steroid use recommended.  Signed,  Dulcy Fanny. Earleen Newport, DO  [facility]  [facility]   Electronically Signed   By: Corrie Mckusick D.O.   On: 05/03/2015 11:48   Dg Abd 2 Views  05/09/2015   CLINICAL DATA:  Right lower quadrant pain for 6 days.  EXAM: ABDOMEN - 2 VIEW  COMPARISON:  05/04/2015  FINDINGS: Scattered large and small bowel gas is noted. A few dilated loops of small bowel are again seen with air-fluid levels. This has increased slightly in the interval from the  prior exam. Fecal material is noted throughout the colon. No free air is seen. No bony abnormality is noted.  IMPRESSION: Slight increase in the degree of small bowel dilatation likely related to partial small bowel obstruction as previously diagnosed.   Electronically Signed   By: Inez Catalina M.D.   On: 05/09/2015 15:23   Dg Abd 2 Views  05/04/2015   CLINICAL DATA:  Right lower quadrant pain for 2 days  EXAM: ABDOMEN - 2 VIEW  COMPARISON:  05/03/2015  FINDINGS: Scattered large and small bowel gas is noted. Mild residual prominence of the small bowel is noted. Contrast material is noted within the colon consistent with the recent CT examination. No free air is seen.  IMPRESSION: Slight residual small bowel dilatation. The previously seen changes were likely related to a partial small bowel obstruction   Electronically Signed   By: Inez Catalina M.D.   On: 05/04/2015 14:09   Dg Abd 2 Views  04/19/2015   CLINICAL DATA:  Right lower quadrant abdominal pain for 4 days. Nausea.  EXAM: ABDOMEN - 2 VIEW  COMPARISON:  01/15/2015  FINDINGS: No evidence of bowel obstruction or generalized adynamic ileus. Mild increased stool in the colon. No free air.  No evidence of renal or ureteral stones. Soft tissues are unremarkable. No significant bony abnormality.  IMPRESSION: 1. No acute findings. No evidence of bowel obstruction or generalized adynamic ileus. No free air. 2. Mild increased stool in the colon.   Electronically Signed   By: Lajean Manes M.D.   On: 04/19/2015 11:06   Dg Abd Acute W/chest  05/03/2015   CLINICAL DATA:  New RIGHT mid abdominal pain with nausea vomiting. History of Crohn's.  EXAM: DG ABDOMEN ACUTE W/ 1V CHEST  COMPARISON:  Abdominal radiograph Apr 19, 2015, chest radiograph February 09, 2015 and CT of the chest December 21, 2014  FINDINGS: Cardiomediastinal silhouette is unremarkable. Similar chronic airspace opacities with scarring. No pleural effusion. No pneumothorax. Soft tissue planes and  included osseous structures are nonsuspicious; bilateral breast implants.  Mildly dilated small bowel in LEFT upper quadrant of measures up to 3.5 cm with scattered air-fluid levels at varying levels, paucity of large bowel gas. No intra-abdominal mass effect or pathologic calcifications. No free air. Soft tissue planes and included osseous structures are nonsuspicious.  IMPRESSION: No acute cardiopulmonary process ; stable chronic pulmonary parenchymal changes.  Findings concerning for early small bowel obstruction. As there are varying small bowel air-fluid levels, enteritis could  have this appearance though, less favored considering paucity of large bowel gas.   Electronically Signed   By: Elon Alas   On: 05/03/2015 05:40     CBC  Recent Labs Lab 05/04/15 0618 05/09/15 1220 05/09/15 1811 05/10/15 0440  WBC 4.1 10.0 8.1 5.8  HGB 11.9* 14.5 13.4 11.4*  HCT 35.6* 43.1 40.7 33.9*  PLT 292 353 354 308  MCV 90.6 89.6 90.6 89.9  MCH 30.3 30.1 29.8 30.2  MCHC 33.4 33.6 32.9 33.6  RDW 12.8 12.4 12.5 12.5  LYMPHSABS  --  0.6*  --   --   MONOABS  --  0.2  --   --   EOSABS  --  0.0  --   --   BASOSABS  --  0.0  --   --     Chemistries   Recent Labs Lab 05/04/15 0618 05/09/15 1220 05/09/15 1811 05/10/15 0440  NA 139 140  --  136  K 3.4* 3.9  --  3.8  CL 109 102  --  107  CO2 22 25  --  20*  GLUCOSE 97 136*  --  105*  BUN 6 11  --  8  CREATININE 0.65 0.79 0.66 0.58  CALCIUM 8.3* 10.3  --  8.6*  AST 18 20  --   --   ALT 18 20  --   --   ALKPHOS 28* 41  --   --   BILITOT 0.4 1.1  --   --    ------------------------------------------------------------------------------------------------------------------ estimated creatinine clearance is 91.5 mL/min (by C-G formula based on Cr of 0.58). ------------------------------------------------------------------------------------------------------------------ No results for input(s): HGBA1C in the last 72  hours. ------------------------------------------------------------------------------------------------------------------ No results for input(s): CHOL, HDL, LDLCALC, TRIG, CHOLHDL, LDLDIRECT in the last 72 hours. ------------------------------------------------------------------------------------------------------------------ No results for input(s): TSH, T4TOTAL, T3FREE, THYROIDAB in the last 72 hours.  Invalid input(s): FREET3 ------------------------------------------------------------------------------------------------------------------ No results for input(s): VITAMINB12, FOLATE, FERRITIN, TIBC, IRON, RETICCTPCT in the last 72 hours.  Coagulation profile  Recent Labs Lab 05/09/15 1811  INR 1.09    No results for input(s): DDIMER in the last 72 hours.  Cardiac Enzymes No results for input(s): CKMB, TROPONINI, MYOGLOBIN in the last 168 hours.  Invalid input(s): CK ------------------------------------------------------------------------------------------------------------------ Invalid input(s): POCBNP   Time Spent in minutes   35   Delontae Lamm K M.D on 05/10/2015 at 11:42 AM  Between 7am to 7pm - Pager - [phone]  After 7pm go to [email or web address] - password [facility]  [facility]   Office  [phone]

## 2015-05-11 ENCOUNTER — Ambulatory Visit (HOSPITAL_COMMUNITY): Payer: BLUE CROSS/BLUE SHIELD

## 2015-05-11 ENCOUNTER — Encounter (HOSPITAL_COMMUNITY): Payer: Self-pay | Admitting: Anesthesiology

## 2015-05-11 ENCOUNTER — Inpatient Hospital Stay (HOSPITAL_COMMUNITY): Payer: BLUE CROSS/BLUE SHIELD | Admitting: Anesthesiology

## 2015-05-11 ENCOUNTER — Encounter (HOSPITAL_COMMUNITY)
Admission: EM | Disposition: A | Discharge status: Home / Self Care | Payer: Self-pay | Source: Home / Self Care | Attending: Internal Medicine

## 2015-05-11 HISTORY — PX: COLON RESECTION: SHX5231

## 2015-05-11 LAB — TYPE AND SCREEN
ABO/RH(D): B POS
Antibody Screen: NEGATIVE

## 2015-05-11 LAB — ABO/RH: ABO/RH(D): B POS

## 2015-05-11 SURGERY — COLON RESECTION
Anesthesia: General | Site: Abdomen

## 2015-05-11 MED ORDER — FENTANYL CITRATE (PF) 100 MCG/2ML IJ SOLN: 25.0000 ug | INTRAMUSCULAR | Status: DC | PRN

## 2015-05-11 MED ORDER — DEXAMETHASONE SODIUM PHOSPHATE 10 MG/ML IJ SOLN
INTRAMUSCULAR | Status: AC
  Filled 2015-05-11: qty 1

## 2015-05-11 MED ORDER — HYDROMORPHONE HCL 1 MG/ML IJ SOLN
0.2500 mg | INTRAMUSCULAR | Status: DC | PRN
  Administered 2015-05-11 (×2): 0.5 mg via INTRAVENOUS

## 2015-05-11 MED ORDER — ONDANSETRON HCL 4 MG/2ML IJ SOLN
INTRAMUSCULAR | Status: AC
  Filled 2015-05-11: qty 2

## 2015-05-11 MED ORDER — PROPOFOL 10 MG/ML IV BOLUS
INTRAVENOUS | Status: DC | PRN
  Administered 2015-05-11: 170 mg via INTRAVENOUS

## 2015-05-11 MED ORDER — CIPROFLOXACIN IN D5W 400 MG/200ML IV SOLN
INTRAVENOUS | Status: AC
  Filled 2015-05-11: qty 200

## 2015-05-11 MED ORDER — MIDAZOLAM HCL 2 MG/2ML IJ SOLN
INTRAMUSCULAR | Status: AC
  Filled 2015-05-11: qty 2

## 2015-05-11 MED ORDER — ONDANSETRON HCL 4 MG/2ML IJ SOLN
INTRAMUSCULAR | Status: DC | PRN
  Administered 2015-05-11 (×2): 4 mg via INTRAVENOUS

## 2015-05-11 MED ORDER — LABETALOL HCL 5 MG/ML IV SOLN
INTRAVENOUS | Status: AC
  Filled 2015-05-11: qty 4

## 2015-05-11 MED ORDER — KCL IN DEXTROSE-NACL 10-5-0.45 MEQ/L-%-% IV SOLN
INTRAVENOUS | Status: DC
  Administered 2015-05-12: 04:00:00 via INTRAVENOUS
  Filled 2015-05-11 (×4): qty 1000

## 2015-05-11 MED ORDER — SODIUM CHLORIDE 0.9 % IJ SOLN
INTRAMUSCULAR | Status: AC
  Filled 2015-05-11: qty 20

## 2015-05-11 MED ORDER — HYDROMORPHONE HCL 2 MG/ML IJ SOLN
INTRAMUSCULAR | Status: AC
  Filled 2015-05-11: qty 1

## 2015-05-11 MED ORDER — HYDROMORPHONE HCL 2 MG/ML IJ SOLN
2.0000 mg | INTRAMUSCULAR | Status: DC | PRN
  Administered 2015-05-11 – 2015-05-14 (×11): 2 mg via INTRAVENOUS
  Filled 2015-05-11 (×11): qty 1

## 2015-05-11 MED ORDER — ROCURONIUM BROMIDE 100 MG/10ML IV SOLN
INTRAVENOUS | Status: AC
  Filled 2015-05-11: qty 1

## 2015-05-11 MED ORDER — MIDAZOLAM HCL 5 MG/5ML IJ SOLN
INTRAMUSCULAR | Status: DC | PRN
  Administered 2015-05-11: 2 mg via INTRAVENOUS

## 2015-05-11 MED ORDER — HYDROMORPHONE HCL 1 MG/ML IJ SOLN
INTRAMUSCULAR | Status: AC
  Administered 2015-05-11: 1 mg via INTRAVENOUS
  Filled 2015-05-11: qty 1

## 2015-05-11 MED ORDER — SUCCINYLCHOLINE CHLORIDE 20 MG/ML IJ SOLN
INTRAMUSCULAR | Status: DC | PRN
  Administered 2015-05-11: 100 mg via INTRAVENOUS

## 2015-05-11 MED ORDER — PROPOFOL 10 MG/ML IV BOLUS
INTRAVENOUS | Status: AC
  Filled 2015-05-11: qty 20

## 2015-05-11 MED ORDER — LIDOCAINE HCL (CARDIAC) 20 MG/ML IV SOLN
INTRAVENOUS | Status: DC | PRN
  Administered 2015-05-11: 50 mg via INTRAVENOUS

## 2015-05-11 MED ORDER — LACTATED RINGERS IV SOLN
INTRAVENOUS | Status: DC
  Administered 2015-05-11: 15:00:00 via INTRAVENOUS
  Administered 2015-05-11: 1000 mL via INTRAVENOUS
  Administered 2015-05-11: 16:00:00 via INTRAVENOUS

## 2015-05-11 MED ORDER — NEOSTIGMINE METHYLSULFATE 10 MG/10ML IV SOLN
INTRAVENOUS | Status: DC | PRN
  Administered 2015-05-11: 3 mg via INTRAVENOUS

## 2015-05-11 MED ORDER — PROMETHAZINE HCL 25 MG/ML IJ SOLN
INTRAMUSCULAR | Status: AC
  Filled 2015-05-11: qty 1

## 2015-05-11 MED ORDER — ARTIFICIAL TEARS OP OINT
TOPICAL_OINTMENT | OPHTHALMIC | Status: AC
  Filled 2015-05-11: qty 3.5

## 2015-05-11 MED ORDER — FENTANYL CITRATE (PF) 100 MCG/2ML IJ SOLN
INTRAMUSCULAR | Status: DC | PRN
  Administered 2015-05-11 (×2): 100 ug via INTRAVENOUS
  Administered 2015-05-11: 50 ug via INTRAVENOUS

## 2015-05-11 MED ORDER — POTASSIUM CHLORIDE IN NACL 20-0.9 MEQ/L-% IV SOLN
INTRAVENOUS | Status: DC
  Administered 2015-05-11 – 2015-05-15 (×8): via INTRAVENOUS
  Filled 2015-05-11 (×13): qty 1000

## 2015-05-11 MED ORDER — FENTANYL CITRATE (PF) 250 MCG/5ML IJ SOLN
INTRAMUSCULAR | Status: AC
  Filled 2015-05-11: qty 5

## 2015-05-11 MED ORDER — PHENYLEPHRINE 40 MCG/ML (10ML) SYRINGE FOR IV PUSH (FOR BLOOD PRESSURE SUPPORT)
PREFILLED_SYRINGE | INTRAVENOUS | Status: AC
  Filled 2015-05-11: qty 40

## 2015-05-11 MED ORDER — LABETALOL HCL 5 MG/ML IV SOLN
5.0000 mg | INTRAVENOUS | Status: AC | PRN
  Administered 2015-05-11 (×4): 5 mg via INTRAVENOUS

## 2015-05-11 MED ORDER — PHENYLEPHRINE HCL 10 MG/ML IJ SOLN
INTRAMUSCULAR | Status: DC | PRN
  Administered 2015-05-11: 80 ug via INTRAVENOUS

## 2015-05-11 MED ORDER — NEOSTIGMINE METHYLSULFATE 10 MG/10ML IV SOLN
INTRAVENOUS | Status: AC
  Filled 2015-05-11: qty 1

## 2015-05-11 MED ORDER — MEPERIDINE HCL 50 MG/ML IJ SOLN: 6.2500 mg | INTRAMUSCULAR | Status: DC | PRN

## 2015-05-11 MED ORDER — PROMETHAZINE HCL 25 MG/ML IJ SOLN
6.2500 mg | INTRAMUSCULAR | Status: AC | PRN
  Administered 2015-05-11 (×2): 12.5 mg via INTRAVENOUS

## 2015-05-11 MED ORDER — GLYCOPYRROLATE 0.2 MG/ML IJ SOLN
INTRAMUSCULAR | Status: AC
  Filled 2015-05-11: qty 2

## 2015-05-11 MED ORDER — ROCURONIUM BROMIDE 100 MG/10ML IV SOLN
INTRAVENOUS | Status: DC | PRN
  Administered 2015-05-11: 30 mg via INTRAVENOUS
  Administered 2015-05-11: 5 mg via INTRAVENOUS

## 2015-05-11 MED ORDER — DEXAMETHASONE SODIUM PHOSPHATE 10 MG/ML IJ SOLN
INTRAMUSCULAR | Status: DC | PRN
  Administered 2015-05-11: 10 mg via INTRAVENOUS

## 2015-05-11 MED ORDER — HYDROMORPHONE HCL 1 MG/ML IJ SOLN
INTRAMUSCULAR | Status: DC | PRN
  Administered 2015-05-11: 0.5 mg via INTRAVENOUS
  Administered 2015-05-11: 1 mg via INTRAVENOUS
  Administered 2015-05-11: 0.5 mg via INTRAVENOUS

## 2015-05-11 MED ORDER — GLYCOPYRROLATE 0.2 MG/ML IJ SOLN
INTRAMUSCULAR | Status: DC | PRN
  Administered 2015-05-11: 0.4 mg via INTRAVENOUS
  Administered 2015-05-11: 0.2 mg via INTRAVENOUS

## 2015-05-11 SURGICAL SUPPLY — 40 items
APPLICATOR COTTON TIP 6IN STRL (MISCELLANEOUS) ×2 IMPLANT
BLADE EXTENDED COATED 6.5IN (ELECTRODE) ×2 IMPLANT
BLADE HEX COATED 2.75 (ELECTRODE) ×2 IMPLANT
CLIP TI LARGE 6 (CLIP) IMPLANT
DRAPE LAPAROSCOPIC ABDOMINAL (DRAPES) ×2 IMPLANT
ELECT REM PT RETURN 9FT ADLT (ELECTROSURGICAL) ×2
ELECTRODE REM PT RTRN 9FT ADLT (ELECTROSURGICAL) ×1 IMPLANT
GAUZE SPONGE 4X4 12PLY STRL (GAUZE/BANDAGES/DRESSINGS) ×2 IMPLANT
GLOVE BIOGEL PI IND STRL 7.0 (GLOVE) ×1 IMPLANT
GLOVE BIOGEL PI INDICATOR 7.0 (GLOVE) ×1
GLOVE EUDERMIC 7 POWDERFREE (GLOVE) ×4 IMPLANT
GOWN STRL REUS W/TWL LRG LVL3 (GOWN DISPOSABLE) ×2 IMPLANT
GOWN STRL REUS W/TWL XL LVL3 (GOWN DISPOSABLE) ×4 IMPLANT
LEGGING LITHOTOMY PAIR STRL (DRAPES) ×1 IMPLANT
LIGASURE IMPACT 36 18CM CVD LR (INSTRUMENTS) ×2 IMPLANT
NS IRRIG 1000ML POUR BTL (IV SOLUTION) ×4 IMPLANT
PACK COLON (CUSTOM PROCEDURE TRAY) ×2 IMPLANT
PACK GENERAL/GYN (CUSTOM PROCEDURE TRAY) ×2 IMPLANT
RELOAD PROXIMATE 75MM BLUE (ENDOMECHANICALS) ×2 IMPLANT
RELOAD STAPLE 75 3.8 BLU REG (ENDOMECHANICALS) IMPLANT
SHEARS HARMONIC ACE PLUS 36CM (ENDOMECHANICALS) IMPLANT
STAPLER GUN LINEAR PROX 60 (STAPLE) ×1 IMPLANT
STAPLER PROXIMATE 75MM BLUE (STAPLE) ×2 IMPLANT
STAPLER VISISTAT 35W (STAPLE) IMPLANT
SUT NOV 1 T60/GS (SUTURE) IMPLANT
SUT NOVA NAB DX-16 0-1 5-0 T12 (SUTURE) IMPLANT
SUT NOVA T20/GS 25 (SUTURE) IMPLANT
SUT PDS AB 1 CTX 36 (SUTURE) IMPLANT
SUT PDS AB 1 TP1 96 (SUTURE) ×4 IMPLANT
SUT PROLENE 2 0 KS (SUTURE) IMPLANT
SUT SILK 2 0 (SUTURE) ×2
SUT SILK 2 0 SH CR/8 (SUTURE) ×3 IMPLANT
SUT SILK 2 0SH CR/8 30 (SUTURE) IMPLANT
SUT SILK 2-0 18XBRD TIE 12 (SUTURE) ×1 IMPLANT
SUT SILK 2-0 30XBRD TIE 12 (SUTURE) IMPLANT
SUT SILK 3 0 (SUTURE)
SUT SILK 3 0 SH CR/8 (SUTURE) ×2 IMPLANT
SUT SILK 3-0 18XBRD TIE 12 (SUTURE) ×1 IMPLANT
TRAY FOLEY W/METER SILVER 14FR (SET/KITS/TRAYS/PACK) ×2 IMPLANT
YANKAUER SUCT BULB TIP NO VENT (SUCTIONS) ×2 IMPLANT

## 2015-05-11 NOTE — Anesthesia Procedure Notes (Signed)
Procedure Name: Intubation Date/Time: 05/11/2015 2:15 PM Performed by: Anija Brickner, Virgel Gess Pre-anesthesia Checklist: Patient identified, Emergency Drugs available, Suction available, Patient being monitored and Timeout performed Patient Re-evaluated:Patient Re-evaluated prior to inductionOxygen Delivery Method: Circle system utilized Preoxygenation: Pre-oxygenation with 100% oxygen Intubation Type: IV induction Ventilation: Mask ventilation without difficulty Laryngoscope Size: Mac and 4 Grade View: Grade II Tube type: Oral Tube size: 7.5 mm Number of attempts: 1 Airway Equipment and Method: Stylet Placement Confirmation: ETT inserted through vocal cords under direct vision,  positive ETCO2,  CO2 detector and breath sounds checked- equal and bilateral Secured at: 22 cm Tube secured with: Tape Dental Injury: Teeth and Oropharynx as per pre-operative assessment

## 2015-05-11 NOTE — Progress Notes (Signed)
Patient Demographics  Meghan Charles, is a 44 y.o. female, DOB - 1971-05-06, LAG:[id number]  Admit date - 05/09/2015   Admitting Physician Thurnell Lose, MD  Outpatient Primary MD for the patient is [facility] NOT [facility]  LOS - 2   Chief Complaint  Patient presents with  . Crohn's Disease  . Abdominal Pain        Subjective:   Meghan Charles today has, No headache, No chest pain, mild upper abdominal pain - No Nausea, No new weakness tingling or numbness, No Cough - SOB.    Assessment & Plan    1. Crohn's colitis exacerbation with small bowel stricture related symptoms. Was due for small bowel resection in the coming future, will admit to med surge bed, bowel rest, IV fluids, IV Cipro Flagyl for now,  holding off further Solu-Medrol as she might require surgery and it might hinder prompt healing, Hold oral medications and immunomodulators currently. Being seen by general surgery and GI. Continue supportive care with pain and nausea medications.   Due for ileal dissection by general surgery on 05/11/2015. Will be a low risk candidate for adverse cardiopulmonary outcome. She is otherwise healthy with no medical issues whatsoever.   2. Colonic polyps and gallbladder polyp. Defer to primary GI which is Dr. Hilarie Fredrickson and general surgery.     Code Status: Full  Family Communication: None present  Disposition Plan: Likely home in 3-4 days after surgery   Consults  GI Dr. Hilarie Fredrickson and general surgery   Procedures  ileal resections scheduled for 05/11/2015   DVT Prophylaxis  Lovenox    Lab Results  Component Value Date   PLT 308 05/10/2015    Medications  Scheduled Meds: . ciprofloxacin  400 mg Intravenous Q12H  . enoxaparin (LOVENOX) injection  40 mg Subcutaneous Q24H  .  metronidazole  500 mg Intravenous Q8H   Continuous Infusions: . dextrose 5 % and 0.45 % NaCl with KCl 10 mEq/L     PRN Meds:.guaiFENesin-dextromethorphan, HYDROcodone-acetaminophen, HYDROmorphone (DILAUDID) injection, ondansetron **OR** ondansetron (ZOFRAN) IV, zolpidem  Antibiotics     Anti-infectives    Start     Dose/Rate Route Frequency Ordered Stop   05/09/15 1615  ciprofloxacin (CIPRO) IVPB 400 mg     400 mg 200 mL/hr over 60 Minutes Intravenous Every 12 hours 05/09/15 1612     05/09/15 1615  metroNIDAZOLE (FLAGYL) IVPB 500 mg     500 mg 100 mL/hr over 60 Minutes Intravenous Every 8 hours 05/09/15 1612          Objective:   Filed Vitals:   05/10/15 1413 05/10/15 2154 05/11/15 0500 05/11/15 0610  BP: 142/98 136/85  126/84  Pulse: 93 84  86  Temp: 98.1 F (36.7 C) 97.7 F (36.5 C)  98.1 F (36.7 C)  TempSrc: Oral Oral  Oral  Resp: 18 20  18   Height:      Weight:   66.2 kg (145 lb 15.1 oz)   SpO2: 99% 99%  98%    Wt Readings from Last 3 Encounters:  05/11/15 66.2 kg (145 lb 15.1 oz)  05/03/15 64.411 kg (142 lb)  04/20/15 65.409 kg (144 lb 3.2 oz)     Intake/Output Summary (Last 24 hours) at 05/11/15 1050  Last data filed at 05/11/15 0708  Gross per 24 hour  Intake 3868.75 ml  Output      0 ml  Net 3868.75 ml     Physical Exam  Awake Alert, Oriented X 3, No new F.N deficits, Normal affect [place].AT,PERRAL Supple Neck,No JVD, No cervical lymphadenopathy appriciated.  Symmetrical Chest wall movement, Good air movement bilaterally, CTAB RRR,No Gallops,Rubs or new Murmurs, No Parasternal Heave +ve B.Sounds, Abd Soft, mild upper abdominal tenderness, No organomegaly appriciated, No rebound - guarding or rigidity. No Cyanosis, Clubbing or edema, No new Rash or bruise      Data Review   Micro Results Recent Results (from the past 240 hour(s))  Clostridium Difficile by PCR     Status: None   Collection Time: 05/03/15  8:07 AM  Result Value Ref Range Status     C difficile by pcr NEGATIVE NEGATIVE Final  Surgical pcr screen     Status: None   Collection Time: 05/10/15  9:40 AM  Result Value Ref Range Status   MRSA, PCR NEGATIVE NEGATIVE Final   Staphylococcus aureus NEGATIVE NEGATIVE Final    Comment:        The Xpert SA Assay (FDA approved for NASAL specimens in patients over 20 years of age), is one component of a comprehensive surveillance program.  Test performance has been validated by [facility] for patients greater than or equal to 2 year old. It is not intended to diagnose infection nor to guide or monitor treatment.     Radiology Reports Ct Abdomen Pelvis W Contrast  05/03/2015   CLINICAL DATA:  44 year old female with a history of right lower quadrant pain for several days.  EXAM: CT ABDOMEN AND PELVIS WITH CONTRAST  TECHNIQUE: Multidetector CT imaging of the abdomen and pelvis was performed using the standard protocol following bolus administration of intravenous contrast.  CONTRAST:  141m OMNIPAQUE IOHEXOL 300 MG/ML  SOLN  COMPARISON:  CT abdomen 12/26/2014, chest CT 12/21/2014, CT abdomen 09/22/2014  FINDINGS: Chest:  Surgical changes of prior breast augmentation.  Asymmetric appearance of the thoracic cage again evident.  Heart size within normal limits.  No pericardial fluid/ thickening.  No hiatal hernia.  No confluent airspace disease or pneumothorax.  Abdomen/ pelvis:  Unremarkable liver.  Unremarkable spleen.  Left adrenal gland a small. Nodule involving the medial limb of the right adrenal gland. 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  Unremarkable appearance of the bilateral kidneys.  Unremarkable appearance of the pancreas.  Unremarkable gallbladder.  No intra abdominal air. Small amount of free fluid layered within the pelvis, with low Hounsfield units.  Enteric contrast within stomach and small bowel, with contrast traversing the ileocecal valve. The colon is relatively decompressed with no adjacent  inflammatory changes.  Borderline dilated small bowel loops within the mid and distal small bowel. There is a transition from dilated small bowel to decompressed small bowel just proximal to the distal ileum and ileocecal valve (image 69 of series 2). This segment of small bowel is identical to the location of previously identified narrowing on the CT 12/26/2014. Contrast does traverse this segment.  The enteric contrast present on the current study decreases the sensitivity for detection of hyper enhancement of the mucosa an bowel wall. No associated inflammation within the mesenteric. No significantly increased blood flow within the mesenteric vessels (negative "comb sign").  Normal appendix.  Unremarkable appearance of the urinary bladder. Unremarkable appearance of the adnexa.  No evidence of acute bony abnormality.  No significant degenerative changes. No sclerotic changes of the iliosacral joints. Unremarkable appearance of the proximal femurs.  IMPRESSION: Partial small bowel obstruction of the distal ileum with borderline proximal dilated small bowel loops. The transition point is located at the site of prior small bowel inflammation identified on CT 12/26/2014, compatible with developing stricture. The presence of enteric contrast on the current CT decreases the sensitivity for evaluation of the bowel wall, although the free fluid within the pelvis indicates degree of active inflammation.  Small size of the left adrenal gland may reflect adrenal insufficiency. Correlation with a history of longstanding steroid use recommended.  Signed,  Dulcy Fanny. Earleen Newport, DO  [facility]  [facility]   Electronically Signed   By: Corrie Mckusick D.O.   On: 05/03/2015 11:48   Dg Abd 2 Views  05/09/2015   CLINICAL DATA:  Right lower quadrant pain for 6 days.  EXAM: ABDOMEN - 2 VIEW  COMPARISON:  05/04/2015  FINDINGS: Scattered large and small bowel gas is noted. A few dilated loops  of small bowel are again seen with air-fluid levels. This has increased slightly in the interval from the prior exam. Fecal material is noted throughout the colon. No free air is seen. No bony abnormality is noted.  IMPRESSION: Slight increase in the degree of small bowel dilatation likely related to partial small bowel obstruction as previously diagnosed.   Electronically Signed   By: Inez Catalina M.D.   On: 05/09/2015 15:23   Dg Abd 2 Views  05/04/2015   CLINICAL DATA:  Right lower quadrant pain for 2 days  EXAM: ABDOMEN - 2 VIEW  COMPARISON:  05/03/2015  FINDINGS: Scattered large and small bowel gas is noted. Mild residual prominence of the small bowel is noted. Contrast material is noted within the colon consistent with the recent CT examination. No free air is seen.  IMPRESSION: Slight residual small bowel dilatation. The previously seen changes were likely related to a partial small bowel obstruction   Electronically Signed   By: Inez Catalina M.D.   On: 05/04/2015 14:09   Dg Abd 2 Views  04/19/2015   CLINICAL DATA:  Right lower quadrant abdominal pain for 4 days. Nausea.  EXAM: ABDOMEN - 2 VIEW  COMPARISON:  01/15/2015  FINDINGS: No evidence of bowel obstruction or generalized adynamic ileus. Mild increased stool in the colon. No free air.  No evidence of renal or ureteral stones. Soft tissues are unremarkable. No significant bony abnormality.  IMPRESSION: 1. No acute findings. No evidence of bowel obstruction or generalized adynamic ileus. No free air. 2. Mild increased stool in the colon.   Electronically Signed   By: Lajean Manes M.D.   On: 04/19/2015 11:06   Dg Abd Acute W/chest  05/03/2015   CLINICAL DATA:  New RIGHT mid abdominal pain with nausea vomiting. History of Crohn's.  EXAM: DG ABDOMEN ACUTE W/ 1V CHEST  COMPARISON:  Abdominal radiograph Apr 19, 2015, chest radiograph February 09, 2015 and CT of the chest December 21, 2014  FINDINGS: Cardiomediastinal silhouette is unremarkable. Similar  chronic airspace opacities with scarring. No pleural effusion. No pneumothorax. Soft tissue planes and included osseous structures are nonsuspicious; bilateral breast implants.  Mildly dilated small bowel in LEFT upper quadrant of measures up to 3.5 cm with scattered air-fluid levels at varying levels, paucity of large bowel gas. No intra-abdominal mass effect or pathologic calcifications. No free air. Soft tissue planes and included osseous structures are nonsuspicious.  IMPRESSION:  No acute cardiopulmonary process ; stable chronic pulmonary parenchymal changes.  Findings concerning for early small bowel obstruction. As there are varying small bowel air-fluid levels, enteritis could have this appearance though, less favored considering paucity of large bowel gas.   Electronically Signed   By: Elon Alas   On: 05/03/2015 05:40     CBC  Recent Labs Lab 05/09/15 1220 05/09/15 1811 05/10/15 0440  WBC 10.0 8.1 5.8  HGB 14.5 13.4 11.4*  HCT 43.1 40.7 33.9*  PLT 353 354 308  MCV 89.6 90.6 89.9  MCH 30.1 29.8 30.2  MCHC 33.6 32.9 33.6  RDW 12.4 12.5 12.5  LYMPHSABS 0.6*  --   --   MONOABS 0.2  --   --   EOSABS 0.0  --   --   BASOSABS 0.0  --   --     Chemistries   Recent Labs Lab 05/09/15 1220 05/09/15 1811 05/10/15 0440  NA 140  --  136  K 3.9  --  3.8  CL 102  --  107  CO2 25  --  20*  GLUCOSE 136*  --  105*  BUN 11  --  8  CREATININE 0.79 0.66 0.58  CALCIUM 10.3  --  8.6*  AST 20  --   --   ALT 20  --   --   ALKPHOS 41  --   --   BILITOT 1.1  --   --    ------------------------------------------------------------------------------------------------------------------ estimated creatinine clearance is 91.5 mL/min (by C-G formula based on Cr of 0.58). ------------------------------------------------------------------------------------------------------------------ No results for input(s): HGBA1C in the last 72  hours. ------------------------------------------------------------------------------------------------------------------ No results for input(s): CHOL, HDL, LDLCALC, TRIG, CHOLHDL, LDLDIRECT in the last 72 hours. ------------------------------------------------------------------------------------------------------------------ No results for input(s): TSH, T4TOTAL, T3FREE, THYROIDAB in the last 72 hours.  Invalid input(s): FREET3 ------------------------------------------------------------------------------------------------------------------ No results for input(s): VITAMINB12, FOLATE, FERRITIN, TIBC, IRON, RETICCTPCT in the last 72 hours.  Coagulation profile  Recent Labs Lab 05/09/15 1811  INR 1.09    No results for input(s): DDIMER in the last 72 hours.  Cardiac Enzymes No results for input(s): CKMB, TROPONINI, MYOGLOBIN in the last 168 hours.  Invalid input(s): CK ------------------------------------------------------------------------------------------------------------------ Invalid input(s): POCBNP   Time Spent in minutes   35   SINGH,PRASHANT K M.D on 05/11/2015 at 10:50 AM  Between 7am to 7pm - Pager - [phone]  After 7pm go to [email or web address] - password [facility]  [facility]   Office  [phone]

## 2015-05-11 NOTE — Progress Notes (Signed)
Spoke with on call surgeon last night; ok to give Lovenox evening before surgery.Azzie Glatter [place]

## 2015-05-11 NOTE — Op Note (Signed)
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 Dalbert Batman, M.D., FACS  Assistant:                      Nedra Hai, M.D.  Operative Indications:   This is a 44 year old Caucasian female who has been treated for Crohn's enteritis since June 2015. She's had aggressive treatment with prednisone, azathioprine, and Entyvio.    She was last on prednisone in January. She's had a colonoscopy last fall showed a normal colon and normal terminal ileum to 20 cm. Capsule endoscopy showed mucosal disease of the small bowel. She was hospitalized recently for small bowel obstruction secondary to stricture. The patient wanted to postpone surgery and was discharged home. She had to be readmitted on May 22 with worsening abdominal pain nausea and vomiting. With bowel rest her symptoms have settled down. She has agreed to proceed with surgery for resection of her strictured Crohn's disease and partial obstruction.  Operative Findings:       There was a 5 cm palpable mass in the mid ileum, about 3 feet proximal to the ileocecal valve.  This was not a long strictured segment but a very short focal area of disease. There was some fat creeping in this area. The mesentery was not particularly thickened. The small bowel was thickened and dilated proximal to this due to obstruction, and there was no obstruction distal to this. The terminal ileum cecum right colon looks normal. There are multiple tiny miliary nodules of the mesentery and peritoneum in the lower abdomen and pelvis. I was concerned about  neoplasia and sent one of these to the lab and frozen section revealed benign granulomatous nodule consistent with Crohn's disease. I did thoroughly explore her abdomen and found no evidence of uterine or ovarian neoplasm, no other mass in the small bowel. No other stricture in the small bowel. The proximal small bowel looked quite normal. The spleen and liver felt normal. The gallbladder, portal triad and head of pancreas felt normal. The stomach and duodenum felt normal. I felt that we were dealing with benign Crohn's disease.  Procedure in Detail:          Following the induction of general endotracheal anesthesia a Foley catheter was placed and intravenous antibiotics were given. The abdomen was prepped and draped in a sterile fashion. Surgical timeout was performed. Midline laparotomy incision was made about 2-1/2 inches above the umbilicus and about 0-3/5 inches below the umbilicus. The fascia was incised in the midline. Abdominal cavity was entered and explored with findings as described above.     I eviscerated the small bowel. I explored the subdiaphragmatic spaces, abdomen, retroperitoneum, and pelvis. I found a larger nodule of the mesentery near the ileocecal valve. This was excised and sent for frozen section. Dr. Donato Heinz called and said that this  was a benign granulomatous nodule consistent with her diagnosis of Crohn's disease. I found only the single focal area strictured mass in the mid ileum. I divided the small bowel about 6 inches proximal and about 6 inches distal to this mass with a GIA stapling device. I divided the mesentery with the LigaSure device and ligated a couple of the larger mesenteric vessels with 2-0 silk ties. The specimen was sent to pathology. Anastomosis was created between the proximal and distal segments of small bowel using a GIA stapling device. The bowel was, healthy, bled freely. The common defect was closed with a TA 60 stapling device. I placed a few extra sutures of  3-0 silk to reinforce the staple line at critical points. The mesentery was closed with interrupted figure-of-eight sutures of 2-0 silk. I checked the anastomosis and it did not appear to have any area of leak anywhere. I returned the bowel to its anatomic position and irrigated the abdomen with about 3 L of saline. There did not appear to be any bleeding. The midline fascia was closed with a running suture of #1, double-stranded PDS and skin closed with skin staples. Clean  bandage was placed and the patient taken to recovery room in stable condition. EBL 75 mL. Counts correct. Complications none.          Edsel Petrin. Dalbert Batman, M.D., FACS General and Minimally Invasive Surgery Breast and Colorectal Surgery  05/11/2015 3:36 PM

## 2015-05-11 NOTE — Anesthesia Postprocedure Evaluation (Signed)
  Anesthesia Post-op Note  Patient: Meghan Charles  Procedure(s) Performed: Procedure(s) (LRB): Laparotomy with resection of Chron's Disease small bowel resection biopsy mesenteric nodule (N/A)  Patient Location: PACU  Anesthesia Type: General  Level of Consciousness: awake and alert   Airway and Oxygen Therapy: Patient Spontanous Breathing  Post-op Pain: mild  Post-op Assessment: Post-op Vital signs reviewed, Patient's Cardiovascular Status Stable, Respiratory Function Stable, Patent Airway and No signs of Nausea or vomiting  Last Vitals:  Filed Vitals:   05/11/15 1625  BP: 168/102  Pulse: 73  Temp:   Resp: 12    Post-op Vital Signs: stable   Complications: No apparent anesthesia complications

## 2015-05-11 NOTE — Transfer of Care (Signed)
Immediate Anesthesia Transfer of Care Note  Patient: Meghan Charles  Procedure(s) Performed: Procedure(s): Laparotomy with resection of Chron's Disease small bowel resection biopsy mesenteric nodule (N/A)  Patient Location: PACU  Anesthesia Type:General  Level of Consciousness:  sedated, patient cooperative and responds to stimulation  Airway & Oxygen Therapy:Patient Spontanous Breathing and Patient connected to face mask oxgen  Post-op Assessment:  Report given to PACU RN and Post -op Vital signs reviewed and stable  Post vital signs:  Reviewed and stable  Last Vitals:  Filed Vitals:   05/11/15 1159  BP: 149/107  Pulse: 96  Temp: 36.7 C  Resp: 18    Complications: No apparent anesthesia complications

## 2015-05-11 NOTE — Progress Notes (Signed)
[facility] Progress Note     Subjective: Pt hurting today.  No N/V.  Ambulating OOB well.  Anxious since surgery was pushed back to 1:30.  No questions/concerns.  Objective: Vital signs in last 24 hours: Temp:  [97.7 F (36.5 C)-98.1 F (36.7 C)] 98.1 F (36.7 C) (05/24 0610) Pulse Rate:  [84-93] 86 (05/24 0610) Resp:  [18-20] 18 (05/24 0610) BP: (126-142)/(84-98) 126/84 mmHg (05/24 0610) SpO2:  [98 %-99 %] 98 % (05/24 0610) Weight:  [66.2 kg (145 lb 15.1 oz)] 66.2 kg (145 lb 15.1 oz) (05/24 0500) Last BM Date: 05/08/15  Intake/Output from previous day: 05/23 0701 - 05/24 0700 In: 2439.6 [I.V.:1639.6; IV Piggyback:800] Out: -  Intake/Output this shift:    PE: Gen:  Alert, NAD, pleasant Abd: Soft, tender in right side of abdomen, +BS, no HSM   Lab Results:   Recent Labs  05-16-2015 1811 05/10/15 0440  WBC 8.1 5.8  HGB 13.4 11.4*  HCT 40.7 33.9*  PLT 354 308   BMET  Recent Labs  2015/05/16 1220 May 16, 2015 1811 05/10/15 0440  NA 140  --  136  K 3.9  --  3.8  CL 102  --  107  CO2 25  --  20*  GLUCOSE 136*  --  105*  BUN 11  --  8  CREATININE 0.79 0.66 0.58  CALCIUM 10.3  --  8.6*   PT/INR  Recent Labs  2015/05/16 1811  LABPROT 14.3  INR 1.09   CMP     Component Value Date/Time   NA 136 05/10/2015 0440   K 3.8 05/10/2015 0440   CL 107 05/10/2015 0440   CO2 20* 05/10/2015 0440   GLUCOSE 105* 05/10/2015 0440   BUN 8 05/10/2015 0440   CREATININE 0.58 05/10/2015 0440   CALCIUM 8.6* 05/10/2015 0440   PROT 8.7* 05/16/15 1220   ALBUMIN 4.8 05-16-15 1220   AST 20 05-16-2015 1220   ALT 20 May 16, 2015 1220   ALKPHOS 41 2015/05/16 1220   BILITOT 1.1 May 16, 2015 1220   GFRNONAA >60 05/10/2015 0440   GFRAA >60 05/10/2015 0440   Lipase     Component Value Date/Time   LIPASE 28 05-16-15 1220       Studies/Results: Dg Abd 2 Views  May 16, 2015   CLINICAL DATA:  Right lower quadrant pain for 6 days.  EXAM: ABDOMEN - 2 VIEW   COMPARISON:  05/04/2015  FINDINGS: Scattered large and small bowel gas is noted. A few dilated loops of small bowel are again seen with air-fluid levels. This has increased slightly in the interval from the prior exam. Fecal material is noted throughout the colon. No free air is seen. No bony abnormality is noted.  IMPRESSION: Slight increase in the degree of small bowel dilatation likely related to partial small bowel obstruction as previously diagnosed.   Electronically Signed   By: Inez Catalina M.D.   On: 2015-05-16 15:23    Anti-infectives: Anti-infectives    Start     Dose/Rate Route Frequency Ordered Stop   05/16/2015 1615  ciprofloxacin (CIPRO) IVPB 400 mg     400 mg 200 mL/hr over 60 Minutes Intravenous Every 12 hours 05-16-2015 1612     16-May-2015 1615  metroNIDAZOLE (FLAGYL) IVPB 500 mg     500 mg 100 mL/hr over 60 Minutes Intravenous Every 8 hours May 16, 2015 1612         Assessment/Plan Crohn's disease with stricture of near terminal ileum causing partial SBO. -Plan for intestinal resection today -  distal ileum, ileocecal valve, cecum, and appendix -Continue Cipro and Flagyl -Discontinue steroids -NPO, IVF, pain control, antiemetics -Dr. Hilarie Fredrickson following     LOS: 2 days    DORT, [facility] 05/11/2015, 7:39 AM Pager: [phone]

## 2015-05-12 ENCOUNTER — Encounter (HOSPITAL_COMMUNITY): Payer: Self-pay | Admitting: General Surgery

## 2015-05-12 LAB — COMPREHENSIVE METABOLIC PANEL
ALK PHOS: 35 U/L — AB (ref 38–126)
ALT: 16 U/L (ref 14–54)
AST: 16 U/L (ref 15–41)
Albumin: 4 g/dL (ref 3.5–5.0)
Anion gap: 11 (ref 5–15)
BILIRUBIN TOTAL: 0.4 mg/dL (ref 0.3–1.2)
CHLORIDE: 101 mmol/L (ref 101–111)
CO2: 23 mmol/L (ref 22–32)
Calcium: 9.1 mg/dL (ref 8.9–10.3)
Creatinine, Ser: 0.4 mg/dL — ABNORMAL LOW (ref 0.44–1.00)
Glucose, Bld: 130 mg/dL — ABNORMAL HIGH (ref 65–99)
POTASSIUM: 3.9 mmol/L (ref 3.5–5.1)
Sodium: 135 mmol/L (ref 135–145)
Total Protein: 7.4 g/dL (ref 6.5–8.1)

## 2015-05-12 LAB — CBC
HCT: 39.3 % (ref 36.0–46.0)
Hemoglobin: 13.2 g/dL (ref 12.0–15.0)
MCH: 29.4 pg (ref 26.0–34.0)
MCHC: 33.6 g/dL (ref 30.0–36.0)
MCV: 87.5 fL (ref 78.0–100.0)
PLATELETS: 329 10*3/uL (ref 150–400)
RBC: 4.49 MIL/uL (ref 3.87–5.11)
RDW: 12.1 % (ref 11.5–15.5)
WBC: 10.5 10*3/uL (ref 4.0–10.5)

## 2015-05-12 MED ORDER — ONDANSETRON HCL 4 MG/2ML IJ SOLN
4.0000 mg | Freq: Four times a day (QID) | INTRAMUSCULAR | Status: DC | PRN
  Administered 2015-05-12 – 2015-05-16 (×10): 4 mg via INTRAVENOUS
  Filled 2015-05-12 (×10): qty 2

## 2015-05-12 MED ORDER — CIPROFLOXACIN IN D5W 400 MG/200ML IV SOLN
400.0000 mg | Freq: Two times a day (BID) | INTRAVENOUS | Status: AC
  Administered 2015-05-12 (×2): 400 mg via INTRAVENOUS
  Filled 2015-05-12 (×2): qty 200

## 2015-05-12 MED ORDER — HYDRALAZINE HCL 20 MG/ML IJ SOLN: 10.0000 mg | Freq: Four times a day (QID) | INTRAMUSCULAR | Status: DC | PRN

## 2015-05-12 MED ORDER — OXYCODONE-ACETAMINOPHEN 5-325 MG PO TABS
1.0000 | ORAL_TABLET | ORAL | Status: DC | PRN
  Administered 2015-05-16 – 2015-05-17 (×4): 2 via ORAL
  Filled 2015-05-12 (×4): qty 2

## 2015-05-12 MED ORDER — PROMETHAZINE HCL 25 MG/ML IJ SOLN
12.5000 mg | Freq: Four times a day (QID) | INTRAMUSCULAR | Status: AC | PRN
  Administered 2015-05-12 (×2): 12.5 mg via INTRAVENOUS
  Filled 2015-05-12 (×2): qty 1

## 2015-05-12 MED ORDER — ONDANSETRON HCL 4 MG PO TABS: 4.0000 mg | ORAL_TABLET | Freq: Four times a day (QID) | ORAL | Status: DC | PRN

## 2015-05-12 MED ORDER — HYDROMORPHONE HCL 1 MG/ML IJ SOLN: 1.0000 mg | INTRAMUSCULAR | Status: DC | PRN

## 2015-05-12 MED ORDER — FENTANYL 50 MCG/HR TD PT72
50.0000 ug | MEDICATED_PATCH | TRANSDERMAL | Status: AC
  Administered 2015-05-12: 50 ug via TRANSDERMAL
  Filled 2015-05-12: qty 1

## 2015-05-12 MED ORDER — METHOCARBAMOL 1000 MG/10ML IJ SOLN
1000.0000 mg | Freq: Three times a day (TID) | INTRAVENOUS | Status: DC | PRN
  Administered 2015-05-12: 1000 mg via INTRAVENOUS
  Filled 2015-05-12 (×3): qty 10

## 2015-05-12 MED ORDER — METRONIDAZOLE IN NACL 5-0.79 MG/ML-% IV SOLN
500.0000 mg | Freq: Three times a day (TID) | INTRAVENOUS | Status: AC
  Administered 2015-05-12 (×2): 500 mg via INTRAVENOUS
  Filled 2015-05-12 (×2): qty 100

## 2015-05-12 MED ORDER — ENOXAPARIN SODIUM 40 MG/0.4ML ~~LOC~~ SOLN
40.0000 mg | SUBCUTANEOUS | Status: DC
  Administered 2015-05-13 – 2015-05-17 (×5): 40 mg via SUBCUTANEOUS
  Filled 2015-05-12 (×7): qty 0.4

## 2015-05-12 NOTE — Progress Notes (Signed)
[facility] Progress Note  1 Day Post-Op  Subjective: Pt said she had a bad night secondary to pain and not being able to sleep.  She's feeling much better now, but still quite sore and experiencing nausea.  No vomiting.  Ambulating some OOB.  Using IS.    Objective: Vital signs in last 24 hours: Temp:  [97 F (36.1 C)-98.9 F (37.2 C)] 98.1 F (36.7 C) (05/25 0500) Pulse Rate:  [70-107] 107 (05/25 0500) Resp:  [12-22] 16 (05/25 0500) BP: (149-178)/(96-114) 161/99 mmHg (05/25 0500) SpO2:  [95 %-100 %] 95 % (05/25 0500) Weight:  [62.1 kg (136 lb 14.5 oz)] 62.1 kg (136 lb 14.5 oz) (05/25 0500) Last BM Date: 05/08/15  Intake/Output from previous day: 05/24 0701 - 05/25 0700 In: 3929.2 [I.V.:3629.2; IV Piggyback:300] Out: 32 [Urine:5210; Blood:50] Intake/Output this shift: Total I/O In: -  Out: 800 [Urine:800]  PE: Gen:  Alert, NAD, pleasant Abd: Soft, mildly distended, quite tender to palpation, few BS, no HSM, incisions with sanguinous drainage on honeycomb dressing.  Lab Results:   Recent Labs  05/10/15 0440 05/12/15 0400  WBC 5.8 10.5  HGB 11.4* 13.2  HCT 33.9* 39.3  PLT 308 329   BMET  Recent Labs  05/10/15 0440 05/12/15 0400  NA 136 135  K 3.8 3.9  CL 107 101  CO2 20* 23  GLUCOSE 105* 130*  BUN 8 <5*  CREATININE 0.58 0.40*  CALCIUM 8.6* 9.1   PT/INR  Recent Labs  05/09/15 1811  LABPROT 14.3  INR 1.09   CMP     Component Value Date/Time   NA 135 05/12/2015 0400   K 3.9 05/12/2015 0400   CL 101 05/12/2015 0400   CO2 23 05/12/2015 0400   GLUCOSE 130* 05/12/2015 0400   BUN <5* 05/12/2015 0400   CREATININE 0.40* 05/12/2015 0400   CALCIUM 9.1 05/12/2015 0400   PROT 7.4 05/12/2015 0400   ALBUMIN 4.0 05/12/2015 0400   AST 16 05/12/2015 0400   ALT 16 05/12/2015 0400   ALKPHOS 35* 05/12/2015 0400   BILITOT 0.4 05/12/2015 0400   GFRNONAA >60 05/12/2015 0400   GFRAA >60 05/12/2015 0400   Lipase     Component Value Date/Time    LIPASE 28 05/09/2015 1220       Studies/Results: No results found.  Anti-infectives: Anti-infectives    Start     Dose/Rate Route Frequency Ordered Stop   05/12/15 0800  ciprofloxacin (CIPRO) IVPB 400 mg     400 mg 200 mL/hr over 60 Minutes Intravenous Every 12 hours 05/12/15 0725 05/13/15 0759   05/12/15 0800  metroNIDAZOLE (FLAGYL) IVPB 500 mg     500 mg 100 mL/hr over 60 Minutes Intravenous Every 8 hours 05/12/15 0725 05/12/15 2359   05/09/15 1615  ciprofloxacin (CIPRO) IVPB 400 mg  Status:  Discontinued     400 mg 200 mL/hr over 60 Minutes Intravenous Every 12 hours 05/09/15 1612 05/12/15 0725   05/09/15 1615  metroNIDAZOLE (FLAGYL) IVPB 500 mg  Status:  Discontinued     500 mg 100 mL/hr over 60 Minutes Intravenous Every 8 hours 05/09/15 1612 05/12/15 0725       Assessment/Plan Crohn's enteritis with partial obstruction at mid ileum, miliary granulomatous nodules of lower abdomen -POD #1 s/p Ex Lap, biopsy of mesenteric nodules, segmental resection of 12 inches of the mid ileum with primary anastamosis -Continue Cipro and Flagyl just 2 post-operative doses ordered -Holding steroids -NPO, IVF, pain control, antiemetics -Dr. Hilarie Fredrickson following for  Crohn's -Ambulate and IS (up to 1250) -SCD's and lovenox -Pain control is adequate at this time, but will add robaxin and ice, ?PCA if needed -NPO with ice chips for now until bowel function returns    LOS: 3 days    Nat Christen 05/12/2015, 9:28 AM Pager: [phone]

## 2015-05-12 NOTE — Progress Notes (Signed)
RN went in room at 1:30pm and pt sitting at bedside vomiting dark brown emesis. PRN Zofran administered with minimal relief. Jinny Blossom, [place] notified. Pt assessed by Modena Jansky and order to place NG if pt vomits again. Pt later called RN to room and requested that NG be inserted. 14 Fr inserted in R nare. Pt tolerated fairly. Will, PA made aware. About 100 ccs of light brown fluid returned, but pt did drink water during insertion of NG tube. Will continue to monitor.

## 2015-05-12 NOTE — Progress Notes (Signed)
    Progress Note   Subjective  bad night, was in a lot of pain   Objective   Vital signs in last 24 hours: Temp:  [97 F (36.1 C)-98.9 F (37.2 C)] 98.1 F (36.7 C) (05/25 0500) Pulse Rate:  [70-107] 107 (05/25 0500) Resp:  [12-22] 16 (05/25 0500) BP: (149-178)/(96-114) 161/99 mmHg (05/25 0500) SpO2:  [95 %-100 %] 95 % (05/25 0500) Weight:  [136 lb 14.5 oz (62.1 kg)] 136 lb 14.5 oz (62.1 kg) (05/25 0500) Last BM Date: 05/08/15 General:    white female in NAD Heart:  Regular rate and rhythm Abdomen:  Soft, nondistended. Hypoactive bowel sounds. No wound drainage. Extremities:  Without edema. Neurologic:  Alert and oriented,  grossly normal neurologically. Psych:  Cooperative. Normal mood and affect.  Lab Results:  Recent Labs  05/09/15 1811 05/10/15 0440 05/12/15 0400  WBC 8.1 5.8 10.5  HGB 13.4 11.4* 13.2  HCT 40.7 33.9* 39.3  PLT 354 308 329   BMET  Recent Labs  05/09/15 1220 05/09/15 1811 05/10/15 0440 05/12/15 0400  NA 140  --  136 135  K 3.9  --  3.8 3.9  CL 102  --  107 101  CO2 25  --  20* 23  GLUCOSE 136*  --  105* 130*  BUN 11  --  8 <5*  CREATININE 0.79 0.66 0.58 0.40*  CALCIUM 10.3  --  8.6* 9.1   LFT  Recent Labs  05/12/15 0400  PROT 7.4  ALBUMIN 4.0  AST 16  ALT 16  ALKPHOS 35*  BILITOT 0.4      Assessment / Plan:   44 year old female with Crohn's enteritis/ small bowel obstruction secondary to stricturing. Patient is s/p exp lap and resection of 12 inches of the mid ileum with a primary anastomosis yesterday. Feels a little better today. Plans to ambulate when Mom arrives. Immunomodulator on hold, will resume once wound heals.     LOS: 3 days   Tye Savoy  05/12/2015, 8:46 AM

## 2015-05-12 NOTE — Progress Notes (Signed)
Patient Demographics  Meghan Charles, is a 44 y.o. female, DOB - [birth year], OJJ:[id number]  Admit date - 05/09/2015   Admitting Physician Thurnell Lose, MD  Outpatient Primary MD for the patient is PROVIDER NOT Webb  LOS - 3   Chief Complaint  Patient presents with  . Crohn's Disease  . Abdominal Pain        Subjective:   [name] today has, No headache, No chest pain, mild upper abdominal pain - No Nausea, No new weakness tingling or numbness, No Cough - SOB.    Assessment & Plan    1. 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  She underwent Exploratory laparotomy, biopsy mesenteric nodule, segmental resection of 12 inches of mid ileum with primary anastomosis by Dr. Dalbert Batman on 05/11/2015.  Currently has postop ileus, nothing by mouth, supportive care with pain medications and IV fluid. GI and general surgery following. On Cipro Flagyl GI monitoring duration.    2. Colonic polyps and gallbladder polyp. Defer to primary GI which is Dr. Hilarie Fredrickson and general surgery.    3. Hypertension. Secondary to pain. Pain control, as needed IV hydralazine.    Code Status: Full  Family Communication: None present  Disposition Plan: Likely home in 3-4 days after surgery   Consults  GI Dr. Hilarie Fredrickson and general surgery   Procedures   05/11/2015 By Dr Dalbert Batman - Exploratory laparotomy, biopsy mesenteric nodule, segmental resection of 12 inches of mid ileum with primary anastomosis   DVT Prophylaxis  Lovenox    Lab Results  Component Value Date   PLT 329 05/12/2015    Medications  Scheduled Meds: . ciprofloxacin  400 mg Intravenous Q12H  . enoxaparin (LOVENOX)  injection  40 mg Subcutaneous Q24H  . metronidazole  500 mg Intravenous Q8H   Continuous Infusions: . 0.9 % NaCl with KCl 20 mEq / L 125 mL/hr at 05/11/15 1746   PRN Meds:.HYDROmorphone (DILAUDID) injection, HYDROmorphone (DILAUDID) injection, methocarbamol (ROBAXIN)  IV, ondansetron **OR** ondansetron (ZOFRAN) IV, oxyCODONE-acetaminophen, zolpidem  Antibiotics     Anti-infectives    Start     Dose/Rate Route Frequency Ordered Stop   05/12/15 0800  ciprofloxacin (CIPRO) IVPB 400 mg     400 mg 200 mL/hr over 60 Minutes Intravenous Every 12 hours 05/12/15 0725 05/13/15 0759   05/12/15 0800  metroNIDAZOLE (FLAGYL) IVPB 500 mg     500 mg 100 mL/hr over 60 Minutes Intravenous Every 8 hours 05/12/15 0725 05/12/15 2359   05/09/15 1615  ciprofloxacin (CIPRO) IVPB 400 mg  Status:  Discontinued     400 mg 200 mL/hr over 60 Minutes Intravenous Every 12 hours 05/09/15 1612 05/12/15 0725   05/09/15 1615  metroNIDAZOLE (FLAGYL) IVPB 500 mg  Status:  Discontinued     500 mg 100 mL/hr over 60 Minutes Intravenous Every 8 hours 05/09/15 1612 05/12/15 0725        Objective:   Filed Vitals:   05/11/15 1855 05/11/15 2000 05/11/15 2148 05/12/15 0500  BP: 166/114 172/107 178/109 161/99  Pulse: 103 107 97 107  Temp: 98.3 F (36.8 C) 98.4 F (36.9 C) 98.9 F (37.2 C) 98.1 F (36.7 C)  TempSrc: Oral Oral Oral   Resp: 15 16 16 16   Height:    5' 8"  (1.727 m)  Weight:    62.1 kg (136 lb 14.5 oz)  SpO2: 100% 98% 100% 95%    Wt Readings from Last 3 Encounters:  05/12/15 62.1 kg (136 lb 14.5 oz)  05/03/15 64.411 kg (142 lb)  04/20/15 65.409 kg (144 lb 3.2 oz)     Intake/Output Summary (Last 24 hours) at 05/12/15 1035 Last data filed at 05/12/15 0800  Gross per 24 hour  Intake   2400 ml  Output   6060 ml  Net  -3660 ml     Physical Exam  Awake Alert, Oriented X 3, No new F.N deficits, Normal affect [place].AT,PERRAL Supple Neck,No JVD, No cervical lymphadenopathy appriciated.   Symmetrical Chest wall movement, Good air movement bilaterally, CTAB RRR,No Gallops,Rubs or new Murmurs, No Parasternal Heave No bowel sounds, midline incision scar stable under bandage, No organomegaly appriciated, No rebound - guarding or rigidity. No Cyanosis, Clubbing or edema, No new Rash or bruise      Data Review   Micro Results Recent Results (from the past 240 hour(s))  Clostridium Difficile by PCR     Status: None   Collection Time: 05/03/15  8:07 AM  Result Value Ref Range Status   C difficile by pcr NEGATIVE NEGATIVE Final  Surgical pcr screen     Status: None   Collection Time: 05/10/15  9:40 AM  Result Value Ref Range Status   MRSA, PCR NEGATIVE NEGATIVE Final   Staphylococcus aureus NEGATIVE NEGATIVE Final    Comment:        The Xpert SA Assay (FDA approved for NASAL specimens in patients over 78 years of age), is one component of a comprehensive surveillance program.  Test performance has been validated by [facility] for patients greater than or equal to 53 year old. It is not intended to diagnose infection nor to guide or monitor treatment.     Radiology Reports Ct Abdomen Pelvis W Contrast  05/03/2015   CLINICAL DATA:  44 year old female with a history of right lower quadrant pain for several days.  EXAM: CT ABDOMEN AND PELVIS WITH CONTRAST  TECHNIQUE: Multidetector CT imaging of the abdomen and pelvis was performed using the standard protocol following bolus administration of intravenous contrast.  CONTRAST:  126m OMNIPAQUE IOHEXOL 300 MG/ML  SOLN  COMPARISON:  CT abdomen 12/26/2014, chest CT 12/21/2014, CT abdomen 09/22/2014  FINDINGS: Chest:  Surgical changes of prior breast augmentation.  Asymmetric appearance of the thoracic cage again evident.  Heart size within normal limits.  No pericardial fluid/ thickening.  No hiatal hernia.  No confluent airspace disease or pneumothorax.  Abdomen/ pelvis:  Unremarkable liver.  Unremarkable spleen.  Left adrenal  gland a small. Nodule involving the medial limb of the right adrenal gland. 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  Unremarkable appearance of the bilateral kidneys.  Unremarkable appearance of the pancreas.  Unremarkable gallbladder.  No intra abdominal air. Small amount of free fluid layered within the pelvis, with low Hounsfield units.  Enteric contrast within stomach and small bowel, with contrast traversing the ileocecal valve. The colon is relatively decompressed with no adjacent inflammatory changes.  Borderline dilated small bowel loops within the mid and distal small bowel. There is a transition from dilated small bowel to decompressed small bowel just proximal to the distal ileum and ileocecal valve (image 69 of series 2). This segment of small bowel is  identical to the location of previously identified narrowing on the CT 12/26/2014. Contrast does traverse this segment.  The enteric contrast present on the current study decreases the sensitivity for detection of hyper enhancement of the mucosa an bowel wall. No associated inflammation within the mesenteric. No significantly increased blood flow within the mesenteric vessels (negative "comb sign").  Normal appendix.  Unremarkable appearance of the urinary bladder. Unremarkable appearance of the adnexa.  No evidence of acute bony abnormality. No significant degenerative changes. No sclerotic changes of the iliosacral joints. Unremarkable appearance of the proximal femurs.  IMPRESSION: Partial small bowel obstruction of the distal ileum with borderline proximal dilated small bowel loops. The transition point is located at the site of prior small bowel inflammation identified on CT 12/26/2014, compatible with developing stricture. The presence of enteric contrast on the current CT decreases the sensitivity for evaluation of the bowel wall, although the free fluid within the pelvis indicates degree of active inflammation.  Small size of  the left adrenal gland may reflect adrenal insufficiency. Correlation with a history of longstanding steroid use recommended.  Signed,  Dulcy Fanny. Earleen Newport, DO  [facility]  [facility]   Electronically Signed   By: Corrie Mckusick D.O.   On: 05/03/2015 11:48   Dg Abd 2 Views  05/09/2015   CLINICAL DATA:  Right lower quadrant pain for 6 days.  EXAM: ABDOMEN - 2 VIEW  COMPARISON:  05/04/2015  FINDINGS: Scattered large and small bowel gas is noted. A few dilated loops of small bowel are again seen with air-fluid levels. This has increased slightly in the interval from the prior exam. Fecal material is noted throughout the colon. No free air is seen. No bony abnormality is noted.  IMPRESSION: Slight increase in the degree of small bowel dilatation likely related to partial small bowel obstruction as previously diagnosed.   Electronically Signed   By: Inez Catalina M.D.   On: 05/09/2015 15:23   Dg Abd 2 Views  05/04/2015   CLINICAL DATA:  Right lower quadrant pain for 2 days  EXAM: ABDOMEN - 2 VIEW  COMPARISON:  05/03/2015  FINDINGS: Scattered large and small bowel gas is noted. Mild residual prominence of the small bowel is noted. Contrast material is noted within the colon consistent with the recent CT examination. No free air is seen.  IMPRESSION: Slight residual small bowel dilatation. The previously seen changes were likely related to a partial small bowel obstruction   Electronically Signed   By: Inez Catalina M.D.   On: 05/04/2015 14:09   Dg Abd 2 Views  04/19/2015   CLINICAL DATA:  Right lower quadrant abdominal pain for 4 days. Nausea.  EXAM: ABDOMEN - 2 VIEW  COMPARISON:  01/15/2015  FINDINGS: No evidence of bowel obstruction or generalized adynamic ileus. Mild increased stool in the colon. No free air.  No evidence of renal or ureteral stones. Soft tissues are unremarkable. No significant bony abnormality.  IMPRESSION: 1. No acute findings. No evidence of bowel  obstruction or generalized adynamic ileus. No free air. 2. Mild increased stool in the colon.   Electronically Signed   By: Lajean Manes M.D.   On: 04/19/2015 11:06   Dg Abd Acute W/chest  05/03/2015   CLINICAL DATA:  New RIGHT mid abdominal pain with nausea vomiting. History of Crohn's.  EXAM: DG ABDOMEN ACUTE W/ 1V CHEST  COMPARISON:  Abdominal radiograph Apr 19, 2015, chest radiograph February 09, 2015 and CT of the chest  December 21, 2014  FINDINGS: Cardiomediastinal silhouette is unremarkable. Similar chronic airspace opacities with scarring. No pleural effusion. No pneumothorax. Soft tissue planes and included osseous structures are nonsuspicious; bilateral breast implants.  Mildly dilated small bowel in LEFT upper quadrant of measures up to 3.5 cm with scattered air-fluid levels at varying levels, paucity of large bowel gas. No intra-abdominal mass effect or pathologic calcifications. No free air. Soft tissue planes and included osseous structures are nonsuspicious.  IMPRESSION: No acute cardiopulmonary process ; stable chronic pulmonary parenchymal changes.  Findings concerning for early small bowel obstruction. As there are varying small bowel air-fluid levels, enteritis could have this appearance though, less favored considering paucity of large bowel gas.   Electronically Signed   By: Elon Alas   On: 05/03/2015 05:40     CBC  Recent Labs Lab 05/09/15 1220 05/09/15 1811 05/10/15 0440 05/12/15 0400  WBC 10.0 8.1 5.8 10.5  HGB 14.5 13.4 11.4* 13.2  HCT 43.1 40.7 33.9* 39.3  PLT 353 354 308 329  MCV 89.6 90.6 89.9 87.5  MCH 30.1 29.8 30.2 29.4  MCHC 33.6 32.9 33.6 33.6  RDW 12.4 12.5 12.5 12.1  LYMPHSABS 0.6*  --   --   --   MONOABS 0.2  --   --   --   EOSABS 0.0  --   --   --   BASOSABS 0.0  --   --   --     Chemistries   Recent Labs Lab 05/09/15 1220 05/09/15 1811 05/10/15 0440 05/12/15 0400  NA 140  --  136 135  K 3.9  --  3.8 3.9  CL 102  --  107 101  CO2 25   --  20* 23  GLUCOSE 136*  --  105* 130*  BUN 11  --  8 <5*  CREATININE 0.79 0.66 0.58 0.40*  CALCIUM 10.3  --  8.6* 9.1  AST 20  --   --  16  ALT 20  --   --  16  ALKPHOS 41  --   --  35*  BILITOT 1.1  --   --  0.4   ------------------------------------------------------------------------------------------------------------------ estimated creatinine clearance is 88.9 mL/min (by C-G formula based on Cr of 0.4). ------------------------------------------------------------------------------------------------------------------ No results for input(s): HGBA1C in the last 72 hours. ------------------------------------------------------------------------------------------------------------------ No results for input(s): CHOL, HDL, LDLCALC, TRIG, CHOLHDL, LDLDIRECT in the last 72 hours. ------------------------------------------------------------------------------------------------------------------ No results for input(s): TSH, T4TOTAL, T3FREE, THYROIDAB in the last 72 hours.  Invalid input(s): FREET3 ------------------------------------------------------------------------------------------------------------------ No results for input(s): VITAMINB12, FOLATE, FERRITIN, TIBC, IRON, RETICCTPCT in the last 72 hours.  Coagulation profile  Recent Labs Lab 05/09/15 1811  INR 1.09    No results for input(s): DDIMER in the last 72 hours.  Cardiac Enzymes No results for input(s): CKMB, TROPONINI, MYOGLOBIN in the last 168 hours.  Invalid input(s): CK ------------------------------------------------------------------------------------------------------------------ Invalid input(s): POCBNP   Time Spent in minutes   35   Emmily Pellegrin K M.D on 05/12/2015 at 10:35 AM  Between 7am to 7pm - Pager - [phone]  After 7pm go to [email or web address] - password [facility]  [facility]   Office  [phone]

## 2015-05-13 ENCOUNTER — Ambulatory Visit: Payer: BLUE CROSS/BLUE SHIELD | Admitting: Physician Assistant

## 2015-05-13 DIAGNOSIS — R1030 Lower abdominal pain, unspecified: Secondary | ICD-10-CM

## 2015-05-13 LAB — COMPREHENSIVE METABOLIC PANEL
ALT: 16 U/L (ref 14–54)
AST: 19 U/L (ref 15–41)
Albumin: 4 g/dL (ref 3.5–5.0)
Alkaline Phosphatase: 37 U/L — ABNORMAL LOW (ref 38–126)
Anion gap: 13 (ref 5–15)
BUN: <5 mg/dL — ABNORMAL LOW (ref 6–20)
CALCIUM: 9.1 mg/dL (ref 8.9–10.3)
CHLORIDE: 104 mmol/L (ref 101–111)
CO2: 22 mmol/L (ref 22–32)
CREATININE: 0.71 mg/dL (ref 0.44–1.00)
GFR calc Af Amer: >60 mL/min (ref >60)
GFR calc non Af Amer: >60 mL/min (ref >60)
Glucose, Bld: 108 mg/dL — ABNORMAL HIGH (ref 65–99)
Potassium: 3.7 mmol/L (ref 3.5–5.1)
SODIUM: 139 mmol/L (ref 135–145)
Total Bilirubin: 0.7 mg/dL (ref 0.3–1.2)
Total Protein: 7.6 g/dL (ref 6.5–8.1)

## 2015-05-13 LAB — CBC
HCT: 39.7 % (ref 36.0–46.0)
HEMOGLOBIN: 13.5 g/dL (ref 12.0–15.0)
MCH: 30 pg (ref 26.0–34.0)
MCHC: 34 g/dL (ref 30.0–36.0)
MCV: 88.2 fL (ref 78.0–100.0)
Platelets: 353 10*3/uL (ref 150–400)
RBC: 4.5 MIL/uL (ref 3.87–5.11)
RDW: 12.4 % (ref 11.5–15.5)
WBC: 10.7 10*3/uL — AB (ref 4.0–10.5)

## 2015-05-13 LAB — MAGNESIUM: MAGNESIUM: 1.7 mg/dL (ref 1.7–2.4)

## 2015-05-13 MED ORDER — SALINE SPRAY 0.65 % NA SOLN
1.0000 | NASAL | Status: DC | PRN
  Administered 2015-05-13 – 2015-05-15 (×2): 1 via NASAL
  Filled 2015-05-13: qty 44

## 2015-05-13 MED ORDER — PHENOL 1.4 % MT LIQD
1.0000 | OROMUCOSAL | Status: DC | PRN
  Administered 2015-05-13 – 2015-05-14 (×2): 1 via OROMUCOSAL
  Filled 2015-05-13: qty 177

## 2015-05-13 MED ORDER — MAGNESIUM SULFATE IN D5W 10-5 MG/ML-% IV SOLN
1.0000 g | Freq: Once | INTRAVENOUS | Status: AC
  Administered 2015-05-13: 1 g via INTRAVENOUS
  Filled 2015-05-13: qty 100

## 2015-05-13 MED ORDER — METOPROLOL TARTRATE 1 MG/ML IV SOLN
5.0000 mg | INTRAVENOUS | Status: DC | PRN
  Administered 2015-05-14 – 2015-05-15 (×2): 5 mg via INTRAVENOUS
  Filled 2015-05-13 (×5): qty 5

## 2015-05-13 NOTE — Progress Notes (Signed)
Patient Demographics  Meghan Charles, is a 44 y.o. female, DOB - 19-May-1971, DGL:[id number]  Admit date - 05/09/2015   Admitting Physician Thurnell Lose, MD  Outpatient Primary MD for the patient is PROVIDER NOT [place]  LOS - 4   Chief Complaint  Patient presents with  . Crohn's Disease  . Abdominal Pain        Subjective:   Meghan Charles today has, No headache, No chest pain, mild upper abdominal pain - No Nausea, No new weakness tingling or numbness, No Cough - SOB.    Assessment & Plan    1. 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  She underwent Exploratory laparotomy, biopsy mesenteric nodule, segmental resection of 12 inches of mid ileum with primary anastomosis by Dr. Dalbert Batman on 05/11/2015.  Currently has postop ileus, nothing by mouth, NG in place, supportive care with pain medications and IV fluid. GI and general surgery following. On Cipro Flagyl GI monitoring duration.    2. Colonic polyps and gallbladder polyp. Defer to primary GI which is Dr. Hilarie Fredrickson and general surgery.    3. Hypertension. Secondary to pain. Pain control, as needed IV hydralazine.   4.Mild S.Tach - due to Pain and NG causing discomfort - supportive Rx.      Code Status: Full  Family Communication: None present  Disposition Plan: Likely home in 3-4 days after surgery   Consults  GI Dr. Hilarie Fredrickson and general surgery   Procedures   05/11/2015 By Dr Dalbert Batman - Exploratory laparotomy, biopsy mesenteric nodule, segmental resection of 12 inches of mid ileum with primary anastomosis   DVT Prophylaxis  Lovenox    Lab Results  Component Value Date   PLT 353 05/13/2015     Medications  Scheduled Meds: . enoxaparin (LOVENOX) injection  40 mg Subcutaneous Q24H  . fentaNYL  50 mcg Transdermal Q72H   Continuous Infusions: . 0.9 % NaCl with KCl 20 mEq / L 125 mL/hr at 05/13/15 0043   PRN Meds:.hydrALAZINE, HYDROmorphone (DILAUDID) injection, methocarbamol (ROBAXIN)  IV, ondansetron **OR** ondansetron (ZOFRAN) IV, oxyCODONE-acetaminophen, phenol, sodium chloride, zolpidem  Antibiotics     Anti-infectives    Start     Dose/Rate Route Frequency Ordered Stop   05/12/15 0800  ciprofloxacin (CIPRO) IVPB 400 mg     400 mg 200 mL/hr over 60 Minutes Intravenous Every 12 hours 05/12/15 0725 05/12/15 2125   05/12/15 0800  metroNIDAZOLE (FLAGYL) IVPB 500 mg     500 mg 100 mL/hr over 60 Minutes Intravenous Every 8 hours 05/12/15 0725 05/12/15 1615   05/09/15 1615  ciprofloxacin (CIPRO) IVPB 400 mg  Status:  Discontinued     400 mg 200 mL/hr over 60 Minutes Intravenous Every 12 hours 05/09/15 1612 05/12/15 0725   05/09/15 1615  metroNIDAZOLE (FLAGYL) IVPB 500 mg  Status:  Discontinued     500 mg 100 mL/hr over 60 Minutes Intravenous Every 8 hours 05/09/15 1612 05/12/15 0725        Objective:   Filed Vitals:   05/11/15 2148 05/12/15 0500 05/12/15 2127 05/13/15 0540  BP: 178/109 161/99 158/115 124/79  Pulse: 97 107 123 124  Temp: 98.9 F (37.2 C)  98.1 F (36.7 C) 98 F (36.7 C) 98.2 F (36.8 C)  TempSrc: Oral  Oral Oral  Resp: 16 16 18 18   Height:  5' 8"  (1.727 m)    Weight:  62.1 kg (136 lb 14.5 oz)    SpO2: 100% 95% 99% 97%    Wt Readings from Last 3 Encounters:  05/12/15 62.1 kg (136 lb 14.5 oz)  05/03/15 64.411 kg (142 lb)  04/20/15 65.409 kg (144 lb 3.2 oz)     Intake/Output Summary (Last 24 hours) at 05/13/15 1003 Last data filed at 05/13/15 0540  Gross per 24 hour  Intake     60 ml  Output   1100 ml  Net  -1040 ml     Physical Exam  Awake Alert, Oriented X 3, No new F.N deficits, Normal affect [place].AT,PERRAL Supple Neck,No  JVD, No cervical lymphadenopathy appriciated.  Symmetrical Chest wall movement, Good air movement bilaterally, CTAB RRR,No Gallops,Rubs or new Murmurs, No Parasternal Heave No bowel sounds, midline incision scar stable under bandage, NG in place, No organomegaly appriciated, No rebound - guarding or rigidity. No Cyanosis, Clubbing or edema, No new Rash or bruise      Data Review   Micro Results Recent Results (from the past 240 hour(s))  Surgical pcr screen     Status: None   Collection Time: 05/10/15  9:40 AM  Result Value Ref Range Status   MRSA, PCR NEGATIVE NEGATIVE Final   Staphylococcus aureus NEGATIVE NEGATIVE Final    Comment:        The Xpert SA Assay (FDA approved for NASAL specimens in patients over 62 years of age), is one component of a comprehensive surveillance program.  Test performance has been validated by [facility] for patients greater than or equal to 33 year old. It is not intended to diagnose infection nor to guide or monitor treatment.     Radiology Reports Ct Abdomen Pelvis W Contrast  05/03/2015   CLINICAL DATA:  44 year old female with a history of right lower quadrant pain for several days.  EXAM: CT ABDOMEN AND PELVIS WITH CONTRAST  TECHNIQUE: Multidetector CT imaging of the abdomen and pelvis was performed using the standard protocol following bolus administration of intravenous contrast.  CONTRAST:  114m OMNIPAQUE IOHEXOL 300 MG/ML  SOLN  COMPARISON:  CT abdomen 12/26/2014, chest CT 12/21/2014, CT abdomen 09/22/2014  FINDINGS: Chest:  Surgical changes of prior breast augmentation.  Asymmetric appearance of the thoracic cage again evident.  Heart size within normal limits.  No pericardial fluid/ thickening.  No hiatal hernia.  No confluent airspace disease or pneumothorax.  Abdomen/ pelvis:  Unremarkable liver.  Unremarkable spleen.  Left adrenal gland a small. Nodule involving the medial limb of the right adrenal gland. 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  Unremarkable appearance of the bilateral kidneys.  Unremarkable appearance of the pancreas.  Unremarkable gallbladder.  No intra abdominal air. Small amount of free fluid layered within the pelvis, with low Hounsfield units.  Enteric contrast within stomach and small bowel, with contrast traversing the ileocecal valve. The colon is relatively decompressed with no adjacent inflammatory changes.  Borderline dilated small bowel loops within the mid and distal small bowel. There is a transition from dilated small bowel to decompressed small bowel just proximal to the distal ileum and ileocecal valve (image 69 of series 2). This segment of small bowel is identical to the location of previously identified narrowing on the CT 12/26/2014. Contrast does traverse this  segment.  The enteric contrast present on the current study decreases the sensitivity for detection of hyper enhancement of the mucosa an bowel wall. No associated inflammation within the mesenteric. No significantly increased blood flow within the mesenteric vessels (negative "comb sign").  Normal appendix.  Unremarkable appearance of the urinary bladder. Unremarkable appearance of the adnexa.  No evidence of acute bony abnormality. No significant degenerative changes. No sclerotic changes of the iliosacral joints. Unremarkable appearance of the proximal femurs.  IMPRESSION: Partial small bowel obstruction of the distal ileum with borderline proximal dilated small bowel loops. The transition point is located at the site of prior small bowel inflammation identified on CT 12/26/2014, compatible with developing stricture. The presence of enteric contrast on the current CT decreases the sensitivity for evaluation of the bowel wall, although the free fluid within the pelvis indicates degree of active inflammation.  Small size of the left adrenal gland may reflect adrenal insufficiency. Correlation with a history of longstanding steroid  use recommended.  Signed,  Dulcy Fanny. Earleen Newport, DO  [facility]  [facility]   Electronically Signed   By: Corrie Mckusick D.O.   On: 05/03/2015 11:48   Dg Abd 2 Views  05/09/2015   CLINICAL DATA:  Right lower quadrant pain for 6 days.  EXAM: ABDOMEN - 2 VIEW  COMPARISON:  05/04/2015  FINDINGS: Scattered large and small bowel gas is noted. A few dilated loops of small bowel are again seen with air-fluid levels. This has increased slightly in the interval from the prior exam. Fecal material is noted throughout the colon. No free air is seen. No bony abnormality is noted.  IMPRESSION: Slight increase in the degree of small bowel dilatation likely related to partial small bowel obstruction as previously diagnosed.   Electronically Signed   By: Inez Catalina M.D.   On: 05/09/2015 15:23   Dg Abd 2 Views  05/04/2015   CLINICAL DATA:  Right lower quadrant pain for 2 days  EXAM: ABDOMEN - 2 VIEW  COMPARISON:  05/03/2015  FINDINGS: Scattered large and small bowel gas is noted. Mild residual prominence of the small bowel is noted. Contrast material is noted within the colon consistent with the recent CT examination. No free air is seen.  IMPRESSION: Slight residual small bowel dilatation. The previously seen changes were likely related to a partial small bowel obstruction   Electronically Signed   By: Inez Catalina M.D.   On: 05/04/2015 14:09   Dg Abd 2 Views  04/19/2015   CLINICAL DATA:  Right lower quadrant abdominal pain for 4 days. Nausea.  EXAM: ABDOMEN - 2 VIEW  COMPARISON:  01/15/2015  FINDINGS: No evidence of bowel obstruction or generalized adynamic ileus. Mild increased stool in the colon. No free air.  No evidence of renal or ureteral stones. Soft tissues are unremarkable. No significant bony abnormality.  IMPRESSION: 1. No acute findings. No evidence of bowel obstruction or generalized adynamic ileus. No free air. 2. Mild increased stool in the colon.   Electronically  Signed   By: Lajean Manes M.D.   On: 04/19/2015 11:06   Dg Abd Acute W/chest  05/03/2015   CLINICAL DATA:  New RIGHT mid abdominal pain with nausea vomiting. History of Crohn's.  EXAM: DG ABDOMEN ACUTE W/ 1V CHEST  COMPARISON:  Abdominal radiograph Apr 19, 2015, chest radiograph February 09, 2015 and CT of the chest December 21, 2014  FINDINGS: Cardiomediastinal silhouette is unremarkable. Similar chronic airspace opacities with scarring. No  pleural effusion. No pneumothorax. Soft tissue planes and included osseous structures are nonsuspicious; bilateral breast implants.  Mildly dilated small bowel in LEFT upper quadrant of measures up to 3.5 cm with scattered air-fluid levels at varying levels, paucity of large bowel gas. No intra-abdominal mass effect or pathologic calcifications. No free air. Soft tissue planes and included osseous structures are nonsuspicious.  IMPRESSION: No acute cardiopulmonary process ; stable chronic pulmonary parenchymal changes.  Findings concerning for early small bowel obstruction. As there are varying small bowel air-fluid levels, enteritis could have this appearance though, less favored considering paucity of large bowel gas.   Electronically Signed   By: Elon Alas   On: 05/03/2015 05:40     CBC  Recent Labs Lab 05/09/15 1220 05/09/15 1811 05/10/15 0440 05/12/15 0400 05/13/15 0359  WBC 10.0 8.1 5.8 10.5 10.7*  HGB 14.5 13.4 11.4* 13.2 13.5  HCT 43.1 40.7 33.9* 39.3 39.7  PLT 353 354 308 329 353  MCV 89.6 90.6 89.9 87.5 88.2  MCH 30.1 29.8 30.2 29.4 30.0  MCHC 33.6 32.9 33.6 33.6 34.0  RDW 12.4 12.5 12.5 12.1 12.4  LYMPHSABS 0.6*  --   --   --   --   MONOABS 0.2  --   --   --   --   EOSABS 0.0  --   --   --   --   BASOSABS 0.0  --   --   --   --     Chemistries   Recent Labs Lab 05/09/15 1220 05/09/15 1811 05/10/15 0440 05/12/15 0400 05/13/15 0359  NA 140  --  136 135 139  K 3.9  --  3.8 3.9 3.7  CL 102  --  107 101 104  CO2 25  --  20* 23  22  GLUCOSE 136*  --  105* 130* 108*  BUN 11  --  8 <5* <5*  CREATININE 0.79 0.66 0.58 0.40* 0.71  CALCIUM 10.3  --  8.6* 9.1 9.1  MG  --   --   --   --  1.7  AST 20  --   --  16 19  ALT 20  --   --  16 16  ALKPHOS 41  --   --  35* 37*  BILITOT 1.1  --   --  0.4 0.7   ------------------------------------------------------------------------------------------------------------------ estimated creatinine clearance is 88.9 mL/min (by C-G formula based on Cr of 0.71). ------------------------------------------------------------------------------------------------------------------ No results for input(s): HGBA1C in the last 72 hours. ------------------------------------------------------------------------------------------------------------------ No results for input(s): CHOL, HDL, LDLCALC, TRIG, CHOLHDL, LDLDIRECT in the last 72 hours. ------------------------------------------------------------------------------------------------------------------ No results for input(s): TSH, T4TOTAL, T3FREE, THYROIDAB in the last 72 hours.  Invalid input(s): FREET3 ------------------------------------------------------------------------------------------------------------------ No results for input(s): VITAMINB12, FOLATE, FERRITIN, TIBC, IRON, RETICCTPCT in the last 72 hours.  Coagulation profile  Recent Labs Lab 05/09/15 1811  INR 1.09    No results for input(s): DDIMER in the last 72 hours.  Cardiac Enzymes No results for input(s): CKMB, TROPONINI, MYOGLOBIN in the last 168 hours.  Invalid input(s): CK ------------------------------------------------------------------------------------------------------------------ Invalid input(s): POCBNP   Time Spent in minutes   35   SINGH,PRASHANT K M.D on 05/13/2015 at 10:03 AM  Between 7am to 7pm - Pager - [phone]  After 7pm go to [email or web address] - password [email or web address]  [facility]   Office  [phone]

## 2015-05-13 NOTE — Progress Notes (Signed)
    Progress Note   Subjective  Some dry heaves during the night but none this am. Pain under control. Feels relatively well. Parents visiting   Objective   Vital signs in last 24 hours: Temp:  [98 F (36.7 C)-98.2 F (36.8 C)] 98.2 F (36.8 C) (05/26 0540) Pulse Rate:  [123-124] 124 (05/26 0540) Resp:  [18] 18 (05/26 0540) BP: (124-158)/(79-115) 124/79 mmHg (05/26 0540) SpO2:  [97 %-99 %] 97 % (05/26 0540) Last BM Date: 05/08/15 General:    Pleasant white female in NAD Heart:  tachycardic Lungs: Respirations even and unlabored, lungs CTA bilaterally. NGT to wall suction Abdomen:  Soft, nondistended, hypoactive bowel sounds. Extremities:  Without edema. Neurologic:  Alert and oriented,  grossly normal neurologically. Psych:  Cooperative. Normal mood and affect.  Lab Results:  Recent Labs  05/12/15 0400 05/13/15 0359  WBC 10.5 10.7*  HGB 13.2 13.5  HCT 39.3 39.7  PLT 329 353   BMET  Recent Labs  05/12/15 0400 05/13/15 0359  NA 135 139  K 3.9 3.7  CL 101 104  CO2 23 22  GLUCOSE 130* 108*  BUN <5* <5*  CREATININE 0.40* 0.71  CALCIUM 9.1 9.1   LFT  Recent Labs  05/13/15 0359  PROT 7.6  ALBUMIN 4.0  AST 19  ALT 16  ALKPHOS 37*  BILITOT 0.7      Assessment / Plan:   48. 44 year old female with Crohn's enteritis/ small bowel obstruction secondary to stricturing. Patient is s/p exp lap and resection of 12 inches of the mid ileum with a primary anastomosis two days ago. Vomiting with ambulation yesterday, better after NGT placement. Still no BMs, no flatus and bowel sounds still hypoactive. Parents visiting, patient to ambulate with them.  Her pain is controlled.  Immunomodulator on hold, will resume once wound heals.   2. Tachycardia, asymptomatic. Etiology unclear. Not in pain, not anxious. IVF going at 137ml / hr.    LOS: 4 days   Tye Savoy  05/13/2015, 9:11 AM

## 2015-05-13 NOTE — Progress Notes (Signed)
[facility] Progress Note  2 Days Post-Op  Subjective: Pt no longer feeling as nauseous, no vomiting since NG placed.  Ambulated some, pain improved.  Not hungry or thirsty.  She says no flatus or stomach rumbling.  No BM.    Objective: Vital signs in last 24 hours: Temp:  [98 F (36.7 C)-98.2 F (36.8 C)] 98.2 F (36.8 C) (05/26 0540) Pulse Rate:  [123-124] 124 (05/26 0540) Resp:  [18] 18 (05/26 0540) BP: (124-158)/(79-115) 124/79 mmHg (05/26 0540) SpO2:  [97 %-99 %] 97 % (05/26 0540) Last BM Date: 05/08/15  Intake/Output from previous day: 05/25 0701 - 05/26 0700 In: 360 [IV Piggyback:360] Out: 1900 [Urine:1500; Emesis/NG output:400] Intake/Output this shift:    PE: Gen:  Alert, NAD, pleasant Abd: Soft, mild distension, moderate incisional pain, diminished BS, no HSM, incisions with sanguinous drainage on honeycomb dressing   Lab Results:   Recent Labs  05/12/15 0400 05/13/15 0359  WBC 10.5 10.7*  HGB 13.2 13.5  HCT 39.3 39.7  PLT 329 353   BMET  Recent Labs  05/12/15 0400 05/13/15 0359  NA 135 139  K 3.9 3.7  CL 101 104  CO2 23 22  GLUCOSE 130* 108*  BUN <5* <5*  CREATININE 0.40* 0.71  CALCIUM 9.1 9.1   PT/INR No results for input(s): LABPROT, INR in the last 72 hours. CMP     Component Value Date/Time   NA 139 05/13/2015 0359   K 3.7 05/13/2015 0359   CL 104 05/13/2015 0359   CO2 22 05/13/2015 0359   GLUCOSE 108* 05/13/2015 0359   BUN <5* 05/13/2015 0359   CREATININE 0.71 05/13/2015 0359   CALCIUM 9.1 05/13/2015 0359   PROT 7.6 05/13/2015 0359   ALBUMIN 4.0 05/13/2015 0359   AST 19 05/13/2015 0359   ALT 16 05/13/2015 0359   ALKPHOS 37* 05/13/2015 0359   BILITOT 0.7 05/13/2015 0359   GFRNONAA >60 05/13/2015 0359   GFRAA >60 05/13/2015 0359   Lipase     Component Value Date/Time   LIPASE 28 05/09/2015 1220       Studies/Results: No results found.  Anti-infectives: Anti-infectives    Start     Dose/Rate Route  Frequency Ordered Stop   05/12/15 0800  ciprofloxacin (CIPRO) IVPB 400 mg     400 mg 200 mL/hr over 60 Minutes Intravenous Every 12 hours 05/12/15 0725 05/12/15 2125   05/12/15 0800  metroNIDAZOLE (FLAGYL) IVPB 500 mg     500 mg 100 mL/hr over 60 Minutes Intravenous Every 8 hours 05/12/15 0725 05/12/15 1615   05/09/15 1615  ciprofloxacin (CIPRO) IVPB 400 mg  Status:  Discontinued     400 mg 200 mL/hr over 60 Minutes Intravenous Every 12 hours 05/09/15 1612 05/12/15 0725   05/09/15 1615  metroNIDAZOLE (FLAGYL) IVPB 500 mg  Status:  Discontinued     500 mg 100 mL/hr over 60 Minutes Intravenous Every 8 hours 05/09/15 1612 05/12/15 0725       Assessment/Plan Crohn's enteritis with partial obstruction at mid ileum, miliary granulomatous nodules of lower abdomen -POD #2 s/p Ex Lap, biopsy of mesenteric nodules, segmental resection of 12 inches of the mid ileum with primary anastamosis -Continue Cipro and Flagyl just 2 post-operative doses ordered -Holding steroids -Inserted NG yesterday secondary to significant nausea, NPO, IVF, pain control, antiemetics -Dr. Hilarie Fredrickson following for Crohn's -Ambulate and IS (up to 1250) -SCD's and lovenox -Pain control is adequate at this time, Continue robaxin and ice -NPO with ice chips  for now until bowel function returns -Remove honeycomb dressing tomorrow -Labs in AM -Pending path   LOS: 4 days    Nat Christen 05/13/2015, 8:30 AM Pager: [phone]

## 2015-05-14 LAB — CBC
HCT: 37.4 % (ref 36.0–46.0)
HEMOGLOBIN: 12.1 g/dL (ref 12.0–15.0)
MCH: 29.2 pg (ref 26.0–34.0)
MCHC: 32.4 g/dL (ref 30.0–36.0)
MCV: 90.3 fL (ref 78.0–100.0)
Platelets: 295 10*3/uL (ref 150–400)
RBC: 4.14 MIL/uL (ref 3.87–5.11)
RDW: 12.5 % (ref 11.5–15.5)
WBC: 7.7 10*3/uL (ref 4.0–10.5)

## 2015-05-14 LAB — BASIC METABOLIC PANEL
Anion gap: 10 (ref 5–15)
BUN: 7 mg/dL (ref 6–20)
CHLORIDE: 103 mmol/L (ref 101–111)
CO2: 23 mmol/L (ref 22–32)
CREATININE: 0.62 mg/dL (ref 0.44–1.00)
Calcium: 8.7 mg/dL — ABNORMAL LOW (ref 8.9–10.3)
GFR calc Af Amer: >60 mL/min (ref >60)
GFR calc non Af Amer: >60 mL/min (ref >60)
Glucose, Bld: 89 mg/dL (ref 65–99)
Potassium: 4 mmol/L (ref 3.5–5.1)
Sodium: 136 mmol/L (ref 135–145)

## 2015-05-14 LAB — MAGNESIUM: Magnesium: 1.8 mg/dL (ref 1.7–2.4)

## 2015-05-14 MED ORDER — HYDROMORPHONE HCL 1 MG/ML IJ SOLN
1.0000 mg | INTRAMUSCULAR | Status: DC | PRN
  Administered 2015-05-14 – 2015-05-16 (×9): 1 mg via INTRAVENOUS
  Filled 2015-05-14 (×9): qty 1

## 2015-05-14 MED ORDER — BISACODYL 10 MG RE SUPP
10.0000 mg | Freq: Two times a day (BID) | RECTAL | Status: AC
  Administered 2015-05-14 (×2): 10 mg via RECTAL
  Filled 2015-05-14 (×2): qty 1

## 2015-05-14 NOTE — Plan of Care (Signed)
Problem: Phase I Progression Outcomes Goal: OOB as tolerated unless otherwise ordered Outcome: Progressing Ambulating in hallway

## 2015-05-14 NOTE — Progress Notes (Signed)
Patient Demographics  Meghan Charles, is a 44 y.o. female, DOB - [birth year], MPN:[id number]  Admit date - 05/09/2015   Admitting Physician Thurnell Lose, MD  Outpatient Primary MD for the patient is PROVIDER NOT [place]  LOS - 5   Chief Complaint  Patient presents with  . Crohn's Disease  . Abdominal Pain        Subjective:   Meghan Charles today has, No headache, No chest pain, mild upper abdominal pain better than before - No Nausea, No new weakness tingling or numbness, No Cough - SOB.    Assessment & Plan    1. 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  She underwent Exploratory laparotomy, biopsy mesenteric nodule, segmental resection of 12 inches of mid ileum with primary anastomosis by Dr. Dalbert Batman on 05/11/2015.  Still has postop ileus, nothing by mouth, NG in place, supportive care with pain medications and IV fluid. GI and general surgery following.No ABX now per GI and CCS.    2. Colonic polyps and gallbladder polyp. Defer to primary GI which is Dr. Hilarie Fredrickson and general surgery.   3. Hypertension. Secondary to pain. Pain control, as needed IV hydralazine.   4.Mild S.Tach - due to Pain and NG causing discomfort - supportive Rx.      Code Status: Full  Family Communication: None present  Disposition Plan: Likely home in 3-4 days after surgery   Consults  GI Dr. Hilarie Fredrickson and general surgery   Procedures   05/11/2015 By Dr Dalbert Batman - Exploratory laparotomy, biopsy mesenteric nodule, segmental resection of 12 inches of mid ileum with primary anastomosis   DVT Prophylaxis  Lovenox    Lab Results  Component Value Date   PLT 295 05/14/2015     Medications  Scheduled Meds: . bisacodyl  10 mg Rectal BID  . enoxaparin (LOVENOX) injection  40 mg Subcutaneous Q24H  . fentaNYL  50 mcg Transdermal Q72H   Continuous Infusions: . 0.9 % NaCl with KCl 20 mEq / L 125 mL/hr at 05/13/15 1851   PRN Meds:.hydrALAZINE, HYDROmorphone (DILAUDID) injection, methocarbamol (ROBAXIN)  IV, metoprolol, ondansetron **OR** ondansetron (ZOFRAN) IV, oxyCODONE-acetaminophen, phenol, sodium chloride, zolpidem  Antibiotics     Anti-infectives    Start     Dose/Rate Route Frequency Ordered Stop   05/12/15 0800  ciprofloxacin (CIPRO) IVPB 400 mg     400 mg 200 mL/hr over 60 Minutes Intravenous Every 12 hours 05/12/15 0725 05/12/15 2125   05/12/15 0800  metroNIDAZOLE (FLAGYL) IVPB 500 mg     500 mg 100 mL/hr over 60 Minutes Intravenous Every 8 hours 05/12/15 0725 05/12/15 1615   05/09/15 1615  ciprofloxacin (CIPRO) IVPB 400 mg  Status:  Discontinued     400 mg 200 mL/hr over 60 Minutes Intravenous Every 12 hours 05/09/15 1612 05/12/15 0725   05/09/15 1615  metroNIDAZOLE (FLAGYL) IVPB 500 mg  Status:  Discontinued     500 mg 100 mL/hr over 60 Minutes Intravenous Every 8 hours 05/09/15 1612 05/12/15 0725        Objective:   Filed Vitals:   05/13/15 0540 05/13/15 1430 05/13/15 2028 05/14/15 0452  BP: 124/79 122/80 127/92 135/98  Pulse: 124 102 117 125  Temp: 98.2 F (36.8 C) 98.1 F (36.7 C) 98.6 F (37 C) 97.6 F (36.4 C)  TempSrc: Oral Oral Oral Oral  Resp: 18 18 18 18   Height:      Weight:    62.143 kg (137 lb)  SpO2: 97% 97% 98% 99%    Wt Readings from Last 3 Encounters:  05/14/15 62.143 kg (137 lb)  05/03/15 64.411 kg (142 lb)  04/20/15 65.409 kg (144 lb 3.2 oz)     Intake/Output Summary (Last 24 hours) at 05/14/15 0901 Last data filed at 05/14/15 0501  Gross per 24 hour  Intake   1375 ml  Output   1200 ml  Net    175 ml     Physical Exam  Awake Alert, Oriented X 3, No new F.N deficits, Normal  affect [place].AT,PERRAL Supple Neck,No JVD, No cervical lymphadenopathy appriciated.  Symmetrical Chest wall movement, Good air movement bilaterally, CTAB RRR,No Gallops,Rubs or new Murmurs, No Parasternal Heave No bowel sounds, midline incision scar stable under bandage, NG in place, No organomegaly appriciated, No rebound - guarding or rigidity. No Cyanosis, Clubbing or edema, No new Rash or bruise      Data Review   Micro Results Recent Results (from the past 240 hour(s))  Surgical pcr screen     Status: None   Collection Time: 05/10/15  9:40 AM  Result Value Ref Range Status   MRSA, PCR NEGATIVE NEGATIVE Final   Staphylococcus aureus NEGATIVE NEGATIVE Final    Comment:        The Xpert SA Assay (FDA approved for NASAL specimens in patients over 32 years of age), is one component of a comprehensive surveillance program.  Test performance has been validated by [facility] for patients greater than or equal to 57 year old. It is not intended to diagnose infection nor to guide or monitor treatment.     Radiology Reports Ct Abdomen Pelvis W Contrast  05/03/2015   CLINICAL DATA:  44 year old female with a history of right lower quadrant pain for several days.  EXAM: CT ABDOMEN AND PELVIS WITH CONTRAST  TECHNIQUE: Multidetector CT imaging of the abdomen and pelvis was performed using the standard protocol following bolus administration of intravenous contrast.  CONTRAST:  168m OMNIPAQUE IOHEXOL 300 MG/ML  SOLN  COMPARISON:  CT abdomen 12/26/2014, chest CT 12/21/2014, CT abdomen 09/22/2014  FINDINGS: Chest:  Surgical changes of prior breast augmentation.  Asymmetric appearance of the thoracic cage again evident.  Heart size within normal limits.  No pericardial fluid/ thickening.  No hiatal hernia.  No confluent airspace disease or pneumothorax.  Abdomen/ pelvis:  Unremarkable liver.  Unremarkable spleen.  Left adrenal gland a small. Nodule involving the medial limb of the right adrenal  gland. 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  Unremarkable appearance of the bilateral kidneys.  Unremarkable appearance of the pancreas.  Unremarkable gallbladder.  No intra abdominal air. Small amount of free fluid layered within the pelvis, with low Hounsfield units.  Enteric contrast within stomach and small bowel, with contrast traversing the ileocecal valve. The colon is relatively decompressed with no adjacent inflammatory changes.  Borderline dilated small bowel loops within the mid and distal small bowel. There is a transition from dilated small bowel to decompressed small bowel just proximal to the distal ileum and ileocecal valve (image 69 of series 2). This segment of small bowel is identical to the location of previously identified narrowing on the CT 12/26/2014.  Contrast does traverse this segment.  The enteric contrast present on the current study decreases the sensitivity for detection of hyper enhancement of the mucosa an bowel wall. No associated inflammation within the mesenteric. No significantly increased blood flow within the mesenteric vessels (negative "comb sign").  Normal appendix.  Unremarkable appearance of the urinary bladder. Unremarkable appearance of the adnexa.  No evidence of acute bony abnormality. No significant degenerative changes. No sclerotic changes of the iliosacral joints. Unremarkable appearance of the proximal femurs.  IMPRESSION: Partial small bowel obstruction of the distal ileum with borderline proximal dilated small bowel loops. The transition point is located at the site of prior small bowel inflammation identified on CT 12/26/2014, compatible with developing stricture. The presence of enteric contrast on the current CT decreases the sensitivity for evaluation of the bowel wall, although the free fluid within the pelvis indicates degree of active inflammation.  Small size of the left adrenal gland may reflect adrenal insufficiency. Correlation  with a history of longstanding steroid use recommended.  Signed,  Dulcy Fanny. Earleen Newport, DO  [facility]  [facility]   Electronically Signed   By: Corrie Mckusick D.O.   On: 05/03/2015 11:48   Dg Abd 2 Views  05/09/2015   CLINICAL DATA:  Right lower quadrant pain for 6 days.  EXAM: ABDOMEN - 2 VIEW  COMPARISON:  05/04/2015  FINDINGS: Scattered large and small bowel gas is noted. A few dilated loops of small bowel are again seen with air-fluid levels. This has increased slightly in the interval from the prior exam. Fecal material is noted throughout the colon. No free air is seen. No bony abnormality is noted.  IMPRESSION: Slight increase in the degree of small bowel dilatation likely related to partial small bowel obstruction as previously diagnosed.   Electronically Signed   By: Inez Catalina M.D.   On: 05/09/2015 15:23   Dg Abd 2 Views  05/04/2015   CLINICAL DATA:  Right lower quadrant pain for 2 days  EXAM: ABDOMEN - 2 VIEW  COMPARISON:  05/03/2015  FINDINGS: Scattered large and small bowel gas is noted. Mild residual prominence of the small bowel is noted. Contrast material is noted within the colon consistent with the recent CT examination. No free air is seen.  IMPRESSION: Slight residual small bowel dilatation. The previously seen changes were likely related to a partial small bowel obstruction   Electronically Signed   By: Inez Catalina M.D.   On: 05/04/2015 14:09   Dg Abd 2 Views  04/19/2015   CLINICAL DATA:  Right lower quadrant abdominal pain for 4 days. Nausea.  EXAM: ABDOMEN - 2 VIEW  COMPARISON:  01/15/2015  FINDINGS: No evidence of bowel obstruction or generalized adynamic ileus. Mild increased stool in the colon. No free air.  No evidence of renal or ureteral stones. Soft tissues are unremarkable. No significant bony abnormality.  IMPRESSION: 1. No acute findings. No evidence of bowel obstruction or generalized adynamic ileus. No free air. 2. Mild  increased stool in the colon.   Electronically Signed   By: Lajean Manes M.D.   On: 04/19/2015 11:06   Dg Abd Acute W/chest  05/03/2015   CLINICAL DATA:  New RIGHT mid abdominal pain with nausea vomiting. History of Crohn's.  EXAM: DG ABDOMEN ACUTE W/ 1V CHEST  COMPARISON:  Abdominal radiograph Apr 19, 2015, chest radiograph February 09, 2015 and CT of the chest December 21, 2014  FINDINGS: Cardiomediastinal silhouette is unremarkable. Similar chronic airspace  opacities with scarring. No pleural effusion. No pneumothorax. Soft tissue planes and included osseous structures are nonsuspicious; bilateral breast implants.  Mildly dilated small bowel in LEFT upper quadrant of measures up to 3.5 cm with scattered air-fluid levels at varying levels, paucity of large bowel gas. No intra-abdominal mass effect or pathologic calcifications. No free air. Soft tissue planes and included osseous structures are nonsuspicious.  IMPRESSION: No acute cardiopulmonary process ; stable chronic pulmonary parenchymal changes.  Findings concerning for early small bowel obstruction. As there are varying small bowel air-fluid levels, enteritis could have this appearance though, less favored considering paucity of large bowel gas.   Electronically Signed   By: Elon Alas   On: 05/03/2015 05:40     CBC  Recent Labs Lab 05/09/15 1220 05/09/15 1811 05/10/15 0440 05/12/15 0400 05/13/15 0359 05/14/15 0515  WBC 10.0 8.1 5.8 10.5 10.7* 7.7  HGB 14.5 13.4 11.4* 13.2 13.5 12.1  HCT 43.1 40.7 33.9* 39.3 39.7 37.4  PLT 353 354 308 329 353 295  MCV 89.6 90.6 89.9 87.5 88.2 90.3  MCH 30.1 29.8 30.2 29.4 30.0 29.2  MCHC 33.6 32.9 33.6 33.6 34.0 32.4  RDW 12.4 12.5 12.5 12.1 12.4 12.5  LYMPHSABS 0.6*  --   --   --   --   --   MONOABS 0.2  --   --   --   --   --   EOSABS 0.0  --   --   --   --   --   BASOSABS 0.0  --   --   --   --   --     Chemistries   Recent Labs Lab 05/09/15 1220 05/09/15 1811 05/10/15 0440  05/12/15 0400 05/13/15 0359 05/14/15 0515  NA 140  --  136 135 139 136  K 3.9  --  3.8 3.9 3.7 4.0  CL 102  --  107 101 104 103  CO2 25  --  20* 23 22 23   GLUCOSE 136*  --  105* 130* 108* 89  BUN 11  --  8 <5* <5* 7  CREATININE 0.79 0.66 0.58 0.40* 0.71 0.62  CALCIUM 10.3  --  8.6* 9.1 9.1 8.7*  MG  --   --   --   --  1.7 1.8  AST 20  --   --  16 19  --   ALT 20  --   --  16 16  --   ALKPHOS 41  --   --  35* 37*  --   BILITOT 1.1  --   --  0.4 0.7  --    ------------------------------------------------------------------------------------------------------------------ estimated creatinine clearance is 88.9 mL/min (by C-G formula based on Cr of 0.62). ------------------------------------------------------------------------------------------------------------------ No results for input(s): HGBA1C in the last 72 hours. ------------------------------------------------------------------------------------------------------------------ No results for input(s): CHOL, HDL, LDLCALC, TRIG, CHOLHDL, LDLDIRECT in the last 72 hours. ------------------------------------------------------------------------------------------------------------------ No results for input(s): TSH, T4TOTAL, T3FREE, THYROIDAB in the last 72 hours.  Invalid input(s): FREET3 ------------------------------------------------------------------------------------------------------------------ No results for input(s): VITAMINB12, FOLATE, FERRITIN, TIBC, IRON, RETICCTPCT in the last 72 hours.  Coagulation profile  Recent Labs Lab 05/09/15 1811  INR 1.09    No results for input(s): DDIMER in the last 72 hours.  Cardiac Enzymes No results for input(s): CKMB, TROPONINI, MYOGLOBIN in the last 168 hours.  Invalid input(s): CK ------------------------------------------------------------------------------------------------------------------ Invalid input(s): POCBNP   Time Spent in minutes   35   Lala Lund K M.D on  05/14/2015 at 9:01 AM  Between  7am to 7pm - Pager - [phone]  After 7pm go to [email or web address] - password [facility]  [facility]   Office  [phone]

## 2015-05-14 NOTE — Progress Notes (Signed)
3 Days Post-Op  Subjective: Stable and alert. Ambulating in halls. Pain control adequate. NG output low. No stool or flatus yet Potassium 4.0. Creatinine 0.62. WBC 7700. Hemoglobin 12.1.  Pathology shows extensive granulomatous change in small bowel and in lymph nodes. No malignancy. Atypical for Crohn's disease but still consistent, according to Claudette Laws. Dr. Saralyn Pilar raises the question of sarcoidosis. I discussed this with the patient. Will last Dr. Vena Rua opinion as well.  Objective: Vital signs in last 24 hours: Temp:  [97.6 F (36.4 C)-98.6 F (37 C)] 97.6 F (36.4 C) (05/27 0452) Pulse Rate:  [102-125] 125 (05/27 0452) Resp:  [18] 18 (05/27 0452) BP: (122-135)/(80-98) 135/98 mmHg (05/27 0452) SpO2:  [97 %-99 %] 99 % (05/27 0452) Weight:  [62.143 kg (137 lb)] 62.143 kg (137 lb) (05/27 0452) Last BM Date: 05/08/15  Intake/Output from previous day: 05/26 0701 - 05/27 0700 In: 1375 [I.V.:1375] Out: 1200 [Urine:800; Emesis/NG output:400] Intake/Output this shift: Total I/O In: -  Out: 800 [Urine:800]  General appearance: Alert. Cooperative. Doesn't appear in distress. Resp: clear to auscultation bilaterally GI: Softer. Wound clean. Doesn't appear distended. She has some bowel sounds now.  Lab Results:  Results for orders placed or performed during the hospital encounter of 05/09/15 (from the past 24 hour(s))  CBC     Status: None   Collection Time: 05/14/15  5:15 AM  Result Value Ref Range   WBC 7.7 4.0 - 10.5 K/uL   RBC 4.14 3.87 - 5.11 MIL/uL   Hemoglobin 12.1 12.0 - 15.0 g/dL   HCT 37.4 36.0 - 46.0 %   MCV 90.3 78.0 - 100.0 fL   MCH 29.2 26.0 - 34.0 pg   MCHC 32.4 30.0 - 36.0 g/dL   RDW 12.5 11.5 - 15.5 %   Platelets 295 150 - 400 K/uL  Basic metabolic panel     Status: Abnormal   Collection Time: 05/14/15  5:15 AM  Result Value Ref Range   Sodium 136 135 - 145 mmol/L   Potassium 4.0 3.5 - 5.1 mmol/L   Chloride 103 101 - 111 mmol/L   CO2 23 22 - 32  mmol/L   Glucose, Bld 89 65 - 99 mg/dL   BUN 7 6 - 20 mg/dL   Creatinine, Ser 0.62 0.44 - 1.00 mg/dL   Calcium 8.7 (L) 8.9 - 10.3 mg/dL   GFR calc non Af Amer >60 >60 mL/min   GFR calc Af Amer >60 >60 mL/min   Anion gap 10 5 - 15  Magnesium     Status: None   Collection Time: 05/14/15  5:15 AM  Result Value Ref Range   Magnesium 1.8 1.7 - 2.4 mg/dL     Studies/Results: No results found.  . bisacodyl  10 mg Rectal BID  . enoxaparin (LOVENOX) injection  40 mg Subcutaneous Q24H  . fentaNYL  50 mcg Transdermal Q72H     Assessment/Plan: s/p Procedure(s): Laparotomy with resection of Chron's Disease small bowel resection biopsy mesenteric nodule  POD #3. Small bowel resection for Crohn's disease with primary anastomosis. Pathology reveals intense granulomatous change as discussed above. No malignancy Hopefully, ileus is resolving. We'll give Dulcolax suppositories. Discontinue NG was she starts passing flatus. Remove honeycomb dressing today. Continue ambulation. SCDs and Lovenox    @PROBHOSP @  LOS: 5 days    Mayana Irigoyen M 05/14/2015  . .prob

## 2015-05-14 NOTE — Progress Notes (Signed)
    Progress Note   Subjective  feeks better today. No flatus   Objective   Vital signs in last 24 hours: Temp:  [97.6 F (36.4 C)-98.6 F (37 C)] 97.6 F (36.4 C) (05/27 0452) Pulse Rate:  [102-125] 125 (05/27 0452) Resp:  [18] 18 (05/27 0452) BP: (122-135)/(80-98) 135/98 mmHg (05/27 0452) SpO2:  [97 %-99 %] 99 % (05/27 0452) Weight:  [137 lb (62.143 kg)] 137 lb (62.143 kg) (05/27 0452) Last BM Date: 05/08/15 General:    white female in NAD Heart:  Tachycardic, regular rhythm  Lungs: Respirations even and unlabored, lungs CTA bilaterally Abdomen:  Soft, nontender and nondistended. Normal bowel sounds. Extremities:  Without edema. Neurologic:  Alert and oriented,  grossly normal neurologically. Psych:  Cooperative. Normal mood and affect.  Intake/Output from previous day: 05/26 0701 - 05/27 0700 In: 1375 [I.V.:1375] Out: 1200 [Urine:800; Emesis/NG output:400] Intake/Output this shift:    Lab Results:  Recent Labs  05/12/15 0400 05/13/15 0359 05/14/15 0515  WBC 10.5 10.7* 7.7  HGB 13.2 13.5 12.1  HCT 39.3 39.7 37.4  PLT 329 353 295   BMET  Recent Labs  05/12/15 0400 05/13/15 0359 05/14/15 0515  NA 135 139 136  K 3.9 3.7 4.0  CL 101 104 103  CO2 23 22 23   GLUCOSE 130* 108* 89  BUN <5* <5* 7  CREATININE 0.40* 0.71 0.62  CALCIUM 9.1 9.1 8.7*   LFT  Recent Labs  05/13/15 0359  PROT 7.6  ALBUMIN 4.0  AST 19  ALT 16  ALKPHOS 37*  BILITOT 0.7     Assessment / Plan:    83. 44 year old female with Crohn's enteritis/ small bowel obstruction secondary to stricturing. Patient is s/p exp lap and resection of 12 inches of the mid ileum with a primary anastomosis three days ago. Path c/w extensive granulomatous disease / fibrosis. Currently with post-op ileus. Still has NGT but feels better (and looks better today). Only 400 NGT output yesterday but still no flatus / BM. Following along.  2. Tachycardia, asymptomatic. Etiology unclear. Not in pain, not  anxious. No signs or volume depletion.  IVF going at 125m / hr.     LOS: 5 days   PTye Savoy 05/14/2015, 9:19 AM

## 2015-05-15 DIAGNOSIS — K913 Postprocedural intestinal obstruction: Secondary | ICD-10-CM

## 2015-05-15 DIAGNOSIS — K9189 Other postprocedural complications and disorders of digestive system: Secondary | ICD-10-CM

## 2015-05-15 DIAGNOSIS — K567 Ileus, unspecified: Secondary | ICD-10-CM | POA: Insufficient documentation

## 2015-05-15 MED ORDER — POTASSIUM CHLORIDE IN NACL 20-0.9 MEQ/L-% IV SOLN
INTRAVENOUS | Status: AC
  Administered 2015-05-15 – 2015-05-16 (×4): via INTRAVENOUS
  Filled 2015-05-15 (×4): qty 1000

## 2015-05-15 NOTE — Progress Notes (Signed)
Patient ID: Meghan Charles, female   DOB: Apr 08, 1971, 44 y.o.   MRN: [id number] 4 Days Post-Op  Subjective: No complaints this morning. She did have a small bowel movement with suppository but no other flatus or bowel movements. Has had fairly high NG output. Pain well controlled.  Objective: Vital signs in last 24 hours: Temp:  [98.1 F (36.7 C)-98.4 F (36.9 C)] 98.1 F (36.7 C) (05/28 0541) Pulse Rate:  [97-111] 102 (05/28 0649) Resp:  [18] 18 (05/28 0541) BP: (129-144)/(90-98) 129/90 mmHg (05/28 0541) SpO2:  [98 %-99 %] 99 % (05/28 0541) Weight:  [61.78 kg (136 lb 3.2 oz)] 61.78 kg (136 lb 3.2 oz) (05/28 0541) Last BM Date: 05/14/15 (no bm since prior to surgery -reports small amount of stool )  Intake/Output from previous day: 05/27 0701 - 05/28 0700 In: 3701 [P.O.:460; I.V.:3241] Out: 2150 [Urine:475; Emesis/NG output:1675] Intake/Output this shift:    General appearance: alert, cooperative and no distress GI: mild appropriate tenderness. No apparent distention. Incision/Wound: dressing clean and dry  Lab Results:   Recent Labs  05/13/15 0359 05/14/15 0515  WBC 10.7* 7.7  HGB 13.5 12.1  HCT 39.7 37.4  PLT 353 295   BMET  Recent Labs  05/13/15 0359 05/14/15 0515  NA 139 136  K 3.7 4.0  CL 104 103  CO2 22 23  GLUCOSE 108* 89  BUN <5* 7  CREATININE 0.71 0.62  CALCIUM 9.1 8.7*     Studies/Results: No results found.  Anti-infectives: Anti-infectives    Start     Dose/Rate Route Frequency Ordered Stop   05/12/15 0800  ciprofloxacin (CIPRO) IVPB 400 mg     400 mg 200 mL/hr over 60 Minutes Intravenous Every 12 hours 05/12/15 0725 05/12/15 2125   05/12/15 0800  metroNIDAZOLE (FLAGYL) IVPB 500 mg     500 mg 100 mL/hr over 60 Minutes Intravenous Every 8 hours 05/12/15 0725 05/12/15 1615   05/09/15 1615  ciprofloxacin (CIPRO) IVPB 400 mg  Status:  Discontinued     400 mg 200 mL/hr over 60 Minutes Intravenous Every 12 hours 05/09/15 1612 05/12/15 0725    05/09/15 1615  metroNIDAZOLE (FLAGYL) IVPB 500 mg  Status:  Discontinued     500 mg 100 mL/hr over 60 Minutes Intravenous Every 8 hours 05/09/15 1612 05/12/15 0725      Assessment/Plan: s/p Procedure(s): Laparotomy with resection of Chron's Disease small bowel resection biopsy mesenteric nodule Postoperative ileus. Did have a small bowel movement but still high NG output. Try NG clamped today. Ambulation encouraged. Otherwise doing well.   LOS: 6 days    Meghan Charles 05/15/2015

## 2015-05-15 NOTE — Plan of Care (Signed)
Problem: Phase III Progression Outcomes Goal: Activity at appropriate level-compared to baseline (UP IN CHAIR FOR HEMODIALYSIS)  Outcome: Progressing Ambulated in hallway 3 laps

## 2015-05-15 NOTE — Progress Notes (Signed)
Patient Demographics  Meghan Charles, is a 44 y.o. female, DOB - 19-Jul-1971, UOR:[id number]  Admit date - 05/09/2015   Admitting Physician Thurnell Lose, MD  Outpatient Primary MD for the patient is PROVIDER NOT IN SYSTEM  LOS - 6   Chief Complaint  Patient presents with  . Crohn's Disease  . Abdominal Pain      Summary  This is a pleasant 44 year old Caucasian female with recently diagnosed Crohn's colitis, under the treatment of Dr. Elmo Putt for the last 1 year. Would been diagnosed with ileal stricture few weeks ago, was due to have outpatient surgical resection however her symptoms got worse and was admitted for nausea vomiting and abdominal pain due to Crohn's colitis induced ileal stricture.  He was seen by GI and general surgery, she underwent ileal resection by general surgeon Dr. Dalbert Batman on 05/11/2015. Both GI and general surgery following. Her bowel activity is gradually improving. She still has NG tube. Await bowel function to improve then could be discharged in the next 1-2 days.   Subjective:   Meghan Charles today has, No headache, No chest pain, mild upper abdominal pain better than before, has passed some flatus, still has NG - No Nausea, No new weakness tingling or numbness, No Cough - SOB.    Assessment & Plan    1. 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  She underwent Exploratory laparotomy, biopsy mesenteric nodule, segmental resection of 12 inches of mid ileum with primary anastomosis by Dr. Dalbert Batman on 05/11/2015.  Still has postop ileus although has passed some flatus after a suppository on 05/14/2015 given by surgery, is having ice chips by mouth, still has NG in  place, continue supportive care with pain medications and IV fluid. GI and general surgery following. No ABX now per GI and CCS. for management of this problem to general surgery and GI.   2. Colonic polyps and gallbladder polyp. Defer to primary GI which is Dr. Hilarie Fredrickson and general surgery.   3. Hypertension. Secondary to pain. Pain control, as needed IV hydralazine.   4. Mild S.Tach - due to Pain and NG causing discomfort - supportive Rx.      Code Status: Full  Family Communication: None present  Disposition Plan: Likely home in 3-4 days after surgery   Consults  GI Dr. Hilarie Fredrickson and general surgery   Procedures   05/11/2015 By Dr Dalbert Batman - Exploratory laparotomy, biopsy mesenteric nodule, segmental resection of 12 inches of mid ileum with primary anastomosis   DVT Prophylaxis  Lovenox    Lab Results  Component Value Date   PLT 295 05/14/2015    Medications  Scheduled Meds: . enoxaparin (LOVENOX) injection  40 mg Subcutaneous Q24H  . fentaNYL  50 mcg Transdermal Q72H   Continuous Infusions: . 0.9 % NaCl with KCl 20 mEq / L 125 mL/hr at 05/15/15 0304   PRN Meds:.hydrALAZINE, HYDROmorphone (DILAUDID) injection, methocarbamol (ROBAXIN)  IV, metoprolol, ondansetron **OR** ondansetron (ZOFRAN) IV, oxyCODONE-acetaminophen, phenol, sodium chloride, zolpidem  Antibiotics     Anti-infectives    Start     Dose/Rate Route Frequency Ordered Stop   05/12/15 0800  ciprofloxacin (CIPRO) IVPB 400 mg  400 mg 200 mL/hr over 60 Minutes Intravenous Every 12 hours 05/12/15 0725 05/12/15 2125   05/12/15 0800  metroNIDAZOLE (FLAGYL) IVPB 500 mg     500 mg 100 mL/hr over 60 Minutes Intravenous Every 8 hours 05/12/15 0725 05/12/15 1615   05/09/15 1615  ciprofloxacin (CIPRO) IVPB 400 mg  Status:  Discontinued     400 mg 200 mL/hr over 60 Minutes Intravenous Every 12 hours 05/09/15 1612 05/12/15 0725   05/09/15 1615  metroNIDAZOLE (FLAGYL) IVPB 500 mg  Status:  Discontinued     500  mg 100 mL/hr over 60 Minutes Intravenous Every 8 hours 05/09/15 1612 05/12/15 0725        Objective:   Filed Vitals:   05/14/15 2240 05/14/15 2315 05/15/15 0541 05/15/15 0649  BP: 138/97  129/90   Pulse: 106 102 111 102  Temp: 98.1 F (36.7 C)  98.1 F (36.7 C)   TempSrc: Oral  Oral   Resp: 18  18   Height:      Weight:   61.78 kg (136 lb 3.2 oz)   SpO2: 98%  99%     Wt Readings from Last 3 Encounters:  05/15/15 61.78 kg (136 lb 3.2 oz)  05/03/15 64.411 kg (142 lb)  04/20/15 65.409 kg (144 lb 3.2 oz)     Intake/Output Summary (Last 24 hours) at 05/15/15 0941 Last data filed at 05/15/15 0650  Gross per 24 hour  Intake   3701 ml  Output   2150 ml  Net   1551 ml     Physical Exam  Awake Alert, Oriented X 3, No new F.N deficits, Normal affect [place].AT,PERRAL Supple Neck,No JVD, No cervical lymphadenopathy appriciated.  Symmetrical Chest wall movement, Good air movement bilaterally, CTAB RRR,No Gallops,Rubs or new Murmurs, No Parasternal Heave Hypoactive but +ve bowel sounds, midline incision scar stable under bandage, NG in place, No organomegaly appriciated, No rebound - guarding or rigidity. No Cyanosis, Clubbing or edema, No new Rash or bruise      Data Review   Micro Results Recent Results (from the past 240 hour(s))  Surgical pcr screen     Status: None   Collection Time: 05/10/15  9:40 AM  Result Value Ref Range Status   MRSA, PCR NEGATIVE NEGATIVE Final   Staphylococcus aureus NEGATIVE NEGATIVE Final    Comment:        The Xpert SA Assay (FDA approved for NASAL specimens in patients over 38 years of age), is one component of a comprehensive surveillance program.  Test performance has been validated by [facility] for patients greater than or equal to 24 year old. It is not intended to diagnose infection nor to guide or monitor treatment.     Radiology Reports Ct Abdomen Pelvis W Contrast  05/03/2015   CLINICAL DATA:  44 year old female with a  history of right lower quadrant pain for several days.  EXAM: CT ABDOMEN AND PELVIS WITH CONTRAST  TECHNIQUE: Multidetector CT imaging of the abdomen and pelvis was performed using the standard protocol following bolus administration of intravenous contrast.  CONTRAST:  117m OMNIPAQUE IOHEXOL 300 MG/ML  SOLN  COMPARISON:  CT abdomen 12/26/2014, chest CT 12/21/2014, CT abdomen 09/22/2014  FINDINGS: Chest:  Surgical changes of prior breast augmentation.  Asymmetric appearance of the thoracic cage again evident.  Heart size within normal limits.  No pericardial fluid/ thickening.  No hiatal hernia.  No confluent airspace disease or pneumothorax.  Abdomen/ pelvis:  Unremarkable liver.  Unremarkable spleen.  Left  adrenal gland a small. Nodule involving the medial limb of the right adrenal gland. 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  Unremarkable appearance of the bilateral kidneys.  Unremarkable appearance of the pancreas.  Unremarkable gallbladder.  No intra abdominal air. Small amount of free fluid layered within the pelvis, with low Hounsfield units.  Enteric contrast within stomach and small bowel, with contrast traversing the ileocecal valve. The colon is relatively decompressed with no adjacent inflammatory changes.  Borderline dilated small bowel loops within the mid and distal small bowel. There is a transition from dilated small bowel to decompressed small bowel just proximal to the distal ileum and ileocecal valve (image 69 of series 2). This segment of small bowel is identical to the location of previously identified narrowing on the CT 12/26/2014. Contrast does traverse this segment.  The enteric contrast present on the current study decreases the sensitivity for detection of hyper enhancement of the mucosa an bowel wall. No associated inflammation within the mesenteric. No significantly increased blood flow within the mesenteric vessels (negative "comb sign").  Normal appendix.   Unremarkable appearance of the urinary bladder. Unremarkable appearance of the adnexa.  No evidence of acute bony abnormality. No significant degenerative changes. No sclerotic changes of the iliosacral joints. Unremarkable appearance of the proximal femurs.  IMPRESSION: Partial small bowel obstruction of the distal ileum with borderline proximal dilated small bowel loops. The transition point is located at the site of prior small bowel inflammation identified on CT 12/26/2014, compatible with developing stricture. The presence of enteric contrast on the current CT decreases the sensitivity for evaluation of the bowel wall, although the free fluid within the pelvis indicates degree of active inflammation.  Small size of the left adrenal gland may reflect adrenal insufficiency. Correlation with a history of longstanding steroid use recommended.  Signed,  Dulcy Fanny. Earleen Newport, DO  [facility]  [facility]   Electronically Signed   By: Corrie Mckusick D.O.   On: 05/03/2015 11:48   Dg Abd 2 Views  05/09/2015   CLINICAL DATA:  Right lower quadrant pain for 6 days.  EXAM: ABDOMEN - 2 VIEW  COMPARISON:  05/04/2015  FINDINGS: Scattered large and small bowel gas is noted. A few dilated loops of small bowel are again seen with air-fluid levels. This has increased slightly in the interval from the prior exam. Fecal material is noted throughout the colon. No free air is seen. No bony abnormality is noted.  IMPRESSION: Slight increase in the degree of small bowel dilatation likely related to partial small bowel obstruction as previously diagnosed.   Electronically Signed   By: Inez Catalina M.D.   On: 05/09/2015 15:23   Dg Abd 2 Views  05/04/2015   CLINICAL DATA:  Right lower quadrant pain for 2 days  EXAM: ABDOMEN - 2 VIEW  COMPARISON:  05/03/2015  FINDINGS: Scattered large and small bowel gas is noted. Mild residual prominence of the small bowel is noted. Contrast material is noted  within the colon consistent with the recent CT examination. No free air is seen.  IMPRESSION: Slight residual small bowel dilatation. The previously seen changes were likely related to a partial small bowel obstruction   Electronically Signed   By: Inez Catalina M.D.   On: 05/04/2015 14:09   Dg Abd 2 Views  04/19/2015   CLINICAL DATA:  Right lower quadrant abdominal pain for 4 days. Nausea.  EXAM: ABDOMEN - 2 VIEW  COMPARISON:  01/15/2015  FINDINGS: No evidence of bowel obstruction or generalized adynamic ileus. Mild increased stool in the colon. No free air.  No evidence of renal or ureteral stones. Soft tissues are unremarkable. No significant bony abnormality.  IMPRESSION: 1. No acute findings. No evidence of bowel obstruction or generalized adynamic ileus. No free air. 2. Mild increased stool in the colon.   Electronically Signed   By: Lajean Manes M.D.   On: 04/19/2015 11:06   Dg Abd Acute W/chest  05/03/2015   CLINICAL DATA:  New RIGHT mid abdominal pain with nausea vomiting. History of Crohn's.  EXAM: DG ABDOMEN ACUTE W/ 1V CHEST  COMPARISON:  Abdominal radiograph Apr 19, 2015, chest radiograph February 09, 2015 and CT of the chest December 21, 2014  FINDINGS: Cardiomediastinal silhouette is unremarkable. Similar chronic airspace opacities with scarring. No pleural effusion. No pneumothorax. Soft tissue planes and included osseous structures are nonsuspicious; bilateral breast implants.  Mildly dilated small bowel in LEFT upper quadrant of measures up to 3.5 cm with scattered air-fluid levels at varying levels, paucity of large bowel gas. No intra-abdominal mass effect or pathologic calcifications. No free air. Soft tissue planes and included osseous structures are nonsuspicious.  IMPRESSION: No acute cardiopulmonary process ; stable chronic pulmonary parenchymal changes.  Findings concerning for early small bowel obstruction. As there are varying small bowel air-fluid levels, enteritis could have this  appearance though, less favored considering paucity of large bowel gas.   Electronically Signed   By: Elon Alas   On: 05/03/2015 05:40     CBC  Recent Labs Lab 05/09/15 1220 05/09/15 1811 05/10/15 0440 05/12/15 0400 05/13/15 0359 05/14/15 0515  WBC 10.0 8.1 5.8 10.5 10.7* 7.7  HGB 14.5 13.4 11.4* 13.2 13.5 12.1  HCT 43.1 40.7 33.9* 39.3 39.7 37.4  PLT 353 354 308 329 353 295  MCV 89.6 90.6 89.9 87.5 88.2 90.3  MCH 30.1 29.8 30.2 29.4 30.0 29.2  MCHC 33.6 32.9 33.6 33.6 34.0 32.4  RDW 12.4 12.5 12.5 12.1 12.4 12.5  LYMPHSABS 0.6*  --   --   --   --   --   MONOABS 0.2  --   --   --   --   --   EOSABS 0.0  --   --   --   --   --   BASOSABS 0.0  --   --   --   --   --     Chemistries   Recent Labs Lab 05/09/15 1220 05/09/15 1811 05/10/15 0440 05/12/15 0400 05/13/15 0359 05/14/15 0515  NA 140  --  136 135 139 136  K 3.9  --  3.8 3.9 3.7 4.0  CL 102  --  107 101 104 103  CO2 25  --  20* 23 22 23   GLUCOSE 136*  --  105* 130* 108* 89  BUN 11  --  8 <5* <5* 7  CREATININE 0.79 0.66 0.58 0.40* 0.71 0.62  CALCIUM 10.3  --  8.6* 9.1 9.1 8.7*  MG  --   --   --   --  1.7 1.8  AST 20  --   --  16 19  --   ALT 20  --   --  16 16  --   ALKPHOS 41  --   --  35* 37*  --   BILITOT 1.1  --   --  0.4 0.7  --    ------------------------------------------------------------------------------------------------------------------ estimated creatinine clearance is 88.5 mL/min (by  C-G formula based on Cr of 0.62). ------------------------------------------------------------------------------------------------------------------ No results for input(s): HGBA1C in the last 72 hours. ------------------------------------------------------------------------------------------------------------------ No results for input(s): CHOL, HDL, LDLCALC, TRIG, CHOLHDL, LDLDIRECT in the last 72  hours. ------------------------------------------------------------------------------------------------------------------ No results for input(s): TSH, T4TOTAL, T3FREE, THYROIDAB in the last 72 hours.  Invalid input(s): FREET3 ------------------------------------------------------------------------------------------------------------------ No results for input(s): VITAMINB12, FOLATE, FERRITIN, TIBC, IRON, RETICCTPCT in the last 72 hours.  Coagulation profile  Recent Labs Lab 05/09/15 1811  INR 1.09    No results for input(s): DDIMER in the last 72 hours.  Cardiac Enzymes No results for input(s): CKMB, TROPONINI, MYOGLOBIN in the last 168 hours.  Invalid input(s): CK ------------------------------------------------------------------------------------------------------------------ Invalid input(s): POCBNP   Time Spent in minutes   35   SINGH,PRASHANT K M.D on 05/15/2015 at 9:41 AM  Between 7am to 7pm - Pager - [phone]  After 7pm go to [email or web address] - password [facility]  [facility]   Office  [phone]

## 2015-05-15 NOTE — Plan of Care (Signed)
Problem: Phase II Progression Outcomes Goal: Return of bowel function (flatus, BM) IF ABDOMINAL SURGERY:  Outcome: Progressing Patient reports a small amount of "stringy green loose bowel movment" after suppository and walking.

## 2015-05-15 NOTE — Progress Notes (Signed)
    Progress Note   Subjective  Undergoing NG clamp trial since 7 AM No nausea or vomiting Feels a little better still today No flatus, did have scant BM after second Dulcolax suppository, no flatus since Ambulating frequently    Objective   Vital signs in last 24 hours: Temp:  [98.1 F (36.7 C)-98.6 F (37 C)] 98.6 F (37 C) (05/28 1340) Pulse Rate:  [100-111] 100 (05/28 1340) Resp:  [16-18] 16 (05/28 1340) BP: (129-142)/(90-100) 142/100 mmHg (05/28 1340) SpO2:  [98 %-100 %] 100 % (05/28 1340) Weight:  [136 lb 3.2 oz (61.78 kg)] 136 lb 3.2 oz (61.78 kg) (05/28 0541) Last BM Date: 05/14/15 (no bm since rpior t surgery, reports very small amount of st) Gen: awake, alert, NAD HEENT: anicteric, op clear, ntg in place CV: RRR, no mrg Pulm: CTA b/l Abd: soft appropriately tender, dressing clean dry and intact, BS very hypoactive, ND  Ext: no c/c/e Neuro: nonfocal  Intake/Output from previous day: 05/27 0701 - 05/28 0700 In: 3701 [P.O.:460; I.V.:3241] Out: 2150 [Urine:475; Emesis/NG output:1675] Intake/Output this shift: Total I/O In: 351.7 [I.V.:351.7] Out: -   Lab Results:  Recent Labs  05/13/15 0359 05/14/15 0515  WBC 10.7* 7.7  HGB 13.5 12.1  HCT 39.7 37.4  PLT 353 295   BMET  Recent Labs  05/13/15 0359 05/14/15 0515  NA 139 136  K 3.7 4.0  CL 104 103  CO2 22 23  GLUCOSE 108* 89  BUN <5* 7  CREATININE 0.71 0.62  CALCIUM 9.1 8.7*   LFT  Recent Labs  05/13/15 0359  PROT 7.6  ALBUMIN 4.0  AST 19  ALT 16  ALKPHOS 37*  BILITOT 0.7      Assessment / Plan:   44 year old with ileal Crohn's, located by stricture and obstruction status post small bowel resection with primary anastomosis now with postoperative ileus  1. Crohn's enteritis status post resection -- improving though no flatus yet. NG tube clamp trial if no nausea or vomiting or increasing pain remove in the morning. --Consider TPN if postoperative ileus continues (has been nearly  one week since any meaningful nutrition) --Ambulate --All Crohn's meds on hold until after released from surgery, will follow-up with me after that    Principal Problem:   Small bowel obstruction Active Problems:   Abdominal pain   Crohn's regional enteritis   Essential hypertension   Exacerbation of Crohn's disease     LOS: 6 days   [place] M  05/15/2015, 4:02 PM

## 2015-05-16 DIAGNOSIS — E43 Unspecified severe protein-calorie malnutrition: Secondary | ICD-10-CM | POA: Insufficient documentation

## 2015-05-16 LAB — MAGNESIUM: Magnesium: 1.8 mg/dL (ref 1.7–2.4)

## 2015-05-16 LAB — PHOSPHORUS: PHOSPHORUS: 4.5 mg/dL (ref 2.5–4.6)

## 2015-05-16 LAB — BASIC METABOLIC PANEL
Anion gap: 10 (ref 5–15)
BUN: 5 mg/dL — AB (ref 6–20)
CO2: 22 mmol/L (ref 22–32)
CREATININE: 0.59 mg/dL (ref 0.44–1.00)
Calcium: 8.9 mg/dL (ref 8.9–10.3)
Chloride: 102 mmol/L (ref 101–111)
GFR calc Af Amer: >60 mL/min (ref >60)
GFR calc non Af Amer: >60 mL/min (ref >60)
Glucose, Bld: 83 mg/dL (ref 65–99)
POTASSIUM: 4.2 mmol/L (ref 3.5–5.1)
Sodium: 134 mmol/L — ABNORMAL LOW (ref 135–145)

## 2015-05-16 NOTE — Progress Notes (Signed)
Nutrition Follow-up  DOCUMENTATION CODES:  Severe malnutrition in context of acute illness/injury  INTERVENTION:  -Diet advancement per MD -Recommend intiate nutrition support, if bowel rest expected to last >7 days (now on Day 7) -RD to continue to monitor  NUTRITION DIAGNOSIS:  Inadequate oral intake related to inability to eat as evidenced by NPO status.  Ongoing.  GOAL:  Patient will meet greater than or equal to 90% of their needs  Unmet.  MONITOR:  Diet advancement, Labs, Weight trends, Skin, I & O's   ASSESSMENT: 44 y.o. female, with history of Crohn's colitis diagnosed 1 year ago, colonic polyps, gallbladder polyp, whose been started on Crohn's treatment for the last 1 year, was admitted for Crohn's colitis exacerbation with mostly stricture related symptoms of nausea vomiting and abdominal pain and was supposed to have small bowel resection in near future comes back with chief complaints of nausea vomiting and generalized abdominal pain ongoing for the last 2 days.  5/23: -Pt reports not having any solid foods since 5/14, pt was able to have liquids mainly. -Pt is interested in nutritional supplements once diet is advanced.  5/29: -Pt remains NPO, now NPO for 7 days. (Pt with no PO intake x 15 days) Recommend nutrition support initiation. -Pt's weight has decreased to 133 lb (7% weight loss x 1 week, significant for time frame). -NGT d/c. Nausea improved. -Per GI note, TPN is a consideration if post-op ileus continues. Estimated needs below.  Labs reviewed: Low Na, BUN Mg/Phos WNL  Height:  Ht Readings from Last 1 Encounters:  05/12/15 5\' 8"  (1.727 m)    Weight:  Wt Readings from Last 1 Encounters:  05/16/15 133 lb 13.1 oz (60.7 kg)    Ideal Body Weight:  63.6 kg  Wt Readings from Last 10 Encounters:  05/16/15 133 lb 13.1 oz (60.7 kg)  05/03/15 142 lb (64.411 kg)  04/20/15 144 lb 3.2 oz (65.409 kg)  03/26/15 143 lb (64.864 kg)  03/09/15 135 lb  (61.236 kg)  02/18/15 135 lb (61.236 kg)  02/09/15 139 lb (63.05 kg)  01/18/15 138 lb (62.596 kg)  01/15/15 135 lb 6.4 oz (61.417 kg)  12/31/14 136 lb 6 oz (61.859 kg)    BMI:  Body mass index is 20.35 kg/(m^2).  Estimated Nutritional Needs:  Kcal:  1600-1800  Protein:  75-85g  Fluid:  1.6L/day    Skin:  Wound (see comment) (abdominal incision)  Diet Order:  Diet NPO time specified Except for: Ice Chips  EDUCATION NEEDS:  No education needs identified at this time   Intake/Output Summary (Last 24 hours) at 05/16/15 1035 Last data filed at 05/16/15 0900  Gross per 24 hour  Intake 1253.34 ml  Output      0 ml  Net 1253.34 ml    Last BM:  5/27  Clayton Bibles, MS, RD, LDN Pager: [phone] After Hours Pager: [phone]

## 2015-05-16 NOTE — Progress Notes (Signed)
    Progress Note   Subjective  NG tube removed this morning No nausea or vomiting tolerating ice water No flatus or bowel movement Minimal pain, ambulating well and often IVFs off   Objective  Vital signs in last 24 hours: Temp:  [97.9 F (36.6 C)-98.6 F (37 C)] 98.4 F (36.9 C) (05/29 1325) Pulse Rate:  [91-101] 100 (05/29 1325) Resp:  [16-18] 18 (05/29 1325) BP: (127-151)/(87-97) 144/87 mmHg (05/29 1325) SpO2:  [96 %-100 %] 96 % (05/29 1325) Weight:  [133 lb 13.1 oz (60.7 kg)] 133 lb 13.1 oz (60.7 kg) (05/29 0549) Last BM Date: 05/14/15 Gen: awake, alert, NAD HEENT: anicteric, op clear, ntg out CV: RRR, no mrg Pulm: CTA b/l Abd: soft appropriately tender, dressing clean dry and intact, BS hypoactive, ND  Ext: no c/c/e Neuro: nonfocal  Intake/Output from previous day: 05/28 0701 - 05/29 0700 In: 1253.3 [I.V.:1253.3] Out: -  Intake/Output this shift: Total I/O In: 480 [P.O.:480] Out: -   Lab Results:  Recent Labs  05/14/15 0515  WBC 7.7  HGB 12.1  HCT 37.4  PLT 295   BMET  Recent Labs  05/14/15 0515 05/16/15 0451  NA 136 134*  K 4.0 4.2  CL 103 102  CO2 23 22  GLUCOSE 89 83  BUN 7 5*  CREATININE 0.62 0.59  CALCIUM 8.7* 8.9     Assessment & Plan  44 year old with ileal Crohn's, located by stricture and obstruction status post small bowel resection with primary anastomosis now with postoperative ileus  1. Crohn's enteritis status post resection -- improving though no flatus yet. Somewhat frustrating (to her) post-op ileus.  Doing well without NG, without increase in pain or nausea  --Trial of clear liquids --Consider TPN tomorrow if postoperative ileus persists --Ambulate --All Crohn's meds on hold until after released from surgery, will follow-up with me after that  Principal Problem:   Small bowel obstruction Active Problems:   Abdominal pain   Crohn's regional enteritis   Essential hypertension   Exacerbation of Crohn's disease  Ileus, postoperative   Protein-calorie malnutrition, severe     LOS: 7 days   PYRTLE, JAY M  05/16/2015, 6:31 PM Pager [phone] 8a-5p weekdays Call [phone] weekends, holidays and 5p-8a or per Amion

## 2015-05-16 NOTE — Progress Notes (Signed)
NG tube discontinued per MD order. Pt tolerated procedure well, will continue to monitor.

## 2015-05-16 NOTE — Progress Notes (Signed)
PATIENT DETAILS Name: Meghan Charles Age: 44 y.o. Sex: female Date of Birth: 12-18-1971 Admit Date: 05/09/2015 Admitting Physician Thurnell Lose, MD KJZ:[id number] NOT IN SYSTEM  Brief Narrative: Patient is a is a 44 y.o. female with Crohn's enteritis with stricturing disease causing recurrent SBO inspite of treatment with azathioprine and Entyvio.She was recently discharged on 5/18, during that admission there was discussion about surgery, but the patient wanted to go home first.She unfortunately was readmitted on 5/22 with stricturing symptoms and subsequently underwent laparotomy and segmental resection of 12 inches of mid ileum with primary anastomosis on 05/11/15.  Subjective: NGT removed, feels like she will pass flatus soon.  Assessment/Plan: Principal Problem:   Small bowel obstruction:secondary to Crohn's enteritis with stricturing disease.Failed maximal treatment as outpatient with immunomodulator's and biologics, hence required Laparotomy and resection of 12 inches of mid ileum on 5/24. Still awaiting return of bowel function. Gen surgery following.   Active Problems:   Crohn's regional enteritis:currently not on any maintenance medications, will be resumed when patient follow up with GI in the outpatient setting.     Severe malnutrition in context of acute illness/injury    Disposition: Remain inpatient  Antimicrobial agents  See below  Anti-infectives    Start     Dose/Rate Route Frequency Ordered Stop   05/12/15 0800  ciprofloxacin (CIPRO) IVPB 400 mg     400 mg 200 mL/hr over 60 Minutes Intravenous Every 12 hours 05/12/15 0725 05/12/15 2125   05/12/15 0800  metroNIDAZOLE (FLAGYL) IVPB 500 mg     500 mg 100 mL/hr over 60 Minutes Intravenous Every 8 hours 05/12/15 0725 05/12/15 1615   05/09/15 1615  ciprofloxacin (CIPRO) IVPB 400 mg  Status:  Discontinued     400 mg 200 mL/hr over 60 Minutes Intravenous Every 12 hours 05/09/15 1612 05/12/15 0725    05/09/15 1615  metroNIDAZOLE (FLAGYL) IVPB 500 mg  Status:  Discontinued     500 mg 100 mL/hr over 60 Minutes Intravenous Every 8 hours 05/09/15 1612 05/12/15 0725      DVT Prophylaxis: Prophylactic Lovenox   Code Status: Full code  Family Communication None at bedside  Procedures: 05/11/2015 By Dr Dalbert Batman - Exploratory laparotomy, biopsy mesenteric nodule, segmental resection of 12 inches of mid ileum with primary anastomosis  CONSULTS:  GI and general surgery  Time spent 25 minutes-Greater than 50% of this time was spent in counseling, explanation of diagnosis, planning of further management, and coordination of care.  MEDICATIONS: Scheduled Meds: . enoxaparin (LOVENOX) injection  40 mg Subcutaneous Q24H   Continuous Infusions:  PRN Meds:.hydrALAZINE, HYDROmorphone (DILAUDID) injection, methocarbamol (ROBAXIN)  IV, metoprolol, ondansetron **OR** ondansetron (ZOFRAN) IV, oxyCODONE-acetaminophen, phenol, sodium chloride, zolpidem    PHYSICAL EXAM: Vital signs in last 24 hours: Filed Vitals:   05/15/15 1340 05/15/15 1915 05/15/15 2046 05/16/15 0549  BP: 142/100 151/97 148/89 127/88  Pulse: 100 101 91 95  Temp: 98.6 F (37 C)  97.9 F (36.6 C) 98.6 F (37 C)  TempSrc: Oral  Oral Oral  Resp: 16  16 16   Height:      Weight:    60.7 kg (133 lb 13.1 oz)  SpO2: 100%  100% 98%    Weight change: -1.08 kg (-2 lb 6.1 oz) Filed Weights   05/14/15 0452 05/15/15 0541 05/16/15 0549  Weight: 62.143 kg (137 lb) 61.78 kg (136 lb 3.2 oz) 60.7 kg (133 lb 13.1 oz)  Body mass index is 20.35 kg/(m^2).   Gen Exam: Awake and alert with clear speech.  Neck: Supple, No JVD.   Chest: B/L Clear.   CVS: S1 S2 Regular, no murmurs.  Abdomen: soft, BS +-but very sluggish, non tender, non distended.  Extremities: no edema, lower extremities warm to touch Neurologic: Non Focal.   Skin: No Rash.  Wounds: N/A.    Intake/Output from previous day:  Intake/Output Summary (Last 24  hours) at 05/16/15 1353 Last data filed at 05/16/15 0900  Gross per 24 hour  Intake 901.67 ml  Output      0 ml  Net 901.67 ml     LAB RESULTS: CBC  Recent Labs Lab 05/09/15 1811 05/10/15 0440 05/12/15 0400 05/13/15 0359 05/14/15 0515  WBC 8.1 5.8 10.5 10.7* 7.7  HGB 13.4 11.4* 13.2 13.5 12.1  HCT 40.7 33.9* 39.3 39.7 37.4  PLT 354 308 329 353 295  MCV 90.6 89.9 87.5 88.2 90.3  MCH 29.8 30.2 29.4 30.0 29.2  MCHC 32.9 33.6 33.6 34.0 32.4  RDW 12.5 12.5 12.1 12.4 12.5    Chemistries   Recent Labs Lab 05/10/15 0440 05/12/15 0400 05/13/15 0359 05/14/15 0515 05/16/15 0451  NA 136 135 139 136 134*  K 3.8 3.9 3.7 4.0 4.2  CL 107 101 104 103 102  CO2 20* 23 22 23 22   GLUCOSE 105* 130* 108* 89 83  BUN 8 <5* <5* 7 5*  CREATININE 0.58 0.40* 0.71 0.62 0.59  CALCIUM 8.6* 9.1 9.1 8.7* 8.9  MG  --   --  1.7 1.8 1.8    CBG: No results for input(s): GLUCAP in the last 168 hours.  GFR Estimated Creatinine Clearance: 86.9 mL/min (by C-G formula based on Cr of 0.59).  Coagulation profile  Recent Labs Lab 05/09/15 1811  INR 1.09    Cardiac Enzymes No results for input(s): CKMB, TROPONINI, MYOGLOBIN in the last 168 hours.  Invalid input(s): CK  Invalid input(s): POCBNP No results for input(s): DDIMER in the last 72 hours. No results for input(s): HGBA1C in the last 72 hours. No results for input(s): CHOL, HDL, LDLCALC, TRIG, CHOLHDL, LDLDIRECT in the last 72 hours. No results for input(s): TSH, T4TOTAL, T3FREE, THYROIDAB in the last 72 hours.  Invalid input(s): FREET3 No results for input(s): VITAMINB12, FOLATE, FERRITIN, TIBC, IRON, RETICCTPCT in the last 72 hours. No results for input(s): LIPASE, AMYLASE in the last 72 hours.  Urine Studies No results for input(s): UHGB, CRYS in the last 72 hours.  Invalid input(s): UACOL, UAPR, USPG, UPH, UTP, UGL, UKET, UBIL, UNIT, UROB, ULEU, UEPI, UWBC, URBC, UBAC, CAST, UCOM, BILUA  MICROBIOLOGY: Recent Results  (from the past 240 hour(s))  Surgical pcr screen     Status: None   Collection Time: 05/10/15  9:40 AM  Result Value Ref Range Status   MRSA, PCR NEGATIVE NEGATIVE Final   Staphylococcus aureus NEGATIVE NEGATIVE Final    Comment:        The Xpert SA Assay (FDA approved for NASAL specimens in patients over 53 years of age), is one component of a comprehensive surveillance program.  Test performance has been validated by [facility] for patients greater than or equal to 51 year old. It is not intended to diagnose infection nor to guide or monitor treatment.     RADIOLOGY STUDIES/RESULTS: Ct Abdomen Pelvis W Contrast  05/03/2015   CLINICAL DATA:  44 year old female with a history of right lower quadrant pain for several days.  EXAM:  CT ABDOMEN AND PELVIS WITH CONTRAST  TECHNIQUE: Multidetector CT imaging of the abdomen and pelvis was performed using the standard protocol following bolus administration of intravenous contrast.  CONTRAST:  165m OMNIPAQUE IOHEXOL 300 MG/ML  SOLN  COMPARISON:  CT abdomen 12/26/2014, chest CT 12/21/2014, CT abdomen 09/22/2014  FINDINGS: Chest:  Surgical changes of prior breast augmentation.  Asymmetric appearance of the thoracic cage again evident.  Heart size within normal limits.  No pericardial fluid/ thickening.  No hiatal hernia.  No confluent airspace disease or pneumothorax.  Abdomen/ pelvis:  Unremarkable liver.  Unremarkable spleen.  Left adrenal gland a small. Nodule involving the medial limb of the right adrenal gland. 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  Unremarkable appearance of the bilateral kidneys.  Unremarkable appearance of the pancreas.  Unremarkable gallbladder.  No intra abdominal air. Small amount of free fluid layered within the pelvis, with low Hounsfield units.  Enteric contrast within stomach and small bowel, with contrast traversing the ileocecal valve. The colon is relatively decompressed with no adjacent  inflammatory changes.  Borderline dilated small bowel loops within the mid and distal small bowel. There is a transition from dilated small bowel to decompressed small bowel just proximal to the distal ileum and ileocecal valve (image 69 of series 2). This segment of small bowel is identical to the location of previously identified narrowing on the CT 12/26/2014. Contrast does traverse this segment.  The enteric contrast present on the current study decreases the sensitivity for detection of hyper enhancement of the mucosa an bowel wall. No associated inflammation within the mesenteric. No significantly increased blood flow within the mesenteric vessels (negative "comb sign").  Normal appendix.  Unremarkable appearance of the urinary bladder. Unremarkable appearance of the adnexa.  No evidence of acute bony abnormality. No significant degenerative changes. No sclerotic changes of the iliosacral joints. Unremarkable appearance of the proximal femurs.  IMPRESSION: Partial small bowel obstruction of the distal ileum with borderline proximal dilated small bowel loops. The transition point is located at the site of prior small bowel inflammation identified on CT 12/26/2014, compatible with developing stricture. The presence of enteric contrast on the current CT decreases the sensitivity for evaluation of the bowel wall, although the free fluid within the pelvis indicates degree of active inflammation.  Small size of the left adrenal gland may reflect adrenal insufficiency. Correlation with a history of longstanding steroid use recommended.  Signed,  JDulcy Fanny WEarleen Newport DO  [facility]  [facility]   Electronically Signed   By: JCorrie MckusickD.O.   On: 05/03/2015 11:48   Dg Abd 2 Views  05/09/2015   CLINICAL DATA:  Right lower quadrant pain for 6 days.  EXAM: ABDOMEN - 2 VIEW  COMPARISON:  05/04/2015  FINDINGS: Scattered large and small bowel gas is noted. A few dilated loops  of small bowel are again seen with air-fluid levels. This has increased slightly in the interval from the prior exam. Fecal material is noted throughout the colon. No free air is seen. No bony abnormality is noted.  IMPRESSION: Slight increase in the degree of small bowel dilatation likely related to partial small bowel obstruction as previously diagnosed.   Electronically Signed   By: MInez CatalinaM.D.   On: 05/09/2015 15:23   Dg Abd 2 Views  05/04/2015   CLINICAL DATA:  Right lower quadrant pain for 2 days  EXAM: ABDOMEN - 2 VIEW  COMPARISON:  05/03/2015  FINDINGS: Scattered large  and small bowel gas is noted. Mild residual prominence of the small bowel is noted. Contrast material is noted within the colon consistent with the recent CT examination. No free air is seen.  IMPRESSION: Slight residual small bowel dilatation. The previously seen changes were likely related to a partial small bowel obstruction   Electronically Signed   By: Inez Catalina M.D.   On: 05/04/2015 14:09   Dg Abd 2 Views  04/19/2015   CLINICAL DATA:  Right lower quadrant abdominal pain for 4 days. Nausea.  EXAM: ABDOMEN - 2 VIEW  COMPARISON:  01/15/2015  FINDINGS: No evidence of bowel obstruction or generalized adynamic ileus. Mild increased stool in the colon. No free air.  No evidence of renal or ureteral stones. Soft tissues are unremarkable. No significant bony abnormality.  IMPRESSION: 1. No acute findings. No evidence of bowel obstruction or generalized adynamic ileus. No free air. 2. Mild increased stool in the colon.   Electronically Signed   By: Lajean Manes M.D.   On: 04/19/2015 11:06   Dg Abd Acute W/chest  05/03/2015   CLINICAL DATA:  New RIGHT mid abdominal pain with nausea vomiting. History of Crohn's.  EXAM: DG ABDOMEN ACUTE W/ 1V CHEST  COMPARISON:  Abdominal radiograph Apr 19, 2015, chest radiograph February 09, 2015 and CT of the chest December 21, 2014  FINDINGS: Cardiomediastinal silhouette is unremarkable. Similar  chronic airspace opacities with scarring. No pleural effusion. No pneumothorax. Soft tissue planes and included osseous structures are nonsuspicious; bilateral breast implants.  Mildly dilated small bowel in LEFT upper quadrant of measures up to 3.5 cm with scattered air-fluid levels at varying levels, paucity of large bowel gas. No intra-abdominal mass effect or pathologic calcifications. No free air. Soft tissue planes and included osseous structures are nonsuspicious.  IMPRESSION: No acute cardiopulmonary process ; stable chronic pulmonary parenchymal changes.  Findings concerning for early small bowel obstruction. As there are varying small bowel air-fluid levels, enteritis could have this appearance though, less favored considering paucity of large bowel gas.   Electronically Signed   By: Elon Alas   On: 05/03/2015 05:40    Oren Binet, MD  [facility] Pager:[phone]  If 7PM-7AM, please contact night-coverage [email or web address] Password [email or web address] 05/16/2015, 1:53 PM   LOS: 7 days

## 2015-05-16 NOTE — Progress Notes (Signed)
Patient ID: Meghan Charles, female   DOB: 12-30-1970, 44 y.o.   MRN: [id number] 5 Days Post-Op  Subjective: No complaints. No nausea with NG clamped for 24 hours. No flatus or bowel movements yet  Objective: Vital signs in last 24 hours: Temp:  [97.9 F (36.6 C)-98.6 F (37 C)] 98.6 F (37 C) (05/29 0549) Pulse Rate:  [91-101] 95 (05/29 0549) Resp:  [16] 16 (05/29 0549) BP: (127-151)/(88-100) 127/88 mmHg (05/29 0549) SpO2:  [98 %-100 %] 98 % (05/29 0549) Weight:  [60.7 kg (133 lb 13.1 oz)] 60.7 kg (133 lb 13.1 oz) (05/29 0549) Last BM Date: 05/14/15  Intake/Output from previous day: 05/28 0701 - 05/29 0700 In: 1253.3 [I.V.:1253.3] Out: -  Intake/Output this shift:    General appearance: alert, cooperative and no distress GI: normal findings: bowel sounds normal and soft, non-tender Incision/Wound: dressing clean and dry  Lab Results:   Recent Labs  05/14/15 0515  WBC 7.7  HGB 12.1  HCT 37.4  PLT 295   BMET  Recent Labs  05/14/15 0515 05/16/15 0451  NA 136 134*  K 4.0 4.2  CL 103 102  CO2 23 22  GLUCOSE 89 83  BUN 7 5*  CREATININE 0.62 0.59  CALCIUM 8.7* 8.9     Studies/Results: No results found.  Anti-infectives: Anti-infectives    Start     Dose/Rate Route Frequency Ordered Stop   05/12/15 0800  ciprofloxacin (CIPRO) IVPB 400 mg     400 mg 200 mL/hr over 60 Minutes Intravenous Every 12 hours 05/12/15 0725 05/12/15 2125   05/12/15 0800  metroNIDAZOLE (FLAGYL) IVPB 500 mg     500 mg 100 mL/hr over 60 Minutes Intravenous Every 8 hours 05/12/15 0725 05/12/15 1615   05/09/15 1615  ciprofloxacin (CIPRO) IVPB 400 mg  Status:  Discontinued     400 mg 200 mL/hr over 60 Minutes Intravenous Every 12 hours 05/09/15 1612 05/12/15 0725   05/09/15 1615  metroNIDAZOLE (FLAGYL) IVPB 500 mg  Status:  Discontinued     500 mg 100 mL/hr over 60 Minutes Intravenous Every 8 hours 05/09/15 1612 05/12/15 0725      Assessment/Plan: s/p Procedure(s): Laparotomy  with resection of Chron's Disease small bowel resection biopsy mesenteric nodule Doing well. Discontinue NG tube. Ice chips and sips of water by mouth. Patient walking frequently.   LOS: 7 days    Rocky Gladden T 05/16/2015

## 2015-05-17 DIAGNOSIS — I10 Essential (primary) hypertension: Secondary | ICD-10-CM

## 2015-05-17 DIAGNOSIS — K50919 Crohn's disease, unspecified, with unspecified complications: Secondary | ICD-10-CM

## 2015-05-17 DIAGNOSIS — K5090 Crohn's disease, unspecified, without complications: Secondary | ICD-10-CM

## 2015-05-17 NOTE — Progress Notes (Signed)
PATIENT DETAILS Name: Meghan Charles Age: 44 y.o. Sex: female Date of Birth: 1971-08-12 Admit Date: 05/09/2015 Admitting Physician Thurnell Lose, MD AVW:[id number] NOT IN SYSTEM  Brief Narrative: Patient is a is a 44 y.o. female with Crohn's enteritis with stricturing disease causing recurrent SBO inspite of treatment with azathioprine and Entyvio.She was recently discharged on 5/18, during that admission there was discussion about surgery, but the patient wanted to go home first.She unfortunately was readmitted on 5/22 with recurrent symptoms and subsequently underwent laparotomy and segmental resection of 12 inches of mid ileum with primary anastomosis on 05/11/15.  Subjective: Feels like she is about to pass flatus. No BM yet.  Assessment/Plan: Principal Problem:   Small bowel obstruction:secondary to Crohn's enteritis with stricturing disease.Failed maximal treatment as outpatient with immunomodulator's and biologics, hence required Laparotomy and resection of 12 inches of mid ileum on 5/24. Postoperative course complicated by development of ileus, still awaiting return of bowel function. Gen surgery following and managing.   Active Problems:   Crohn's regional enteritis:currently not on any maintenance medications, will be resumed when patient follows up with GI in the outpatient setting.     Severe malnutrition in context of acute illness/injury    Disposition: Remain inpatient-home once cleared by general surgery-suspect in the next 1-2 days.  Antimicrobial agents  See below  Anti-infectives    Start     Dose/Rate Route Frequency Ordered Stop   05/12/15 0800  ciprofloxacin (CIPRO) IVPB 400 mg     400 mg 200 mL/hr over 60 Minutes Intravenous Every 12 hours 05/12/15 0725 05/12/15 2125   05/12/15 0800  metroNIDAZOLE (FLAGYL) IVPB 500 mg     500 mg 100 mL/hr over 60 Minutes Intravenous Every 8 hours 05/12/15 0725 05/12/15 1615   05/09/15 1615  ciprofloxacin  (CIPRO) IVPB 400 mg  Status:  Discontinued     400 mg 200 mL/hr over 60 Minutes Intravenous Every 12 hours 05/09/15 1612 05/12/15 0725   05/09/15 1615  metroNIDAZOLE (FLAGYL) IVPB 500 mg  Status:  Discontinued     500 mg 100 mL/hr over 60 Minutes Intravenous Every 8 hours 05/09/15 1612 05/12/15 0725      DVT Prophylaxis: Prophylactic Lovenox   Code Status: Full code  Family Communication None at bedside  Procedures: 05/11/2015 By Dr Dalbert Batman - Exploratory laparotomy, biopsy mesenteric nodule, segmental resection of 12 inches of mid ileum with primary anastomosis  CONSULTS:  GI and general surgery  Time spent 20 minutes-Greater than 50% of this time was spent in counseling, explanation of diagnosis, planning of further management, and coordination of care.  MEDICATIONS: Scheduled Meds: . enoxaparin (LOVENOX) injection  40 mg Subcutaneous Q24H   Continuous Infusions:  PRN Meds:.hydrALAZINE, HYDROmorphone (DILAUDID) injection, methocarbamol (ROBAXIN)  IV, metoprolol, ondansetron **OR** ondansetron (ZOFRAN) IV, oxyCODONE-acetaminophen, phenol, sodium chloride, zolpidem    PHYSICAL EXAM: Vital signs in last 24 hours: Filed Vitals:   05/16/15 0549 05/16/15 1325 05/16/15 2059 05/17/15 0558  BP: 127/88 144/87 127/85 110/78  Pulse: 95 100 102 100  Temp: 98.6 F (37 C) 98.4 F (36.9 C) 99.1 F (37.3 C) 98.5 F (36.9 C)  TempSrc: Oral Oral Oral Oral  Resp: 16 18  16   Height:      Weight: 60.7 kg (133 lb 13.1 oz)     SpO2: 98% 96% 100% 98%    Weight change:  Filed Weights   05/14/15 0452 05/15/15 0541 05/16/15 0549  Weight: 62.143 kg (137 lb) 61.78 kg (136 lb 3.2 oz) 60.7 kg (133 lb 13.1 oz)   Body mass index is 20.35 kg/(m^2).   Gen Exam: Awake and alert with clear speech.  Neck: Supple, No JVD.   Chest: B/L Clear.  No rales CVS: S1 S2 Regular, no murmurs.  Abdomen: soft, BS +-sluggish, non tender, non distended. Dressing in place nondistended Extremities: no  edema, lower extremities warm to touch Neurologic: Non Focal.   Skin: No Rash.  Wounds: N/A.    Intake/Output from previous day:  Intake/Output Summary (Last 24 hours) at 05/17/15 1124 Last data filed at 05/16/15 2100  Gross per 24 hour  Intake    720 ml  Output      0 ml  Net    720 ml     LAB RESULTS: CBC  Recent Labs Lab 05/12/15 0400 05/13/15 0359 05/14/15 0515  WBC 10.5 10.7* 7.7  HGB 13.2 13.5 12.1  HCT 39.3 39.7 37.4  PLT 329 353 295  MCV 87.5 88.2 90.3  MCH 29.4 30.0 29.2  MCHC 33.6 34.0 32.4  RDW 12.1 12.4 12.5    Chemistries   Recent Labs Lab 05/12/15 0400 05/13/15 0359 05/14/15 0515 05/16/15 0451  NA 135 139 136 134*  K 3.9 3.7 4.0 4.2  CL 101 104 103 102  CO2 23 22 23 22   GLUCOSE 130* 108* 89 83  BUN <5* <5* 7 5*  CREATININE 0.40* 0.71 0.62 0.59  CALCIUM 9.1 9.1 8.7* 8.9  MG  --  1.7 1.8 1.8    CBG: No results for input(s): GLUCAP in the last 168 hours.  GFR Estimated Creatinine Clearance: 86.9 mL/min (by C-G formula based on Cr of 0.59).  Coagulation profile No results for input(s): INR, PROTIME in the last 168 hours.  Cardiac Enzymes No results for input(s): CKMB, TROPONINI, MYOGLOBIN in the last 168 hours.  Invalid input(s): CK  Invalid input(s): POCBNP No results for input(s): DDIMER in the last 72 hours. No results for input(s): HGBA1C in the last 72 hours. No results for input(s): CHOL, HDL, LDLCALC, TRIG, CHOLHDL, LDLDIRECT in the last 72 hours. No results for input(s): TSH, T4TOTAL, T3FREE, THYROIDAB in the last 72 hours.  Invalid input(s): FREET3 No results for input(s): VITAMINB12, FOLATE, FERRITIN, TIBC, IRON, RETICCTPCT in the last 72 hours. No results for input(s): LIPASE, AMYLASE in the last 72 hours.  Urine Studies No results for input(s): UHGB, CRYS in the last 72 hours.  Invalid input(s): UACOL, UAPR, USPG, UPH, UTP, UGL, UKET, UBIL, UNIT, UROB, ULEU, UEPI, UWBC, URBC, UBAC, CAST, UCOM,  BILUA  MICROBIOLOGY: Recent Results (from the past 240 hour(s))  Surgical pcr screen     Status: None   Collection Time: 05/10/15  9:40 AM  Result Value Ref Range Status   MRSA, PCR NEGATIVE NEGATIVE Final   Staphylococcus aureus NEGATIVE NEGATIVE Final    Comment:        The Xpert SA Assay (FDA approved for NASAL specimens in patients over 31 years of age), is one component of a comprehensive surveillance program.  Test performance has been validated by [facility] for patients greater than or equal to 86 year old. It is not intended to diagnose infection nor to guide or monitor treatment.     RADIOLOGY STUDIES/RESULTS: Ct Abdomen Pelvis W Contrast  05/03/2015   CLINICAL DATA:  44 year old female with a history of right lower quadrant pain for several days.  EXAM: CT ABDOMEN AND PELVIS WITH CONTRAST  TECHNIQUE: Multidetector CT imaging of the abdomen and pelvis was performed using the standard protocol following bolus administration of intravenous contrast.  CONTRAST:  182m OMNIPAQUE IOHEXOL 300 MG/ML  SOLN  COMPARISON:  CT abdomen 12/26/2014, chest CT 12/21/2014, CT abdomen 09/22/2014  FINDINGS: Chest:  Surgical changes of prior breast augmentation.  Asymmetric appearance of the thoracic cage again evident.  Heart size within normal limits.  No pericardial fluid/ thickening.  No hiatal hernia.  No confluent airspace disease or pneumothorax.  Abdomen/ pelvis:  Unremarkable liver.  Unremarkable spleen.  Left adrenal gland a small. Nodule involving the medial limb of the right adrenal gland. 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  Unremarkable appearance of the bilateral kidneys.  Unremarkable appearance of the pancreas.  Unremarkable gallbladder.  No intra abdominal air. Small amount of free fluid layered within the pelvis, with low Hounsfield units.  Enteric contrast within stomach and small bowel, with contrast traversing the ileocecal valve. The colon is relatively  decompressed with no adjacent inflammatory changes.  Borderline dilated small bowel loops within the mid and distal small bowel. There is a transition from dilated small bowel to decompressed small bowel just proximal to the distal ileum and ileocecal valve (image 69 of series 2). This segment of small bowel is identical to the location of previously identified narrowing on the CT 12/26/2014. Contrast does traverse this segment.  The enteric contrast present on the current study decreases the sensitivity for detection of hyper enhancement of the mucosa an bowel wall. No associated inflammation within the mesenteric. No significantly increased blood flow within the mesenteric vessels (negative "comb sign").  Normal appendix.  Unremarkable appearance of the urinary bladder. Unremarkable appearance of the adnexa.  No evidence of acute bony abnormality. No significant degenerative changes. No sclerotic changes of the iliosacral joints. Unremarkable appearance of the proximal femurs.  IMPRESSION: Partial small bowel obstruction of the distal ileum with borderline proximal dilated small bowel loops. The transition point is located at the site of prior small bowel inflammation identified on CT 12/26/2014, compatible with developing stricture. The presence of enteric contrast on the current CT decreases the sensitivity for evaluation of the bowel wall, although the free fluid within the pelvis indicates degree of active inflammation.  Small size of the left adrenal gland may reflect adrenal insufficiency. Correlation with a history of longstanding steroid use recommended.  Signed,  JDulcy Fanny WEarleen Newport DO  [facility]  [facility]   Electronically Signed   By: JCorrie MckusickD.O.   On: 05/03/2015 11:48   Dg Abd 2 Views  05/09/2015   CLINICAL DATA:  Right lower quadrant pain for 6 days.  EXAM: ABDOMEN - 2 VIEW  COMPARISON:  05/04/2015  FINDINGS: Scattered large and small bowel gas  is noted. A few dilated loops of small bowel are again seen with air-fluid levels. This has increased slightly in the interval from the prior exam. Fecal material is noted throughout the colon. No free air is seen. No bony abnormality is noted.  IMPRESSION: Slight increase in the degree of small bowel dilatation likely related to partial small bowel obstruction as previously diagnosed.   Electronically Signed   By: MInez CatalinaM.D.   On: 05/09/2015 15:23   Dg Abd 2 Views  05/04/2015   CLINICAL DATA:  Right lower quadrant pain for 2 days  EXAM: ABDOMEN - 2 VIEW  COMPARISON:  05/03/2015  FINDINGS: Scattered large and small bowel gas is noted. Mild  residual prominence of the small bowel is noted. Contrast material is noted within the colon consistent with the recent CT examination. No free air is seen.  IMPRESSION: Slight residual small bowel dilatation. The previously seen changes were likely related to a partial small bowel obstruction   Electronically Signed   By: Inez Catalina M.D.   On: 05/04/2015 14:09   Dg Abd 2 Views  04/19/2015   CLINICAL DATA:  Right lower quadrant abdominal pain for 4 days. Nausea.  EXAM: ABDOMEN - 2 VIEW  COMPARISON:  01/15/2015  FINDINGS: No evidence of bowel obstruction or generalized adynamic ileus. Mild increased stool in the colon. No free air.  No evidence of renal or ureteral stones. Soft tissues are unremarkable. No significant bony abnormality.  IMPRESSION: 1. No acute findings. No evidence of bowel obstruction or generalized adynamic ileus. No free air. 2. Mild increased stool in the colon.   Electronically Signed   By: Lajean Manes M.D.   On: 04/19/2015 11:06   Dg Abd Acute W/chest  05/03/2015   CLINICAL DATA:  New RIGHT mid abdominal pain with nausea vomiting. History of Crohn's.  EXAM: DG ABDOMEN ACUTE W/ 1V CHEST  COMPARISON:  Abdominal radiograph Apr 19, 2015, chest radiograph February 09, 2015 and CT of the chest December 21, 2014  FINDINGS: Cardiomediastinal  silhouette is unremarkable. Similar chronic airspace opacities with scarring. No pleural effusion. No pneumothorax. Soft tissue planes and included osseous structures are nonsuspicious; bilateral breast implants.  Mildly dilated small bowel in LEFT upper quadrant of measures up to 3.5 cm with scattered air-fluid levels at varying levels, paucity of large bowel gas. No intra-abdominal mass effect or pathologic calcifications. No free air. Soft tissue planes and included osseous structures are nonsuspicious.  IMPRESSION: No acute cardiopulmonary process ; stable chronic pulmonary parenchymal changes.  Findings concerning for early small bowel obstruction. As there are varying small bowel air-fluid levels, enteritis could have this appearance though, less favored considering paucity of large bowel gas.   Electronically Signed   By: Elon Alas   On: 05/03/2015 05:40    Oren Binet, MD  [facility] Pager:[phone]  If 7PM-7AM, please contact night-coverage [email or web address] Password [email or web address] 05/17/2015, 11:24 AM   LOS: 8 days

## 2015-05-17 NOTE — Progress Notes (Addendum)
    Progress Note   Subjective  Had small BM this afternoon Feels better Fine on clear Dressing off, minimal if any incisional pain Surg 6 days ago   Objective  Vital signs in last 24 hours: Temp:  [98 F (36.7 C)-99.1 F (37.3 C)] 98 F (36.7 C) (05/30 1421) Pulse Rate:  [98-102] 98 (05/30 1421) Resp:  [16] 16 (05/30 1421) BP: (109-127)/(77-85) 109/77 mmHg (05/30 1421) SpO2:  [98 %-100 %] 99 % (05/30 1421) Last BM Date: 05/14/15 Gen: awake, alert, NAD HEENT: anicteric, op clear CV: RRR, no mrg Pulm: CTA b/l Abd: soft, NT/ND, incision without erythema or tenderness, bowel sounds have returned this afternoon Ext: no c/c/e Neuro: nonfocal   Intake/Output from previous day: 05/29 0701 - 05/30 0700 In: 720 [P.O.:720] Out: -  Intake/Output this shift:    Lab Results: No results for input(s): WBC, HGB, HCT, PLT in the last 72 hours. BMET  Recent Labs  05/16/15 0451  NA 134*  K 4.2  CL 102  CO2 22  GLUCOSE 83  BUN 5*  CREATININE 0.59  CALCIUM 8.9      Assessment & Plan  44 year old with ileal Crohn's, located by stricture and obstruction status post small bowel resection with primary anastomosis now with postoperative ileus  1. Crohn's enteritis status post resection -- had BM, not significant flatus, but BS have returned.  Very positive sign.  Discussed with Dr. Excell Seltzer --trial of full liquids --continue to ambulate --likely home soon, hold Crohn's meds until followup with me and release by surg --gi available, call with questions  Principal Problem:   Small bowel obstruction Active Problems:   Abdominal pain   Crohn's regional enteritis   Essential hypertension   Exacerbation of Crohn's disease   Ileus, postoperative   Protein-calorie malnutrition, severe     LOS: 8 days   Meghan Charles M  05/17/2015, 3:54 PM Pager [phone] 8a-5p weekdays Call [phone] weekends, holidays and 5p-8a or per Amion

## 2015-05-17 NOTE — Progress Notes (Signed)
Patient ID: Meghan Charles, female   DOB: [birth year], 44 y.o.   MRN: [id number] 6 Days Post-Op  Subjective: Says she feels better today. Less discomfort. Tolerating clear liquids without nausea or distention but no flatus or bowel movements.  Objective: Vital signs in last 24 hours: Temp:  [98.4 F (36.9 C)-99.1 F (37.3 C)] 98.5 F (36.9 C) (05/30 0558) Pulse Rate:  [100-102] 100 (05/30 0558) Resp:  [16-18] 16 (05/30 0558) BP: (110-144)/(78-87) 110/78 mmHg (05/30 0558) SpO2:  [96 %-100 %] 98 % (05/30 0558) Last BM Date: 05/14/15  Intake/Output from previous day: 05/29 0701 - 05/30 0700 In: 720 [P.O.:720] Out: -  Intake/Output this shift:    General appearance: alert, cooperative and no distress GI: normal findings: soft, non-tender and nondistended, bowel sounds present Incision/Wound: clean and dry without evidence of infection  Lab Results:  No results for input(s): WBC, HGB, HCT, PLT in the last 72 hours. BMET  Recent Labs  05/16/15 0451  NA 134*  K 4.2  CL 102  CO2 22  GLUCOSE 83  BUN 5*  CREATININE 0.59  CALCIUM 8.9     Studies/Results: No results found.  Anti-infectives: Anti-infectives    Start     Dose/Rate Route Frequency Ordered Stop   05/12/15 0800  ciprofloxacin (CIPRO) IVPB 400 mg     400 mg 200 mL/hr over 60 Minutes Intravenous Every 12 hours 05/12/15 0725 05/12/15 2125   05/12/15 0800  metroNIDAZOLE (FLAGYL) IVPB 500 mg     500 mg 100 mL/hr over 60 Minutes Intravenous Every 8 hours 05/12/15 0725 05/12/15 1615   05/09/15 1615  ciprofloxacin (CIPRO) IVPB 400 mg  Status:  Discontinued     400 mg 200 mL/hr over 60 Minutes Intravenous Every 12 hours 05/09/15 1612 05/12/15 0725   05/09/15 1615  metroNIDAZOLE (FLAGYL) IVPB 500 mg  Status:  Discontinued     500 mg 100 mL/hr over 60 Minutes Intravenous Every 8 hours 05/09/15 1612 05/12/15 0725      Assessment/Plan: s/p Procedure(s): Laparotomy with resection of Chron's Disease small bowel  resection biopsy mesenteric nodule Doing well but no bowel activity yet. Tolerating clear liquids. Leave on liquids today. Ambulation encouraged.   LOS: 8 days    Edwardo Wojnarowski T 05/17/2015

## 2015-05-18 LAB — BASIC METABOLIC PANEL
Anion gap: 10 (ref 5–15)
BUN: 8 mg/dL (ref 6–20)
CO2: 25 mmol/L (ref 22–32)
CREATININE: 0.6 mg/dL (ref 0.44–1.00)
Calcium: 9.7 mg/dL (ref 8.9–10.3)
Chloride: 102 mmol/L (ref 101–111)
GFR calc Af Amer: >60 mL/min (ref >60)
GFR calc non Af Amer: >60 mL/min (ref >60)
Glucose, Bld: 96 mg/dL (ref 65–99)
POTASSIUM: 4.1 mmol/L (ref 3.5–5.1)
Sodium: 137 mmol/L (ref 135–145)

## 2015-05-18 MED ORDER — HYDROCODONE-ACETAMINOPHEN 7.5-300 MG PO TABS: ORAL_TABLET | ORAL | Status: DC

## 2015-05-18 MED ORDER — HYOSCYAMINE SULFATE 0.125 MG SL SUBL: 0.1250 mg | SUBLINGUAL_TABLET | Freq: Four times a day (QID) | SUBLINGUAL | Status: DC | PRN

## 2015-05-18 NOTE — Progress Notes (Signed)
Nursing Discharge Summary  Patient ID: Meghan Charles MRN: [id number] DOB/AGE: September 03, 1971 44 y.o.  Admit date: 05/09/2015 Discharge date: 05/18/2015  Discharged Condition: good  Disposition: 01-Home or Self Care  Follow-up Information    Follow up with PYRTLE, Lajuan Lines, MD. Schedule an appointment as soon as possible for a visit in 2 weeks.   Specialty:  Gastroenterology   Contact information:   [address] [place] [address] [phone]       Follow up with Adin Hector, MD. Schedule an appointment as soon as possible for a visit in 2 weeks.   Specialty:  General Surgery   Why:  For post-operation check, call office to check appointment date/time   Contact information:   [address] [place] [address] [phone]       Follow up with [facility]. Go on 05/21/2015.   Why:  For staple removal with Dr. Darrel Hoover nurse at 2:00pm appointment, please arrive by 1:30pm to check in and fill out paperwork.   Contact information:   [place] [id number] [phone]      Prescriptions Given: Prescriptions for vicodin and levsin were given. Medications and follow up appointments discussed. Patient and mother verbalized understanding without further questions.   Means of Discharge: patient to be taken downstairs to be discharged home.   Signed: Buel Ream 05/18/2015, 1:58 PM

## 2015-05-18 NOTE — Progress Notes (Signed)
[facility] Progress Note  7 Days Post-Op  Subjective: Mom at bedside.  She plans on d/c home with mom who is a Marine scientist.  No significant pain, no N/V.  Very hungry, wanting solid diet.  Ambulating well.    Objective: Vital signs in last 24 hours: Temp:  [98 F (36.7 C)-98.6 F (37 C)] 98.6 F (37 C) (05/31 0631) Pulse Rate:  [96-99] 99 (05/31 0631) Resp:  [16-18] 18 (05/31 0631) BP: (109-124)/(74-89) 124/74 mmHg (05/31 0631) SpO2:  [99 %-100 %] 99 % (05/31 0631) Weight:  [59.421 kg (131 lb)] 59.421 kg (131 lb) (05/30 2134) Last BM Date: 05/17/15  Intake/Output from previous day: 05/30 0701 - 05/31 0700 In: 100 [P.O.:100] Out: -  Intake/Output this shift: Total I/O In: 240 [P.O.:240] Out: -   PE: Gen:  Alert, NAD, pleasant Abd: Soft, minimally tender, ND, staples in place, +BS, no HSM, incisions C/D/I   Lab Results:  No results for input(s): WBC, HGB, HCT, PLT in the last 72 hours. BMET  Recent Labs  05/16/15 0451 05/18/15 0432  NA 134* 137  K 4.2 4.1  CL 102 102  CO2 22 25  GLUCOSE 83 96  BUN 5* 8  CREATININE 0.59 0.60  CALCIUM 8.9 9.7   PT/INR No results for input(s): LABPROT, INR in the last 72 hours. CMP     Component Value Date/Time   NA 137 05/18/2015 0432   K 4.1 05/18/2015 0432   CL 102 05/18/2015 0432   CO2 25 05/18/2015 0432   GLUCOSE 96 05/18/2015 0432   BUN 8 05/18/2015 0432   CREATININE 0.60 05/18/2015 0432   CALCIUM 9.7 05/18/2015 0432   PROT 7.6 05/13/2015 0359   ALBUMIN 4.0 05/13/2015 0359   AST 19 05/13/2015 0359   ALT 16 05/13/2015 0359   ALKPHOS 37* 05/13/2015 0359   BILITOT 0.7 05/13/2015 0359   GFRNONAA >60 05/18/2015 0432   GFRAA >60 05/18/2015 0432   Lipase     Component Value Date/Time   LIPASE 28 05/09/2015 1220       Studies/Results: No results found.  Anti-infectives: Anti-infectives    Start     Dose/Rate Route Frequency Ordered Stop   05/12/15 0800  ciprofloxacin (CIPRO) IVPB 400 mg     400  mg 200 mL/hr over 60 Minutes Intravenous Every 12 hours 05/12/15 0725 05/12/15 2125   05/12/15 0800  metroNIDAZOLE (FLAGYL) IVPB 500 mg     500 mg 100 mL/hr over 60 Minutes Intravenous Every 8 hours 05/12/15 0725 05/12/15 1615   05/09/15 1615  ciprofloxacin (CIPRO) IVPB 400 mg  Status:  Discontinued     400 mg 200 mL/hr over 60 Minutes Intravenous Every 12 hours 05/09/15 1612 05/12/15 0725   05/09/15 1615  metroNIDAZOLE (FLAGYL) IVPB 500 mg  Status:  Discontinued     500 mg 100 mL/hr over 60 Minutes Intravenous Every 8 hours 05/09/15 1612 05/12/15 0725       Assessment/Plan Crohn's enteritis with partial obstruction at mid ileum, miliary granulomatous nodules of lower abdomen -POD #7 s/p Ex Lap, biopsy of mesenteric nodules, segmental resection of 12 inches of the mid ileum with primary anastamosis -NG discontinued on 05/15/15 and tolerating fulls, advance to soft diet -Dr. Hilarie Fredrickson following for Crohn's -Ambulate and IS -SCD's and lovenox -Pain control improved -Path shows granulomatous inflammation, no malignancy -If she tolerates solid diet at lunch can likely be discharged home -Post op check with Dr. Dalbert Batman in 2-3 weeks, staples to be removed on  Friday 05/21/15 arrive at 1:30pm for 2:00pm appointment    LOS: 9 days    Nat Christen 05/18/2015, 11:21 AM Pager: [phone]

## 2015-05-18 NOTE — Progress Notes (Signed)
PATIENT DETAILS Name: Meghan Charles Age: 44 y.o. Sex: female Date of Birth: 10/25/1971 Admit Date: 05/09/2015 Admitting Physician Thurnell Lose, MD [id number] NOT IN SYSTEM  Brief Narrative: Patient is a is a 44 y.o. female with Crohn's enteritis with stricturing disease causing recurrent SBO inspite of treatment with azathioprine and Entyvio.She was recently discharged on 5/18, during that admission there was discussion about surgery, but the patient wanted to go home first.She unfortunately was readmitted on 5/22 with recurrent symptoms and subsequently underwent laparotomy and segmental resection of 12 inches of mid ileum with primary anastomosis on 05/11/15.  Subjective:  BM yesterday!  Assessment/Plan: Principal Problem:   Small bowel obstruction:secondary to Crohn's enteritis with stricturing disease.Failed maximal treatment as outpatient with immunomodulator's and biologics, hence required Laparotomy and resection of 12 inches of mid ileum on 5/24. Postoperative course complicated by development of ileus, finally had BM on 5/30.Diet advanced to soft-suspect home later today if ok with Gen surgery  Active Problems:   Crohn's regional enteritis:currently not on any maintenance medications, will be resumed when patient follows up with GI in the outpatient setting.     Severe malnutrition in context of acute illness/injury    Disposition: Remain inpatient-home once cleared by general surgery-suspect later today  Antimicrobial agents  See below  Anti-infectives    Start     Dose/Rate Route Frequency Ordered Stop   05/12/15 0800  ciprofloxacin (CIPRO) IVPB 400 mg     400 mg 200 mL/hr over 60 Minutes Intravenous Every 12 hours 05/12/15 0725 05/12/15 2125   05/12/15 0800  metroNIDAZOLE (FLAGYL) IVPB 500 mg     500 mg 100 mL/hr over 60 Minutes Intravenous Every 8 hours 05/12/15 0725 05/12/15 1615   05/09/15 1615  ciprofloxacin (CIPRO) IVPB 400 mg  Status:   Discontinued     400 mg 200 mL/hr over 60 Minutes Intravenous Every 12 hours 05/09/15 1612 05/12/15 0725   05/09/15 1615  metroNIDAZOLE (FLAGYL) IVPB 500 mg  Status:  Discontinued     500 mg 100 mL/hr over 60 Minutes Intravenous Every 8 hours 05/09/15 1612 05/12/15 0725      DVT Prophylaxis: Prophylactic Lovenox   Code Status: Full code  Family Communication None at bedside  Procedures: 05/11/2015 By Dr Dalbert Batman - Exploratory laparotomy, biopsy mesenteric nodule, segmental resection of 12 inches of mid ileum with primary anastomosis  CONSULTS:  GI and general surgery  Time spent 15 minutes-Greater than 50% of this time was spent in counseling, explanation of diagnosis, planning of further management, and coordination of care.  MEDICATIONS: Scheduled Meds: . enoxaparin (LOVENOX) injection  40 mg Subcutaneous Q24H   Continuous Infusions:  PRN Meds:.hydrALAZINE, HYDROmorphone (DILAUDID) injection, methocarbamol (ROBAXIN)  IV, metoprolol, ondansetron **OR** ondansetron (ZOFRAN) IV, oxyCODONE-acetaminophen, phenol, sodium chloride, zolpidem    PHYSICAL EXAM: Vital signs in last 24 hours: Filed Vitals:   05/17/15 0558 05/17/15 1421 05/17/15 2134 05/18/15 0631  BP: 110/78 109/77 124/89 124/74  Pulse: 100 98 96 99  Temp: 98.5 F (36.9 C) 98 F (36.7 C) 98.6 F (37 C) 98.6 F (37 C)  TempSrc: Oral Oral Oral Oral  Resp: 16 16 18 18   Height:      Weight:   59.421 kg (131 lb)   SpO2: 98% 99% 100% 99%    Weight change:  Filed Weights   05/15/15 0541 05/16/15 0549 05/17/15 2134  Weight: 61.78 kg (136 lb 3.2 oz) 60.7  kg (133 lb 13.1 oz) 59.421 kg (131 lb)   Body mass index is 19.92 kg/(m^2).   Gen Exam: Awake and alert with clear speech.  Neck: Supple, No JVD.   Chest: B/L Clear.  No rales CVS: S1 S2 Regular, no murmurs.  Abdomen: soft, BS +-sluggish, non tender, non distended. Dressing in place nondistended Extremities: no edema, lower extremities warm to  touch Neurologic: Non Focal.   Skin: No Rash.  Wounds: N/A.    Intake/Output from previous day:  Intake/Output Summary (Last 24 hours) at 05/18/15 1104 Last data filed at 05/18/15 0900  Gross per 24 hour  Intake    340 ml  Output      0 ml  Net    340 ml     LAB RESULTS: CBC  Recent Labs Lab 05/12/15 0400 05/13/15 0359 05/14/15 0515  WBC 10.5 10.7* 7.7  HGB 13.2 13.5 12.1  HCT 39.3 39.7 37.4  PLT 329 353 295  MCV 87.5 88.2 90.3  MCH 29.4 30.0 29.2  MCHC 33.6 34.0 32.4  RDW 12.1 12.4 12.5    Chemistries   Recent Labs Lab 05/12/15 0400 05/13/15 0359 05/14/15 0515 05/16/15 0451 05/18/15 0432  NA 135 139 136 134* 137  K 3.9 3.7 4.0 4.2 4.1  CL 101 104 103 102 102  CO2 23 22 23 22 25   GLUCOSE 130* 108* 89 83 96  BUN <5* <5* 7 5* 8  CREATININE 0.40* 0.71 0.62 0.59 0.60  CALCIUM 9.1 9.1 8.7* 8.9 9.7  MG  --  1.7 1.8 1.8  --     CBG: No results for input(s): GLUCAP in the last 168 hours.  GFR Estimated Creatinine Clearance: 85 mL/min (by C-G formula based on Cr of 0.6).  Coagulation profile No results for input(s): INR, PROTIME in the last 168 hours.  Cardiac Enzymes No results for input(s): CKMB, TROPONINI, MYOGLOBIN in the last 168 hours.  Invalid input(s): CK  Invalid input(s): POCBNP No results for input(s): DDIMER in the last 72 hours. No results for input(s): HGBA1C in the last 72 hours. No results for input(s): CHOL, HDL, LDLCALC, TRIG, CHOLHDL, LDLDIRECT in the last 72 hours. No results for input(s): TSH, T4TOTAL, T3FREE, THYROIDAB in the last 72 hours.  Invalid input(s): FREET3 No results for input(s): VITAMINB12, FOLATE, FERRITIN, TIBC, IRON, RETICCTPCT in the last 72 hours. No results for input(s): LIPASE, AMYLASE in the last 72 hours.  Urine Studies No results for input(s): UHGB, CRYS in the last 72 hours.  Invalid input(s): UACOL, UAPR, USPG, UPH, UTP, UGL, UKET, UBIL, UNIT, UROB, ULEU, UEPI, UWBC, URBC, UBAC, CAST, UCOM,  BILUA  MICROBIOLOGY: Recent Results (from the past 240 hour(s))  Surgical pcr screen     Status: None   Collection Time: 05/10/15  9:40 AM  Result Value Ref Range Status   MRSA, PCR NEGATIVE NEGATIVE Final   Staphylococcus aureus NEGATIVE NEGATIVE Final    Comment:        The Xpert SA Assay (FDA approved for NASAL specimens in patients over 9 years of age), is one component of a comprehensive surveillance program.  Test performance has been validated by [facility] for patients greater than or equal to 70 year old. It is not intended to diagnose infection nor to guide or monitor treatment.     RADIOLOGY STUDIES/RESULTS: Ct Abdomen Pelvis W Contrast  05/03/2015   CLINICAL DATA:  44 year old female with a history of right lower quadrant pain for several days.  EXAM: CT  ABDOMEN AND PELVIS WITH CONTRAST  TECHNIQUE: Multidetector CT imaging of the abdomen and pelvis was performed using the standard protocol following bolus administration of intravenous contrast.  CONTRAST:  148m OMNIPAQUE IOHEXOL 300 MG/ML  SOLN  COMPARISON:  CT abdomen 12/26/2014, chest CT 12/21/2014, CT abdomen 09/22/2014  FINDINGS: Chest:  Surgical changes of prior breast augmentation.  Asymmetric appearance of the thoracic cage again evident.  Heart size within normal limits.  No pericardial fluid/ thickening.  No hiatal hernia.  No confluent airspace disease or pneumothorax.  Abdomen/ pelvis:  Unremarkable liver.  Unremarkable spleen.  Left adrenal gland a small. Nodule involving the medial limb of the right adrenal gland. 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  Unremarkable appearance of the bilateral kidneys.  Unremarkable appearance of the pancreas.  Unremarkable gallbladder.  No intra abdominal air. Small amount of free fluid layered within the pelvis, with low Hounsfield units.  Enteric contrast within stomach and small bowel, with contrast traversing the ileocecal valve. The colon is relatively  decompressed with no adjacent inflammatory changes.  Borderline dilated small bowel loops within the mid and distal small bowel. There is a transition from dilated small bowel to decompressed small bowel just proximal to the distal ileum and ileocecal valve (image 69 of series 2). This segment of small bowel is identical to the location of previously identified narrowing on the CT 12/26/2014. Contrast does traverse this segment.  The enteric contrast present on the current study decreases the sensitivity for detection of hyper enhancement of the mucosa an bowel wall. No associated inflammation within the mesenteric. No significantly increased blood flow within the mesenteric vessels (negative "comb sign").  Normal appendix.  Unremarkable appearance of the urinary bladder. Unremarkable appearance of the adnexa.  No evidence of acute bony abnormality. No significant degenerative changes. No sclerotic changes of the iliosacral joints. Unremarkable appearance of the proximal femurs.  IMPRESSION: Partial small bowel obstruction of the distal ileum with borderline proximal dilated small bowel loops. The transition point is located at the site of prior small bowel inflammation identified on CT 12/26/2014, compatible with developing stricture. The presence of enteric contrast on the current CT decreases the sensitivity for evaluation of the bowel wall, although the free fluid within the pelvis indicates degree of active inflammation.  Small size of the left adrenal gland may reflect adrenal insufficiency. Correlation with a history of longstanding steroid use recommended.  Signed,  JDulcy Fanny WEarleen Newport DO  [facility]  [facility]   Electronically Signed   By: JCorrie MckusickD.O.   On: 05/03/2015 11:48   Dg Abd 2 Views  05/09/2015   CLINICAL DATA:  Right lower quadrant pain for 6 days.  EXAM: ABDOMEN - 2 VIEW  COMPARISON:  05/04/2015  FINDINGS: Scattered large and small bowel gas  is noted. A few dilated loops of small bowel are again seen with air-fluid levels. This has increased slightly in the interval from the prior exam. Fecal material is noted throughout the colon. No free air is seen. No bony abnormality is noted.  IMPRESSION: Slight increase in the degree of small bowel dilatation likely related to partial small bowel obstruction as previously diagnosed.   Electronically Signed   By: MInez CatalinaM.D.   On: 05/09/2015 15:23   Dg Abd 2 Views  05/04/2015   CLINICAL DATA:  Right lower quadrant pain for 2 days  EXAM: ABDOMEN - 2 VIEW  COMPARISON:  05/03/2015  FINDINGS: Scattered large and  small bowel gas is noted. Mild residual prominence of the small bowel is noted. Contrast material is noted within the colon consistent with the recent CT examination. No free air is seen.  IMPRESSION: Slight residual small bowel dilatation. The previously seen changes were likely related to a partial small bowel obstruction   Electronically Signed   By: Inez Catalina M.D.   On: 05/04/2015 14:09   Dg Abd 2 Views  04/19/2015   CLINICAL DATA:  Right lower quadrant abdominal pain for 4 days. Nausea.  EXAM: ABDOMEN - 2 VIEW  COMPARISON:  01/15/2015  FINDINGS: No evidence of bowel obstruction or generalized adynamic ileus. Mild increased stool in the colon. No free air.  No evidence of renal or ureteral stones. Soft tissues are unremarkable. No significant bony abnormality.  IMPRESSION: 1. No acute findings. No evidence of bowel obstruction or generalized adynamic ileus. No free air. 2. Mild increased stool in the colon.   Electronically Signed   By: Lajean Manes M.D.   On: 04/19/2015 11:06   Dg Abd Acute W/chest  05/03/2015   CLINICAL DATA:  New RIGHT mid abdominal pain with nausea vomiting. History of Crohn's.  EXAM: DG ABDOMEN ACUTE W/ 1V CHEST  COMPARISON:  Abdominal radiograph Apr 19, 2015, chest radiograph February 09, 2015 and CT of the chest December 21, 2014  FINDINGS: Cardiomediastinal  silhouette is unremarkable. Similar chronic airspace opacities with scarring. No pleural effusion. No pneumothorax. Soft tissue planes and included osseous structures are nonsuspicious; bilateral breast implants.  Mildly dilated small bowel in LEFT upper quadrant of measures up to 3.5 cm with scattered air-fluid levels at varying levels, paucity of large bowel gas. No intra-abdominal mass effect or pathologic calcifications. No free air. Soft tissue planes and included osseous structures are nonsuspicious.  IMPRESSION: No acute cardiopulmonary process ; stable chronic pulmonary parenchymal changes.  Findings concerning for early small bowel obstruction. As there are varying small bowel air-fluid levels, enteritis could have this appearance though, less favored considering paucity of large bowel gas.   Electronically Signed   By: Elon Alas   On: 05/03/2015 05:40    Oren Binet, MD  [facility] Pager:[phone]  If 7PM-7AM, please contact night-coverage [email or web address] Password [email or web address] 05/18/2015, 11:04 AM   LOS: 9 days

## 2015-05-18 NOTE — Discharge Summary (Signed)
PATIENT DETAILS Name: Meghan Charles Age: 44 y.o. Sex: female Date of Birth: 10/20/1971 MRN: [id number]. Admitting Physician: Thurnell Lose, MD ERD:[id number] NOT IN SYSTEM  Admit Date: 05/09/2015 Discharge date: 05/18/2015  Recommendations for Outpatient Follow-up:  1. Please ensure follow-up with Dr. Hilarie Fredrickson for resumption of immunosuppressants/anti-inflammatory for Crohn's disease.  2. Ensure follow-up with general surgery.  PRIMARY DISCHARGE DIAGNOSIS:  Principal Problem:   Small bowel obstruction Active Problems:   Abdominal pain   Crohn's regional enteritis   Essential hypertension   Exacerbation of Crohn's disease   Ileus, postoperative   Protein-calorie malnutrition, severe      PAST MEDICAL HISTORY: Past Medical History  Diagnosis Date  . Gallbladder polyp   . Anxiety   . Colon polyps 05/2014    TUBULAR ADENOMA AND HYPERPLASTIC POLYP.  . Crohn's disease   . Tubular adenoma of colon     DISCHARGE MEDICATIONS: Current Discharge Medication List    CONTINUE these medications which have CHANGED   Details  Hydrocodone-Acetaminophen (VICODIN ES) 7.5-300 MG TABS Take 1-2 tablets by mouth every 6 hours as needed. Qty: 30 each, Refills: 0    hyoscyamine (LEVSIN SL) 0.125 MG SL tablet Place 1 tablet (0.125 mg total) under the tongue every 6 (six) hours as needed for cramping. Qty: 30 tablet, Refills: 0      CONTINUE these medications which have NOT CHANGED   Details  fluticasone (FLONASE) 50 MCG/ACT nasal spray Place 2 sprays into both nostrils daily. Qty: 16 g, Refills: 2      STOP taking these medications     promethazine (PHENERGAN) 12.5 MG tablet      vedolizumab (ENTYVIO) 300 MG injection      azaTHIOprine (IMURAN) 50 MG tablet         ALLERGIES:   Allergies  Allergen Reactions  . Morphine And Related Other (See Comments)    Headache    BRIEF HPI:  See H&P, Labs, Consult and Test reports for all details in brief, Patient is a is a 44  y.o. female with Crohn's enteritis with stricturing disease causing recurrent SBO inspite of treatment with azathioprine and Entyvio.She was recently discharged on 5/18, during that admission there was discussion about surgery, but the patient wanted to go home first.She unfortunately was readmitted on 5/22 with recurrent symptoms and subsequently underwent laparotomy and segmental resection of 12 inches of mid ileum with primary anastomosis on 05/11/15.  CONSULTATIONS:   GI and general surgery  PERTINENT RADIOLOGIC STUDIES: Ct Abdomen Pelvis W Contrast  05/03/2015   CLINICAL DATA:  44 year old female with a history of right lower quadrant pain for several days.  EXAM: CT ABDOMEN AND PELVIS WITH CONTRAST  TECHNIQUE: Multidetector CT imaging of the abdomen and pelvis was performed using the standard protocol following bolus administration of intravenous contrast.  CONTRAST:  165m OMNIPAQUE IOHEXOL 300 MG/ML  SOLN  COMPARISON:  CT abdomen 12/26/2014, chest CT 12/21/2014, CT abdomen 09/22/2014  FINDINGS: Chest:  Surgical changes of prior breast augmentation.  Asymmetric appearance of the thoracic cage again evident.  Heart size within normal limits.  No pericardial fluid/ thickening.  No hiatal hernia.  No confluent airspace disease or pneumothorax.  Abdomen/ pelvis:  Unremarkable liver.  Unremarkable spleen.  Left adrenal gland a small. Nodule involving the medial limb of the right adrenal gland. 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  Unremarkable appearance of the bilateral kidneys.  Unremarkable appearance of the pancreas.  Unremarkable gallbladder.  No intra abdominal air.  Small amount of free fluid layered within the pelvis, with low Hounsfield units.  Enteric contrast within stomach and small bowel, with contrast traversing the ileocecal valve. The colon is relatively decompressed with no adjacent inflammatory changes.  Borderline dilated small bowel loops within the mid and distal  small bowel. There is a transition from dilated small bowel to decompressed small bowel just proximal to the distal ileum and ileocecal valve (image 69 of series 2). This segment of small bowel is identical to the location of previously identified narrowing on the CT 12/26/2014. Contrast does traverse this segment.  The enteric contrast present on the current study decreases the sensitivity for detection of hyper enhancement of the mucosa an bowel wall. No associated inflammation within the mesenteric. No significantly increased blood flow within the mesenteric vessels (negative "comb sign").  Normal appendix.  Unremarkable appearance of the urinary bladder. Unremarkable appearance of the adnexa.  No evidence of acute bony abnormality. No significant degenerative changes. No sclerotic changes of the iliosacral joints. Unremarkable appearance of the proximal femurs.  IMPRESSION: Partial small bowel obstruction of the distal ileum with borderline proximal dilated small bowel loops. The transition point is located at the site of prior small bowel inflammation identified on CT 12/26/2014, compatible with developing stricture. The presence of enteric contrast on the current CT decreases the sensitivity for evaluation of the bowel wall, although the free fluid within the pelvis indicates degree of active inflammation.  Small size of the left adrenal gland may reflect adrenal insufficiency. Correlation with a history of longstanding steroid use recommended.  Signed,  Dulcy Fanny. Earleen Newport, DO  [facility]  [facility]   Electronically Signed   By: Corrie Mckusick D.O.   On: 05/03/2015 11:48   Dg Abd 2 Views  05/09/2015   CLINICAL DATA:  Right lower quadrant pain for 6 days.  EXAM: ABDOMEN - 2 VIEW  COMPARISON:  05/04/2015  FINDINGS: Scattered large and small bowel gas is noted. A few dilated loops of small bowel are again seen with air-fluid levels. This has increased slightly in  the interval from the prior exam. Fecal material is noted throughout the colon. No free air is seen. No bony abnormality is noted.  IMPRESSION: Slight increase in the degree of small bowel dilatation likely related to partial small bowel obstruction as previously diagnosed.   Electronically Signed   By: Inez Catalina M.D.   On: 05/09/2015 15:23   Dg Abd 2 Views  05/04/2015   CLINICAL DATA:  Right lower quadrant pain for 2 days  EXAM: ABDOMEN - 2 VIEW  COMPARISON:  05/03/2015  FINDINGS: Scattered large and small bowel gas is noted. Mild residual prominence of the small bowel is noted. Contrast material is noted within the colon consistent with the recent CT examination. No free air is seen.  IMPRESSION: Slight residual small bowel dilatation. The previously seen changes were likely related to a partial small bowel obstruction   Electronically Signed   By: Inez Catalina M.D.   On: 05/04/2015 14:09   Dg Abd 2 Views  04/19/2015   CLINICAL DATA:  Right lower quadrant abdominal pain for 4 days. Nausea.  EXAM: ABDOMEN - 2 VIEW  COMPARISON:  01/15/2015  FINDINGS: No evidence of bowel obstruction or generalized adynamic ileus. Mild increased stool in the colon. No free air.  No evidence of renal or ureteral stones. Soft tissues are unremarkable. No significant bony abnormality.  IMPRESSION: 1. No acute findings. No evidence of  bowel obstruction or generalized adynamic ileus. No free air. 2. Mild increased stool in the colon.   Electronically Signed   By: Lajean Manes M.D.   On: 04/19/2015 11:06   Dg Abd Acute W/chest  05/03/2015   CLINICAL DATA:  New RIGHT mid abdominal pain with nausea vomiting. History of Crohn's.  EXAM: DG ABDOMEN ACUTE W/ 1V CHEST  COMPARISON:  Abdominal radiograph Apr 19, 2015, chest radiograph February 09, 2015 and CT of the chest December 21, 2014  FINDINGS: Cardiomediastinal silhouette is unremarkable. Similar chronic airspace opacities with scarring. No pleural effusion. No pneumothorax. Soft  tissue planes and included osseous structures are nonsuspicious; bilateral breast implants.  Mildly dilated small bowel in LEFT upper quadrant of measures up to 3.5 cm with scattered air-fluid levels at varying levels, paucity of large bowel gas. No intra-abdominal mass effect or pathologic calcifications. No free air. Soft tissue planes and included osseous structures are nonsuspicious.  IMPRESSION: No acute cardiopulmonary process ; stable chronic pulmonary parenchymal changes.  Findings concerning for early small bowel obstruction. As there are varying small bowel air-fluid levels, enteritis could have this appearance though, less favored considering paucity of large bowel gas.   Electronically Signed   By: Elon Alas   On: 05/03/2015 05:40     PERTINENT LAB RESULTS: CBC: No results for input(s): WBC, HGB, HCT, PLT in the last 72 hours. CMET CMP     Component Value Date/Time   NA 137 05/18/2015 0432   K 4.1 05/18/2015 0432   CL 102 05/18/2015 0432   CO2 25 05/18/2015 0432   GLUCOSE 96 05/18/2015 0432   BUN 8 05/18/2015 0432   CREATININE 0.60 05/18/2015 0432   CALCIUM 9.7 05/18/2015 0432   PROT 7.6 05/13/2015 0359   ALBUMIN 4.0 05/13/2015 0359   AST 19 05/13/2015 0359   ALT 16 05/13/2015 0359   ALKPHOS 37* 05/13/2015 0359   BILITOT 0.7 05/13/2015 0359   GFRNONAA >60 05/18/2015 0432   GFRAA >60 05/18/2015 0432    GFR Estimated Creatinine Clearance: 85 mL/min (by C-G formula based on Cr of 0.6). No results for input(s): LIPASE, AMYLASE in the last 72 hours. No results for input(s): CKTOTAL, CKMB, CKMBINDEX, TROPONINI in the last 72 hours. Invalid input(s): POCBNP No results for input(s): DDIMER in the last 72 hours. No results for input(s): HGBA1C in the last 72 hours. No results for input(s): CHOL, HDL, LDLCALC, TRIG, CHOLHDL, LDLDIRECT in the last 72 hours. No results for input(s): TSH, T4TOTAL, T3FREE, THYROIDAB in the last 72 hours.  Invalid input(s): FREET3 No  results for input(s): VITAMINB12, FOLATE, FERRITIN, TIBC, IRON, RETICCTPCT in the last 72 hours. Coags: No results for input(s): INR in the last 72 hours.  Invalid input(s): PT Microbiology: Recent Results (from the past 240 hour(s))  Surgical pcr screen     Status: None   Collection Time: 05/10/15  9:40 AM  Result Value Ref Range Status   MRSA, PCR NEGATIVE NEGATIVE Final   Staphylococcus aureus NEGATIVE NEGATIVE Final    Comment:        The Xpert SA Assay (FDA approved for NASAL specimens in patients over 18 years of age), is one component of a comprehensive surveillance program.  Test performance has been validated by [facility] for patients greater than or equal to 37 year old. It is not intended to diagnose infection nor to guide or monitor treatment.      BRIEF HOSPITAL COURSE:  Small bowel obstruction:secondary to Crohn's enteritis with  stricturing disease.Failed maximal treatment as outpatient with immunomodulator's and biologics, hence required Laparotomy and resection of 12 inches of mid ileum on 5/24. Postoperative course complicated by development of ileus, finally had BM on 5/30.Diet advanced to soft diet on the day of discharge. Cleared by surgery for discharge as tolerated soft diet for lunch.Will need outpatient follow-up with both gastrology in general surgery.  Active Problems:  Crohn's regional enteritis:currently not on any maintenance medications, will be resumed when patient follows up with GI in the outpatient setting.    Severe malnutrition in context of acute illness/injury  TODAY-DAY OF DISCHARGE:  Subjective:   Dempsey Knotek today has no headache,no chest abdominal pain,no new weakness tingling or numbness, feels much better wants to go home today.  Objective:   Blood pressure 124/74, pulse 99, temperature 98.6 F (37 C), temperature source Oral, resp. rate 18, height 5' 8"  (1.727 m), weight 59.421 kg (131 lb), SpO2 99 %.  Intake/Output  Summary (Last 24 hours) at 05/18/15 1253 Last data filed at 05/18/15 0900  Gross per 24 hour  Intake    340 ml  Output      0 ml  Net    340 ml   Filed Weights   05/15/15 0541 05/16/15 0549 05/17/15 2134  Weight: 61.78 kg (136 lb 3.2 oz) 60.7 kg (133 lb 13.1 oz) 59.421 kg (131 lb)    Exam Awake Alert, Oriented *3, No new F.N deficits, Normal affect [place].AT,PERRAL Supple Neck,No JVD, No cervical lymphadenopathy appriciated.  Symmetrical Chest wall movement, Good air movement bilaterally, CTAB RRR,No Gallops,Rubs or new Murmurs, No Parasternal Heave +ve B.Sounds, Abd Soft, Non tender, No organomegaly appriciated, No rebound -guarding or rigidity. No Cyanosis, Clubbing or edema, No new Rash or bruise  DISCHARGE CONDITION: Stable  DISPOSITION: Home  DISCHARGE INSTRUCTIONS:    Activity:  As tolerated   Diet recommendation: Regular Diet-soft  Discharge Instructions    Call MD for:  redness, tenderness, or signs of infection (pain, swelling, redness, odor or green/yellow discharge around incision site)    Complete by:  As directed      Diet general    Complete by:  As directed   Stay on a soft diet till seen by Gen Surgery on follow up     Increase activity slowly    Complete by:  As directed            Follow-up Information    Follow up with PYRTLE, Lajuan Lines, MD. Schedule an appointment as soon as possible for a visit in 2 weeks.   Specialty:  Gastroenterology   Contact information:   [address] [place] [address] [phone]       Follow up with Adin Hector, MD. Schedule an appointment as soon as possible for a visit in 2 weeks.   Specialty:  General Surgery   Why:  For post-operation check, call office to check appointment date/time   Contact information:   [address] [place]  [address] [phone]       Follow up with [facility] on 05/21/2015.   Why:  For staple removal with Dr. Darrel Hoover nurse at 2:00pm appointment, please  arrive by 1:30pm to check in and fill out paperwork.   Contact information:   [place] [address] [phone]      Total Time spent on discharge equals  45 minutes.  SignedOren Binet 05/18/2015 12:53 PM

## 2015-05-18 NOTE — Discharge Instructions (Signed)
[facility]      [facility] [phone]  OPEN ABDOMINAL SURGERY: POST OP INSTRUCTIONS  Always review your discharge instruction sheet given to you by the facility where your surgery was performed.  IF YOU HAVE DISABILITY OR FAMILY LEAVE FORMS, YOU MUST BRING THEM TO THE OFFICE FOR PROCESSING.  PLEASE DO NOT GIVE THEM TO YOUR DOCTOR.  1. A prescription for pain medication may be given to you upon discharge.  Take your pain medication as prescribed, if needed.  If narcotic pain medicine is not needed, then you may take acetaminophen (Tylenol) or ibuprofen (Advil) as needed. 2. Take your usually prescribed medications unless otherwise directed. 3. If you need a refill on your pain medication, please contact your pharmacy. They will contact our office to request authorization.  Prescriptions will not be filled after 5pm or on week-ends. 4. You should follow a light diet the first few days after arrival home, such as soup and crackers, pudding, etc.unless your doctor has advised otherwise. A high-fiber, low fat diet can be resumed as tolerated.   Be sure to include lots of fluids daily. Most patients will experience some swelling and bruising on the chest and neck area.  Ice packs will help.  Swelling and bruising can take several days to resolve 5. Most patients will experience some swelling and bruising in the area of the incision. Ice pack will help. Swelling and bruising can take several days to resolve..  6. It is common to experience some constipation if taking pain medication after surgery.  Increasing fluid intake and taking a stool softener will usually help or prevent this problem from occurring.  A mild laxative (Milk of Magnesia or Miralax) should be taken according to package directions if there are no bowel movements after 48 hours. 7.  You may have steri-strips (small skin tapes) in place directly over the incision.  These strips should be left on the skin for 7-10 days.  If your  surgeon used skin glue on the incision, you may shower in 24 hours.  The glue will flake off over the next 2-3 weeks.  Any sutures or staples will be removed at the office during your follow-up visit. You may find that a light gauze bandage over your incision may keep your staples from being rubbed or pulled. You may shower and replace the bandage daily. 8. ACTIVITIES:  You may resume regular (light) daily activities beginning the next day--such as daily self-care, walking, climbing stairs--gradually increasing activities as tolerated.  You may have sexual intercourse when it is comfortable.  Refrain from any heavy lifting or straining until approved by your doctor. a. You may drive when you no longer are taking prescription pain medication, you can comfortably wear a seatbelt, and you can safely maneuver your car and apply brakes b. Return to Work: ___________________________________ 9. You should see your doctor in the office for a follow-up appointment approximately two weeks after your surgery.  Make sure that you call for this appointment within a day or two after you arrive home to insure a convenient appointment time. OTHER INSTRUCTIONS:  _____________________________________________________________ _____________________________________________________________  WHEN TO CALL YOUR DOCTOR: 1. Fever over 101.0 2. Inability to urinate 3. Nausea and/or vomiting 4. Extreme swelling or bruising 5. Continued bleeding from incision. 6. Increased pain, redness, or drainage from the incision. 7. Difficulty swallowing or breathing 8. Muscle cramping or spasms. 9. Numbness or tingling in hands or feet or around lips.  The clinic staff is available to   answer your questions during regular business hours.  Please don't hesitate to call and ask to speak to one of the nurses if you have concerns.  For further questions, please visit [email or web address]   

## 2015-05-18 NOTE — Care Management Note (Signed)
Case Management Note  Patient Details  Name: Meghan Charles MRN: [id number] Date of Birth: Sep 30, 1971  Subjective/Objective:    44 yo admitted with SBO                Action/Plan: From home alone  Expected Discharge Date:  05/14/15               Expected Discharge Plan:  Home/Self Care  In-House Referral:  NA  Discharge planning Services  CM Consult  Post Acute Care Choice:    Choice offered to:     DME Arranged:    DME Agency:     HH Arranged:    [place] Agency:     Status of Service:  In process, will continue to follow  Medicare Important Message Given:    Date Medicare IM Given:    Medicare IM give by:    Date Additional Medicare IM Given:    Additional Medicare Important Message give by:     If discussed at [place] of Stay Meetings, dates discussed:    Additional Comments: This CM was asked by staff RN for [facility] info for pt. Pt given [place] packet and informed of services. Lynnell Catalan, RN 05/18/2015, 1:28 PM

## 2015-06-14 ENCOUNTER — Ambulatory Visit: Payer: BLUE CROSS/BLUE SHIELD | Admitting: Internal Medicine

## 2015-07-05 ENCOUNTER — Ambulatory Visit (INDEPENDENT_AMBULATORY_CARE_PROVIDER_SITE_OTHER): Payer: BLUE CROSS/BLUE SHIELD | Admitting: Pulmonary Disease

## 2015-07-05 ENCOUNTER — Ambulatory Visit (INDEPENDENT_AMBULATORY_CARE_PROVIDER_SITE_OTHER)
Admission: RE | Admit: 2015-07-05 | Discharge: 2015-07-05 | Disposition: A | Discharge status: Home / Self Care | Payer: BLUE CROSS/BLUE SHIELD | Source: Ambulatory Visit | Attending: Pulmonary Disease | Admitting: Pulmonary Disease

## 2015-07-05 ENCOUNTER — Encounter: Payer: Self-pay | Admitting: Pulmonary Disease

## 2015-07-05 VITALS — BP 136/86 | HR 88 | Temp 97.7°F | Wt 138.6 lb

## 2015-07-05 DIAGNOSIS — J209 Acute bronchitis, unspecified: Secondary | ICD-10-CM

## 2015-07-05 DIAGNOSIS — K50919 Crohn's disease, unspecified, with unspecified complications: Secondary | ICD-10-CM | POA: Diagnosis not present

## 2015-07-05 DIAGNOSIS — R05 Cough: Secondary | ICD-10-CM

## 2015-07-05 DIAGNOSIS — R059 Cough, unspecified: Secondary | ICD-10-CM | POA: Insufficient documentation

## 2015-07-05 MED ORDER — LEVOFLOXACIN 500 MG PO TABS: 500.0000 mg | ORAL_TABLET | Freq: Every day | ORAL | Status: DC

## 2015-07-05 MED ORDER — HYDROCOD POLST-CPM POLST ER 10-8 MG/5ML PO SUER: 5.0000 mL | Freq: Two times a day (BID) | ORAL | Status: DC | PRN

## 2015-07-05 NOTE — Progress Notes (Signed)
Subjective:    Patient ID: Meghan Charles, female    DOB: [birth year], 44 y.o.   MRN: [id number]  HPI  OV 02/09/2015 w/ MR>  Chief Complaint  Patient presents with  . Follow-up    Pt stated her breathing is unchanged since seeing TP in 12/2014. Pt stated she still gets SOB with and without activity and at times she has pain on left rib area during inhalation. Pt denies cough.   44 year old female previously healthy but then was diagnosed with Crohn's disease in June 2015. In September 2000 and received her first dose of Remicade. Then on 09/24/2014 when she was supposed to get a second dose of Remicade Meghan Charles up in the hospital with bilateral pneumonia and associated left-sided exudative pleural effusion. Cultures have been negative. She was treated for a bacterial process.  Since then her Remicade has been on hold. Given significant serious adverse reaction with the Remicade alternative for her Crohn's disease has been explored. He also wanted to wait and give her sufficient time till she improved from her pneumonia. Since I last saw her she's had admissions for flare up of colitis therefore it was decided to start her monoclonal antibody treatment last week with Vedoluzumab IV: 300 mg at 0, 2, and 6 weeks and then every 8 weeks thereafte.So far she has tolerated it well.  She says that in terms of her deconditioning from her pneumonia and shortness of breath she significantly improved and almost to baseline. Occasionally she has some pain with deep inspiration in the left lower quadrant but otherwise is well. Her main issues that flareups of colitis. No cough or dyspnea or fever at this point  Chest x-ray today personally reviewed shows persistent fibrosis        OV 03/26/2015 w/ MR>  Chief Complaint  Patient presents with  . Acute Visit    Pt c/o prod cough pt unsure of color, slight increase in SOB, sinus pressure, PND x 5 days. Pt denies CP/tightness.  Immunosuppressed  patient because of Crohn's disease currently undergoing monoclonal antibody infusion every 8 weeks and Imuran. She had adverse reaction to TNF alpha blockade with severe pneumonia several months ago. Overall she's been doing well since I last saw him in February 2016. 6 days ago went to the [facility] but has been having a flare up of her usual spring allergies. Somewhat has been difficult to control despite Flonase MediPort and antihistamine systemic medication. However 3 days ago had low-grade fever and since then has had more tiredness rated as moderate severity, worsening cough with sinus drainage feels infected. She does not know the color of the sinus drainage. No further fever but does have a subjective sensation of feeling warm. No wheezing or shortness of breath or chest pain or orthopnea paroxysmal nocturnal dyspnea        OV 07/05/2015 w/ SN>   Chief Complaint  Patient presents with  . Acute Visit    Pt of MR> add-on appt requested for cough...      44 y/o WF w/ hx Crohn's disease followed by DrPyrtle> prev on ENTYVIO IV infusions- last dose 03/09/15, plus Pred & Imuran... She was [facility] 04/2015 w/ right sided pain, n/v/d, and CT Abd showing part SBO; [facility]CS recommended surgery & she had ELap, segmental resection of 12 inches of mid-ileum w/ primary anastomosis by DrHIngram (path revealed EXTENSIVE GRANULOMATOUS INFLAMMATION ASSOCIATED WITH FIBROSIS CONSISTENT WITH STRICTURE);  She was disch 05/18/15 w/ instructions to f/u w/ DrPyrtle to consider restarting  immunotherapy- she has an appt coming up soon;  Currently she is doing satis & denies abd pain, noting 2-3 soft formed stools daily...     Prev course was complic by bilat pneumonia w/ effusions 09/2014 in the setting of prev REMICADE therapy> at disch 04/2015 she was only on Flonase, Levsin, Vicodin...     She presents today w/ 1wk hx raspy cough, sm amy sput but not expectorated (?color), min SOB, no CP, denies f/c/s; she was concerned because of her  prev pneumonia & complications; she is a never smoker...  EXAM reveals Afeb, VSS, O2sat=98% on RA;  HEENT- neg, mallampati2;  Chest- sl end exp wheezing/ rhonchi on left, no rales or signs of consolidation;  Heart- RR w/o m/r/g;  Abd- soft, intact BS, nontender;  Ext- w/o c/c/e...  CXR Jan-Feb/ 2016 showed coarse nodular interstitial infiltrates in the mid lungs and apices bilaterally, bilateral hilar retraction, similar changes at been present dating back to 11/04/2012...  CT Chest 12/21/14 showed prev large left effusion has resolved, bilat airsp opacities have not changed (both upper lobes and bilat sup segm LLs)- likely post inflamm scarring...   CXR 05/03/15 showed no acute cardiopulmonary process, stable chronic pulmonary parenchymal changes...   CXR 07/05/15 showed norm heart size, chronic bilateral perihilar and upper lobe airspace opacities- likely scarring, NAD...    IMP/PLAN>>  Meghan Charles appears to have an acute bronchitic exac superimposed on her chronic lung changes (likely post inflamm scarring);  She has tolerated and responded well to [facility] in the past & we will Rx w/ 7d + ALIGN;  Her CC is the cough & hard to sleep at night due to this- Rx w/ TUSSIONEx prn use + DELSYM & MUCINEX prn... She has f/u visits planned w/ DrPyrtle & DrRamaswamy...     Past Medical History  Diagnosis Date   Hx pneumonia in the setting of Remicade Rx for Crohn's dis...    Post inflammatory pulm fibrosis bilat...   . Gallbladder polyp   . Anxiety   . Colon polyps 05/2014    TUBULAR ADENOMA AND HYPERPLASTIC POLYP.  . Crohn's disease   . Tubular adenoma of colon     Past Surgical History  Procedure Laterality Date  . Other surgical history      heel surgery  . Wisdom teeth extracted    . Right heel surgery      with plates and screws  . Colonoscopy w/ biopsies  2015  . Colon resection N/A 05/11/2015    Procedure: Laparotomy with resection of Chron's Disease small bowel resection biopsy  mesenteric nodule;  Surgeon: Fanny Skates, MD;  Location: [facility];  Service: General;  Laterality: N/A;    Outpatient Encounter Prescriptions as of 07/05/2015  Medication Sig  . fluticasone (FLONASE) 50 MCG/ACT nasal spray Place 2 sprays into both nostrils daily.  . Hydrocodone-Acetaminophen (VICODIN ES) 7.5-300 MG TABS Take 1-2 tablets by mouth every 6 hours as needed. (Patient not taking: Reported on 07/05/2015)  . hyoscyamine (LEVSIN SL) 0.125 MG SL tablet Place 1 tablet (0.125 mg total) under the tongue every 6 (six) hours as needed for cramping. (Patient not taking: Reported on 07/05/2015)    Allergies  Allergen Reactions  . Morphine And Related Other (See Comments)    Headache    Immunization History  Administered Date(s) Administered  . Hep A / Hep B 07/06/2014, 08/06/2014  . Pneumococcal Polysaccharide-23 07/01/2014    Current Medications, Allergies, Past Medical History, Past Surgical History, Family History, and Social  History were reviewed in [facility] record.   Review of Systems  Constitutional: Negative for fever and unexpected weight change.  HENT: Negative for congestion, dental problem, ear pain, nosebleeds, postnasal drip, rhinorrhea, sinus pressure, sneezing, sore throat and trouble swallowing.   Eyes: Negative for redness and itching.  Respiratory: Positive for cough and shortness of breath. Negative for chest tightness and wheezing.   Cardiovascular: Negative for palpitations and leg swelling.  Gastrointestinal: Negative for nausea and vomiting.  Genitourinary: Negative for dysuria.  Musculoskeletal: Negative for joint swelling.  Skin: Negative for rash.  Neurological: Negative for headaches.  Hematological: Does not bruise/bleed easily.  Psychiatric/Behavioral: Negative for dysphoric mood. The patient is not nervous/anxious.       Objective:   Physical Exam   Filed Vitals:   07/05/15 1217  BP: 136/86  Pulse: 88  Temp: 97.7 F  (36.5 C)  TempSrc: Oral  Weight: 138 lb 9.6 oz (62.869 kg)  SpO2: 98%   Constitutional: She is oriented to person, place, and time. She appears well-developed and well-nourished. No distress.  Head: Normocephalic and atraumatic.  Right Ear: External ear normal.  Left Ear: External ear normal.  Mouth/Throat: Oropharynx is clear and moist. No oropharyngeal exudate; mallampati 2... Eyes: Conjunctivae and EOM are normal. Pupils are equal, round, and reactive to light. Right eye exhibits no discharge. Left eye exhibits no discharge. No scleral icterus.  Neck: Normal range of motion. Neck supple. No JVD present. No tracheal deviation present. No thyromegaly present.  Cardiovascular: Normal rate, regular rhythm, normal heart sounds and intact distal pulses.  Exam reveals no gallop and no friction rub.   No murmur heard. Pulmonary/Chest: Effort normal and few exp rhonchi on left. No respiratory distress. She has no wheezes. She has no rales. She exhibits no tenderness.  Abdominal: Soft. Bowel sounds are normal. She exhibits no distension and no mass. There is no tenderness. There is no rebound and no guarding.  Musculoskeletal: Normal range of motion. She exhibits no edema or tenderness.  Lymphadenopathy: She has no cervical adenopathy.  Neurological: She is alert and oriented to person, place, and time. She has normal reflexes. No cranial nerve deficit. She exhibits normal muscle tone. Coordination normal.  Skin: Skin is warm and dry. No rash noted. She is not diaphoretic. No erythema. No pallor.  Psychiatric: She has a normal mood and affect. Her behavior is normal. Judgment and thought content normal.      Assessment & Plan:     ICD-9-CM ICD-10-CM   1. Acute bronchitis, unspecified organism 466.0 J20.9 DG Chest 2 View  2. Cough 786.2 R05   3. Crohn's regional enteritis, unspecified complication 975.8 I32.549    IMP/PLAN>>  Marri appears to have an acute bronchitic exac superimposed on her  chronic lung changes (likely post inflamm scarring);  She has tolerated and responded well to [facility] in the past & we will Rx w/ 7d + ALIGN;  Her CC is the cough & hard to sleep at night due to this- Rx w/ TUSSIONEx prn use + DELSYM & MUCINEX prn... She has f/u visits planned w/ DrPyrtle & DrRamaswamy  Patient's Medications  New Prescriptions   CHLORPHENIRAMINE-HYDROCODONE (TUSSIONEX PENNKINETIC ER) 10-8 MG/5ML SUER    Take 5 mLs by mouth every 12 (twelve) hours as needed for cough.   LEVOFLOXACIN (LEVAQUIN) 500 MG TABLET    Take 1 tablet (500 mg total) by mouth daily.  Previous Medications   FLUTICASONE (FLONASE) 50 MCG/ACT NASAL SPRAY  Place 2 sprays into both nostrils daily.   HYDROCODONE-ACETAMINOPHEN (VICODIN ES) 7.5-300 MG TABS    Take 1-2 tablets by mouth every 6 hours as needed.   HYOSCYAMINE (LEVSIN SL) 0.125 MG SL TABLET    Place 1 tablet (0.125 mg total) under the tongue every 6 (six) hours as needed for cramping.  Modified Medications   No medications on file  Discontinued Medications   No medications on file

## 2015-07-05 NOTE — Patient Instructions (Signed)
Today we updated your med list in our EPIC system...    Continue your current medications the same...  We decided to treat your bronchitic infection w/ LEVAQUIN 500mg  one tab daily x7d til gone...  Be sure to take a probiotic like ALIGN daily while on the Levaquin...  We also wrote for a good cough syrup- TUSSIONEX- one tsp every 12H as needed...  You may use OTC DELSYM in addition AND MUCINEX 600mg  tabs- 1to2 tabs twice daily w/ lots of water for the thick phlegm...  Call for any questions.Marland KitchenMarland Kitchen

## 2015-07-26 ENCOUNTER — Telehealth: Payer: Self-pay | Admitting: Internal Medicine

## 2015-07-26 NOTE — Telephone Encounter (Signed)
Spoke with pt and she is aware.

## 2015-07-26 NOTE — Telephone Encounter (Signed)
Pyrtle pt with history of Crohn's. Had resection in May. Pt calling stating that she is having pain on the right side of her abdomen, sometimes this happens after she eats. States the pain is not a cramping pain and it comes and goes. She vomited last week once and yesterday once. Pt would like something for the pain. Dr. Ardis Hughs as doc of the day please advise.

## 2015-07-26 NOTE — Telephone Encounter (Signed)
Safest is tylenol.  Lets try that first. Advise 2 extra strenght tylenol twice daily.

## 2015-08-10 ENCOUNTER — Ambulatory Visit (INDEPENDENT_AMBULATORY_CARE_PROVIDER_SITE_OTHER): Payer: BLUE CROSS/BLUE SHIELD | Admitting: Internal Medicine

## 2015-08-10 ENCOUNTER — Other Ambulatory Visit (INDEPENDENT_AMBULATORY_CARE_PROVIDER_SITE_OTHER): Payer: BLUE CROSS/BLUE SHIELD

## 2015-08-10 ENCOUNTER — Encounter: Payer: Self-pay | Admitting: Internal Medicine

## 2015-08-10 VITALS — BP 122/78 | HR 88 | Ht 66.0 in | Wt 144.0 lb

## 2015-08-10 DIAGNOSIS — F0631 Mood disorder due to known physiological condition with depressive features: Secondary | ICD-10-CM

## 2015-08-10 DIAGNOSIS — Z79899 Other long term (current) drug therapy: Secondary | ICD-10-CM

## 2015-08-10 DIAGNOSIS — F418 Other specified anxiety disorders: Secondary | ICD-10-CM

## 2015-08-10 DIAGNOSIS — K50919 Crohn's disease, unspecified, with unspecified complications: Secondary | ICD-10-CM

## 2015-08-10 DIAGNOSIS — F329 Major depressive disorder, single episode, unspecified: Secondary | ICD-10-CM | POA: Diagnosis not present

## 2015-08-10 DIAGNOSIS — F064 Anxiety disorder due to known physiological condition: Secondary | ICD-10-CM

## 2015-08-10 LAB — CBC WITH DIFFERENTIAL/PLATELET
BASOS ABS: 0 10*3/uL (ref 0.0–0.1)
BASOS PCT: 0.3 % (ref 0.0–3.0)
Eosinophils Absolute: 0.1 10*3/uL (ref 0.0–0.7)
Eosinophils Relative: 0.7 % (ref 0.0–5.0)
HEMATOCRIT: 42.4 % (ref 36.0–46.0)
Hemoglobin: 14.2 g/dL (ref 12.0–15.0)
LYMPHS ABS: 2.3 10*3/uL (ref 0.7–4.0)
LYMPHS PCT: 21.2 % (ref 12.0–46.0)
MCHC: 33.5 g/dL (ref 30.0–36.0)
MCV: 90.4 fl (ref 78.0–100.0)
MONO ABS: 0.7 10*3/uL (ref 0.1–1.0)
Monocytes Relative: 6.4 % (ref 3.0–12.0)
NEUTROS ABS: 7.7 10*3/uL (ref 1.4–7.7)
NEUTROS PCT: 71.4 % (ref 43.0–77.0)
Platelets: 358 10*3/uL (ref 150.0–400.0)
RBC: 4.69 Mil/uL (ref 3.87–5.11)
RDW: 13.3 % (ref 11.5–15.5)
WBC: 10.8 10*3/uL — ABNORMAL HIGH (ref 4.0–10.5)

## 2015-08-10 LAB — COMPREHENSIVE METABOLIC PANEL
ALT: 17 U/L (ref 0–35)
AST: 15 U/L (ref 0–37)
Albumin: 4.9 g/dL (ref 3.5–5.2)
Alkaline Phosphatase: 63 U/L (ref 39–117)
BILIRUBIN TOTAL: 0.3 mg/dL (ref 0.2–1.2)
BUN: 26 mg/dL — ABNORMAL HIGH (ref 6–23)
CALCIUM: 9.8 mg/dL (ref 8.4–10.5)
CHLORIDE: 107 meq/L (ref 96–112)
CO2: 25 meq/L (ref 19–32)
CREATININE: 0.68 mg/dL (ref 0.40–1.20)
GFR: 99.93 mL/min (ref >60.00)
GLUCOSE: 87 mg/dL (ref 70–99)
Potassium: 4.5 mEq/L (ref 3.5–5.1)
SODIUM: 140 meq/L (ref 135–145)
Total Protein: 8.5 g/dL — ABNORMAL HIGH (ref 6.0–8.3)

## 2015-08-10 LAB — HIGH SENSITIVITY CRP: CRP, High Sensitivity: 0.79 mg/L (ref 0.000–5.000)

## 2015-08-10 MED ORDER — SERTRALINE HCL 50 MG PO TABS: 50.0000 mg | ORAL_TABLET | Freq: Every day | ORAL | Status: DC

## 2015-08-10 MED ORDER — HYOSCYAMINE SULFATE 0.125 MG SL SUBL: 0.1250 mg | SUBLINGUAL_TABLET | Freq: Four times a day (QID) | SUBLINGUAL | Status: DC | PRN

## 2015-08-10 MED ORDER — HYDROCODONE-ACETAMINOPHEN 5-325 MG PO TABS: 1.0000 | ORAL_TABLET | Freq: Four times a day (QID) | ORAL | Status: DC | PRN

## 2015-08-10 MED ORDER — VSL#3 DS PO PACK: 1.0000 | PACK | Freq: Every day | ORAL | Status: DC

## 2015-08-10 NOTE — Progress Notes (Signed)
Subjective:    Patient ID: Meghan Charles, female    DOB: 1971-06-03, 44 y.o.   MRN: [id number]  HPI Meghan Charles is a 44 year old female with a past medical history of Crohn's enteritis requiring segmental small bowel resection in May 2016 who is seen for follow-up. She also has a history of complicated bilateral pneumonia requiring hospitalization in September 2015 shortly after her initial dose of Remicade. She was started on Entyvio and completed the 3 induction doses but then was hospitalized with Crohn's flare and partial small bowel obstruction resulting in surgery on 05/11/2015. This is her first follow-up visit with me.  She reports she has been feeling better. She's had significant fear and anxiety over the Crohn's coming back. This has impacted her daily life. That being said energy levels are better. She is working out again. She's slowly broadening her diet and adding foods such as popcorn and some sauces she couldn't previously eat. She is avoiding high-fiber foods and vegetables. On 3 episodes since discharge she has gotten "sick" which has been nausea resulting in vomiting. These episodes would happen before but the vomiting would continue for several days. It has not been this severe. She's noticed some intermittent vague right lower quadrant abdominal discomfort but nothing like before. Bowel movements a been soft but somewhat stringy occurring 2 times per day. She denies blood in her stool or melena. She was treated with Levaquin by pulmonary in July for URI. All pulmonary symptoms have improved and resolved.  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     Objective:   Physical Exam BP 122/78 mmHg  Pulse 88  Ht 5\' 6"  (1.676 m)  Wt 144 lb (65.318 kg)  BMI 23.25 kg/m2 Constitutional: Well-developed and well-nourished. No distress. HEENT: Normocephalic and atraumatic. Oropharynx is clear and moist. No oropharyngeal exudate. Conjunctivae are normal.  No scleral icterus. Neck: Neck supple. Trachea midline. Cardiovascular: Normal rate, regular rhythm and intact distal pulses. No M/R/G Pulmonary/chest: Effort normal and breath sounds normal. No wheezing, rales or rhonchi. Abdominal: Soft, well-healed vertical abdominal scar, mild tenderness right lower quadrant without rebound or guarding, nondistended. Bowel sounds active throughout. There are no masses palpable. No hepatosplenomegaly. Extremities: no clubbing, cyanosis, or edema Lymphadenopathy: No cervical adenopathy noted. Neurological: Alert and oriented to person place and time. Skin: Skin is warm and dry. No rashes noted. Psychiatric: Normal mood and affect. Behavior is normal.  Reviewed colonoscopy 05/26/2014 -- normal TI, 10 mm cecal adenoma removed with cold snare, 3 mm distal sigmoid hyperplastic polyps removed, left-sided diverticulosis  Operative note by Dr. Dalbert Batman reviewed -- 5 cm palpable mass mid ileum, 3 feet proximal to ileocecal valve. Focal area of disease with creeping fat. Multiple granulomatous areas seen. Pathology results = extensive granulomatous inflammation associated with fibrosis consistent with stricture, 9 lymph nodes with granulomas and areas of hyalinized fibrosis. No evidence of malignancy. The microscopic, and notes that inflammation extends through the full-thickness of the segment and into the peri-intestinal and subserosal connective tissue. Compatible with Crohn's disease, though it was noted sarcoid could not be excluded. AFB staining negative      Assessment & Plan:  44 year old female  with a past medical history of Crohn's enteritis  requiring segmental small bowel resection in May 2016 who is seen for follow-up.  1. Ileal Crohn's -- it is my impression that she is doing better though she is living in fear and somewhat traumatized by what she has been through related to her Crohn's disease. Given how aggressive her disease process was before I have recommended resuming biologic therapy to try to prevent recurrence at the anastomosis or in other places of her bowel. We will resume Entyvio with induction and then plan every 8 weeks thereafter. We discussed the risks, benefits and alternatives and she is agreeable to proceeding with this medication. Check CBC, CMP, CRP and TB blood test today. I'm going to start her on VSL No. 3 at IBD dose to try to help even out bowel habits, improved gas and bloating and also hopefully decrease the chance of recurrence. We'll give her Vicodin to be used on a very sparing basis should she develop pain and also Levsin for the same reason.  2. Healthcare related anxiety -- she has situational anxiety and mild mood disturbance secondary to her chronic medical condition, specifically Crohn's disease. I spent a great of time with her today discussing how this is not unexpected in can be normal in somebody who is been as sick as she has been requiring hospitalization and surgery. She is living in fear that this disease will come back. I provided reassurance that we're going to do everything medically possible to prevent recurrence. We discussed medication options which may help with anxiety and mood disturbance related to this chronic medical condition. She would like to try medicine and so I'm starting Zoloft 50 mg daily. We discussed the paradoxical risk of worsening anxiety, depression and even suicidality. I asked that she notify me should this occur though it is rare. She voiced understanding. This medicine may need to be titrated up to 100 mg daily  I like to see her back in 8-10 weeks, sooner if  necessary 45 minutes spent with Meghan Charles today

## 2015-08-10 NOTE — Patient Instructions (Addendum)
We have sent the following medications to your pharmacy for you to pick up at your convenience: Zoloft 50 mg daily Levsin 1-2 capsules by mouth every 6 hours as needed VSL #3 DS-Take 1 packet once daily  We have given you a written prescription for vicodin to take to your pharmacy.  Your physician has requested that you go to the basement for the following lab work before leaving today: CBC, CMP, CRP, TB Gold  We would like you to restart Entyvio with induction as soon as possible.  You have been scheduled for a follow up with Dr Hilarie Fredrickson for Tuesday, 10/12/15 @ 9:00 am  You may liberalize your diet as tolerated.

## 2015-08-12 LAB — QUANTIFERON TB GOLD ASSAY (BLOOD)
INTERFERON GAMMA RELEASE ASSAY: POSITIVE — AB
Mitogen value: >10 IU/mL
Quantiferon Nil Value: 0.04 IU/mL
Quantiferon Tb Ag Minus Nil Value: 1.05 IU/mL
TB Ag value: 1.09 IU/mL

## 2015-08-16 ENCOUNTER — Telehealth: Payer: Self-pay

## 2015-08-16 ENCOUNTER — Other Ambulatory Visit: Payer: Self-pay

## 2015-08-16 DIAGNOSIS — R7612 Nonspecific reaction to cell mediated immunity measurement of gamma interferon antigen response without active tuberculosis: Secondary | ICD-10-CM

## 2015-08-16 NOTE — Telephone Encounter (Signed)
-----   Message from Jerene Bears, MD sent at 08/16/2015 12:05 PM EDT ----- Regarding: RE: + Quantiferon gold yes ----- Message -----    From: Algernon Huxley, RN    Sent: 08/16/2015  11:13 AM      To: Jerene Bears, MD Subject: RE: + Quantiferon gold                         Hold Entyvio until seen by ID?  ----- Message -----    From: Jerene Bears, MD    Sent: 08/16/2015  10:57 AM      To: Algernon Huxley, RN, Brand Males, MD Subject: RE: + Quantiferon gold                         Ok thanks Ether Griffins, She needs ID referral (be sure she hasn't been there before) Otherwise, would refer to [facility] Thanks JMP   ----- Message -----    From: Brand Males, MD    Sent: 08/16/2015  10:25 AM      To: Jerene Bears, MD Subject: RE: + Quantiferon gold                         ID referral. She needs INH Rx. You might want to ask [place] or Johnnye Sima or [facility] or [place] if they will do it or has to go to health dept   ----- Message -----    From: Jerene Bears, MD    Sent: 08/16/2015  10:01 AM      To: Brand Males, MD Subject: + Quantiferon gold                             Murali Our mutual patient had a + quantiferon gold recently after having previously been negative. I would appreciate your recommendations here on what to do next Thanks Ulice Dash

## 2015-08-16 NOTE — Telephone Encounter (Signed)
Pt aware and referral entered in epic for ID consult with Dr. Baxter Flattery for positive quantiferon gold.

## 2015-08-17 ENCOUNTER — Telehealth: Payer: Self-pay | Admitting: Internal Medicine

## 2015-08-17 NOTE — Telephone Encounter (Signed)
Spoke with pts mother and explained the quantiferon gold result and the plan of ID consult.

## 2015-08-18 ENCOUNTER — Telehealth: Payer: Self-pay | Admitting: Internal Medicine

## 2015-08-20 NOTE — Telephone Encounter (Signed)
Spoke with Dr. Storm Frisk office and they are working on the appt. Pt aware.

## 2015-09-06 ENCOUNTER — Ambulatory Visit (INDEPENDENT_AMBULATORY_CARE_PROVIDER_SITE_OTHER): Payer: BLUE CROSS/BLUE SHIELD | Admitting: Internal Medicine

## 2015-09-06 ENCOUNTER — Encounter: Payer: Self-pay | Admitting: Internal Medicine

## 2015-09-06 VITALS — BP 147/105 | HR 106 | Temp 98.1°F | Ht 68.0 in | Wt 149.0 lb

## 2015-09-06 DIAGNOSIS — R7611 Nonspecific reaction to tuberculin skin test without active tuberculosis: Secondary | ICD-10-CM

## 2015-09-06 DIAGNOSIS — Z227 Latent tuberculosis: Secondary | ICD-10-CM | POA: Insufficient documentation

## 2015-09-06 MED ORDER — ISONIAZID 300 MG PO TABS: 300.0000 mg | ORAL_TABLET | Freq: Every day | ORAL | Status: DC

## 2015-09-06 MED ORDER — VITAMIN B-6 25 MG PO TABS: 25.0000 mg | ORAL_TABLET | Freq: Every day | ORAL | Status: DC

## 2015-09-06 NOTE — Progress Notes (Signed)
    [place] for Infectious Disease      Reason for Consult: latent Tb    Referring Physician: Dr. Hilarie Fredrickson  Recommendations: [place] for 6 months, B6 daily Will check CMP in 3-4 weeks  If tolerating well and no LFT abnormalities, could consider starting biologic after 1-2 months of therapy, depending on urgency.   Assessment: She has latent TB.  Recent CXR ok, no symptoms to suggest active disease.  Is going to get a monoclonal antibody.     Previous clinic notes, CXR, lab results personally reviewed.   Antibiotics: none  HPI: Meghan Charles is a 44 y.o. female with Crohn's disease, s/p small bowel resection May 2016, history of pneumonia after Remicaide last year and started on Entyvio but ended up with SBO and the surgery.  She was seen in follow up in August with Dr. Hilarie Fredrickson with the plan to resume Entyvio but work up noted a positive Quantiferon Gold test.  Has tested negative about 1 year ago but now is positive.  Does not remember any exposures, never been in jail, HIV negative in Oct 2015.  CXR in July 2015 without opacity.  No fever, no SOB, no night sweats, no weight loss.  Tearful during interview and exam.  Never travelled outside of country or lived outside of country.     Review of Systems: A comprehensive review of systems was negative except for: Constitutional: positive for anxious  Past Medical History  Diagnosis Date  . Gallbladder polyp   . Anxiety   . Colon polyps 05/2014    TUBULAR ADENOMA AND HYPERPLASTIC POLYP.  . Crohn's disease   . Tubular adenoma of colon     Social History  Substance Use Topics  . Smoking status: Never Smoker   . Smokeless tobacco: Never Used  . Alcohol Use: 0.0 oz/week    0 Standard drinks or equivalent per week     Comment: 3 times per week    Family History  Problem Relation Age of Onset  . Colon cancer Maternal Grandmother 47  . Esophageal cancer Neg Hx   . Stomach cancer Neg Hx   . Rectal cancer Neg Hx   .  Pancreatic cancer Neg Hx    Allergies  Allergen Reactions  . Morphine And Related Other (See Comments)    Headache    OBJECTIVE: Blood pressure 147/105, pulse 106, temperature 98.1 F (36.7 C), temperature source Oral, height 5\' 8"  (1.727 m), weight 149 lb (67.586 kg). General: awake, alert, nad HEENT: anicteric Skin: no rashes Lungs: CTA B Cor: RRR Abdomen: soft, nt Ext: no edema Neuro: non-focal   Microbiology: No results found for this or any previous visit (from the past 240 hour(s)).  Scharlene Gloss, [place] for Infectious [facility] [place] [email or web address] [phone] pager  [phone] cell 09/06/2015, 1:03 PM

## 2015-10-05 ENCOUNTER — Ambulatory Visit: Payer: BLUE CROSS/BLUE SHIELD | Admitting: Internal Medicine

## 2015-10-11 ENCOUNTER — Emergency Department (HOSPITAL_COMMUNITY)
Admission: EM | Admit: 2015-10-11 | Discharge: 2015-10-11 | Disposition: A | Discharge status: Home / Self Care | Payer: BLUE CROSS/BLUE SHIELD | Attending: Emergency Medicine | Admitting: Emergency Medicine

## 2015-10-11 ENCOUNTER — Encounter (HOSPITAL_COMMUNITY): Payer: Self-pay | Admitting: Emergency Medicine

## 2015-10-11 DIAGNOSIS — R112 Nausea with vomiting, unspecified: Secondary | ICD-10-CM | POA: Insufficient documentation

## 2015-10-11 DIAGNOSIS — Z86010 Personal history of colonic polyps: Secondary | ICD-10-CM | POA: Diagnosis not present

## 2015-10-11 DIAGNOSIS — R1084 Generalized abdominal pain: Secondary | ICD-10-CM | POA: Insufficient documentation

## 2015-10-11 DIAGNOSIS — Z86018 Personal history of other benign neoplasm: Secondary | ICD-10-CM | POA: Diagnosis not present

## 2015-10-11 DIAGNOSIS — F419 Anxiety disorder, unspecified: Secondary | ICD-10-CM | POA: Diagnosis not present

## 2015-10-11 DIAGNOSIS — Z79899 Other long term (current) drug therapy: Secondary | ICD-10-CM | POA: Insufficient documentation

## 2015-10-11 DIAGNOSIS — Z792 Long term (current) use of antibiotics: Secondary | ICD-10-CM | POA: Diagnosis not present

## 2015-10-11 DIAGNOSIS — Z8719 Personal history of other diseases of the digestive system: Secondary | ICD-10-CM | POA: Diagnosis not present

## 2015-10-11 LAB — URINALYSIS, ROUTINE W REFLEX MICROSCOPIC
BILIRUBIN URINE: NEGATIVE
GLUCOSE, UA: NEGATIVE mg/dL
KETONES UR: NEGATIVE mg/dL
Leukocytes, UA: NEGATIVE
Nitrite: NEGATIVE
PH: 6 (ref 5.0–8.0)
Protein, ur: NEGATIVE mg/dL
SPECIFIC GRAVITY, URINE: 1.02 (ref 1.005–1.030)
Urobilinogen, UA: 0.2 mg/dL (ref 0.0–1.0)

## 2015-10-11 LAB — URINE MICROSCOPIC-ADD ON

## 2015-10-11 LAB — CBC
HCT: 42 % (ref 36.0–46.0)
Hemoglobin: 14.1 g/dL (ref 12.0–15.0)
MCH: 30.7 pg (ref 26.0–34.0)
MCHC: 33.6 g/dL (ref 30.0–36.0)
MCV: 91.5 fL (ref 78.0–100.0)
Platelets: 391 10*3/uL (ref 150–400)
RBC: 4.59 MIL/uL (ref 3.87–5.11)
RDW: 13.1 % (ref 11.5–15.5)
WBC: 13.9 10*3/uL — ABNORMAL HIGH (ref 4.0–10.5)

## 2015-10-11 LAB — COMPREHENSIVE METABOLIC PANEL
ALT: 19 U/L (ref 14–54)
AST: 18 U/L (ref 15–41)
Albumin: 4.7 g/dL (ref 3.5–5.0)
Alkaline Phosphatase: 90 U/L (ref 38–126)
Anion gap: 9 (ref 5–15)
BUN: 22 mg/dL — ABNORMAL HIGH (ref 6–20)
CO2: 25 mmol/L (ref 22–32)
Calcium: 9.4 mg/dL (ref 8.9–10.3)
Chloride: 109 mmol/L (ref 101–111)
Creatinine, Ser: 0.61 mg/dL (ref 0.44–1.00)
GFR calc Af Amer: >60 mL/min (ref >60)
GFR calc non Af Amer: >60 mL/min (ref >60)
Glucose, Bld: 119 mg/dL — ABNORMAL HIGH (ref 65–99)
Potassium: 4.2 mmol/L (ref 3.5–5.1)
Sodium: 143 mmol/L (ref 135–145)
Total Bilirubin: 0.5 mg/dL (ref 0.3–1.2)
Total Protein: 9 g/dL — ABNORMAL HIGH (ref 6.5–8.1)

## 2015-10-11 LAB — LIPASE, BLOOD: Lipase: 39 U/L (ref 11–51)

## 2015-10-11 MED ORDER — HYDROMORPHONE HCL 1 MG/ML IJ SOLN
0.7500 mg | Freq: Once | INTRAMUSCULAR | Status: AC
  Administered 2015-10-11: 0.75 mg via INTRAVENOUS
  Filled 2015-10-11: qty 1

## 2015-10-11 MED ORDER — ONDANSETRON HCL 4 MG PO TABS: 4.0000 mg | ORAL_TABLET | Freq: Four times a day (QID) | ORAL | Status: DC

## 2015-10-11 MED ORDER — ONDANSETRON HCL 4 MG/2ML IJ SOLN
4.0000 mg | Freq: Once | INTRAMUSCULAR | Status: AC
  Administered 2015-10-11: 4 mg via INTRAVENOUS
  Filled 2015-10-11: qty 2

## 2015-10-11 MED ORDER — SODIUM CHLORIDE 0.9 % IV BOLUS (SEPSIS)
1000.0000 mL | Freq: Once | INTRAVENOUS | Status: AC
  Administered 2015-10-11: 1000 mL via INTRAVENOUS

## 2015-10-11 MED ORDER — ACETAMINOPHEN 325 MG PO TABS
650.0000 mg | ORAL_TABLET | Freq: Once | ORAL | Status: AC
  Administered 2015-10-11: 650 mg via ORAL
  Filled 2015-10-11: qty 2

## 2015-10-11 MED ORDER — TRAMADOL HCL 50 MG PO TABS: 50.0000 mg | ORAL_TABLET | Freq: Four times a day (QID) | ORAL | Status: DC | PRN

## 2015-10-11 NOTE — Discharge Instructions (Signed)

## 2015-10-11 NOTE — ED Notes (Signed)
Pt given crackers and water.  Pt states that she tolerated it well with no additional nausea.

## 2015-10-11 NOTE — ED Notes (Signed)
Provider notified of Pts elevated BP.

## 2015-10-11 NOTE — ED Notes (Signed)
Per pt, states vomiting since earlier this am-not sure if it a Chron's flare up

## 2015-10-12 ENCOUNTER — Ambulatory Visit (INDEPENDENT_AMBULATORY_CARE_PROVIDER_SITE_OTHER): Payer: BLUE CROSS/BLUE SHIELD | Admitting: Internal Medicine

## 2015-10-12 ENCOUNTER — Encounter: Payer: Self-pay | Admitting: Internal Medicine

## 2015-10-12 VITALS — Ht 68.0 in | Wt 139.0 lb

## 2015-10-12 DIAGNOSIS — F419 Anxiety disorder, unspecified: Secondary | ICD-10-CM | POA: Diagnosis not present

## 2015-10-12 DIAGNOSIS — R7611 Nonspecific reaction to tuberculin skin test without active tuberculosis: Secondary | ICD-10-CM

## 2015-10-12 DIAGNOSIS — K5000 Crohn's disease of small intestine without complications: Secondary | ICD-10-CM | POA: Diagnosis not present

## 2015-10-12 DIAGNOSIS — Z227 Latent tuberculosis: Secondary | ICD-10-CM

## 2015-10-12 MED ORDER — DULOXETINE HCL 60 MG PO CPEP: 60.0000 mg | ORAL_CAPSULE | Freq: Every day | ORAL | Status: DC

## 2015-10-12 MED ORDER — ONDANSETRON HCL 4 MG PO TABS: 4.0000 mg | ORAL_TABLET | Freq: Four times a day (QID) | ORAL | Status: DC | PRN

## 2015-10-12 NOTE — Patient Instructions (Signed)
You have been scheduled for a follow up with Dr Hilarie Fredrickson on Tuesday, 12/14/15 @ 2:00 pm.  Please discontinue Zoloft and Tramadol.  We have sent the following medications to your pharmacy for you to pick up at your convenience: Cymbalta 60 mg daily Zofran 4 mg as needed for nausea/vomiting.  We will get you started on Entyvio in early December.  Call our office if you have any nausea, vomiting or abdominal pain prior to your next visit.

## 2015-10-12 NOTE — Progress Notes (Signed)
Subjective:    Patient ID: Meghan Charles, female    DOB: 01-25-1971, 44 y.o.   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 She was last seen on 08/10/2015 and we discussed beginning Entyvio therapy after surgical resection of small bowel Crohn's disease. This was planned but her QuantiFERON gold was positive. She was seen by Dr. Linus Salmons with infectious disease and started on isoniazid therapy with B6 supplement. He recommended 6 months of therapy and also recommended delaying biologic therapy for 1-2 months if possible.  She reports that she had been feeling well from an abdominal standpoint and overall felt like she was improving. She has liberalized her diet slightly but continues to be nervous and try to eat mostly low residue. Yesterday she developed acute onset nausea and vomiting with slight right-sided abdominal pain. She went to the ER received IV Zofran and fluids and symptoms have resolved. Blood work yesterday showed a slightly elevated white count with normal hemoglobin. Liver enzymes and renal function were normal. No imaging was performed. Symptoms have improved dramatically. No nausea or vomiting today. She denies abdominal pain.  From an anxiety standpoint the Zoloft did not help. She took 50 mg for 6 weeks then 100 mg for 2 weeks. She stopped this because of no results. She still has some issues with anxiety over her Crohn's disease. She is agitated and possibly trying another medicine. She has some issues with initiation of sleep which is somewhat long-standing for her.   Review of Systems As per history of present illness, otherwise negative  Current Medications, Allergies, Past Medical History, Past Surgical History, Family History and Social History were reviewed in [employer] record.     Objective:   Physical Exam Ht 5\' 8"  (1.727 m)  Wt 139 lb (63.05 kg)  BMI 21.14 kg/m2 Constitutional: Well-developed and well-nourished. No distress. HEENT: Normocephalic and atraumatic. Oropharynx is clear and moist. No oropharyngeal exudate. Conjunctivae are normal.  No scleral icterus. Neck: Neck supple. Trachea midline. Cardiovascular: Normal rate, regular rhythm and intact distal pulses. No M/R/G Pulmonary/chest: Effort normal and breath sounds normal. No wheezing, rales or rhonchi. Abdominal: Soft, mild right-sided abdominal tenderness without rebound or guarding, nondistended. Bowel sounds active throughout. There are no masses palpable. No hepatosplenomegaly. Extremities: no clubbing, cyanosis, or edema Lymphadenopathy: No cervical adenopathy noted. Neurological: Alert and oriented to person place and time. Skin: Skin is warm and dry. No rashes noted. Psychiatric: Normal mood and affect. Behavior is normal.  CMP     Component Value Date/Time   NA 143 10/11/2015 0935   K 4.2 10/11/2015 0935   CL 109 10/11/2015 0935   CO2 25 10/11/2015 0935   GLUCOSE 119* 10/11/2015 0935   BUN 22* 10/11/2015 0935   CREATININE 0.61 10/11/2015 0935   CALCIUM 9.4 10/11/2015 0935   PROT 9.0* 10/11/2015 0935   ALBUMIN 4.7 10/11/2015 0935   AST 18 10/11/2015 0935   ALT 19 10/11/2015 0935   ALKPHOS 90 10/11/2015 0935   BILITOT 0.5 10/11/2015 0935   GFRNONAA >60 10/11/2015 0935   GFRAA >60 10/11/2015 0935    CBC    Component Value Date/Time   WBC 13.9* 10/11/2015 0935   RBC 4.59 10/11/2015 0935   RBC 3.91 11/05/2012 0837   HGB 14.1 10/11/2015 0935   HCT 42.0 10/11/2015  0935   PLT 391 10/11/2015 0935   MCV 91.5 10/11/2015 0935   MCH 30.7 10/11/2015 0935   MCHC 33.6 10/11/2015 0935   RDW 13.1 10/11/2015 0935   LYMPHSABS 2.3 08/10/2015 1012   MONOABS 0.7 08/10/2015 1012   EOSABS 0.1 08/10/2015 1012   BASOSABS 0.0 08/10/2015 1012       Assessment & Plan:    44 year old female with past medical history of Crohn's enteritis status post segmental small bowel resection in May 2016, 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  1. Ileal Crohn's disease/recent nausea and vomiting -- given acute onset of symptoms and now resolution it is likely that she developed a viral gastroenteritis rather than recurrent Crohn's disease. Her white count was elevated and this likely relates to an acute infectious process. She is not overly tender today in my suspicion for early recurrence of Crohn's disease is low. That said I would like her to start [facility] as soon as possible. Dr. Linus Salmons feels like waiting 1-2 months before Biologics in the setting of latent TB is recommended. She started isoniazid on 09/06/2015. We'll plan to initiate Entyvio with induction in early December which will be greater than 2 months on antibiotic therapy. I again discussed the risks, benefits and alternatives. She is agreeable to proceed. Want her to call me if she has progressive abdominal pain and recurrent nausea vomiting and my plan would be to repeat cross-sectional imaging likely with MR enterography.  2. Latent TB -- on isoniazid under the direction of Dr. Linus Salmons with ID,  6 month therapy planned  3. Health-care related anxiety -- no response to Zoloft. Trial of Cymbalta 60 mg daily. We discussed rare paradoxical worsening of anxiety depression even to include suicidality. She is asked to stop the medication immediately and seek emergency care and let me know should this occur. She voices understanding  Return in mid December, sooner if necessary

## 2015-10-22 NOTE — ED Provider Notes (Signed)
CSN: [id number]     Arrival date & time 10/11/15  0909 History   First MD Initiated Contact with Patient 10/11/15 [phone]     Chief Complaint  Patient presents with  . Emesis     (Consider location/radiation/quality/duration/timing/severity/associated sxs/prior Treatment) HPI   44 year old female with nausea and vomiting. Symptom onset early this morning. Went to bed in her usual state of health. Several episodes of nonbilious/nonbloody vomiting. Mild crampy abdominal pain. No appreciable exacerbating relieving factors. No diarrhea. No fevers or chills. No urinary complaints. Patient has a past history of Crohn's disease. She questions whether she may be having a flare.  Past Medical History  Diagnosis Date  . Gallbladder polyp   . Anxiety   . Colon polyps 05/2014    TUBULAR ADENOMA AND HYPERPLASTIC POLYP.  . Crohn's disease ([place])   . Tubular adenoma of colon    Past Surgical History  Procedure Laterality Date  . Other surgical history      heel surgery  . Wisdom teeth extracted    . Right heel surgery      with plates and screws  . Colonoscopy w/ biopsies  2015  . Colon resection N/A 05/11/2015    Procedure: Laparotomy with resection of Chron's Disease small bowel resection biopsy mesenteric nodule;  Surgeon: Fanny Skates, MD;  Location: [facility];  Service: General;  Laterality: N/A;   Family History  Problem Relation Age of Onset  . Colon cancer Maternal Grandmother 91  . Esophageal cancer Neg Hx   . Stomach cancer Neg Hx   . Rectal cancer Neg Hx   . Pancreatic cancer Neg Hx    Social History  Substance Use Topics  . Smoking status: Never Smoker   . Smokeless tobacco: Never Used  . Alcohol Use: 0.0 oz/week    0 Standard drinks or equivalent per week     Comment: 3 times per week   OB History    No data available     Review of Systems  All systems reviewed and negative, other than as noted in HPI.   Allergies  Morphine and related  Home Medications   Prior  to Admission medications   Medication Sig Start Date End Date Taking? Authorizing Provider  Calcium Carbonate-Vitamin D (CALCIUM-D PO) Take 2 tablets by mouth daily.   Yes Historical Provider, MD  isoniazid (NYDRAZID) 300 MG tablet Take 1 tablet (300 mg total) by mouth daily. Patient taking differently: Take 300 mg by mouth daily. For 2 months 9-19 to 11-18 09/06/15  Yes Thayer Headings, MD  vitamin B-6 (PYRIDOXINE) 25 MG tablet Take 1 tablet (25 mg total) by mouth daily. 09/06/15  Yes Thayer Headings, MD  DULoxetine (CYMBALTA) 60 MG capsule Take 1 capsule (60 mg total) by mouth daily. 10/12/15   Jerene Bears, MD  ondansetron (ZOFRAN) 4 MG tablet Take 1 tablet (4 mg total) by mouth every 6 (six) hours as needed for nausea or vomiting. 10/12/15   Jerene Bears, MD   BP 161/99 mmHg  Pulse 96  Temp(Src) 97.9 F (36.6 C) (Oral)  Resp 16  SpO2 98% Physical Exam  Constitutional: She appears well-developed and well-nourished. No distress.  HENT:  Head: Normocephalic and atraumatic.  Eyes: Conjunctivae are normal. Right eye exhibits no discharge. Left eye exhibits no discharge.  Neck: Neck supple.  Cardiovascular: Normal rate, regular rhythm and normal heart sounds.  Exam reveals no gallop and no friction rub.   No murmur heard. Pulmonary/Chest: Effort normal  and breath sounds normal. No respiratory distress.  Abdominal: Soft. She exhibits no distension. There is tenderness.  Mild diffuse tenderness without rebound or guarding. No distention.  Musculoskeletal: She exhibits no edema or tenderness.  Neurological: She is alert.  Skin: Skin is warm and dry.  Psychiatric: She has a normal mood and affect. Her behavior is normal. Thought content normal.  Nursing note and vitals reviewed.   ED Course  Procedures (including critical care time) Labs Review Labs Reviewed  COMPREHENSIVE METABOLIC PANEL - Abnormal; Notable for the following:    Glucose, Bld 119 (*)    BUN 22 (*)    Total Protein 9.0  (*)    All other components within normal limits  CBC - Abnormal; Notable for the following:    WBC 13.9 (*)    All other components within normal limits  URINALYSIS, ROUTINE W REFLEX MICROSCOPIC (NOT AT [facility]) - Abnormal; Notable for the following:    APPearance CLOUDY (*)    Hgb urine dipstick TRACE (*)    All other components within normal limits  URINE MICROSCOPIC-ADD ON - Abnormal; Notable for the following:    Squamous Epithelial / LPF FEW (*)    Bacteria, UA FEW (*)    All other components within normal limits  LIPASE, BLOOD    Imaging Review No results found. I have personally reviewed and evaluated these images and lab results as part of my medical decision-making.   EKG Interpretation None      MDM   Final diagnoses:  Non-intractable vomiting with nausea, vomiting of unspecified type    44 year old female with nausea and vomiting. Mild diffuse tenderness on abdominal exam, but no peritonitisss. She is treated symptomatically with significant improvement of her symptoms. Workup fairly unremarkable. She does have a past history of Crohn's disease. Question whether this may be a Crohn's flare. She reports she has good follow-up. Imaging was deferred at this time. Plan continued symptomatic treatment. Return precautions were discussed. Outpatient follow-up with her gastroenterologist otherwise.   Virgel Manifold, MD 10/22/15 1447

## 2015-11-01 ENCOUNTER — Telehealth: Payer: Self-pay | Admitting: Internal Medicine

## 2015-11-01 NOTE — Telephone Encounter (Signed)
Pt called to check on her Entyvio infusion. Per last OV note it states pt to resume Entyvio with induction in early December, you want her to receive 0,2,6 then every 8 weeks?  Pt also states she is still having problems sleeping and wanted to know if Dr. Hilarie Fredrickson would call in something for sleep.   Please advise.

## 2015-11-01 NOTE — Telephone Encounter (Signed)
Trial of ambien 10 mg qhs PRN Do not use with alcohol Do not use if she does not have full 8 hours to sleep   Entyvio induction at 0, 2, and 6 weeks, then every 8 week thereafter 300 mg IV

## 2015-11-02 ENCOUNTER — Other Ambulatory Visit: Payer: Self-pay

## 2015-11-02 MED ORDER — ZOLPIDEM TARTRATE 10 MG PO TABS: 10.0000 mg | ORAL_TABLET | Freq: Every evening | ORAL | Status: DC | PRN

## 2015-11-02 NOTE — Telephone Encounter (Signed)
Pt aware and script for Ambien sent to pharmacy.

## 2015-11-03 ENCOUNTER — Other Ambulatory Visit: Payer: Self-pay

## 2015-11-03 DIAGNOSIS — K50919 Crohn's disease, unspecified, with unspecified complications: Secondary | ICD-10-CM

## 2015-11-04 NOTE — Telephone Encounter (Signed)
Ok to move appts slightly to accommodate her schedule

## 2015-11-04 NOTE — Telephone Encounter (Signed)
Pt called and is aware of appts for entyvio but she wanted to go home for Christmas and wants to know if she can move the 12/10/15 tx to 12/14/15 or 12/15/15. Dr. Hilarie Fredrickson is it ok for her to move appt out or does she need to change her plans. Please advise.

## 2015-11-04 NOTE — Telephone Encounter (Signed)
Pt scheduled for Entyvio at [facility] short stay 11/25/15@10am , 12/10/15@8am , and 01/07/16@8am . Message left for pt to call and confirm that she got the message.

## 2015-11-05 NOTE — Telephone Encounter (Signed)
Pts second appt moved to 12/14/15@10am , and 01/11/16@9am . Pt aware.

## 2015-11-25 ENCOUNTER — Ambulatory Visit (HOSPITAL_COMMUNITY)
Admission: RE | Admit: 2015-11-25 | Discharge: 2015-11-25 | Disposition: A | Discharge status: Home / Self Care | Payer: BLUE CROSS/BLUE SHIELD | Source: Ambulatory Visit | Attending: Internal Medicine | Admitting: Internal Medicine

## 2015-11-25 ENCOUNTER — Encounter (HOSPITAL_COMMUNITY): Payer: Self-pay

## 2015-11-25 VITALS — BP 134/94 | HR 95 | Temp 98.4°F | Resp 16 | Ht 68.0 in | Wt 142.0 lb

## 2015-11-25 DIAGNOSIS — K50919 Crohn's disease, unspecified, with unspecified complications: Secondary | ICD-10-CM | POA: Diagnosis present

## 2015-11-25 MED ORDER — SODIUM CHLORIDE 0.9 % IV SOLN
INTRAVENOUS | Status: DC
  Administered 2015-11-25: 11:00:00 via INTRAVENOUS

## 2015-11-25 MED ORDER — VEDOLIZUMAB 300 MG IV SOLR
300.0000 mg | Freq: Once | INTRAVENOUS | Status: AC
  Administered 2015-11-25: 300 mg via INTRAVENOUS
  Filled 2015-11-25: qty 5

## 2015-11-25 NOTE — Progress Notes (Signed)
Uneventful infusion of 1st dose Entyvio. Pt discharged unaccompanied ambulatory to elevator

## 2015-11-25 NOTE — Discharge Instructions (Signed)
Vedolizumab injection solution °What is this medicine? °VEDOLIZUMAB (Ve doe LIZ you mab) is used to treat ulcerative colitis and Crohn's disease in adult patients. °This medicine may be used for other purposes; ask your health care provider or pharmacist if you have questions. °What should I tell my health care provider before I take this medicine? °They need to know if you have any of these conditions: °-diabetes °-hepatitis B or history of hepatitis B infection °-HIV or AIDS °-immune system problems °-infection or history of infections °-liver disease °-recently received or scheduled to receive a vaccine °-scheduled to have surgery °-tuberculosis, a positive skin test for tuberculosis or have recently been in close contact with someone who has tuberculosis °- °an unusual or allergic reaction to vedolizumab, other medicines, foods, dyes, or preservatives °-pregnant or trying to get pregnant °-breast-feeding °How should I use this medicine? °This medicine is for infusion into a vein. It is given by a health care professional in a hospital or clinic setting. °A special MedGuide will be given to you by the pharmacist with each prescription and refill. Be sure to read this information carefully each time. °Talk to your pediatrician regarding the use of this medicine in children. This medicine is not approved for use in children. °Overdosage: If you think you have taken too much of this medicine contact a poison control center or emergency room at once. °NOTE: This medicine is only for you. Do not share this medicine with others. °What if I miss a dose? °It is important not to miss your dose. Call your doctor or health care professional if you are unable to keep an appointment. °What may interact with this medicine? °-steroid medicines like prednisone or cortisone °-TNF-alpha inhibitors like natalizumab, adalimumab, and infliximab °-vaccines °This list may not describe all possible interactions. Give your health care  provider a list of all the medicines, herbs, non-prescription drugs, or dietary supplements you use. Also tell them if you smoke, drink alcohol, or use illegal drugs. Some items may interact with your medicine. °What should I watch for while using this medicine? °Your condition will be monitored carefully while you are receiving this medicine. Visit your doctor for regular check ups. Tell your doctor or healthcare professional if your symptoms do not start to get better or if they get worse. °Stay away from people who are sick. Call your doctor or health care professional for advice if you get a fever, chills or sore throat, or other symptoms of a cold or flu. Do not treat yourself. °In some patients, this medicine may cause a serious brain infection that may cause death. If you have any problems seeing, thinking, speaking, walking, or standing, tell your doctor right away. If you cannot reach your doctor, get urgent medical care. °What side effects may I notice from receiving this medicine? °Side effects that you should report to your doctor or health care professional as soon as possible: °-allergic reactions like skin rash, itching or hives, swelling of the face, lips, or tongue °-breathing problems °-changes in vision °-chest pain °-dark urine °-depression, feelings of sadness °-dizziness °-general ill feeling or flu-like symptoms °-irregular, missed, or painful menstrual periods °-light-colored stools °-loss of appetite, nausea °-muscle weakness °-problems with balance, talking, or walking °-right upper belly pain °-unusually weak or tired °-yellowing of the eyes or skin °Side effects that usually do not require medical attention (Report these to your doctor or health care professional if they continue or are bothersome.): °-aches, pains °-headache °-stomach upset °-tiredness °  This list may not describe all possible side effects. Call your doctor for medical advice about side effects. You may report side  effects to FDA at 1-800-FDA-1088. °Where should I keep my medicine? °This drug is given in a hospital or clinic and will not be stored at home. °NOTE: This sheet is a summary. It may not cover all possible information. If you have questions about this medicine, talk to your doctor, pharmacist, or health care provider. °  °© 2016, [employer]vier/[employer]. (2013-05-08 12:32:57) ° °

## 2015-12-03 ENCOUNTER — Other Ambulatory Visit: Payer: Self-pay | Admitting: Internal Medicine

## 2015-12-03 NOTE — Telephone Encounter (Signed)
Dr Hilarie Fredrickson- Patient requests refills for Ambien. Last rx was for #30, given 11-02-15. It appears rx was for PRN. Do you want me to continue filling or was this temporary rx?

## 2015-12-06 NOTE — Telephone Encounter (Signed)
Ok to refill 

## 2015-12-10 ENCOUNTER — Encounter (HOSPITAL_COMMUNITY): Payer: BLUE CROSS/BLUE SHIELD

## 2015-12-14 ENCOUNTER — Encounter (HOSPITAL_COMMUNITY): Payer: Self-pay

## 2015-12-14 ENCOUNTER — Encounter (HOSPITAL_COMMUNITY)
Admission: RE | Admit: 2015-12-14 | Discharge: 2015-12-14 | Disposition: A | Discharge status: Home / Self Care | Payer: BLUE CROSS/BLUE SHIELD | Source: Ambulatory Visit | Attending: Internal Medicine | Admitting: Internal Medicine

## 2015-12-14 ENCOUNTER — Ambulatory Visit (INDEPENDENT_AMBULATORY_CARE_PROVIDER_SITE_OTHER): Payer: BLUE CROSS/BLUE SHIELD | Admitting: Internal Medicine

## 2015-12-14 ENCOUNTER — Encounter: Payer: Self-pay | Admitting: Internal Medicine

## 2015-12-14 VITALS — BP 124/72 | HR 80 | Ht 66.0 in | Wt 147.0 lb

## 2015-12-14 VITALS — BP 120/90 | HR 100 | Temp 98.8°F | Resp 18 | Ht 68.0 in | Wt 142.0 lb

## 2015-12-14 DIAGNOSIS — K5000 Crohn's disease of small intestine without complications: Secondary | ICD-10-CM | POA: Diagnosis not present

## 2015-12-14 DIAGNOSIS — F418 Other specified anxiety disorders: Secondary | ICD-10-CM | POA: Diagnosis not present

## 2015-12-14 DIAGNOSIS — R7611 Nonspecific reaction to tuberculin skin test without active tuberculosis: Secondary | ICD-10-CM

## 2015-12-14 DIAGNOSIS — K50919 Crohn's disease, unspecified, with unspecified complications: Secondary | ICD-10-CM | POA: Diagnosis not present

## 2015-12-14 DIAGNOSIS — Z79899 Other long term (current) drug therapy: Secondary | ICD-10-CM

## 2015-12-14 DIAGNOSIS — Z227 Latent tuberculosis: Secondary | ICD-10-CM

## 2015-12-14 DIAGNOSIS — F064 Anxiety disorder due to known physiological condition: Secondary | ICD-10-CM

## 2015-12-14 MED ORDER — SODIUM CHLORIDE 0.9 % IV SOLN
INTRAVENOUS | Status: DC
  Administered 2015-12-14: 11:00:00 via INTRAVENOUS

## 2015-12-14 MED ORDER — VEDOLIZUMAB 300 MG IV SOLR
300.0000 mg | Freq: Once | INTRAVENOUS | Status: AC
  Administered 2015-12-14: 300 mg via INTRAVENOUS
  Filled 2015-12-14: qty 5

## 2015-12-14 NOTE — Patient Instructions (Addendum)
Please follow up with Dr Hilarie Fredrickson in 6 months.  Continue Entyvio. Your next infusion is scheduled for 01/11/16. You will then start having infusions every 8 weeks thereafter.  Continue Cymbalta 60 mg daily.

## 2015-12-14 NOTE — Progress Notes (Signed)
Subjective:    Patient ID: Meghan Charles, female    DOB: 1971-03-14, 44 y.o.   MRN: [id number]  HPI Meghan Charles is a 44 year old female with history of Crohn's enteritis status post segmental small bowel resection in May 2016, latent TB on isoniazid is here for follow-up. She's here alone today. Was last seen in October 2016. She reports she is doing very well. She had her second induction dose of Entyvio this morning. Initial dose was 2 weeks ago. These of been very well tolerated. Energy levels have improved dramatically. No abdominal pain. No nausea or vomiting. No fever or chills. She is working 2 jobs total her job at [employer] in [place] is on leave as the students are away for winter vacation. One time several weeks ago after eating kidney beans and rice she had some very transient right lower quadrant abdominal pain. She's been introducing high-fiber foods very slowly. She now thinks she cannot tolerate kidney beans. She is able to eat salads without problems.  Anxiety has improved dramatically. Good response to Cymbalta which he is taking 60 mg daily.  She was home in [place] with family for the Christmas break and had a great holiday.  She continues isoniazid for approximately 2-1/2 additional months plus supplemental B6 No cough, dyspnea, sore throat, or chest pain  Review of Systems As per history of present illness, otherwise negative  Current Medications, Allergies, Past Medical History, Past Surgical History, Family History and Social History were reviewed in [employer] record.     Objective:   Physical Exam BP 124/72 mmHg  Pulse 80  Ht 5\' 6"  (1.676 m)  Wt 147 lb (66.679 kg)  BMI 23.74 kg/m2 Constitutional: Well-developed and well-nourished. No distress. HEENT: Normocephalic and atraumatic. Oropharynx is clear and moist. No oropharyngeal exudate. Conjunctivae are normal.  No scleral icterus. Neck: Neck supple. Trachea  midline. Cardiovascular: Normal rate, regular rhythm and intact distal pulses. No M/R/G Pulmonary/chest: Effort normal and breath sounds normal. No wheezing, rales or rhonchi. Abdominal: Soft, nontender, nondistended. Bowel sounds active throughout. There are no masses palpable. No hepatosplenomegaly. Extremities: no clubbing, cyanosis, or edema Lymphadenopathy: No cervical adenopathy noted. Neurological: Alert and oriented to person place and time. Skin: Skin is warm and dry. No rashes noted. Psychiatric: Normal mood and affect. Behavior is normal.  CBC    Component Value Date/Time   WBC 13.9* 10/11/2015 0935   RBC 4.59 10/11/2015 0935   RBC 3.91 11/05/2012 0837   HGB 14.1 10/11/2015 0935   HCT 42.0 10/11/2015 0935   PLT 391 10/11/2015 0935   MCV 91.5 10/11/2015 0935   MCH 30.7 10/11/2015 0935   MCHC 33.6 10/11/2015 0935   RDW 13.1 10/11/2015 0935   LYMPHSABS 2.3 08/10/2015 1012   MONOABS 0.7 08/10/2015 1012   EOSABS 0.1 08/10/2015 1012   BASOSABS 0.0 08/10/2015 1012    CMP     Component Value Date/Time   NA 143 10/11/2015 0935   K 4.2 10/11/2015 0935   CL 109 10/11/2015 0935   CO2 25 10/11/2015 0935   GLUCOSE 119* 10/11/2015 0935   BUN 22* 10/11/2015 0935   CREATININE 0.61 10/11/2015 0935   CALCIUM 9.4 10/11/2015 0935   PROT 9.0* 10/11/2015 0935   ALBUMIN 4.7 10/11/2015 0935   AST 18 10/11/2015 0935   ALT 19 10/11/2015 0935   ALKPHOS 90 10/11/2015 0935   BILITOT 0.5 10/11/2015 0935   GFRNONAA >60 10/11/2015 0935   GFRAA >60 10/11/2015 0935  Assessment & Plan:  44 year old female with history of Crohn's enteritis status post segmental small bowel resection in May 2016, latent TB on isoniazid is here for follow-up.   1. Ileal Crohn's disease status post segmental small bowel resection -- it is my impression that she has surgical cure and is doing well. Meghan Charles is being prescribed to prevent recurrence of her stricturing Crohn's disease. She is doing well  today. She has tolerated the first 2 doses of Entyvio without problems. Her second dose is scheduled for 01/11/2016. Thereafter, I would like her to receive doses every 8 weeks. Continue to advance diet as tolerated. Activity levels have returned to normal. Follow-up for this issue in 6 months, sooner if necessary  2. Latent TB -- on isoniazid therapy under the direction of Dr. Linus Charles with ID, 6 month therapy planned for which she has completed approximately half  3. Anxiety, medical condition associated -- very good response to Cymbalta. Continue 60 mg daily. Can continue as needed Ambien for sleep  25 minutes spent with the patient today. Greater than 50% was spent in counseling and coordination of care with the patient

## 2015-12-24 ENCOUNTER — Other Ambulatory Visit: Payer: Self-pay

## 2015-12-24 DIAGNOSIS — K5090 Crohn's disease, unspecified, without complications: Secondary | ICD-10-CM

## 2015-12-28 ENCOUNTER — Ambulatory Visit: Payer: BLUE CROSS/BLUE SHIELD | Admitting: Internal Medicine

## 2015-12-30 ENCOUNTER — Telehealth: Payer: Self-pay | Admitting: Internal Medicine

## 2016-01-07 ENCOUNTER — Encounter (HOSPITAL_COMMUNITY): Payer: BLUE CROSS/BLUE SHIELD

## 2016-01-10 ENCOUNTER — Other Ambulatory Visit: Payer: Self-pay

## 2016-01-10 DIAGNOSIS — K50919 Crohn's disease, unspecified, with unspecified complications: Secondary | ICD-10-CM

## 2016-01-11 ENCOUNTER — Ambulatory Visit (HOSPITAL_COMMUNITY): Admission: RE | Admit: 2016-01-11 | Payer: BLUE CROSS/BLUE SHIELD | Source: Ambulatory Visit

## 2016-01-14 ENCOUNTER — Emergency Department (HOSPITAL_COMMUNITY)
Admission: EM | Admit: 2016-01-14 | Discharge: 2016-01-14 | Disposition: A | Discharge status: Home / Self Care | Payer: BLUE CROSS/BLUE SHIELD | Attending: Emergency Medicine | Admitting: Emergency Medicine

## 2016-01-14 ENCOUNTER — Encounter (HOSPITAL_COMMUNITY): Payer: Self-pay | Admitting: Emergency Medicine

## 2016-01-14 DIAGNOSIS — Z8719 Personal history of other diseases of the digestive system: Secondary | ICD-10-CM | POA: Diagnosis not present

## 2016-01-14 DIAGNOSIS — R112 Nausea with vomiting, unspecified: Secondary | ICD-10-CM

## 2016-01-14 DIAGNOSIS — F419 Anxiety disorder, unspecified: Secondary | ICD-10-CM | POA: Insufficient documentation

## 2016-01-14 DIAGNOSIS — E876 Hypokalemia: Secondary | ICD-10-CM | POA: Diagnosis not present

## 2016-01-14 DIAGNOSIS — Z79899 Other long term (current) drug therapy: Secondary | ICD-10-CM | POA: Diagnosis not present

## 2016-01-14 DIAGNOSIS — Z86010 Personal history of colonic polyps: Secondary | ICD-10-CM | POA: Insufficient documentation

## 2016-01-14 LAB — URINE MICROSCOPIC-ADD ON

## 2016-01-14 LAB — CBC
HCT: 42.4 % (ref 36.0–46.0)
Hemoglobin: 14.1 g/dL (ref 12.0–15.0)
MCH: 29.8 pg (ref 26.0–34.0)
MCHC: 33.3 g/dL (ref 30.0–36.0)
MCV: 89.6 fL (ref 78.0–100.0)
PLATELETS: 387 10*3/uL (ref 150–400)
RBC: 4.73 MIL/uL (ref 3.87–5.11)
RDW: 13.4 % (ref 11.5–15.5)
WBC: 8.2 10*3/uL (ref 4.0–10.5)

## 2016-01-14 LAB — LIPASE, BLOOD: LIPASE: 40 U/L (ref 11–51)

## 2016-01-14 LAB — COMPREHENSIVE METABOLIC PANEL
ALBUMIN: 5.4 g/dL — AB (ref 3.5–5.0)
ALT: 19 U/L (ref 14–54)
AST: 28 U/L (ref 15–41)
Alkaline Phosphatase: 64 U/L (ref 38–126)
Anion gap: 13 (ref 5–15)
BILIRUBIN TOTAL: 0.9 mg/dL (ref 0.3–1.2)
BUN: 13 mg/dL (ref 6–20)
CHLORIDE: 101 mmol/L (ref 101–111)
CO2: 23 mmol/L (ref 22–32)
CREATININE: 0.86 mg/dL (ref 0.44–1.00)
Calcium: 10 mg/dL (ref 8.9–10.3)
GFR calc Af Amer: >60 mL/min (ref >60)
GLUCOSE: 116 mg/dL — AB (ref 65–99)
Potassium: 2.7 mmol/L — CL (ref 3.5–5.1)
Sodium: 137 mmol/L (ref 135–145)
Total Protein: 9.3 g/dL — ABNORMAL HIGH (ref 6.5–8.1)

## 2016-01-14 LAB — DIFFERENTIAL
Basophils Absolute: 0 10*3/uL (ref 0.0–0.1)
Basophils Relative: 1 %
EOS PCT: 1 %
Eosinophils Absolute: 0.1 10*3/uL (ref 0.0–0.7)
LYMPHS ABS: 2.5 10*3/uL (ref 0.7–4.0)
LYMPHS PCT: 30 %
MONO ABS: 0.6 10*3/uL (ref 0.1–1.0)
Monocytes Relative: 7 %
NEUTROS ABS: 5.1 10*3/uL (ref 1.7–7.7)
Neutrophils Relative %: 61 %

## 2016-01-14 LAB — URINALYSIS, ROUTINE W REFLEX MICROSCOPIC
BILIRUBIN URINE: NEGATIVE
GLUCOSE, UA: NEGATIVE mg/dL
KETONES UR: NEGATIVE mg/dL
Nitrite: NEGATIVE
PH: 6.5 (ref 5.0–8.0)
PROTEIN: 30 mg/dL — AB
Specific Gravity, Urine: 1.015 (ref 1.005–1.030)

## 2016-01-14 LAB — I-STAT CG4 LACTIC ACID, ED: LACTIC ACID, VENOUS: 0.71 mmol/L (ref 0.5–2.0)

## 2016-01-14 MED ORDER — SODIUM CHLORIDE 0.9 % IV BOLUS (SEPSIS)
1000.0000 mL | Freq: Once | INTRAVENOUS | Status: AC
  Administered 2016-01-14: 1000 mL via INTRAVENOUS

## 2016-01-14 MED ORDER — METOCLOPRAMIDE HCL 10 MG PO TABS: 10.0000 mg | ORAL_TABLET | Freq: Four times a day (QID) | ORAL | Status: DC | PRN

## 2016-01-14 MED ORDER — SODIUM CHLORIDE 0.9 % IV SOLN
1000.0000 mL | INTRAVENOUS | Status: DC
  Administered 2016-01-14: 1000 mL via INTRAVENOUS

## 2016-01-14 MED ORDER — METOCLOPRAMIDE HCL 5 MG/ML IJ SOLN
10.0000 mg | Freq: Once | INTRAMUSCULAR | Status: AC
  Administered 2016-01-14: 10 mg via INTRAVENOUS
  Filled 2016-01-14: qty 2

## 2016-01-14 MED ORDER — POTASSIUM CHLORIDE CRYS ER 20 MEQ PO TBCR
40.0000 meq | EXTENDED_RELEASE_TABLET | Freq: Once | ORAL | Status: AC
  Administered 2016-01-14: 40 meq via ORAL
  Filled 2016-01-14: qty 2

## 2016-01-14 MED ORDER — POTASSIUM CHLORIDE CRYS ER 20 MEQ PO TBCR: 40.0000 meq | EXTENDED_RELEASE_TABLET | Freq: Two times a day (BID) | ORAL | Status: DC

## 2016-01-14 MED ORDER — SODIUM CHLORIDE 0.9 % IV SOLN
1000.0000 mL | Freq: Once | INTRAVENOUS | Status: AC
  Administered 2016-01-14: 1000 mL via INTRAVENOUS

## 2016-01-14 MED ORDER — ONDANSETRON HCL 4 MG/2ML IJ SOLN
4.0000 mg | Freq: Once | INTRAMUSCULAR | Status: AC
  Administered 2016-01-14: 4 mg via INTRAVENOUS
  Filled 2016-01-14: qty 2

## 2016-01-14 MED ORDER — DIPHENHYDRAMINE HCL 50 MG/ML IJ SOLN
25.0000 mg | Freq: Once | INTRAMUSCULAR | Status: AC
  Administered 2016-01-14: 25 mg via INTRAVENOUS
  Filled 2016-01-14: qty 1

## 2016-01-14 MED ORDER — POTASSIUM CHLORIDE 10 MEQ/100ML IV SOLN
10.0000 meq | Freq: Once | INTRAVENOUS | Status: AC
  Administered 2016-01-14: 10 meq via INTRAVENOUS
  Filled 2016-01-14: qty 100

## 2016-01-14 NOTE — ED Notes (Signed)
Patient here with complaints of nausea, vomiting since yesterday morning. Hx Crohn's disease.

## 2016-01-14 NOTE — Discharge Instructions (Signed)
Nausea and Vomiting Nausea is a sick feeling that often comes before throwing up (vomiting). Vomiting is a reflex where stomach contents come out of your mouth. Vomiting can cause severe loss of body fluids (dehydration). Children and elderly adults can become dehydrated quickly, especially if they also have diarrhea. Nausea and vomiting are symptoms of a condition or disease. It is important to find the cause of your symptoms. CAUSES   Direct irritation of the stomach lining. This irritation can result from increased acid production (gastroesophageal reflux disease), infection, food poisoning, taking certain medicines (such as nonsteroidal anti-inflammatory drugs), alcohol use, or tobacco use.  Signals from the brain.These signals could be caused by a headache, heat exposure, an inner ear disturbance, increased pressure in the brain from injury, infection, a tumor, or a concussion, pain, emotional stimulus, or metabolic problems.  An obstruction in the gastrointestinal tract (bowel obstruction).  Illnesses such as diabetes, hepatitis, gallbladder problems, appendicitis, kidney problems, cancer, sepsis, atypical symptoms of a heart attack, or eating disorders.  Medical treatments such as chemotherapy and radiation.  Receiving medicine that makes you sleep (general anesthetic) during surgery. DIAGNOSIS Your caregiver may ask for tests to be done if the problems do not improve after a few days. Tests may also be done if symptoms are severe or if the reason for the nausea and vomiting is not clear. Tests may include:  Urine tests.  Blood tests.  Stool tests.  Cultures (to look for evidence of infection).  X-rays or other imaging studies. Test results can help your caregiver make decisions about treatment or the need for additional tests. TREATMENT You need to stay well hydrated. Drink frequently but in small amounts.You may wish to drink water, sports drinks, clear broth, or eat frozen  ice pops or gelatin dessert to help stay hydrated.When you eat, eating slowly may help prevent nausea.There are also some antinausea medicines that may help prevent nausea. HOME CARE INSTRUCTIONS   Take all medicine as directed by your caregiver.  If you do not have an appetite, do not force yourself to eat. However, you must continue to drink fluids.  If you have an appetite, eat a normal diet unless your caregiver tells you differently.  Eat a variety of complex carbohydrates (rice, wheat, potatoes, bread), lean meats, yogurt, fruits, and vegetables.  Avoid high-fat foods because they are more difficult to digest.  Drink enough water and fluids to keep your urine clear or pale yellow.  If you are dehydrated, ask your caregiver for specific rehydration instructions. Signs of dehydration may include:  Severe thirst.  Dry lips and mouth.  Dizziness.  Dark urine.  Decreasing urine frequency and amount.  Confusion.  Rapid breathing or pulse. SEEK IMMEDIATE MEDICAL CARE IF:   You have blood or brown flecks (like coffee grounds) in your vomit.  You have black or bloody stools.  You have a severe headache or stiff neck.  You are confused.  You have severe abdominal pain.  You have chest pain or trouble breathing.  You do not urinate at least once every 8 hours.  You develop cold or clammy skin.  You continue to vomit for longer than 24 to 48 hours.  You have a fever. MAKE SURE YOU:   Understand these instructions.  Will watch your condition.  Will get help right away if you are not doing well or get worse.   This information is not intended to replace advice given to you by your health care provider. Make sure  you discuss any questions you have with your health care provider.   Document Released: 12/04/2005 Document Revised: 02/26/2012 Document Reviewed: 05/03/2011 Elsevier Interactive Patient Education 2016 [employer].  Hypokalemia Hypokalemia means  that the amount of potassium in the blood is lower than normal.Potassium is a chemical, called an electrolyte, that helps regulate the amount of fluid in the body. It also stimulates muscle contraction and helps nerves function properly.Most of the body's potassium is inside of cells, and only a very small amount is in the blood. Because the amount in the blood is so small, minor changes can be life-threatening. CAUSES  Antibiotics.  Diarrhea or vomiting.  Using laxatives too much, which can cause diarrhea.  Chronic kidney disease.  Water pills (diuretics).  Eating disorders (bulimia).  Low magnesium level.  Sweating a lot. SIGNS AND SYMPTOMS  Weakness.  Constipation.  Fatigue.  Muscle cramps.  Mental confusion.  Skipped heartbeats or irregular heartbeat (palpitations).  Tingling or numbness. DIAGNOSIS  Your health care provider can diagnose hypokalemia with blood tests. In addition to checking your potassium level, your health care provider may also check other lab tests. TREATMENT Hypokalemia can be treated with potassium supplements taken by mouth or adjustments in your current medicines. If your potassium level is very low, you may need to get potassium through a vein (IV) and be monitored in the hospital. A diet high in potassium is also helpful. Foods high in potassium are:  Nuts, such as peanuts and pistachios.  Seeds, such as sunflower seeds and pumpkin seeds.  Peas, lentils, and lima beans.  Whole grain and bran cereals and breads.  Fresh fruit and vegetables, such as apricots, avocado, bananas, cantaloupe, kiwi, oranges, tomatoes, asparagus, and potatoes.  Orange and tomato juices.  Red meats.  Fruit yogurt. HOME CARE INSTRUCTIONS  Take all medicines as prescribed by your health care provider.  Maintain a healthy diet by including nutritious food, such as fruits, vegetables, nuts, whole grains, and lean meats.  If you are taking a laxative, be  sure to follow the directions on the label. SEEK MEDICAL CARE IF:  Your weakness gets worse.  You feel your heart pounding or racing.  You are vomiting or having diarrhea.  You are diabetic and having trouble keeping your blood glucose in the normal range. SEEK IMMEDIATE MEDICAL CARE IF:  You have chest pain, shortness of breath, or dizziness.  You are vomiting or having diarrhea for more than 2 days.  You faint. MAKE SURE YOU:   Understand these instructions.  Will watch your condition.  Will get help right away if you are not doing well or get worse.   This information is not intended to replace advice given to you by your health care provider. Make sure you discuss any questions you have with your health care provider.   Document Released: 12/04/2005 Document Revised: 12/25/2014 Document Reviewed: 06/06/2013 Elsevier Interactive Patient Education 2016 Elsevier Inc.  Metoclopramide tablets What is this medicine? METOCLOPRAMIDE (met oh kloe PRA mide) is used to treat the symptoms of gastroesophageal reflux disease (GERD) like heartburn. It is also used to treat people with slow emptying of the stomach and intestinal tract. This medicine may be used for other purposes; ask your health care provider or pharmacist if you have questions. What should I tell my health care provider before I take this medicine? They need to know if you have any of these conditions: -breast cancer -depression -diabetes -heart failure -high blood pressure -kidney disease -liver disease -  Parkinson's disease or a movement disorder -pheochromocytoma -seizures -stomach obstruction, bleeding, or perforation -an unusual or allergic reaction to metoclopramide, procainamide, sulfites, other medicines, foods, dyes, or preservatives -pregnant or trying to get pregnant -breast-feeding How should I use this medicine? Take this medicine by mouth with a glass of water. Follow the directions on the  prescription label. Take this medicine on an empty stomach, about 30 minutes before eating. Take your doses at regular intervals. Do not take your medicine more often than directed. Do not stop taking except on the advice of your doctor or health care professional. A special MedGuide will be given to you by the pharmacist with each prescription and refill. Be sure to read this information carefully each time. Talk to your pediatrician regarding the use of this medicine in children. Special care may be needed. Overdosage: If you think you have taken too much of this medicine contact a poison control center or emergency room at once. NOTE: This medicine is only for you. Do not share this medicine with others. What if I miss a dose? If you miss a dose, take it as soon as you can. If it is almost time for your next dose, take only that dose. Do not take double or extra doses. What may interact with this medicine? -acetaminophen -cyclosporine -digoxin -medicines for blood pressure -medicines for diabetes, including insulin -medicines for hay fever and other allergies -medicines for depression, especially an Monoamine Oxidase Inhibitor (MAOI) -medicines for Parkinson's disease, like levodopa -medicines for sleep or for pain -tetracycline This list may not describe all possible interactions. Give your health care provider a list of all the medicines, herbs, non-prescription drugs, or dietary supplements you use. Also tell them if you smoke, drink alcohol, or use illegal drugs. Some items may interact with your medicine. What should I watch for while using this medicine? It may take a few weeks for your stomach condition to start to get better. However, do not take this medicine for longer than 12 weeks. The longer you take this medicine, and the more you take it, the greater your chances are of developing serious side effects. If you are an elderly patient, a female patient, or you have diabetes, you  may be at an increased risk for side effects from this medicine. Contact your doctor immediately if you start having movements you cannot control such as lip smacking, rapid movements of the tongue, involuntary or uncontrollable movements of the eyes, head, arms and legs, or muscle twitches and spasms. Patients and their families should watch out for worsening depression or thoughts of suicide. Also watch out for any sudden or severe changes in feelings such as feeling anxious, agitated, panicky, irritable, hostile, aggressive, impulsive, severely restless, overly excited and hyperactive, or not being able to sleep. If this happens, especially at the beginning of treatment or after a change in dose, call your doctor. Do not treat yourself for high fever. Ask your doctor or health care professional for advice. You may get drowsy or dizzy. Do not drive, use machinery, or do anything that needs mental alertness until you know how this drug affects you. Do not stand or sit up quickly, especially if you are an older patient. This reduces the risk of dizzy or fainting spells. Alcohol can make you more drowsy and dizzy. Avoid alcoholic drinks. What side effects may I notice from receiving this medicine? Side effects that you should report to your doctor or health care professional as soon as possible: -allergic  reactions like skin rash, itching or hives, swelling of the face, lips, or tongue -abnormal production of milk in females -breast enlargement in both males and females -change in the way you walk -difficulty moving, speaking or swallowing -drooling, lip smacking, or rapid movements of the tongue -excessive sweating -fever -involuntary or uncontrollable movements of the eyes, head, arms and legs -irregular heartbeat or palpitations -muscle twitches and spasms -unusually weak or tired Side effects that usually do not require medical attention (report to your doctor or health care professional if they  continue or are bothersome): -change in sex drive or performance -depressed mood -diarrhea -difficulty sleeping -headache -menstrual changes -restless or nervous This list may not describe all possible side effects. Call your doctor for medical advice about side effects. You may report side effects to FDA at 1-800-FDA-1088. Where should I keep my medicine? Keep out of the reach of children. Store at room temperature between 20 and 25 degrees C (68 and 77 degrees F). Protect from light. Keep container tightly closed. Throw away any unused medicine after the expiration date. NOTE: This sheet is a summary. It may not cover all possible information. If you have questions about this medicine, talk to your doctor, pharmacist, or health care provider.    2016, Elsevier/Gold Standard. (2012-04-02 13:04:38)  Potassium Salts tablets, extended-release tablets or capsules What is this medicine? POTASSIUM (poe TASS i um) is a natural salt that is important for the heart, muscles, and nerves. It is found in many foods and is normally supplied by a well balanced diet. This medicine is used to treat low potassium. This medicine may be used for other purposes; ask your health care provider or pharmacist if you have questions. What should I tell my health care provider before I take this medicine? They need to know if you have any of these conditions: -Addison's disease -dehydration -diabetes -difficulty swallowing -heart disease -history of high levels of potassium in the blood -irregular heartbeat -kidney disease -recent severe burn -stomach ulcers or other stomach problems -an unusual or allergic reaction to potassium, tartrazine, other medicines, foods, dyes, or preservatives -pregnant or trying to get pregnant -breast-feeding How should I use this medicine? Take this medicine by mouth with a full glass of water. Take with food. Follow the directions on the prescription label. Do not suck on,  crush, or chew this medicine. If you have difficulty swallowing, ask the pharmacist how to take. Take your medicine at regular intervals. Do not take it more often than directed. Do not stop taking except on your doctor's advice. Talk to your pediatrician regarding the use of this medicine in children. Special care may be needed. Overdosage: If you think you have taken too much of this medicine contact a poison control center or emergency room at once. NOTE: This medicine is only for you. Do not share this medicine with others. What if I miss a dose? If you miss a dose, take it as soon as you can. If it is almost time for your next dose, take only that dose. Do not take double or extra doses. What may interact with this medicine? Do not take this medicine with any of the following medications: -eplerenone -certain medicines for stomach problems like atropine; difenoxin and glycopyrrolate -sodium polystyrene sulfonate This medicine may also interact with the following medications: -certain medicines for blood pressure or heart disease like lisinopril, losartan, quinapril, valsartan -medicines for cold or allergies -NSAIDs, medicines for pain and inflammation, like ibuprofen or napoxen -other  potassium supplements -salt substitutes -some diuretics This list may not describe all possible interactions. Give your health care provider a list of all the medicines, herbs, non-prescription drugs, or dietary supplements you use. Also tell them if you smoke, drink alcohol, or use illegal drugs. Some items may interact with your medicine. What should I watch for while using this medicine? Visit your doctor or health care professional for regular check ups. You will need lab work done regularly. You may need to be on a special diet while taking this medicine. Ask your doctor. What side effects may I notice from receiving this medicine? Side effects that you should report to your doctor or health care  professional as soon as possible: -allergic reactions like skin rash, itching or hives, swelling of the face, lips, or tongue -anxious -black, tarry stools -breathing problems -confusion -heartburn -irregular heartbeat -numbness or tingling in hands or feet -pain when swallowing -unusually weak or tired -weakness, heaviness of legs Side effects that usually do not require medical attention (report to your doctor or health care professional if they continue or are bothersome): -diarrhea -nausea -upset stomach -vomiting This list may not describe all possible side effects. Call your doctor for medical advice about side effects. You may report side effects to FDA at 1-800-FDA-1088. Where should I keep my medicine? Keep out of the reach of children. Store at room temperature between 15 and 30 degrees C (59 and 86 degrees F ). Keep bottle closed tightly to protect this medicine from light and moisture. Throw away any unused medicine after the expiration date. NOTE: This sheet is a summary. It may not cover all possible information. If you have questions about this medicine, talk to your doctor, pharmacist, or health care provider.    2016, Elsevier/Gold Standard. (2015-05-13 08:55:21)

## 2016-01-14 NOTE — ED Notes (Signed)
MD at bedside. 

## 2016-01-14 NOTE — ED Notes (Addendum)
Roxanne Mins, MD made aware of patient's potassium level. Patient hooked up to cardiac monitor.

## 2016-01-14 NOTE — ED Provider Notes (Signed)
CSN: [id number]     Arrival date & time 01/14/16  0409 History   First MD Initiated Contact with Patient 01/14/16 0411     Chief Complaint  Patient presents with  . Emesis     (Consider location/radiation/quality/duration/timing/severity/associated sxs/prior Treatment) The history is provided by the patient.   45 year old female history of Crohn's disease comes in with onset yesterday of nausea and vomiting. She has not been able to hold anything down. She has also had some movements which are looser than normal but not overt diarrhea. She denies fever, chills, sweats. She denies any sick contacts. She has not had anything at home with which to treat her nausea. She is complaining some soreness in the right side of the abdomen but no true abdominal pain.  Past Medical History  Diagnosis Date  . Gallbladder polyp   . Anxiety   . Colon polyps 05/2014    TUBULAR ADENOMA AND HYPERPLASTIC POLYP.  . Crohn's disease ([place])   . Tubular adenoma of colon    Past Surgical History  Procedure Laterality Date  . Other surgical history      heel surgery  . Wisdom teeth extracted    . Right heel surgery      with plates and screws  . Colonoscopy w/ biopsies  2015  . Colon resection N/A 05/11/2015    Procedure: Laparotomy with resection of Chron's Disease small bowel resection biopsy mesenteric nodule;  Surgeon: Fanny Skates, MD;  Location: [facility];  Service: General;  Laterality: N/A;   Family History  Problem Relation Age of Onset  . Colon cancer Maternal Grandmother 62  . Esophageal cancer Neg Hx   . Stomach cancer Neg Hx   . Rectal cancer Neg Hx   . Pancreatic cancer Neg Hx    Social History  Substance Use Topics  . Smoking status: Never Smoker   . Smokeless tobacco: Never Used  . Alcohol Use: 0.0 oz/week    0 Standard drinks or equivalent per week     Comment: 3 times per week   OB History    No data available     Review of Systems  All other systems reviewed and are  negative.     Allergies  Morphine and related  Home Medications   Prior to Admission medications   Medication Sig Start Date End Date Taking? Authorizing Provider  Calcium Carbonate-Vitamin D (CALCIUM-D PO) Take 2 tablets by mouth daily.    Historical Provider, MD  DULoxetine (CYMBALTA) 60 MG capsule Take 1 capsule (60 mg total) by mouth daily. 10/12/15   Jerene Bears, MD  isoniazid (NYDRAZID) 300 MG tablet Take 1 tablet (300 mg total) by mouth daily. Patient taking differently: Take 300 mg by mouth daily. For 2 months 9-19 to 11-18 09/06/15   Thayer Headings, MD  vitamin B-6 (PYRIDOXINE) 25 MG tablet Take 1 tablet (25 mg total) by mouth daily. 09/06/15   Thayer Headings, MD  zolpidem (AMBIEN) 10 MG tablet TAKE ONE TABLET BY MOUTH AS NEEDED AT BEDTIME 12/06/15   Jerene Bears, MD   BP 186/120 mmHg  Pulse 114  Temp(Src) 97.6 F (36.4 C) (Oral)  Resp 16  SpO2 98% Physical Exam  Nursing note and vitals reviewed.  45 year old female, resting comfortably and in no acute distress. Vital signs are significant for hypertension and tachycardia. Oxygen saturation is 98%, which is normal. Head is normocephalic and atraumatic. PERRLA, EOMI. Oropharynx is clear. Neck is nontender and supple  without adenopathy or JVD. Back is nontender and there is no CVA tenderness. Lungs are clear without rales, wheezes, or rhonchi. Chest is nontender. Heart has regular rate and rhythm without murmur. Abdomen is soft, flat, with mild tenderness in the right upper quadrant and right lower quadrant. There is no rebound or guarding. There are no masses or hepatosplenomegaly and peristalsis is hypoactive. Extremities have no cyanosis or edema, full range of motion is present. Skin is warm and dry without rash. Neurologic: Mental status is normal, cranial nerves are intact, there are no motor or sensory deficits.  ED Course  Procedures (including critical care time) Labs Review Results for orders placed or  performed during the hospital encounter of 01/14/16  Lipase, blood  Result Value Ref Range   Lipase 40 11 - 51 U/L  Comprehensive metabolic panel  Result Value Ref Range   Sodium 137 135 - 145 mmol/L   Potassium 2.7 (LL) 3.5 - 5.1 mmol/L   Chloride 101 101 - 111 mmol/L   CO2 23 22 - 32 mmol/L   Glucose, Bld 116 (H) 65 - 99 mg/dL   BUN 13 6 - 20 mg/dL   Creatinine, Ser 0.86 0.44 - 1.00 mg/dL   Calcium 10.0 8.9 - 10.3 mg/dL   Total Protein 9.3 (H) 6.5 - 8.1 g/dL   Albumin 5.4 (H) 3.5 - 5.0 g/dL   AST 28 15 - 41 U/L   ALT 19 14 - 54 U/L   Alkaline Phosphatase 64 38 - 126 U/L   Total Bilirubin 0.9 0.3 - 1.2 mg/dL   GFR calc non Af Amer >60 >60 mL/min   GFR calc Af Amer >60 >60 mL/min   Anion gap 13 5 - 15  CBC  Result Value Ref Range   WBC 8.2 4.0 - 10.5 K/uL   RBC 4.73 3.87 - 5.11 MIL/uL   Hemoglobin 14.1 12.0 - 15.0 g/dL   HCT 42.4 36.0 - 46.0 %   MCV 89.6 78.0 - 100.0 fL   MCH 29.8 26.0 - 34.0 pg   MCHC 33.3 30.0 - 36.0 g/dL   RDW 13.4 11.5 - 15.5 %   Platelets 387 150 - 400 K/uL  Urinalysis, Routine w reflex microscopic (not at [facility])  Result Value Ref Range   Color, Urine YELLOW YELLOW   APPearance CLOUDY (A) CLEAR   Specific Gravity, Urine 1.015 1.005 - 1.030   pH 6.5 5.0 - 8.0   Glucose, UA NEGATIVE NEGATIVE mg/dL   Hgb urine dipstick LARGE (A) NEGATIVE   Bilirubin Urine NEGATIVE NEGATIVE   Ketones, ur NEGATIVE NEGATIVE mg/dL   Protein, ur 30 (A) NEGATIVE mg/dL   Nitrite NEGATIVE NEGATIVE   Leukocytes, UA TRACE (A) NEGATIVE  Differential  Result Value Ref Range   Neutrophils Relative % 61 %   Neutro Abs 5.1 1.7 - 7.7 K/uL   Lymphocytes Relative 30 %   Lymphs Abs 2.5 0.7 - 4.0 K/uL   Monocytes Relative 7 %   Monocytes Absolute 0.6 0.1 - 1.0 K/uL   Eosinophils Relative 1 %   Eosinophils Absolute 0.1 0.0 - 0.7 K/uL   Basophils Relative 1 %   Basophils Absolute 0.0 0.0 - 0.1 K/uL  Urine microscopic-add on  Result Value Ref Range   Squamous Epithelial / LPF  0-5 (A) NONE SEEN   WBC, UA 0-5 0 - 5 WBC/hpf   RBC / HPF 6-30 0 - 5 RBC/hpf   Bacteria, UA MANY (A) NONE SEEN   I have personally  reviewed and evaluated these images and lab results as part of my medical decision-making.  MDM   Final diagnoses:  Nausea and vomiting, vomiting of unspecified type  Hypokalemia    Nausea and vomiting which could be related to her Crohn's disease, possible viral gastritis. Patient does not appear toxic but she is tachycardic. She will be given IV hydration and ondansetron for nausea. On review of old records, she has several visits for Crohn's disease and also ED visits for nausea and vomiting.  She had only partial relief of nausea after a dose of ondansetron. She is given metoclopramide with excellent relief of nausea. Potassium is noted to be very low and she was given intravenous and oral potassium. Repeat lactic acid level is normal. She is discharged with prescription for metoclopramide and K-Dur.  Delora Fuel, MD [id number]

## 2016-01-18 ENCOUNTER — Encounter (HOSPITAL_COMMUNITY)
Admission: RE | Admit: 2016-01-18 | Discharge: 2016-01-18 | Disposition: A | Discharge status: Home / Self Care | Payer: BLUE CROSS/BLUE SHIELD | Source: Ambulatory Visit | Attending: Internal Medicine | Admitting: Internal Medicine

## 2016-01-18 ENCOUNTER — Telehealth: Payer: Self-pay

## 2016-01-18 ENCOUNTER — Encounter (HOSPITAL_COMMUNITY): Payer: Self-pay

## 2016-01-18 VITALS — BP 164/110 | HR 87 | Temp 98.2°F | Resp 18 | Ht 66.0 in | Wt 147.0 lb

## 2016-01-18 DIAGNOSIS — K50919 Crohn's disease, unspecified, with unspecified complications: Secondary | ICD-10-CM | POA: Insufficient documentation

## 2016-01-18 MED ORDER — VEDOLIZUMAB 300 MG IV SOLR
300.0000 mg | Freq: Once | INTRAVENOUS | Status: DC
  Filled 2016-01-18: qty 5

## 2016-01-18 MED ORDER — SODIUM CHLORIDE 0.9 % IV SOLN
INTRAVENOUS | Status: DC
  Administered 2016-01-18: 11:00:00 via INTRAVENOUS

## 2016-01-18 NOTE — Progress Notes (Signed)
  January 18, 2016  Patient: Meghan Charles  Date of Birth: 1971/07/28  Date of Visit: 01/18/2016    To Whom It May Concern:  [name] was seen and treated in our rshort stay department on 01/18/2016. And was released at 1 ;15 pm  Sincerely,  Beatris Si RN

## 2016-01-18 NOTE — Progress Notes (Addendum)
Uneventful infusion of Entyvio today. BP has been elevated while here today ( as it was at her recent ED visit). Pt c/o mild headache but otherwise voices no c/o. Pt is becoming more anxious at repeated BP check. Pt states he has no PCP at this time. Placed called to Dr Hilarie Fredrickson office and spoke with Rosanne Sack RN who reported this to Dr Henrene Pastor. Pt is to go home and monitor her BP and if she has any problems she is to go to [facility]. Pt verbalized understanding and voicing no c/o and eager to go.

## 2016-01-18 NOTE — Progress Notes (Signed)
Pt arrived today for week 6 of Induction to Entyvio. Pt states she was in ED with Nausea and vomiting on 01/13/16. Received hydration fluids and was prescribed postasium supplements. States she feel fine today. Spoke with Rosanne Sack RN for Dr Hilarie Fredrickson and after she spoke with him patient is OK to receive her Entyvio today.  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

## 2016-01-18 NOTE — Telephone Encounter (Signed)
Received call from Short stay at [facility] pt was in er several days ago for nausea and vomiting. Pt feels better and has no fever or N&V today. Per Dr. Hilarie Fredrickson ok to proceed with entyvio. Short stay aware.

## 2016-01-18 NOTE — Telephone Encounter (Signed)
Received call from Short stay that pts BP is elevated. Nurse states that it was elevated when the pt was in the ER. Nurse states pt is very anxious. Discussed with Dr. Hilarie Fredrickson, pt ok for d/c and can monitor BP. Pt to be instructed to go to urgent care if she notices BP remaining up or she feels like it is up.

## 2016-02-11 MED FILL — Vedolizumab For IV Solution 300 MG: INTRAVENOUS | Qty: 5 | Status: AC

## 2016-03-03 ENCOUNTER — Telehealth: Payer: Self-pay | Admitting: *Deleted

## 2016-03-03 MED ORDER — ONDANSETRON HCL 8 MG PO TABS: 4.0000 mg | ORAL_TABLET | Freq: Three times a day (TID) | ORAL | Status: DC | PRN

## 2016-03-03 NOTE — Telephone Encounter (Signed)
No openings for today.  She will come see [name] PA on Monday at 2:00.  Rx for zofran sent in.

## 2016-03-03 NOTE — Telephone Encounter (Signed)
Chart reviewed. Is she having any abdominal pain, concerning for bowel obstruction? Are these her typical Crohns symptoms or is this new? Sounds like she is moving her bowels and would not seem obstructed, but please clarify. If she can be seen in clinic this afternoon by one of the PAs that would be good. If no openings, we can give her some zofran 4 to 8mg  every 8 hours to take heading into the weekend as needed. I would not recommend Reglan. If she is having abdominal pain and vomiting concerning for obstruction (she has had this in the past) or if she cannot tolerate anything PO she will need to go to the ER. She should try just a liquid diet today and see if she tolerates this better.

## 2016-03-03 NOTE — Telephone Encounter (Signed)
Having flare up symptoms

## 2016-03-03 NOTE — Telephone Encounter (Signed)
Dr. [name] patient with a history of crohn's maintained on Entyvio.  Her next infusion is due 03/15/16.  She reports 2 weeks of worsening nausea and now with daily vomiting even on a bland diet.  She reports minimal diarrhea and cramping.  Denies urgency, rectal bleeding, or fever.  She was prescribed Reglan at an ER visit in January, but never picked it up.  Dr. Havery Moros you are MD of the day.  Please advise    Dr. [name] she is asking about a refill of Ambien. She reports she is not sleeping at all.  I advised her you are out of the office and will address the Ambien next week.

## 2016-03-06 ENCOUNTER — Encounter: Payer: Self-pay | Admitting: *Deleted

## 2016-03-06 ENCOUNTER — Other Ambulatory Visit (INDEPENDENT_AMBULATORY_CARE_PROVIDER_SITE_OTHER): Payer: BLUE CROSS/BLUE SHIELD

## 2016-03-06 ENCOUNTER — Ambulatory Visit (INDEPENDENT_AMBULATORY_CARE_PROVIDER_SITE_OTHER): Payer: BLUE CROSS/BLUE SHIELD | Admitting: Physician Assistant

## 2016-03-06 VITALS — BP 116/84 | HR 64 | Ht 66.0 in | Wt 153.5 lb

## 2016-03-06 DIAGNOSIS — K50019 Crohn's disease of small intestine with unspecified complications: Secondary | ICD-10-CM

## 2016-03-06 DIAGNOSIS — Z9049 Acquired absence of other specified parts of digestive tract: Secondary | ICD-10-CM

## 2016-03-06 DIAGNOSIS — Z9889 Other specified postprocedural states: Secondary | ICD-10-CM | POA: Diagnosis not present

## 2016-03-06 DIAGNOSIS — R1031 Right lower quadrant pain: Secondary | ICD-10-CM | POA: Diagnosis not present

## 2016-03-06 LAB — BASIC METABOLIC PANEL
BUN: 14 mg/dL (ref 6–23)
CALCIUM: 9.9 mg/dL (ref 8.4–10.5)
CHLORIDE: 103 meq/L (ref 96–112)
CO2: 29 meq/L (ref 19–32)
Creatinine, Ser: 0.74 mg/dL (ref 0.40–1.20)
GFR: 90.4 mL/min (ref >60.00)
Glucose, Bld: 98 mg/dL (ref 70–99)
POTASSIUM: 5.6 meq/L — AB (ref 3.5–5.1)
SODIUM: 137 meq/L (ref 135–145)

## 2016-03-06 LAB — CBC WITH DIFFERENTIAL/PLATELET
BASOS ABS: 0.1 10*3/uL (ref 0.0–0.1)
Basophils Relative: 1.3 % (ref 0.0–3.0)
EOS ABS: 0.1 10*3/uL (ref 0.0–0.7)
Eosinophils Relative: 1.7 % (ref 0.0–5.0)
HCT: 39.1 % (ref 36.0–46.0)
Hemoglobin: 13.3 g/dL (ref 12.0–15.0)
LYMPHS ABS: 2.1 10*3/uL (ref 0.7–4.0)
Lymphocytes Relative: 27.5 % (ref 12.0–46.0)
MCHC: 33.9 g/dL (ref 30.0–36.0)
MCV: 89 fl (ref 78.0–100.0)
MONO ABS: 0.6 10*3/uL (ref 0.1–1.0)
Monocytes Relative: 7.6 % (ref 3.0–12.0)
NEUTROS PCT: 61.9 % (ref 43.0–77.0)
Neutro Abs: 4.8 10*3/uL (ref 1.4–7.7)
Platelets: 332 10*3/uL (ref 150.0–400.0)
RBC: 4.4 Mil/uL (ref 3.87–5.11)
RDW: 13 % (ref 11.5–15.5)
WBC: 7.7 10*3/uL (ref 4.0–10.5)

## 2016-03-06 LAB — SEDIMENTATION RATE: Sed Rate: 18 mm/hr (ref 0–22)

## 2016-03-06 LAB — HIGH SENSITIVITY CRP: CRP HIGH SENSITIVITY: 0.5 mg/L (ref 0.000–5.000)

## 2016-03-06 MED ORDER — HYDROCODONE-ACETAMINOPHEN 5-325 MG PO TABS: ORAL_TABLET | ORAL | Status: DC

## 2016-03-06 MED ORDER — ZOLPIDEM TARTRATE 10 MG PO TABS
10.0000 mg | ORAL_TABLET | Freq: Every day | ORAL | Status: DC
Start: 2016-03-06 — End: 2016-04-25

## 2016-03-06 NOTE — Patient Instructions (Addendum)
Please go to the basement level to have your labs drawn.   You have been scheduled for an MR Enterography Abd & Pelvis at 7:45 am on Tuesday. Location is [address] [facility] beside [facility].  Please arrive at 6:30 am  prior to your appointment time for registration purposes. Please make certain not to have anything to eat or drink after midnight to your test. In addition, if you have any metal in your body, have a pacemaker or defibrillator, please be sure to let your ordering physician know. This test typically takes 45 minutes to 1 hour to complete.

## 2016-03-06 NOTE — Progress Notes (Addendum)
Patient ID: Meghan Charles, female   DOB: 1971-01-25, 45 y.o.   MRN: [id number]   Subjective:    Patient ID: Meghan Charles, female    DOB: [birth year], 45 y.o.   MRN: [id number]  HPI  Meghan Charles  is a 45 year old white female known to Dr.Pyrtle with history of Crohn's ileitis. She was last seen in the office in December 2016. 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 She was started on Entyvio in December 2016 to prevent recurrence of her stricturing Crohn's disease. Patient has completed induction with Entyvio and  had her last infusion  on 1/29/ 2017. She will be due for another infusion next week. She says she had been feeling well until towards the end of February when she had an episode of soreness in her right lower abdomen associated with nausea and vomiting and she says she had several episodes of vomiting and then felt better. But since that time he has had 5-6 episodes of nausea and vomiting associated with lower abdominal discomfort and bloating. In between these episodes she has had some ongoing soreness in the right lower quadrant which has not been severe. She has noted some intermittent diarrhea ,no bleeding. No fever chills or sweats. She has been eating without difficulty in between these episodes.  Review of Systems Pertinent positive and negative review of systems were noted in the above HPI section.  All other review of systems was otherwise negative.  Outpatient Encounter Prescriptions as of 03/06/2016  Medication Sig  . DULoxetine (CYMBALTA) 60 MG capsule Take 1 capsule (60 mg total) by mouth daily.  Marland Kitchen isoniazid (NYDRAZID) 300 MG tablet Take 1 tablet (300 mg total) by mouth daily.  . metoCLOPramide (REGLAN) 10 MG tablet Take 1 tablet (10 mg total) by mouth every 6 (six) hours as needed for nausea.  . ondansetron (ZOFRAN) 8 MG tablet Take 0.5 tablets (4 mg total) by  mouth every 8 (eight) hours as needed for nausea or vomiting.  . vitamin B-6 (PYRIDOXINE) 25 MG tablet Take 1 tablet (25 mg total) by mouth daily.  . [DISCONTINUED] naproxen sodium (ANAPROX) 220 MG tablet Take 220 mg by mouth 2 (two) times daily with a meal.  . [DISCONTINUED] potassium chloride SA (K-DUR,KLOR-CON) 20 MEQ tablet Take 2 tablets (40 mEq total) by mouth 2 (two) times daily.  . [DISCONTINUED] zolpidem (AMBIEN) 10 MG tablet TAKE ONE TABLET BY MOUTH AS NEEDED AT BEDTIME   No facility-administered encounter medications on file as of 03/06/2016.   Allergies  Allergen Reactions  . Morphine And Related Other (See Comments)    Headache   Patient Active Problem List   Diagnosis Date Noted  . 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 Acute bronchitis 07/05/2015  . Cough 07/05/2015  . Protein-calorie malnutrition, severe ([place]) 05/16/2015  . Ileus, postoperative   . RLQ abdominal pain   . Small bowel obstruction ([place]) 05/03/2015  . SBO (small bowel obstruction) ([place]) 05/03/2015  . Pollen allergies 03/26/2015  . Immunosuppressed status ([place]) 03/26/2015  . Exacerbation of Crohn's disease ([place])   . High risk medication use   . Crohn's disease ([place]) 01/18/2015  . Diarrhea 01/18/2015  . Crohn disease ([place])   . Crohn's colitis ([place]) 12/26/2014  . Essential hypertension   . CAP (community acquired pneumonia) 10/02/2014  . Tachycardia 10/02/2014  . Physical deconditioning 10/02/2014  . PNA (pneumonia) 09/24/2014  .  Pleural effusion 09/24/2014  . Nausea with vomiting 09/24/2014  . Leukocytosis 09/24/2014  . Sinus tachycardia ([place]) 09/24/2014  . Dehydration 09/24/2014  . Gastroesophageal reflux disease without esophagitis 09/01/2014  . Hx of adenomatous colonic polyps 09/01/2014  . Abdominal pain 11/05/2012  . 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 Nausea and vomiting in adult 11/05/2012  . Blood loss anemia 11/05/2012   Social History   Social History  . Marital Status: Single      Spouse Name: N/A  . Number of Children: N/A  . Years of Education: N/A   Occupational History  . Recruitment consultant    Social History Main Topics  . Smoking status: Never Smoker   . Smokeless tobacco: Never Used  . Alcohol Use: 0.0 oz/week    0 Standard drinks or equivalent per week     Comment: 3 times per week  . Drug Use: No  . Sexual Activity: No   Other Topics Concern  . Not on file   Social History Narrative    Meghan Charles family history includes Colon cancer (age of onset: 23) in her maternal grandmother. There is no history of Esophageal cancer, Stomach cancer, Rectal cancer, or Pancreatic cancer.      Objective:    Filed Vitals:   03/06/16 1357  BP: 116/84  Pulse: 64    Physical Exam   well-developed white female in no acute distress her she 116/84 pulse 64 height 5 foot 6 weight 153. HEENT; nontraumatic normocephalic EOMI PERRLA sclera anicteric, Cardiovascular; regular rate and rhythm with S1-S2 no murmur rub or gallop, Pulmonary; clear bilaterally, Abdomen; soft she is tender in the right lower quadrant there is no guarding or rebound no palpable mass or hepatosplenomegaly well-healed midline incisional scar, bowel sounds present Rectal ;exam not done, Extremities ;no clubbing cyanosis or edema skin warm and dry, Neuropsych; mood and affect appropriate     Assessment & Plan:   #1  45 yo female with hx of stricturing Crohns ileitis s/p resection  Mid ileum (12 inches) 04/2015. Now on Entyvio since 11/2015 . Presents with RLQ discomfort , and intermittent episodes of nausea and vomiting associated with lower abdominal discomfort and bloating over the past 5-6 weeks.  R/O recurrence of Crohn's ileitis, versus low-grade intermittent obstruction from adhesions or stricture. #2 anxiety #3 latent TB- completing 6 month course of isoniazid  Plan; Will check CBC, BMET, sedimentation rate and CRP Schedule for MR enterography abdomen and pelvis- Zofran 4 mg  every 6 hours when necessary for nausea Patient was given prescription for Vicodin 5/325 one every 6 hours as needed for pain #40 and no refills, and also refilled Ambien 10 mg by mouth daily at bedtime when necessary for sleep #30 no refills. Further plans pending results of labs and MRI   Amy S Esterwood PA-C 03/06/2016  I have taken an interval history, reviewed the chart, and examined the patient. I agree with the Advanced Practitioner's note and impression.

## 2016-03-10 ENCOUNTER — Other Ambulatory Visit: Payer: Self-pay

## 2016-03-10 DIAGNOSIS — K5090 Crohn's disease, unspecified, without complications: Secondary | ICD-10-CM

## 2016-03-14 ENCOUNTER — Ambulatory Visit (HOSPITAL_COMMUNITY): Payer: BLUE CROSS/BLUE SHIELD

## 2016-03-14 ENCOUNTER — Ambulatory Visit (HOSPITAL_COMMUNITY)
Admission: RE | Admit: 2016-03-14 | Discharge: 2016-03-14 | Disposition: A | Discharge status: Home / Self Care | Payer: BLUE CROSS/BLUE SHIELD | Source: Ambulatory Visit | Attending: Physician Assistant | Admitting: Physician Assistant

## 2016-03-14 DIAGNOSIS — D3501 Benign neoplasm of right adrenal gland: Secondary | ICD-10-CM | POA: Diagnosis not present

## 2016-03-14 DIAGNOSIS — Z9049 Acquired absence of other specified parts of digestive tract: Secondary | ICD-10-CM

## 2016-03-14 DIAGNOSIS — Z9889 Other specified postprocedural states: Secondary | ICD-10-CM | POA: Insufficient documentation

## 2016-03-14 DIAGNOSIS — R1031 Right lower quadrant pain: Secondary | ICD-10-CM

## 2016-03-14 DIAGNOSIS — K50019 Crohn's disease of small intestine with unspecified complications: Secondary | ICD-10-CM | POA: Diagnosis not present

## 2016-03-14 MED ORDER — GADOBENATE DIMEGLUMINE 529 MG/ML IV SOLN
13.0000 mL | Freq: Once | INTRAVENOUS | Status: AC | PRN
Start: 2016-03-14 — End: 2016-03-14
  Administered 2016-03-14: 13 mL via INTRAVENOUS

## 2016-03-15 ENCOUNTER — Encounter (HOSPITAL_COMMUNITY): Payer: Self-pay

## 2016-03-15 ENCOUNTER — Other Ambulatory Visit: Payer: Self-pay

## 2016-03-15 ENCOUNTER — Encounter (HOSPITAL_COMMUNITY)
Admission: RE | Admit: 2016-03-15 | Discharge: 2016-03-15 | Disposition: A | Discharge status: Home / Self Care | Payer: BLUE CROSS/BLUE SHIELD | Source: Ambulatory Visit | Attending: Internal Medicine | Admitting: Internal Medicine

## 2016-03-15 VITALS — BP 123/89 | HR 83 | Temp 98.0°F | Resp 16 | Wt 155.8 lb

## 2016-03-15 DIAGNOSIS — K5090 Crohn's disease, unspecified, without complications: Secondary | ICD-10-CM

## 2016-03-15 DIAGNOSIS — K50919 Crohn's disease, unspecified, with unspecified complications: Secondary | ICD-10-CM | POA: Diagnosis present

## 2016-03-15 MED ORDER — SODIUM CHLORIDE 0.9 % IV SOLN
INTRAVENOUS | Status: DC
  Administered 2016-03-15: 08:00:00 via INTRAVENOUS

## 2016-03-15 MED ORDER — VEDOLIZUMAB 300 MG IV SOLR
300.0000 mg | INTRAVENOUS | Status: DC
  Administered 2016-03-15: 300 mg via INTRAVENOUS
  Filled 2016-03-15: qty 5

## 2016-03-15 MED ORDER — BUDESONIDE 3 MG PO CPEP: ORAL_CAPSULE | ORAL | Status: DC

## 2016-03-15 NOTE — Discharge Instructions (Signed)
Vedolizumab injection solution °What is this medicine? °VEDOLIZUMAB (Ve doe LIZ you mab) is used to treat ulcerative colitis and Crohn's disease in adult patients. °This medicine may be used for other purposes; ask your health care provider or pharmacist if you have questions. °What should I tell my health care provider before I take this medicine? °They need to know if you have any of these conditions: °-diabetes °-hepatitis B or history of hepatitis B infection °-HIV or AIDS °-immune system problems °-infection or history of infections °-liver disease °-recently received or scheduled to receive a vaccine °-scheduled to have surgery °-tuberculosis, a positive skin test for tuberculosis or have recently been in close contact with someone who has tuberculosis °- °an unusual or allergic reaction to vedolizumab, other medicines, foods, dyes, or preservatives °-pregnant or trying to get pregnant °-breast-feeding °How should I use this medicine? °This medicine is for infusion into a vein. It is given by a health care professional in a hospital or clinic setting. °A special MedGuide will be given to you by the pharmacist with each prescription and refill. Be sure to read this information carefully each time. °Talk to your pediatrician regarding the use of this medicine in children. This medicine is not approved for use in children. °Overdosage: If you think you have taken too much of this medicine contact a poison control center or emergency room at once. °NOTE: This medicine is only for you. Do not share this medicine with others. °What if I miss a dose? °It is important not to miss your dose. Call your doctor or health care professional if you are unable to keep an appointment. °What may interact with this medicine? °-steroid medicines like prednisone or cortisone °-TNF-alpha inhibitors like natalizumab, adalimumab, and infliximab °-vaccines °This list may not describe all possible interactions. Give your health care  provider a list of all the medicines, herbs, non-prescription drugs, or dietary supplements you use. Also tell them if you smoke, drink alcohol, or use illegal drugs. Some items may interact with your medicine. °What should I watch for while using this medicine? °Your condition will be monitored carefully while you are receiving this medicine. Visit your doctor for regular check ups. Tell your doctor or healthcare professional if your symptoms do not start to get better or if they get worse. °Stay away from people who are sick. Call your doctor or health care professional for advice if you get a fever, chills or sore throat, or other symptoms of a cold or flu. Do not treat yourself. °In some patients, this medicine may cause a serious brain infection that may cause death. If you have any problems seeing, thinking, speaking, walking, or standing, tell your doctor right away. If you cannot reach your doctor, get urgent medical care. °What side effects may I notice from receiving this medicine? °Side effects that you should report to your doctor or health care professional as soon as possible: °-allergic reactions like skin rash, itching or hives, swelling of the face, lips, or tongue °-breathing problems °-changes in vision °-chest pain °-dark urine °-depression, feelings of sadness °-dizziness °-general ill feeling or flu-like symptoms °-irregular, missed, or painful menstrual periods °-light-colored stools °-loss of appetite, nausea °-muscle weakness °-problems with balance, talking, or walking °-right upper belly pain °-unusually weak or tired °-yellowing of the eyes or skin °Side effects that usually do not require medical attention (Report these to your doctor or health care professional if they continue or are bothersome.): °-aches, pains °-headache °-stomach upset °-tiredness °  This list may not describe all possible side effects. Call your doctor for medical advice about side effects. You may report side  effects to FDA at 1-800-FDA-1088. °Where should I keep my medicine? °This drug is given in a hospital or clinic and will not be stored at home. °NOTE: This sheet is a summary. It may not cover all possible information. If you have questions about this medicine, talk to your doctor, pharmacist, or health care provider. °  °© 2016, [employer]vier/[employer]. (2013-05-08 12:32:57) ° °

## 2016-03-17 NOTE — Telephone Encounter (Signed)
See Previous Encounter

## 2016-03-22 ENCOUNTER — Telehealth: Payer: Self-pay | Admitting: Internal Medicine

## 2016-03-22 MED ORDER — OXYCODONE-ACETAMINOPHEN 5-325 MG PO TABS: ORAL_TABLET | ORAL | Status: DC

## 2016-03-22 MED ORDER — PREDNISONE 10 MG PO TABS: ORAL_TABLET | ORAL | Status: DC

## 2016-03-22 MED ORDER — OXYCODONE HCL 5 MG PO TABA: ORAL_TABLET | ORAL | Status: DC

## 2016-03-22 NOTE — Telephone Encounter (Signed)
Patient reports continued pain and not feeling any better on budesonide.  Hydrocodone is not helping with the pain at all. She is currently taking 1.5 BID with no relief from pain Discussed with .Pyrtle 1.  D/C budesonide 2  Start Prednisone 40 mg for 7 days, she is to decrease by 5 mg every 7 days until off 3. Keep follow up for 05/19/16.  She is asked to call for worsening or not improving symptoms.  She is advised may have to change Biologics, but he would like to give Entyvio a little longer.  4. Oxycodone 5 mg 1-2 po 2-3 times a day as needed #60.  She is advised to use sparingly and only as really needed.  She understands to come pick up rx  She will call if her symptoms fail to improve or worsen

## 2016-03-22 NOTE — Addendum Note (Signed)
Addended by: Marlon Pel on: 03/22/2016 03:37 PM   Modules accepted: Orders, Medications

## 2016-04-25 ENCOUNTER — Telehealth: Payer: Self-pay | Admitting: Internal Medicine

## 2016-04-25 MED ORDER — ZOLPIDEM TARTRATE 10 MG PO TABS: 10.0000 mg | ORAL_TABLET | Freq: Every day | ORAL | Status: DC

## 2016-04-25 NOTE — Telephone Encounter (Signed)
yes

## 2016-04-25 NOTE — Telephone Encounter (Signed)
[name], PA refilled medication at last office visit. Can I refill Ambien?

## 2016-04-25 NOTE — Telephone Encounter (Signed)
Left a message for patient to return my call. 

## 2016-04-25 NOTE — Telephone Encounter (Signed)
Informed patient that we faxed her Ambien to the pharmacy. Patient verbalized understanding.

## 2016-05-01 ENCOUNTER — Telehealth: Payer: Self-pay | Admitting: Internal Medicine

## 2016-05-01 NOTE — Telephone Encounter (Signed)
I am unaware that Entyvio offers home infusions.  Could you check on this please.  Maybe the drug rep would know?

## 2016-05-02 NOTE — Telephone Encounter (Signed)
Spoke with Opal Sidles and let her know Meghan Charles is not a candidate for Home infusion of entyvio.

## 2016-05-02 NOTE — Telephone Encounter (Signed)
Spoke with [employer] regarding home infusions. They stated that Weyman Rodney is not FDA approved to be given at home but pts do receive it at home. The individual infusing the Entyvio would have to be trained and be able to handle possible hypersensitivity reactions including an anaphylactic reaction. [employer]/textile technologist letter regarding home infusion of drug.

## 2016-05-03 ENCOUNTER — Encounter: Payer: Self-pay | Admitting: *Deleted

## 2016-05-09 ENCOUNTER — Encounter (HOSPITAL_COMMUNITY): Payer: Self-pay

## 2016-05-09 ENCOUNTER — Encounter (HOSPITAL_COMMUNITY)
Admission: RE | Admit: 2016-05-09 | Discharge: 2016-05-09 | Disposition: A | Discharge status: Home / Self Care | Payer: BLUE CROSS/BLUE SHIELD | Source: Ambulatory Visit | Attending: Internal Medicine | Admitting: Internal Medicine

## 2016-05-09 VITALS — BP 123/94 | HR 100 | Temp 97.9°F | Resp 16 | Ht 66.0 in | Wt 156.6 lb

## 2016-05-09 DIAGNOSIS — K50919 Crohn's disease, unspecified, with unspecified complications: Secondary | ICD-10-CM | POA: Diagnosis not present

## 2016-05-09 DIAGNOSIS — K5090 Crohn's disease, unspecified, without complications: Secondary | ICD-10-CM

## 2016-05-09 MED ORDER — SODIUM CHLORIDE 0.9 % IV SOLN
INTRAVENOUS | Status: AC
  Administered 2016-05-09: 08:00:00 via INTRAVENOUS

## 2016-05-09 MED ORDER — SODIUM CHLORIDE 0.9 % IV SOLN
300.0000 mg | INTRAVENOUS | Status: DC
  Administered 2016-05-09: 300 mg via INTRAVENOUS
  Filled 2016-05-09: qty 5

## 2016-05-10 DIAGNOSIS — K50919 Crohn's disease, unspecified, with unspecified complications: Secondary | ICD-10-CM | POA: Diagnosis not present

## 2016-05-18 ENCOUNTER — Telehealth: Payer: Self-pay | Admitting: Internal Medicine

## 2016-05-18 MED ORDER — PROMETHAZINE HCL 12.5 MG PO TABS: ORAL_TABLET | ORAL | Status: DC

## 2016-05-18 NOTE — Telephone Encounter (Signed)
Pt aware and script sent to pharmacy.

## 2016-05-18 NOTE — Telephone Encounter (Signed)
Pt states she had to cancel her OV appt tomorrow with Dr. Hilarie Fredrickson because she does not have the 70$ copay this week. Pt states she has been vomiting for the past 2 days and that her zofran is not working, she has not been able to keep it down. Pt is requesting something else for nausea. Please advise.

## 2016-05-18 NOTE — Telephone Encounter (Signed)
Please ask patient to keep her appointment. He will have the front desk waive the co-pay tomorrow Phenergan 12.5 mg 1-2 tabs every 6-8 hrs PRN nausea not better with zofran Phenergan can be sedating and she need not drive until she is sure how she responds to this med

## 2016-05-19 ENCOUNTER — Encounter: Payer: Self-pay | Admitting: Internal Medicine

## 2016-05-19 ENCOUNTER — Ambulatory Visit: Payer: BLUE CROSS/BLUE SHIELD | Admitting: Internal Medicine

## 2016-05-19 ENCOUNTER — Ambulatory Visit (INDEPENDENT_AMBULATORY_CARE_PROVIDER_SITE_OTHER): Payer: BLUE CROSS/BLUE SHIELD | Admitting: Internal Medicine

## 2016-05-19 VITALS — BP 120/90 | HR 76 | Ht 66.0 in | Wt 157.0 lb

## 2016-05-19 DIAGNOSIS — Z79899 Other long term (current) drug therapy: Secondary | ICD-10-CM

## 2016-05-19 DIAGNOSIS — K50019 Crohn's disease of small intestine with unspecified complications: Secondary | ICD-10-CM | POA: Diagnosis not present

## 2016-05-19 DIAGNOSIS — R112 Nausea with vomiting, unspecified: Secondary | ICD-10-CM

## 2016-05-19 MED ORDER — TEMAZEPAM 30 MG PO CAPS: 30.0000 mg | ORAL_CAPSULE | Freq: Every evening | ORAL | Status: DC | PRN

## 2016-05-19 NOTE — Patient Instructions (Addendum)
Please restart Budesonide 9 mg daily.  Finish your prednisone taper.  Follow a low residue diet.  Continue Promethazine for nausea.  Please discontinue Ambien and take temazepam at night as needed. Rx has been sent to the pharmacy.  You have been scheduled for a follow up with Dr Hilarie Fredrickson on 07/17/16 @ 9:45 am.   Please have your [facility] test completed within the next week at [facility] at [address].  If you are age 45 or older, your body mass index should be between 23-30. Your Body mass index is 25.35 kg/(m^2). If this is out of the aforementioned range listed, please consider follow up with your Primary Care Provider.  If you are age 36 or younger, your body mass index should be between 19-25. Your Body mass index is 25.35 kg/(m^2). If this is out of the aformentioned range listed, please consider follow up with your Primary Care Provider.

## 2016-05-19 NOTE — Progress Notes (Signed)
Subjective:    Patient ID: Meghan Charles, female    DOB: 05-Oct-1971, 45 y.o.   MRN: [id number]  HPI Meghan Charles is a 45 year old female with history of Crohn's ileitis who is seen in follow-up. She was last seen on 03/06/2016 by [name], PA-C. She has been maintained on Entyvio therapy since December 2016 for her history of stricturing Crohn's disease. She underwent segmental ileal resection by Dr. Dalbert Batman in May 2016. On fortunately she was diagnosed with latent TB and completed therapy with isoniazid. She's been tolerating Entyvio infusions without difficulty during the infusion and her last infusion was 05/09/2016. Fortunately she has developed recurrent right mid abdominal pain associated with nausea and vomiting. MR enterography was ordered and performed on 03/14/2016. This showed mildly dilated small bowel loop in the midabdomen with mild wall thickening and hyperenhancement consistent with active Crohn's disease. There was no evidence of complication. Initially she was started on budesonide therapy at 9 mg daily but symptoms progressed and she was given a prednisone taper. She took 40 mg 1 week and tapered down by 5 mg every 7 days. She is currently on 5 mg daily and has 3 days left.  Prednisone at 40 mg daily help dramatically but as she neared the lower doses, approximately 20 mg daily, she developed recurrent right sided abdominal pain. Pain is still not as bad as it was prior to steroid treatment. She still having intermittent issues with nausea and vomiting. Appetite is been somewhat decreased. Zofran was not helping her nausea at all. She denies fever or chills. She has not missed work. She is having loose stools 1-3 times per day. She reports stools as nonbloody and non-melenic. She is frustrated and fears needing more surgery.   Review of Systems as per history of present illness, otherwise negative  Current Medications, Allergies, Past Medical History, Past Surgical History,  Family History and Social History were reviewed in [employer] record.      Objective:   Physical Exam BP 120/90 mmHg  Pulse 76  Ht 5\' 6"  (1.676 m)  Wt 157 lb (71.215 kg)  BMI 25.35 kg/m2 Constitutional: Well-developed and well-nourished. No distress. HEENT: Normocephalic and atraumatic. Oropharynx is clear and moist. No oropharyngeal exudate. Conjunctivae are normal.  No scleral icterus. Neck: Neck supple. Trachea midline. Cardiovascular: Normal rate, regular rhythm and intact distal pulses. No M/R/G Pulmonary/chest: Effort normal and breath sounds normal. No wheezing, rales or rhonchi. Abdominal: Soft, Right mid and lower abdominal tenderness which is moderate without rebound or guarding, nondistended. Bowel sounds active throughout. There are no masses palpable. No hepatosplenomegaly. Extremities: no clubbing, cyanosis, or edema Lymphadenopathy: No cervical adenopathy noted. Neurological: Alert and oriented to person place and time. Skin: Skin is warm and dry. No rashes noted. Psychiatric: Somewhat depressed mood and affect. Behavior is normal.  CBC    Component Value Date/Time   WBC 7.7 03/06/2016 1503   RBC 4.40 03/06/2016 1503   RBC 3.91 11/05/2012 0837   HGB 13.3 03/06/2016 1503   HCT 39.1 03/06/2016 1503   PLT 332.0 03/06/2016 1503   MCV 89.0 03/06/2016 1503   MCH 29.8 01/14/2016 0433   MCHC 33.9 03/06/2016 1503   RDW 13.0 03/06/2016 1503   LYMPHSABS 2.1 03/06/2016 1503   MONOABS 0.6 03/06/2016 1503   EOSABS 0.1 03/06/2016 1503   BASOSABS 0.1 03/06/2016 1503    CMP     Component Value Date/Time   NA 137 03/06/2016 1503   K 5.6* 03/06/2016 1503  CL 103 03/06/2016 1503   CO2 29 03/06/2016 1503   GLUCOSE 98 03/06/2016 1503   BUN 14 03/06/2016 1503   CREATININE 0.74 03/06/2016 1503   CALCIUM 9.9 03/06/2016 1503   PROT 9.3* 01/14/2016 0433   ALBUMIN 5.4* 01/14/2016 0433   AST 28 01/14/2016 0433   ALT 19 01/14/2016 0433   ALKPHOS 64  01/14/2016 0433   BILITOT 0.9 01/14/2016 0433   GFRNONAA >60 01/14/2016 0433   GFRAA >60 01/14/2016 0433       Assessment & Plan:  45 year old female with history of Crohn's ileitis who is seen in follow-up.   1. Recurrent small bowel Crohn's disease associated with nausea and vomiting  -- unfortunately and despite Entyvio therapy her small bowel Crohn's disease has returned in fairly short order after surgery last year. Symptoms persist even despite prednisone taper. She recently received Entyvio therapy. I recommended checking Anser VDZ to evaluate for Entyvio antibodies and also check a drug level. Complete prednisone taper in the next 2 days and begin budesonide 9 mg daily. Hopefully now that she is somewhat better after the prolonged steroid taper with prednisone she will respond more favorably to budesonide. I've also recommended that we strongly consider adding azathioprine as, patient therapy given her recurrent disease. We discussed the risks, benefits and alternatives including the risk of worsening nausea, leukopenia, hepatitis, and pancreatitis. Combo therapy also slightly further raises the rare risk of T-cell lymphoma. TPMT was previously normal.  I recommended low residue diet. Phenergan seems to be working better for nausea. She can use Phenergan 12.5-25 mg every 6-8 hours as needed. She is aware of the possible sedating side effects. I will delay the decision regarding azathioprine until I have the Entyvio level and rule out antibody. If antibody is present, she will be switched to Stelara. 2 units of been avoided since she developed necrotizing pneumonitis shortly after starting Remicade.  Follow with me in August, sooner if necessary  25 minutes spent with the patient today. Greater than 50% was spent in counseling and coordination of care with the patient

## 2016-05-23 ENCOUNTER — Telehealth: Payer: Self-pay | Admitting: Internal Medicine

## 2016-05-23 MED ORDER — SERTRALINE HCL 50 MG PO TABS: ORAL_TABLET | ORAL | Status: DC

## 2016-05-23 NOTE — Telephone Encounter (Signed)
patient states that she is having depression because of all that she has been going through with Crohns. She is wanting to know if Dr. Hilarie Fredrickson would prescribe her something or recommend something that she can take. Please advise.

## 2016-05-23 NOTE — Telephone Encounter (Signed)
Previously she was given zoloft for similar reason If she tolerated this med, I recommend zoloft 50 mg daily.  If tolerated and felt needed this can be increased to 100 mg per day after 7 days She should call or seek care immediately in the ED for any worsening anxiety, depression, suicidal thoughts or thoughts of hurting herself or others

## 2016-05-23 NOTE — Telephone Encounter (Signed)
Spoke with pt and she states she did tolerate the zoloft ok and would like to try this again. Script sent to the pharmacy.

## 2016-05-27 IMAGING — CT CT CHEST W/O CM
2 of 4 series · 15 of 36 positions shown, 18 images · IV contrast (Omnipaque 300)
Comparison: Chest x-ray 11/03/2004.  Chest CT 09/24/2014.

CLINICAL DATA: Follow-up. Recent hospitalization in [REDACTED] for
bilateral community acquired pneumonia. Continued intermittent
shortness of breath with exertion. Left chest pressure. Slight
cough.

EXAM:
CT CHEST WITHOUT CONTRAST
TECHNIQUE: Multidetector CT imaging of the chest was performed following the
standard protocol without IV contrast..

[Series 2: chest routine with · axial · 0.64mm/px · z∈[-269,-29]mm · 12 of 58 slices shown, 15 images]
[im 5/58  mediastinal]
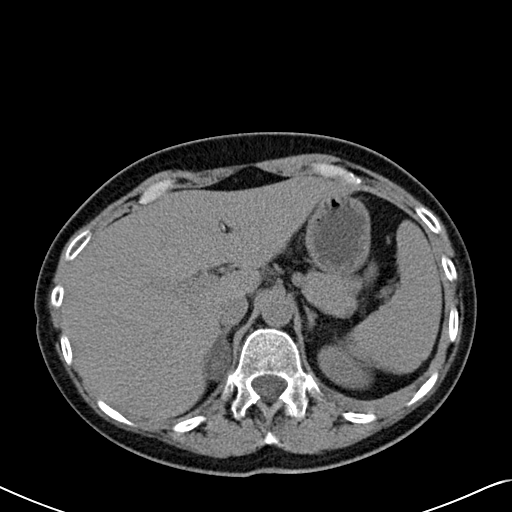
[im 5/58  lung]
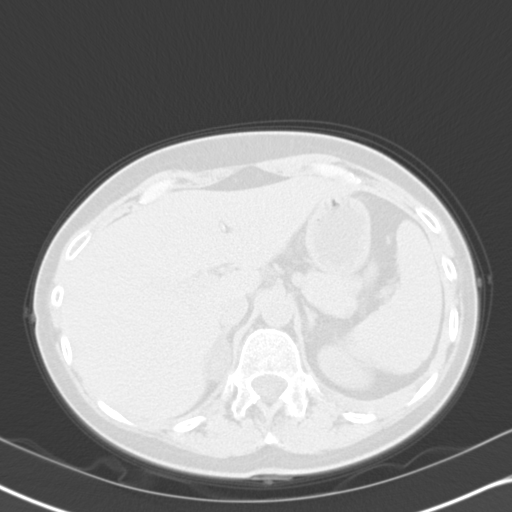
[im 9/58  lung]
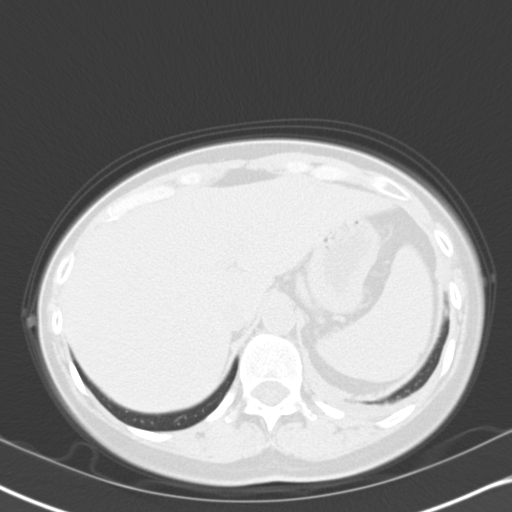
[im 14/58  lung]
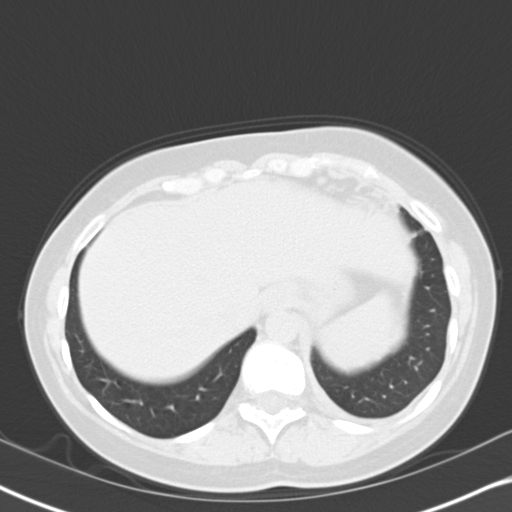
[im 18/58  lung]
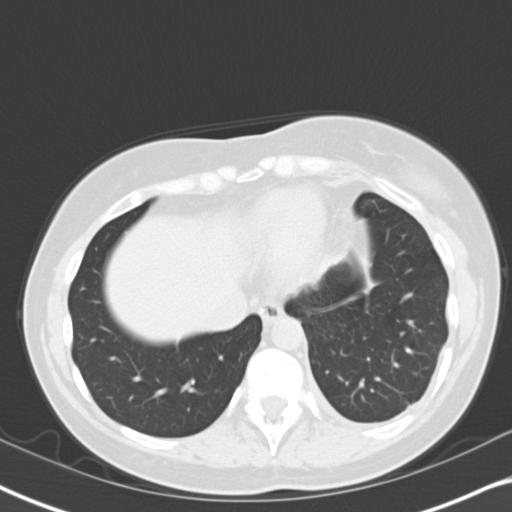
[im 22/58  mediastinal]
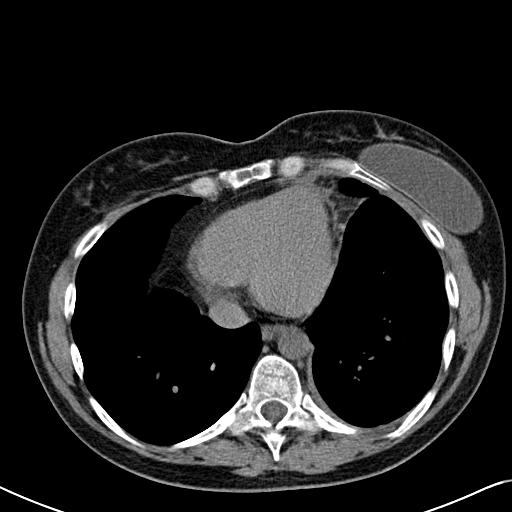
[im 22/58  lung]
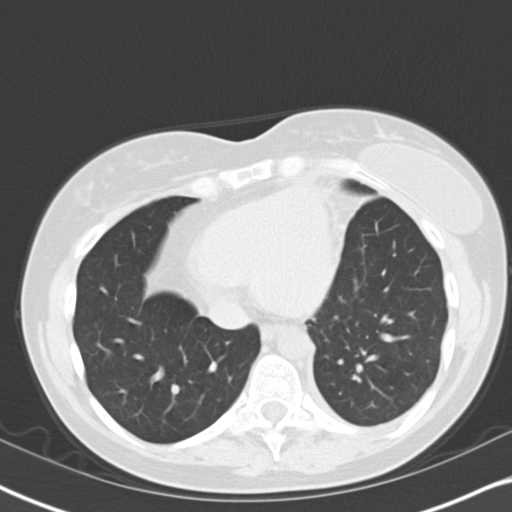
[im 27/58  lung]
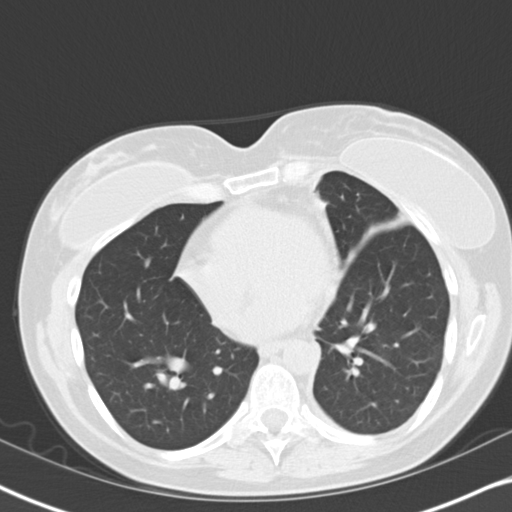
[im 31/58  lung]
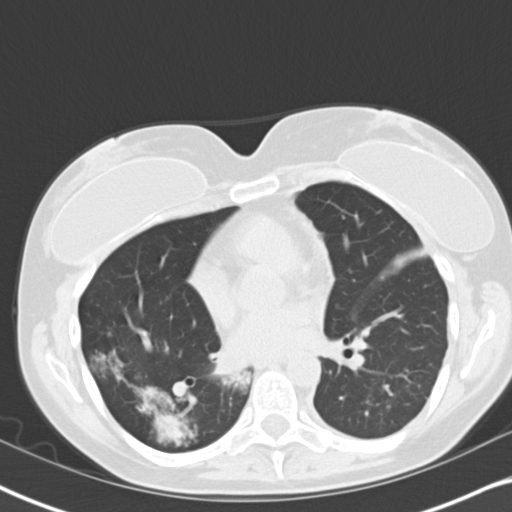
[im 36/58  lung]
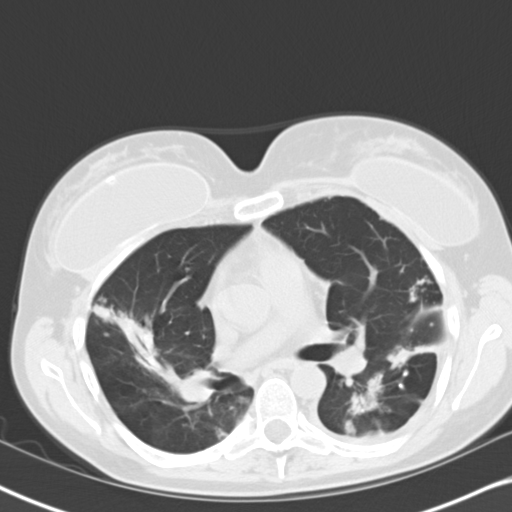
[im 40/58  mediastinal]
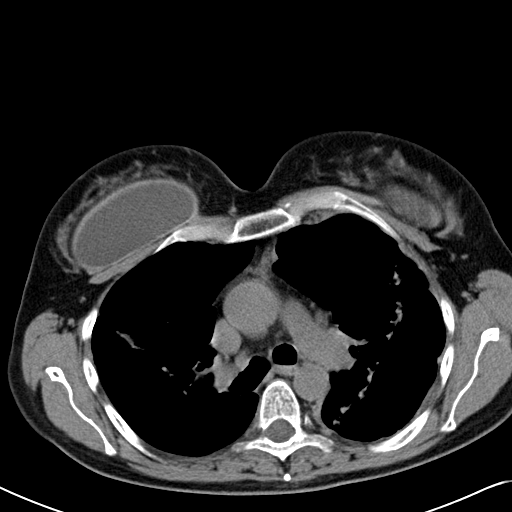
[im 40/58  lung]
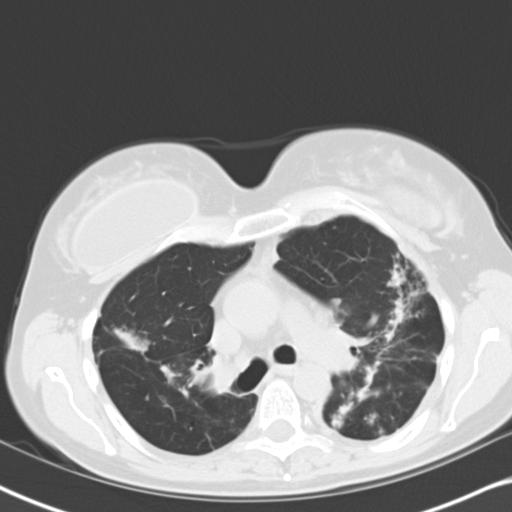
[im 44/58  lung]
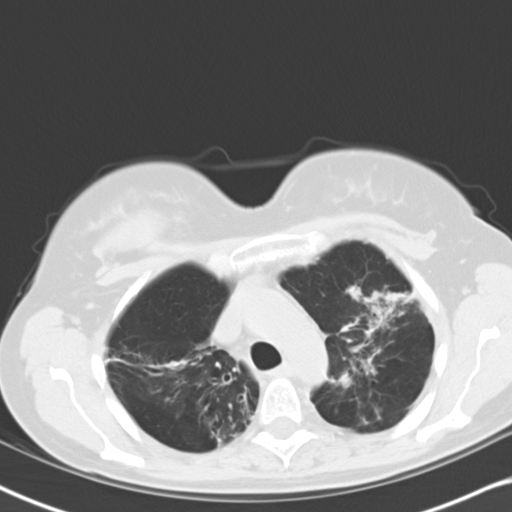
[im 49/58  lung]
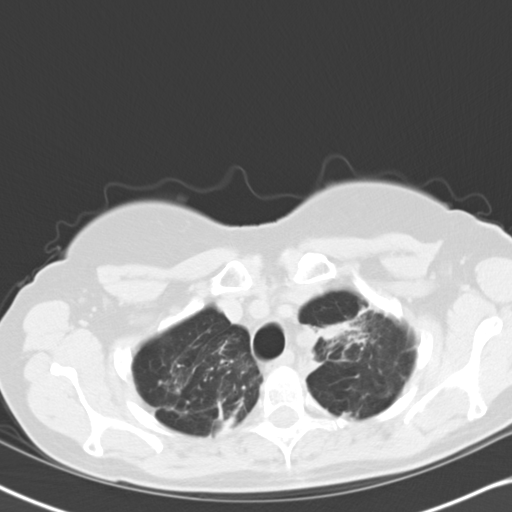
[im 53/58  lung]
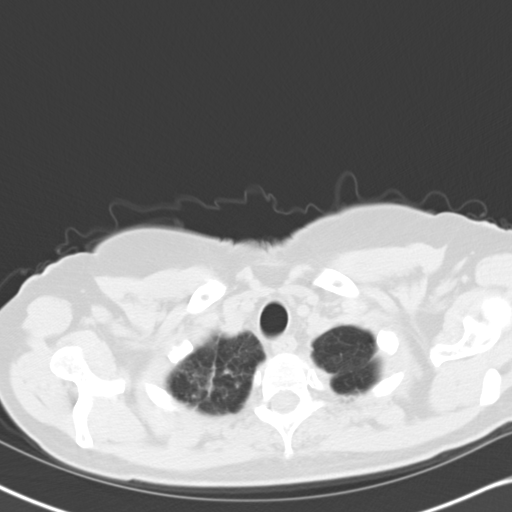

[Series 602: cor · coronal · 0.64mm/px · 3 of 99 slices shown]
[im 20/99  lung]
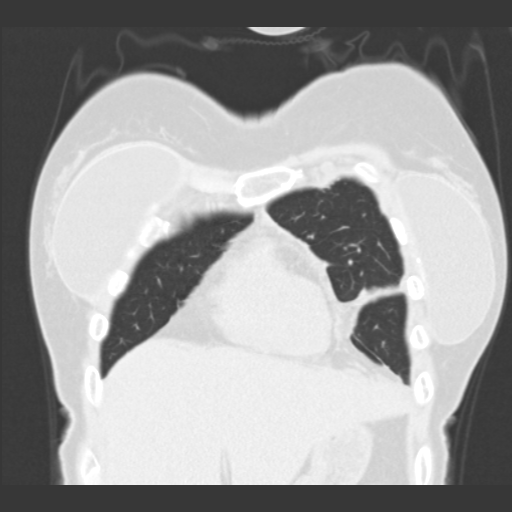
[im 40/99  lung]
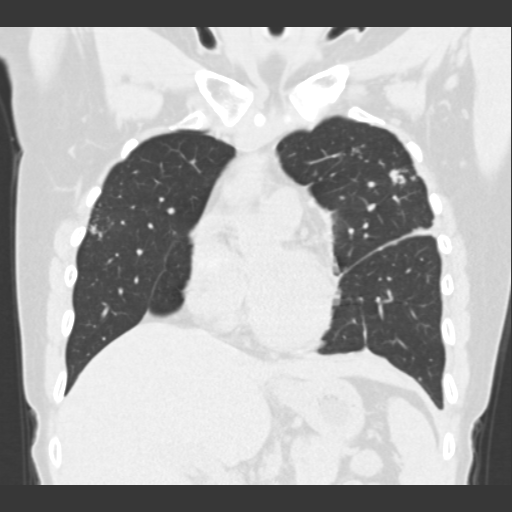
[im 59/99  lung]
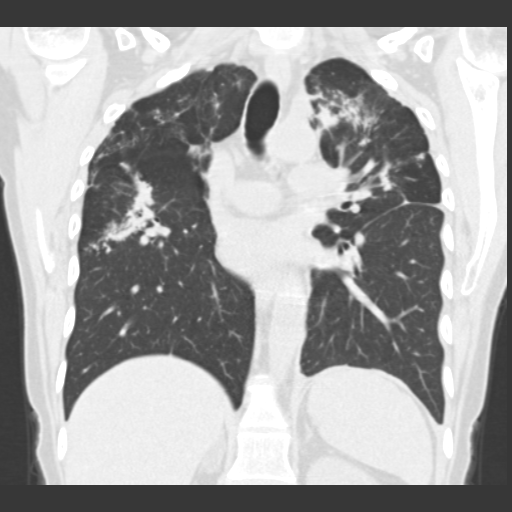

[15 of 36 positions shown; findings below may reference images not displayed]

FINDINGS: The previously seen large left pleural effusion has nearly
completely resolved. Trace left pleural effusion persists. The
previously seen bilateral multi focal multi lobar airspace opacities
have not significantly changed. Airspace opacities again noted in
both upper lobes and superior segments of both lower lobes. The fact
that this is unchanged raises the possibility of a noninfectious
inflammatory process. No right pleural effusion.

Scarring again noted in the upper lobes, stable.

Bilateral breast implants noted. Chest wall soft tissues otherwise
unremarkable. Heart is normal size. Aorta is normal caliber. No
mediastinal, hilar, or axillary adenopathy.

2.4 cm low-density lesion within the right adrenal gland. This is
compatible with adenoma. No acute findings in the upper abdomen.
IMPRESSION: Stable airspace opacities within both upper and lower lobes. The
stability suggests a noninfectious inflammatory process or
postinflammatory scarring.

Near complete resolution of left pleural effusion. Trace left
effusion persists.

## 2016-06-02 ENCOUNTER — Telehealth: Payer: Self-pay | Admitting: *Deleted

## 2016-06-02 DIAGNOSIS — K50019 Crohn's disease of small intestine with unspecified complications: Secondary | ICD-10-CM

## 2016-06-02 MED ORDER — AZATHIOPRINE 50 MG PO TABS: ORAL_TABLET | ORAL | Status: DC

## 2016-06-02 NOTE — Telephone Encounter (Signed)
Dr Hilarie Fredrickson has received patient's AnserVDZ from [facility]. Serum Concentration for Entyvio is >40 (range 0-40). No antibodies (range 0-75). Per Dr Hilarie Fredrickson, Weyman Rodney level very adequate. No antibodies. Begin azathioprine 50 mg daily. If tolerated ater 2 weeks, increase dose to 150 mg daily. Check CMP, CBC in 3 weeks. Continue Entyvio." I have left a voicemail for patient to call back.

## 2016-06-02 NOTE — Telephone Encounter (Signed)
I have spoken to patient to advise of Dr Vena Rua recommendations/interpretations. She verbalizes understanding. I have sent azathioprine to pharmacy. I will also remind her to come back for labs in 3 weeks.

## 2016-06-06 ENCOUNTER — Encounter: Payer: Self-pay | Admitting: Internal Medicine

## 2016-06-06 ENCOUNTER — Emergency Department (HOSPITAL_COMMUNITY)
Admission: EM | Admit: 2016-06-06 | Discharge: 2016-06-06 | Disposition: A | Discharge status: Home / Self Care | Payer: BLUE CROSS/BLUE SHIELD | Attending: Emergency Medicine | Admitting: Emergency Medicine

## 2016-06-06 ENCOUNTER — Encounter (HOSPITAL_COMMUNITY): Payer: Self-pay

## 2016-06-06 DIAGNOSIS — Z79899 Other long term (current) drug therapy: Secondary | ICD-10-CM | POA: Insufficient documentation

## 2016-06-06 DIAGNOSIS — R109 Unspecified abdominal pain: Secondary | ICD-10-CM

## 2016-06-06 DIAGNOSIS — R1031 Right lower quadrant pain: Secondary | ICD-10-CM | POA: Diagnosis not present

## 2016-06-06 DIAGNOSIS — R197 Diarrhea, unspecified: Secondary | ICD-10-CM | POA: Diagnosis not present

## 2016-06-06 DIAGNOSIS — R112 Nausea with vomiting, unspecified: Secondary | ICD-10-CM | POA: Diagnosis not present

## 2016-06-06 LAB — LIPASE, BLOOD: LIPASE: 32 U/L (ref 11–51)

## 2016-06-06 LAB — URINALYSIS, ROUTINE W REFLEX MICROSCOPIC
GLUCOSE, UA: NEGATIVE mg/dL
KETONES UR: NEGATIVE mg/dL
Leukocytes, UA: NEGATIVE
Nitrite: NEGATIVE
PH: 6 (ref 5.0–8.0)
PROTEIN: 30 mg/dL — AB
Specific Gravity, Urine: 1.044 — ABNORMAL HIGH (ref 1.005–1.030)

## 2016-06-06 LAB — URINE MICROSCOPIC-ADD ON

## 2016-06-06 LAB — COMPREHENSIVE METABOLIC PANEL
ALBUMIN: 5 g/dL (ref 3.5–5.0)
ALT: 19 U/L (ref 14–54)
AST: 20 U/L (ref 15–41)
Alkaline Phosphatase: 78 U/L (ref 38–126)
Anion gap: 12 (ref 5–15)
BUN: 22 mg/dL — AB (ref 6–20)
CHLORIDE: 103 mmol/L (ref 101–111)
CO2: 24 mmol/L (ref 22–32)
CREATININE: 0.88 mg/dL (ref 0.44–1.00)
Calcium: 9.6 mg/dL (ref 8.9–10.3)
GFR calc Af Amer: >60 mL/min (ref >60)
GLUCOSE: 128 mg/dL — AB (ref 65–99)
POTASSIUM: 3.9 mmol/L (ref 3.5–5.1)
Sodium: 139 mmol/L (ref 135–145)
Total Bilirubin: 0.7 mg/dL (ref 0.3–1.2)
Total Protein: 8.7 g/dL — ABNORMAL HIGH (ref 6.5–8.1)

## 2016-06-06 LAB — CBC
HEMATOCRIT: 44 % (ref 36.0–46.0)
Hemoglobin: 14.9 g/dL (ref 12.0–15.0)
MCH: 30.7 pg (ref 26.0–34.0)
MCHC: 33.9 g/dL (ref 30.0–36.0)
MCV: 90.5 fL (ref 78.0–100.0)
PLATELETS: 358 10*3/uL (ref 150–400)
RBC: 4.86 MIL/uL (ref 3.87–5.11)
RDW: 13.2 % (ref 11.5–15.5)
WBC: 15.9 10*3/uL — AB (ref 4.0–10.5)

## 2016-06-06 LAB — I-STAT BETA HCG BLOOD, ED (MC, WL, AP ONLY): I-stat hCG, quantitative: <5 m[IU]/mL (ref <5)

## 2016-06-06 MED ORDER — HYDROCODONE-ACETAMINOPHEN 5-325 MG PO TABS: 1.0000 | ORAL_TABLET | Freq: Four times a day (QID) | ORAL | Status: DC | PRN

## 2016-06-06 MED ORDER — ONDANSETRON HCL 4 MG/2ML IJ SOLN
4.0000 mg | Freq: Once | INTRAMUSCULAR | Status: AC
  Administered 2016-06-06: 4 mg via INTRAVENOUS
  Filled 2016-06-06: qty 2

## 2016-06-06 MED ORDER — PROMETHAZINE HCL 25 MG PO TABS: 25.0000 mg | ORAL_TABLET | Freq: Three times a day (TID) | ORAL | Status: DC | PRN

## 2016-06-06 MED ORDER — SODIUM CHLORIDE 0.9 % IV BOLUS (SEPSIS)
1000.0000 mL | Freq: Once | INTRAVENOUS | Status: AC
  Administered 2016-06-06: 1000 mL via INTRAVENOUS

## 2016-06-06 MED ORDER — HYDROMORPHONE HCL 1 MG/ML IJ SOLN
1.0000 mg | Freq: Once | INTRAMUSCULAR | Status: AC
  Administered 2016-06-06: 1 mg via INTRAVENOUS
  Filled 2016-06-06: qty 1

## 2016-06-06 NOTE — ED Provider Notes (Signed)
CSN: [id number]     Arrival date & time 06/06/16  1059 History   First MD Initiated Contact with Patient 06/06/16 1206     Chief Complaint  Patient presents with  . Abdominal Pain  . Emesis    Patient is a 45 y.o. female presenting with abdominal pain and vomiting. The history is provided by the patient.  Abdominal Pain Pain location:  RLQ Pain quality: aching   Pain radiates to:  Does not radiate Pain severity:  Moderate Onset quality:  Gradual Duration:  4 days Timing:  Intermittent Progression:  Worsening Chronicity:  New Relieved by:  Nothing Worsened by:  Movement and palpation Associated symptoms: diarrhea, nausea and vomiting   Associated symptoms: no fever, no hematemesis and no hematochezia   Risk factors: multiple surgeries   Emesis Associated symptoms: abdominal pain and diarrhea   Patient with h/o crohn's disease presents with nausea/vomiting and abd pain for about 4 days It started soon after taking imuran for first time No fever No bloody diarrhea This is similar to prior exacerbations of crohn's but not as severe  Past Medical History  Diagnosis Date  . Gallbladder polyp   . Anxiety   . Colon polyps 05/2014    TUBULAR ADENOMA AND HYPERPLASTIC POLYP.  . Crohn's disease ([place])   . Tubular adenoma of colon   . Colon obstruction [facility])    Past Surgical History  Procedure Laterality Date  . Other surgical history      heel surgery  . Wisdom teeth extracted    . Right heel surgery      with plates and screws  . Colonoscopy w/ biopsies  2015  . Colon resection N/A 05/11/2015    Procedure: Laparotomy with resection of Chron's Disease small bowel resection biopsy mesenteric nodule;  Surgeon: Fanny Skates, MD;  Location: [facility];  Service: General;  Laterality: N/A;   Family History  Problem Relation Age of Onset  . Colon cancer Maternal Grandmother 92  . Esophageal cancer Neg Hx   . Stomach cancer Neg Hx   . Rectal cancer Neg Hx   . Pancreatic cancer Neg  Hx    Social History  Substance Use Topics  . Smoking status: Never Smoker   . Smokeless tobacco: Never Used  . Alcohol Use: 0.0 oz/week    0 Standard drinks or equivalent per week     Comment: 3 times per week   OB History    No data available     Review of Systems  Constitutional: Negative for fever.  Gastrointestinal: Positive for nausea, vomiting, abdominal pain and diarrhea. Negative for hematochezia and hematemesis.  All other systems reviewed and are negative.     Allergies  Morphine and related  Home Medications   Prior to Admission medications   Medication Sig Start Date End Date Taking? Authorizing Provider  azaTHIOprine (IMURAN) 50 MG tablet Take 1 tablet by mouth x 2 weeks, then increase to 3 tablets (150 mg) daily thereafter 06/02/16  Yes Jerene Bears, MD  budesonide (ENTOCORT EC) 3 MG 24 hr capsule Take 9 mg by mouth daily.   Yes Historical Provider, MD  naproxen sodium (ANAPROX) 220 MG tablet Take 880 mg by mouth daily as needed (pain).   Yes Historical Provider, MD  promethazine (PHENERGAN) 12.5 MG tablet Take 1-2 tabs by mouth every 6-8 hours as needed for nausea 05/18/16  Yes Jerene Bears, MD  sertraline (ZOLOFT) 50 MG tablet Take one tablet by mouth daily. Increase to  two tablets daily if needed after 7 days. Patient taking differently: Take 100 mg by mouth daily.  05/23/16  Yes Jerene Bears, MD  temazepam (RESTORIL) 30 MG capsule Take 1 capsule (30 mg total) by mouth at bedtime as needed for sleep. 05/19/16  Yes Jerene Bears, MD  predniSONE (DELTASONE) 10 MG tablet Take 40 mg by mouth for 7 days, 35 mg for 7 days, 30 mg for 7 days, 25 mg for 7 days, 20 mg for 7 days, 15 mg for 7 days, 10 mg for 7 days, 5 mg for 7 days then stop Patient not taking: Reported on 06/06/2016 03/22/16   Jerene Bears, MD   BP 140/101 mmHg  Pulse 100  Temp(Src) 98 F (36.7 C) (Oral)  Resp 18  SpO2 95% Physical Exam CONSTITUTIONAL: Well developed/well nourished HEAD:  Normocephalic/atraumatic EYES: EOMI/PERRL ENMT: Mucous membranes moist NECK: supple no meningeal signs SPINE/BACK:entire spine nontender CV: S1/S2 noted, no murmurs/rubs/gallops noted LUNGS: Lungs are clear to auscultation bilaterally, no apparent distress ABDOMEN: soft, moderate RLQ tenderness, no rebound or guarding, bowel sounds noted throughout abdomen, well healed abdominal scar noted GU:no cva tenderness NEURO: Pt is awake/alert/appropriate, moves all extremitiesx4.  No facial droop.   EXTREMITIES: pulses normal/equal, full ROM SKIN: warm, color normal PSYCH: no abnormalities of mood noted, alert and oriented to situation  ED Course  Procedures  Medications  HYDROmorphone (DILAUDID) injection 1 mg (1 mg Intravenous Given 06/06/16 1228)  ondansetron (ZOFRAN) injection 4 mg (4 mg Intravenous Given 06/06/16 1227)  sodium chloride 0.9 % bolus 1,000 mL (0 mLs Intravenous Stopped 06/06/16 1339)  HYDROmorphone (DILAUDID) injection 1 mg (1 mg Intravenous Given 06/06/16 1335)  ondansetron (ZOFRAN) injection 4 mg (4 mg Intravenous Given 06/06/16 1335)    Labs Review Labs Reviewed  COMPREHENSIVE METABOLIC PANEL - Abnormal; Notable for the following:    Glucose, Bld 128 (*)    BUN 22 (*)    Total Protein 8.7 (*)    All other components within normal limits  CBC - Abnormal; Notable for the following:    WBC 15.9 (*)    All other components within normal limits  URINALYSIS, ROUTINE W REFLEX MICROSCOPIC (NOT AT [facility]) - Abnormal; Notable for the following:    Color, Urine AMBER (*)    APPearance CLOUDY (*)    Specific Gravity, Urine 1.044 (*)    Hgb urine dipstick LARGE (*)    Bilirubin Urine SMALL (*)    Protein, ur 30 (*)    All other components within normal limits  URINE MICROSCOPIC-ADD ON - Abnormal; Notable for the following:    Squamous Epithelial / LPF 0-5 (*)    Bacteria, UA MANY (*)    All other components within normal limits  LIPASE, BLOOD  I-STAT BETA HCG BLOOD, ED ([facility],  [facility], [facility] ONLY)    3:58 PM Pt with h/o crohn's disease s/p colon resection She reports ABD pain/vomiting/diarrhea started after beginning imuran Her pain dramatically improved and 2nd dose of dilaudid Her nausea is gone I offered CT imaging, but she feels well enough for d/c home She feels this is likely crohn's flare She will f/u with GI this week She request pain meds at discharge Will monitor until 1730 so she can drive home  MDM   Final diagnoses:  Abdominal pain, unspecified abdominal location  Nausea vomiting and diarrhea    Nursing notes including past medical history and social history reviewed and considered in documentation Labs/vital reviewed myself and considered during  evaluation Previous records reviewed and considered     Ripley Fraise, MD 06/06/16 1600

## 2016-06-06 NOTE — Discharge Instructions (Signed)
°  SEEK IMMEDIATE MEDICAL ATTENTION IF: The pain does not go away or becomes severe, particularly over the next 8-12 hours.  A temperature above 100.74F develops.  Repeated vomiting occurs (multiple episodes).   In an adult, the left lower portion of the abdomen could be colitis or diverticulitis.  Blood is being passed in stools or vomit (bright red or black tarry stools).  Return also if you develop chest pain, difficulty breathing, dizziness or fainting, or become confused, poorly responsive, or inconsolable.

## 2016-06-06 NOTE — ED Notes (Signed)
Pt with hx of chron's disease.  Pt with n/v and abdominal pain since Saturday.  Has recently started new meds.  No bloody discharge.

## 2016-06-06 NOTE — Progress Notes (Signed)
[facility] ED CM noted pt with coverage but no pcp listed [facility] ED CM spoke with pt on how to obtain an in network pcp with insurance coverage via the customer service number or web site  Cm reviewed ED level of care for crisis/emergent services and community pcp level of care to manage continuous or chronic medical concerns.  The pt voiced understanding CM encouraged pt and discussed pt's responsibility to verify with pt's insurance carrier that any recommended medical provider offered by any emergency room or a hospital provider is within the carrier's network. The pt voiced understanding  Sees primary her GI dr

## 2016-06-06 NOTE — ED Notes (Signed)
SL removed from [facility]

## 2016-06-07 ENCOUNTER — Telehealth: Payer: Self-pay | Admitting: Internal Medicine

## 2016-06-07 NOTE — Telephone Encounter (Signed)
I would recommend that she attempt to continue AZA given her Crohn's disease activity despite Entyvio If she notices nausea and or vomiting worsening after taking this med, she needs to notify me immediately She will need serial CBC and liver enzymes testing on this med as previously recommended/stated

## 2016-06-07 NOTE — Telephone Encounter (Signed)
Patient advised of Dr Pyrtle's recommendations. She verbalizes understanding. 

## 2016-06-07 NOTE — Telephone Encounter (Signed)
Her last dose was yesterday (which she thinks she vomited up). She has not taken any today.

## 2016-06-07 NOTE — Telephone Encounter (Signed)
Is she continuing AZA currently?

## 2016-06-07 NOTE — Telephone Encounter (Signed)
===  View-only below this line===  ----- Message -----    From: Larina Bras, CMA    Sent: 06/07/2016   9:31 AM      To: Larina Bras, CMA  Left voicemail for patient to call back. ----- Message -----    From: Jerene Bears, MD    Sent: 06/06/2016   5:14 PM      To: Larina Bras, CMA  Keighley was back in the ED today N/V Please check on her. She recently started AZA. She may not be able to tolerate this.  Lipase was normal, so likely not pancreatitis If Crohn's flare then will likely need to switch off Entyvio due to lack of response JMP  ----- Message -----    From: SYSTEM    Sent: 06/06/2016   5:12 PM      To: Jerene Bears, MD

## 2016-06-07 NOTE — Telephone Encounter (Signed)
I have spoken to patient who states that she is doing much better than yesterday. States she began having vomiting Saturday as well as some right sided abdominal cramping and diarrhea. No blood in stool. She is currently taking zofran every 8 hours to help with nausea. Patient advised of Dr Vena Rua thoughts

## 2016-06-13 ENCOUNTER — Other Ambulatory Visit: Payer: Self-pay | Admitting: Internal Medicine

## 2016-06-29 ENCOUNTER — Other Ambulatory Visit: Payer: Self-pay

## 2016-06-29 DIAGNOSIS — K50119 Crohn's disease of large intestine with unspecified complications: Secondary | ICD-10-CM

## 2016-06-29 MED ORDER — VEDOLIZUMAB 300 MG IV SOLR: 300.0000 mg | Freq: Once | INTRAVENOUS | Status: DC

## 2016-06-29 MED ORDER — SODIUM CHLORIDE 0.9 % IV SOLN: INTRAVENOUS | Status: DC

## 2016-07-03 ENCOUNTER — Other Ambulatory Visit: Payer: Self-pay

## 2016-07-03 DIAGNOSIS — K50919 Crohn's disease, unspecified, with unspecified complications: Secondary | ICD-10-CM

## 2016-07-03 MED ORDER — VEDOLIZUMAB 300 MG IV SOLR: 300.0000 mg | Freq: Once | INTRAVENOUS | Status: DC

## 2016-07-03 MED ORDER — SODIUM CHLORIDE 0.9 % IV SOLN: 300.0000 mg | Freq: Once | INTRAVENOUS | Status: DC

## 2016-07-04 ENCOUNTER — Encounter (HOSPITAL_COMMUNITY)
Admission: RE | Admit: 2016-07-04 | Discharge: 2016-07-04 | Disposition: A | Discharge status: Home / Self Care | Payer: BLUE CROSS/BLUE SHIELD | Source: Ambulatory Visit | Attending: Internal Medicine | Admitting: Internal Medicine

## 2016-07-04 ENCOUNTER — Encounter (HOSPITAL_COMMUNITY): Payer: Self-pay

## 2016-07-04 DIAGNOSIS — K50919 Crohn's disease, unspecified, with unspecified complications: Secondary | ICD-10-CM | POA: Insufficient documentation

## 2016-07-04 MED ORDER — SODIUM CHLORIDE 0.9 % IV SOLN
300.0000 mg | Freq: Once | INTRAVENOUS | Status: AC
  Administered 2016-07-04: 300 mg via INTRAVENOUS
  Filled 2016-07-04: qty 5

## 2016-07-04 MED ORDER — SODIUM CHLORIDE 0.9 % IV SOLN
INTRAVENOUS | Status: DC
  Administered 2016-07-04: 09:00:00 via INTRAVENOUS

## 2016-07-04 NOTE — Discharge Instructions (Signed)
Vedolizumab injection solution °What is this medicine? °VEDOLIZUMAB (Ve doe LIZ you mab) is used to treat ulcerative colitis and Crohn's disease in adult patients. °This medicine may be used for other purposes; ask your health care provider or pharmacist if you have questions. °What should I tell my health care provider before I take this medicine? °They need to know if you have any of these conditions: °-diabetes °-hepatitis B or history of hepatitis B infection °-HIV or AIDS °-immune system problems °-infection or history of infections °-liver disease °-recently received or scheduled to receive a vaccine °-scheduled to have surgery °-tuberculosis, a positive skin test for tuberculosis or have recently been in close contact with someone who has tuberculosis °- °an unusual or allergic reaction to vedolizumab, other medicines, foods, dyes, or preservatives °-pregnant or trying to get pregnant °-breast-feeding °How should I use this medicine? °This medicine is for infusion into a vein. It is given by a health care professional in a hospital or clinic setting. °A special MedGuide will be given to you by the pharmacist with each prescription and refill. Be sure to read this information carefully each time. °Talk to your pediatrician regarding the use of this medicine in children. This medicine is not approved for use in children. °Overdosage: If you think you have taken too much of this medicine contact a poison control center or emergency room at once. °NOTE: This medicine is only for you. Do not share this medicine with others. °What if I miss a dose? °It is important not to miss your dose. Call your doctor or health care professional if you are unable to keep an appointment. °What may interact with this medicine? °-steroid medicines like prednisone or cortisone °-TNF-alpha inhibitors like natalizumab, adalimumab, and infliximab °-vaccines °This list may not describe all possible interactions. Give your health care  provider a list of all the medicines, herbs, non-prescription drugs, or dietary supplements you use. Also tell them if you smoke, drink alcohol, or use illegal drugs. Some items may interact with your medicine. °What should I watch for while using this medicine? °Your condition will be monitored carefully while you are receiving this medicine. Visit your doctor for regular check ups. Tell your doctor or healthcare professional if your symptoms do not start to get better or if they get worse. °Stay away from people who are sick. Call your doctor or health care professional for advice if you get a fever, chills or sore throat, or other symptoms of a cold or flu. Do not treat yourself. °In some patients, this medicine may cause a serious brain infection that may cause death. If you have any problems seeing, thinking, speaking, walking, or standing, tell your doctor right away. If you cannot reach your doctor, get urgent medical care. °What side effects may I notice from receiving this medicine? °Side effects that you should report to your doctor or health care professional as soon as possible: °-allergic reactions like skin rash, itching or hives, swelling of the face, lips, or tongue °-breathing problems °-changes in vision °-chest pain °-dark urine °-depression, feelings of sadness °-dizziness °-general ill feeling or flu-like symptoms °-irregular, missed, or painful menstrual periods °-light-colored stools °-loss of appetite, nausea °-muscle weakness °-problems with balance, talking, or walking °-right upper belly pain °-unusually weak or tired °-yellowing of the eyes or skin °Side effects that usually do not require medical attention (Report these to your doctor or health care professional if they continue or are bothersome.): °-aches, pains °-headache °-stomach upset °-tiredness °  This list may not describe all possible side effects. Call your doctor for medical advice about side effects. You may report side  effects to FDA at 1-800-FDA-1088. °Where should I keep my medicine? °This drug is given in a hospital or clinic and will not be stored at home. °NOTE: This sheet is a summary. It may not cover all possible information. If you have questions about this medicine, talk to your doctor, pharmacist, or health care provider. °  °© 2016, [employer]vier/[employer]. (2013-05-08 12:32:57) ° °

## 2016-07-14 ENCOUNTER — Telehealth: Payer: Self-pay | Admitting: Internal Medicine

## 2016-07-17 ENCOUNTER — Ambulatory Visit: Payer: BLUE CROSS/BLUE SHIELD | Admitting: Internal Medicine

## 2016-07-17 MED ORDER — ZOLPIDEM TARTRATE 10 MG PO TABS: 10.0000 mg | ORAL_TABLET | Freq: Every evening | ORAL | 1 refills | Status: DC | PRN

## 2016-07-17 NOTE — Telephone Encounter (Signed)
yes

## 2016-07-17 NOTE — Telephone Encounter (Signed)
Okay with you Dr Hilarie Fredrickson?

## 2016-07-17 NOTE — Telephone Encounter (Signed)
Rx sent 

## 2016-07-18 ENCOUNTER — Other Ambulatory Visit: Payer: Self-pay | Admitting: Internal Medicine

## 2016-07-18 DIAGNOSIS — K50019 Crohn's disease of small intestine with unspecified complications: Secondary | ICD-10-CM

## 2016-07-26 ENCOUNTER — Other Ambulatory Visit (INDEPENDENT_AMBULATORY_CARE_PROVIDER_SITE_OTHER): Payer: Self-pay

## 2016-07-26 DIAGNOSIS — K50019 Crohn's disease of small intestine with unspecified complications: Secondary | ICD-10-CM

## 2016-07-26 LAB — CBC WITH DIFFERENTIAL/PLATELET
BASOS PCT: 0.5 % (ref 0.0–3.0)
Basophils Absolute: 0 10*3/uL (ref 0.0–0.1)
EOS PCT: 2 % (ref 0.0–5.0)
Eosinophils Absolute: 0.1 10*3/uL (ref 0.0–0.7)
HCT: 38.1 % (ref 36.0–46.0)
Hemoglobin: 13 g/dL (ref 12.0–15.0)
LYMPHS ABS: 1.7 10*3/uL (ref 0.7–4.0)
Lymphocytes Relative: 30 % (ref 12.0–46.0)
MCHC: 34.2 g/dL (ref 30.0–36.0)
MCV: 90.1 fl (ref 78.0–100.0)
MONO ABS: 0.4 10*3/uL (ref 0.1–1.0)
Monocytes Relative: 7.3 % (ref 3.0–12.0)
NEUTROS PCT: 60.2 % (ref 43.0–77.0)
Neutro Abs: 3.5 10*3/uL (ref 1.4–7.7)
Platelets: 370 10*3/uL (ref 150.0–400.0)
RBC: 4.22 Mil/uL (ref 3.87–5.11)
RDW: 12.6 % (ref 11.5–15.5)
WBC: 5.8 10*3/uL (ref 4.0–10.5)

## 2016-07-26 LAB — COMPREHENSIVE METABOLIC PANEL
ALK PHOS: 51 U/L (ref 39–117)
ALT: 10 U/L (ref 0–35)
AST: 13 U/L (ref 0–37)
Albumin: 4.4 g/dL (ref 3.5–5.2)
BILIRUBIN TOTAL: 0.5 mg/dL (ref 0.2–1.2)
BUN: 10 mg/dL (ref 6–23)
CHLORIDE: 104 meq/L (ref 96–112)
CO2: 30 mEq/L (ref 19–32)
Calcium: 9.5 mg/dL (ref 8.4–10.5)
Creatinine, Ser: 0.71 mg/dL (ref 0.40–1.20)
GFR: 94.65 mL/min (ref >60.00)
GLUCOSE: 96 mg/dL (ref 70–99)
POTASSIUM: 4.1 meq/L (ref 3.5–5.1)
SODIUM: 139 meq/L (ref 135–145)
Total Protein: 7.4 g/dL (ref 6.0–8.3)

## 2016-07-29 LAB — QUANTIFERON TB GOLD ASSAY (BLOOD)
INTERFERON GAMMA RELEASE ASSAY: POSITIVE — AB
QUANTIFERON NIL VALUE: 0.03 [IU]/mL
QUANTIFERON TB AG MINUS NIL: 0.74 [IU]/mL

## 2016-08-22 ENCOUNTER — Telehealth: Payer: Self-pay | Admitting: Internal Medicine

## 2016-08-22 MED ORDER — TEMAZEPAM 30 MG PO CAPS
30.0000 mg | ORAL_CAPSULE | Freq: Every evening | ORAL | 1 refills | Status: DC | PRN
Start: 2016-08-22 — End: 2016-11-22

## 2016-08-22 NOTE — Telephone Encounter (Signed)
Rx sent 

## 2016-08-28 ENCOUNTER — Other Ambulatory Visit: Payer: Self-pay

## 2016-08-28 DIAGNOSIS — K5090 Crohn's disease, unspecified, without complications: Secondary | ICD-10-CM

## 2016-08-28 MED ORDER — VEDOLIZUMAB 300 MG IV SOLR: 300.0000 mg | Freq: Once | INTRAVENOUS | Status: DC

## 2016-08-29 ENCOUNTER — Encounter (HOSPITAL_COMMUNITY)
Admission: RE | Admit: 2016-08-29 | Discharge: 2016-08-29 | Disposition: A | Discharge status: Home / Self Care | Payer: BLUE CROSS/BLUE SHIELD | Source: Ambulatory Visit | Attending: Internal Medicine | Admitting: Internal Medicine

## 2016-08-29 DIAGNOSIS — K50919 Crohn's disease, unspecified, with unspecified complications: Secondary | ICD-10-CM | POA: Insufficient documentation

## 2016-09-07 ENCOUNTER — Other Ambulatory Visit: Payer: Self-pay

## 2016-09-07 ENCOUNTER — Telehealth: Payer: Self-pay | Admitting: Internal Medicine

## 2016-09-07 DIAGNOSIS — K50919 Crohn's disease, unspecified, with unspecified complications: Secondary | ICD-10-CM

## 2016-09-07 NOTE — Telephone Encounter (Signed)
Patient returned phone call. Best # [phone]

## 2016-09-07 NOTE — Telephone Encounter (Signed)
Left message for patient to call back. If regarding Meghan Charles has some forms from the makers of the medication that may help with financial assistance. If regarding her oral medications, I am glad to ask Dr Hilarie Fredrickson about cheaper alternatives if needed.

## 2016-09-07 NOTE — Telephone Encounter (Signed)
Spoke with pt and gave her the phone number to call and start PAP process for entyvio.

## 2016-09-07 NOTE — Telephone Encounter (Signed)
Patient states that she has not had insurance since July as she quit her previous job. She has started a new job but will not get insurance for another 2 months. She has been having several weeks of vomiting, right sided abdominal pain and loose stool (3-4 bm daily). No blood in stool. + Fever but she states she has also been sick with viral illness so she is not sure what the fever was related to. She needs help with [facility] and she also needs an alternative to budesonide for the time being. Entocort is going to be over $1,000 per month cash price. In addition, she would like something called in for pain short term as well as nausea... Dr Hilarie Fredrickson, please advise.

## 2016-09-07 NOTE — Telephone Encounter (Signed)
An [employer] case manager will assist with applying for the PAP to determine if she is eligible for any benefits under the program. She must call them at  [phone]

## 2016-09-08 ENCOUNTER — Encounter (HOSPITAL_COMMUNITY): Payer: Self-pay | Admitting: *Deleted

## 2016-09-08 ENCOUNTER — Emergency Department (HOSPITAL_COMMUNITY)
Admission: EM | Admit: 2016-09-08 | Discharge: 2016-09-08 | Disposition: A | Discharge status: Home / Self Care | Payer: Self-pay | Attending: Emergency Medicine | Admitting: Emergency Medicine

## 2016-09-08 ENCOUNTER — Other Ambulatory Visit: Payer: Self-pay

## 2016-09-08 ENCOUNTER — Emergency Department (HOSPITAL_COMMUNITY): Payer: Self-pay

## 2016-09-08 DIAGNOSIS — R1030 Lower abdominal pain, unspecified: Secondary | ICD-10-CM | POA: Insufficient documentation

## 2016-09-08 DIAGNOSIS — Z79899 Other long term (current) drug therapy: Secondary | ICD-10-CM | POA: Insufficient documentation

## 2016-09-08 DIAGNOSIS — E876 Hypokalemia: Secondary | ICD-10-CM | POA: Insufficient documentation

## 2016-09-08 DIAGNOSIS — I10 Essential (primary) hypertension: Secondary | ICD-10-CM | POA: Insufficient documentation

## 2016-09-08 LAB — URINALYSIS, ROUTINE W REFLEX MICROSCOPIC
Bilirubin Urine: NEGATIVE
GLUCOSE, UA: NEGATIVE mg/dL
HGB URINE DIPSTICK: NEGATIVE
Ketones, ur: NEGATIVE mg/dL
Leukocytes, UA: NEGATIVE
Nitrite: NEGATIVE
Protein, ur: NEGATIVE mg/dL
SPECIFIC GRAVITY, URINE: 1.041 — AB (ref 1.005–1.030)
pH: 5.5 (ref 5.0–8.0)

## 2016-09-08 LAB — COMPREHENSIVE METABOLIC PANEL
ALT: 16 U/L (ref 14–54)
ANION GAP: 10 (ref 5–15)
AST: 15 U/L (ref 15–41)
Albumin: 4.6 g/dL (ref 3.5–5.0)
Alkaline Phosphatase: 64 U/L (ref 38–126)
BUN: 19 mg/dL (ref 6–20)
CHLORIDE: 106 mmol/L (ref 101–111)
CO2: 24 mmol/L (ref 22–32)
Calcium: 9.3 mg/dL (ref 8.9–10.3)
Creatinine, Ser: 0.72 mg/dL (ref 0.44–1.00)
GFR calc non Af Amer: >60 mL/min (ref >60)
Glucose, Bld: 98 mg/dL (ref 65–99)
Potassium: 3 mmol/L — ABNORMAL LOW (ref 3.5–5.1)
SODIUM: 140 mmol/L (ref 135–145)
Total Bilirubin: 0.8 mg/dL (ref 0.3–1.2)
Total Protein: 8.3 g/dL — ABNORMAL HIGH (ref 6.5–8.1)

## 2016-09-08 LAB — LIPASE, BLOOD: LIPASE: 32 U/L (ref 11–51)

## 2016-09-08 LAB — CBC
HCT: 39.7 % (ref 36.0–46.0)
HEMOGLOBIN: 13.5 g/dL (ref 12.0–15.0)
MCH: 30.3 pg (ref 26.0–34.0)
MCHC: 34 g/dL (ref 30.0–36.0)
MCV: 89.2 fL (ref 78.0–100.0)
Platelets: 316 10*3/uL (ref 150–400)
RBC: 4.45 MIL/uL (ref 3.87–5.11)
RDW: 13.3 % (ref 11.5–15.5)
WBC: 8.9 10*3/uL (ref 4.0–10.5)

## 2016-09-08 LAB — POC URINE PREG, ED: Preg Test, Ur: NEGATIVE

## 2016-09-08 MED ORDER — POTASSIUM CHLORIDE CRYS ER 20 MEQ PO TBCR
40.0000 meq | EXTENDED_RELEASE_TABLET | Freq: Once | ORAL | Status: AC
  Administered 2016-09-08: 40 meq via ORAL
  Filled 2016-09-08: qty 2

## 2016-09-08 MED ORDER — POTASSIUM CHLORIDE CRYS ER 20 MEQ PO TBCR: 40.0000 meq | EXTENDED_RELEASE_TABLET | Freq: Every day | ORAL | 0 refills | Status: DC

## 2016-09-08 MED ORDER — SODIUM CHLORIDE 0.9 % IV BOLUS (SEPSIS)
1000.0000 mL | Freq: Once | INTRAVENOUS | Status: AC
  Administered 2016-09-08: 1000 mL via INTRAVENOUS

## 2016-09-08 MED ORDER — PROMETHAZINE HCL 25 MG/ML IJ SOLN
12.5000 mg | Freq: Once | INTRAMUSCULAR | Status: AC
  Administered 2016-09-08: 12.5 mg via INTRAVENOUS
  Filled 2016-09-08: qty 1

## 2016-09-08 MED ORDER — PROMETHAZINE HCL 25 MG PO TABS: 25.0000 mg | ORAL_TABLET | Freq: Four times a day (QID) | ORAL | 0 refills | Status: DC | PRN

## 2016-09-08 MED ORDER — ONDANSETRON 4 MG PO TBDP
4.0000 mg | ORAL_TABLET | Freq: Once | ORAL | Status: AC | PRN
  Administered 2016-09-08: 4 mg via ORAL
  Filled 2016-09-08: qty 1

## 2016-09-08 MED ORDER — HYDROCODONE-ACETAMINOPHEN 5-325 MG PO TABS: 1.0000 | ORAL_TABLET | Freq: Four times a day (QID) | ORAL | 0 refills | Status: DC | PRN

## 2016-09-08 MED ORDER — HYDROMORPHONE HCL 1 MG/ML IJ SOLN
1.0000 mg | Freq: Once | INTRAMUSCULAR | Status: AC
  Administered 2016-09-08: 1 mg via INTRAVENOUS
  Filled 2016-09-08: qty 1

## 2016-09-08 MED ORDER — PROMETHAZINE HCL 25 MG PO TABS: 25.0000 mg | ORAL_TABLET | Freq: Four times a day (QID) | ORAL | 1 refills | Status: DC | PRN

## 2016-09-08 MED ORDER — PREDNISONE 10 MG PO TABS: ORAL_TABLET | ORAL | 0 refills | Status: DC

## 2016-09-08 NOTE — ED Triage Notes (Signed)
Pt states that she has a hx of Crohn's Dx; pt states that she missed her treatment last week due to being sick; pt states that she began having abd pain, N/V yesterday that has gotten progressively worse; pt actively vomiting in triage; pt states that the pain is to her RLQ

## 2016-09-08 NOTE — ED Notes (Addendum)
Patient got nauseated while taking Potassium PO.  Will continue to monitor.

## 2016-09-08 NOTE — Telephone Encounter (Signed)
Left voicemail for patient to call back. Prednisone, KDur and BMP ordered. Vaughan Basta, can you help with stelara? Dr Hilarie Fredrickson, patient also indicated she needed pain med. Please advise.

## 2016-09-08 NOTE — Telephone Encounter (Signed)
Sorry to hear that she is sick and back in the ED Given that she is between insurance, I would recommend that we start her on a prednisone taper Prednisone 40 mg x 7 days, and down by 5 mg every 7 days until off Entyvio has been marginally effective for her and now she is overdue for infusion.   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 I would have her initiate paperwork for Stelara assistance or if she expects to have insurance soon, then we can start this process when her new insurance begins I would given her KDUR 40 meq today and tomorrow Repeat BMP Monday along Additional anti-emetics can be prescribed as needed

## 2016-09-08 NOTE — Discharge Instructions (Signed)
Take Tylenol for mild pain or the pain medicine prescribed for bad pain. Don't take Tylenol together with the pain medicine prescribed as the, no syncope dangerous to your liver. Call Dr. Hilarie Fredrickson today to schedule follow-up appointment. Return if your symptoms are not well-controlled after taking the medication prescribed. Get your blood pressure recheck within the next 3 weeks. Today's was elevated at 143/103

## 2016-09-08 NOTE — ED Provider Notes (Signed)
[facility] DEPT Provider Note   CSN: [id number] Arrival date & time: 09/08/16  0430     History   Chief Complaint Chief Complaint  Patient presents with  . Abdominal Pain    HPI Meghan Charles is a 45 y.o. female.  HPI is right lower quadrant abdominal pain typical of Crohn's flareups that she's had for the past 4 years pain onset yesterday 11 AM associated with multiple episodes of vomiting. Presently pain is moderate to severe. This is typical of Crohn's flareups that she's had in the past. Last bowel movement yesterday, normal. No urinary symptoms. She is amenorrheic due to Depo-Medrol birth control. Maximum temperature 90.9 degrees. She treated herself with over-the-counter antinausea medicine without relief. Nothing makes pain better or worse.  Past Medical History:  Diagnosis Date  . Anxiety   . Colon obstruction ([place])   . Colon polyps 05/2014   TUBULAR ADENOMA AND HYPERPLASTIC POLYP.  . Crohn's disease ([place])   . Gallbladder polyp   . Tubular adenoma of colon     Patient Active Problem List   Diagnosis Date Noted  . 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 Acute bronchitis 07/05/2015  . Cough 07/05/2015  . Protein-calorie malnutrition, severe ([place]) 05/16/2015  . Ileus, postoperative   . RLQ abdominal pain   . Small bowel obstruction ([place]) 05/03/2015  . SBO (small bowel obstruction) ([place]) 05/03/2015  . Pollen allergies 03/26/2015  . Immunosuppressed status ([place]) 03/26/2015  . Exacerbation of Crohn's disease ([place])   . High risk medication use   . Crohn's disease ([place]) 01/18/2015  . Diarrhea 01/18/2015  . Crohn disease ([place])   . Crohn's colitis ([place]) 12/26/2014  . Essential hypertension   . CAP (community acquired pneumonia) 10/02/2014  . Tachycardia 10/02/2014  . Physical deconditioning 10/02/2014  . PNA (pneumonia) 09/24/2014  . Pleural effusion 09/24/2014  . Nausea with vomiting 09/24/2014  . Leukocytosis 09/24/2014  . Sinus tachycardia ([place]) 09/24/2014  .  Dehydration 09/24/2014  . Gastroesophageal reflux disease without esophagitis 09/01/2014  . Hx of adenomatous colonic polyps 09/01/2014  . Abdominal pain 11/05/2012  . 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 Nausea and vomiting in adult 11/05/2012  . Blood loss anemia 11/05/2012    Past Surgical History:  Procedure Laterality Date  . COLON RESECTION N/A 05/11/2015   Procedure: Laparotomy with resection of Chron's Disease small bowel resection biopsy mesenteric nodule;  Surgeon: Fanny Skates, MD;  Location: [facility];  Service: General;  Laterality: N/A;  . COLONOSCOPY W/ BIOPSIES  2015  . OTHER SURGICAL HISTORY     heel surgery  . Right Heel surgery     with plates and screws  . wisdom teeth extracted      OB History    No data available       Home Medications    Prior to Admission medications   Medication Sig Start Date End Date Taking? Authorizing Provider  azaTHIOprine (IMURAN) 50 MG tablet Take 1 tablet by mouth x 2 weeks, then increase to 3 tablets (150 mg) daily thereafter 06/02/16   Jerene Bears, MD  budesonide (ENTOCORT EC) 3 MG 24 hr capsule Take 9 mg by mouth daily.    Historical Provider, MD  HYDROcodone-acetaminophen (NORCO/VICODIN) 5-325 MG tablet Take 1 tablet by mouth every 6 (six) hours as needed for severe pain. 06/06/16   Ripley Fraise, MD  naproxen sodium (ANAPROX) 220 MG tablet Take 880 mg by mouth daily as needed (pain).    Historical Provider, MD  promethazine (  PHENERGAN) 12.5 MG tablet TAKE ONE TO TWO TABLETS BY MOUTH EVERY 6 TO 8 HOURS AS NEEDED FOR NAUSEA 06/14/16   Jerene Bears, MD  promethazine (PHENERGAN) 25 MG tablet Take 1 tablet (25 mg total) by mouth every 8 (eight) hours as needed for nausea or vomiting. 06/06/16   Ripley Fraise, MD  sertraline (ZOLOFT) 50 MG tablet Take 2 tablets (100 mg total) by mouth daily. 06/14/16   Jerene Bears, MD  temazepam (RESTORIL) 30 MG capsule Take 1 capsule (30 mg total) by mouth at bedtime as needed for sleep.  08/22/16   Jerene Bears, MD  zolpidem (AMBIEN) 10 MG tablet Take 1 tablet (10 mg total) by mouth at bedtime as needed for sleep. 07/17/16 08/16/16  Jerene Bears, MD    Family History Family History  Problem Relation Age of Onset  . Colon cancer Maternal Grandmother 49  . Esophageal cancer Neg Hx   . Stomach cancer Neg Hx   . Rectal cancer Neg Hx   . Pancreatic cancer Neg Hx     Social History Social History  Substance Use Topics  . Smoking status: Never Smoker  . Smokeless tobacco: Never Used  . Alcohol use 0.0 oz/week     Comment: 3 times per week     Allergies   Morphine and related   Review of Systems Review of Systems  Constitutional: Negative.   HENT: Negative.   Respiratory: Negative.   Cardiovascular: Negative.   Gastrointestinal: Positive for abdominal pain, nausea and vomiting.  Genitourinary:       Amenorrheic  Musculoskeletal: Negative.   Skin: Negative.   Neurological: Negative.   Psychiatric/Behavioral: Negative.   All other systems reviewed and are negative.    Physical Exam Updated Vital Signs BP (!) 154/102 (BP Location: Left Arm)   Pulse 88   Temp 97.7 F (36.5 C) (Oral)   Resp 18   SpO2 100%   Physical Exam  Constitutional: She is oriented to person, place, and time. She appears well-developed and well-nourished. No distress.  HENT:  Head: Normocephalic and atraumatic.  Eyes: Conjunctivae are normal. Pupils are equal, round, and reactive to light.  Neck: Neck supple. No tracheal deviation present. No thyromegaly present.  Cardiovascular: Normal rate and regular rhythm.   No murmur heard. Pulmonary/Chest: Effort normal and breath sounds normal.  Abdominal: Soft. Bowel sounds are normal. She exhibits no distension. There is tenderness.  Midline surgical scar Tender at right lower quadrant  Musculoskeletal: Normal range of motion. She exhibits no edema or tenderness.  Neurological: She is alert and oriented to person, place, and time.  Coordination normal.  Skin: Skin is warm and dry. No rash noted.  Psychiatric: She has a normal mood and affect.  Nursing note and vitals reviewed.    ED Treatments / Results  Labs (all labs ordered are listed, but only abnormal results are displayed) Labs Reviewed  COMPREHENSIVE METABOLIC PANEL - Abnormal; Notable for the following:       Result Value   Potassium 3.0 (*)    Total Protein 8.3 (*)    All other components within normal limits  LIPASE, BLOOD  CBC  URINALYSIS, ROUTINE W REFLEX MICROSCOPIC (NOT AT [facility])  POC URINE PREG, ED   X-rays reviewed by me. EKG  EKG Interpretation None      Results for orders placed or performed during the hospital encounter of 09/08/16  Lipase, blood  Result Value Ref Range   Lipase 32 11 - 51  U/L  Comprehensive metabolic panel  Result Value Ref Range   Sodium 140 135 - 145 mmol/L   Potassium 3.0 (L) 3.5 - 5.1 mmol/L   Chloride 106 101 - 111 mmol/L   CO2 24 22 - 32 mmol/L   Glucose, Bld 98 65 - 99 mg/dL   BUN 19 6 - 20 mg/dL   Creatinine, Ser 0.72 0.44 - 1.00 mg/dL   Calcium 9.3 8.9 - 10.3 mg/dL   Total Protein 8.3 (H) 6.5 - 8.1 g/dL   Albumin 4.6 3.5 - 5.0 g/dL   AST 15 15 - 41 U/L   ALT 16 14 - 54 U/L   Alkaline Phosphatase 64 38 - 126 U/L   Total Bilirubin 0.8 0.3 - 1.2 mg/dL   GFR calc non Af Amer >60 >60 mL/min   GFR calc Af Amer >60 >60 mL/min   Anion gap 10 5 - 15  CBC  Result Value Ref Range   WBC 8.9 4.0 - 10.5 K/uL   RBC 4.45 3.87 - 5.11 MIL/uL   Hemoglobin 13.5 12.0 - 15.0 g/dL   HCT 39.7 36.0 - 46.0 %   MCV 89.2 78.0 - 100.0 fL   MCH 30.3 26.0 - 34.0 pg   MCHC 34.0 30.0 - 36.0 g/dL   RDW 13.3 11.5 - 15.5 %   Platelets 316 150 - 400 K/uL  Urinalysis, Routine w reflex microscopic  Result Value Ref Range   Color, Urine YELLOW YELLOW   APPearance CLOUDY (A) CLEAR   Specific Gravity, Urine 1.041 (H) 1.005 - 1.030   pH 5.5 5.0 - 8.0   Glucose, UA NEGATIVE NEGATIVE mg/dL   Hgb urine dipstick NEGATIVE  NEGATIVE   Bilirubin Urine NEGATIVE NEGATIVE   Ketones, ur NEGATIVE NEGATIVE mg/dL   Protein, ur NEGATIVE NEGATIVE mg/dL   Nitrite NEGATIVE NEGATIVE   Leukocytes, UA NEGATIVE NEGATIVE  POC urine preg, ED  Result Value Ref Range   Preg Test, Ur NEGATIVE NEGATIVE   Dg Abd Acute W/chest  Result Date: 09/08/2016 CLINICAL DATA:  Right lower quadrant pain and vomiting. EXAM: DG ABDOMEN ACUTE W/ 1V CHEST COMPARISON:  Chest x-ray 07/05/2015 FINDINGS: Biapical opacity with volume loss causing upward retraction of the hila. Pattern is stable from prior and there is no evidence of superimposed pneumonia or edema. This pattern with septal nodular appearance by CT could be from sarcoid, previous granulomatous infection, or associated with spondyloarthropathy in this patient with history of Crohn's disease. No effusion or pneumothorax. Stable asymmetric right apical thickening. Pulmonary vessels appear prominent at the hila, but stable from prior and no noted dilatation and pruning on 2016 chest CT to increase concern for pulmonary hypertension. Normal heart size. Nonobstructive bowel gas pattern. No concerning mass effect or calcification. No pneumoperitoneum. IMPRESSION: 1. No evidence of acute disease.  Normal bowel gas pattern. 2. Chronic lung disease as described. Electronically Signed   By: Monte Fantasia M.D.   On: 09/08/2016 08:19   Radiology No results found.  Procedures Procedures (including critical care time)  Medications Ordered in ED Medications  HYDROmorphone (DILAUDID) injection 1 mg (not administered)  promethazine (PHENERGAN) injection 12.5 mg (not administered)  ondansetron (ZOFRAN-ODT) disintegrating tablet 4 mg (4 mg Oral Given 09/08/16 0515)     Initial Impression / Assessment and Plan / ED Course  I have reviewed the triage vital signs and the nursing notes.  Pertinent labs & imaging results that were available during my care of the patient were reviewed by me and considered  in my medical decision making (see chart for details).  Clinical Course    10:10 AM patient's pain and nausea under control and she is able to drink water without nausea or vomiting. After treatment with intravenous opioids, IV antiemetics and oral potassium She feels ready to go home. Case discussed with Dr.Danis from [place]. Plan prescriptions Norco, Phenergan. Follow up with Dr.Pyrtle as outpatient. She is invited to return to the emergency department if pain or nausea not well controlled with medications prescribed. Blood pressure recheck 3 weeks Final Clinical Impressions(s) / ED Diagnoses  Diagnoses #1 Crohn's disease exacerbation #2 abdominal pain #3 nausea and vomiting #4 hypokalemia #5 elevated blood pressure Final diagnoses:  None   xrays viewed by me New Prescriptions New Prescriptions   No medications on file     Orlie Dakin, MD 09/08/16 1731

## 2016-09-08 NOTE — Telephone Encounter (Signed)
Pt states she should have insurance in 2 months.

## 2016-09-08 NOTE — Addendum Note (Signed)
Addended by: Larina Bras on: 09/08/2016 12:44 PM   Modules accepted: Orders

## 2016-09-13 ENCOUNTER — Telehealth: Payer: Self-pay | Admitting: Internal Medicine

## 2016-09-13 NOTE — Telephone Encounter (Signed)
Left message for pt to call back. Per Dr. Hilarie Fredrickson he would like the pt to start on Stelara.

## 2016-09-13 NOTE — Telephone Encounter (Signed)
Spoke with pt and discussed with her that Dr. Hilarie Fredrickson wants to change her to [facility]. Pt knows to contact us asap when she has insurance coverage in 2 mths.

## 2016-09-14 ENCOUNTER — Telehealth: Payer: Self-pay | Admitting: Internal Medicine

## 2016-09-14 NOTE — Telephone Encounter (Signed)
Left message for pt to call back  °

## 2016-09-15 ENCOUNTER — Encounter (HOSPITAL_COMMUNITY): Admission: RE | Admit: 2016-09-15 | Payer: BLUE CROSS/BLUE SHIELD | Source: Ambulatory Visit

## 2016-09-19 NOTE — Telephone Encounter (Signed)
See additional phone note. 

## 2016-09-21 ENCOUNTER — Telehealth: Payer: Self-pay | Admitting: Internal Medicine

## 2016-09-21 NOTE — Telephone Encounter (Signed)
Patient already has temazepam on file at [facility]. They will fill. Patient also requests medication for cramping. She has tried both dicyclomine and levsin in the past and has no preference. Dr Hilarie Fredrickson, is there one medication you would rather me send over the other for her?

## 2016-09-25 ENCOUNTER — Emergency Department (HOSPITAL_COMMUNITY)
Admission: EM | Admit: 2016-09-25 | Discharge: 2016-09-25 | Disposition: A | Discharge status: Home / Self Care | Payer: Self-pay | Attending: Emergency Medicine | Admitting: Emergency Medicine

## 2016-09-25 ENCOUNTER — Telehealth: Payer: Self-pay | Admitting: Internal Medicine

## 2016-09-25 ENCOUNTER — Encounter (HOSPITAL_COMMUNITY): Payer: Self-pay | Admitting: Emergency Medicine

## 2016-09-25 ENCOUNTER — Emergency Department (HOSPITAL_COMMUNITY): Payer: Self-pay

## 2016-09-25 ENCOUNTER — Other Ambulatory Visit: Payer: Self-pay

## 2016-09-25 DIAGNOSIS — R112 Nausea with vomiting, unspecified: Secondary | ICD-10-CM | POA: Insufficient documentation

## 2016-09-25 DIAGNOSIS — I10 Essential (primary) hypertension: Secondary | ICD-10-CM | POA: Insufficient documentation

## 2016-09-25 DIAGNOSIS — R1031 Right lower quadrant pain: Secondary | ICD-10-CM | POA: Insufficient documentation

## 2016-09-25 DIAGNOSIS — Z79899 Other long term (current) drug therapy: Secondary | ICD-10-CM | POA: Insufficient documentation

## 2016-09-25 LAB — CBC
HEMATOCRIT: 42.2 % (ref 36.0–46.0)
Hemoglobin: 14.1 g/dL (ref 12.0–15.0)
MCH: 30.6 pg (ref 26.0–34.0)
MCHC: 33.4 g/dL (ref 30.0–36.0)
MCV: 91.5 fL (ref 78.0–100.0)
PLATELETS: 356 10*3/uL (ref 150–400)
RBC: 4.61 MIL/uL (ref 3.87–5.11)
RDW: 14.3 % (ref 11.5–15.5)
WBC: 15.9 10*3/uL — ABNORMAL HIGH (ref 4.0–10.5)

## 2016-09-25 LAB — COMPREHENSIVE METABOLIC PANEL
ALBUMIN: 4.6 g/dL (ref 3.5–5.0)
ALK PHOS: 45 U/L (ref 38–126)
ALT: 20 U/L (ref 14–54)
AST: 19 U/L (ref 15–41)
Anion gap: 10 (ref 5–15)
BUN: 18 mg/dL (ref 6–20)
CALCIUM: 9.9 mg/dL (ref 8.9–10.3)
CHLORIDE: 102 mmol/L (ref 101–111)
CO2: 25 mmol/L (ref 22–32)
CREATININE: 0.83 mg/dL (ref 0.44–1.00)
GFR calc Af Amer: >60 mL/min (ref >60)
GFR calc non Af Amer: >60 mL/min (ref >60)
GLUCOSE: 137 mg/dL — AB (ref 65–99)
Potassium: 3.7 mmol/L (ref 3.5–5.1)
SODIUM: 137 mmol/L (ref 135–145)
Total Bilirubin: 1 mg/dL (ref 0.3–1.2)
Total Protein: 8 g/dL (ref 6.5–8.1)

## 2016-09-25 LAB — URINALYSIS, ROUTINE W REFLEX MICROSCOPIC
Bilirubin Urine: NEGATIVE
GLUCOSE, UA: NEGATIVE mg/dL
HGB URINE DIPSTICK: NEGATIVE
Ketones, ur: NEGATIVE mg/dL
LEUKOCYTES UA: NEGATIVE
Nitrite: NEGATIVE
PH: 7.5 (ref 5.0–8.0)
PROTEIN: NEGATIVE mg/dL
SPECIFIC GRAVITY, URINE: 1.016 (ref 1.005–1.030)

## 2016-09-25 LAB — POC URINE PREG, ED: Preg Test, Ur: NEGATIVE

## 2016-09-25 LAB — LIPASE, BLOOD: LIPASE: 31 U/L (ref 11–51)

## 2016-09-25 MED ORDER — AZATHIOPRINE 50 MG PO TABS: 50.0000 mg | ORAL_TABLET | Freq: Every day | ORAL | 0 refills | Status: DC

## 2016-09-25 MED ORDER — PROMETHAZINE HCL 25 MG/ML IJ SOLN
25.0000 mg | Freq: Once | INTRAMUSCULAR | Status: AC
  Administered 2016-09-25: 25 mg via INTRAVENOUS
  Filled 2016-09-25: qty 1

## 2016-09-25 MED ORDER — IOPAMIDOL (ISOVUE-300) INJECTION 61%
30.0000 mL | Freq: Once | INTRAVENOUS | Status: AC | PRN
  Administered 2016-09-25: 30 mL via ORAL

## 2016-09-25 MED ORDER — SODIUM CHLORIDE 0.9 % IV BOLUS (SEPSIS)
1000.0000 mL | Freq: Once | INTRAVENOUS | Status: AC
  Administered 2016-09-25: 1000 mL via INTRAVENOUS

## 2016-09-25 MED ORDER — PREDNISONE 20 MG PO TABS: 60.0000 mg | ORAL_TABLET | Freq: Every day | ORAL | 0 refills | Status: DC

## 2016-09-25 MED ORDER — HYDROMORPHONE HCL 1 MG/ML IJ SOLN
1.0000 mg | Freq: Once | INTRAMUSCULAR | Status: AC
  Administered 2016-09-25: 1 mg via INTRAVENOUS
  Filled 2016-09-25: qty 1

## 2016-09-25 MED ORDER — OXYCODONE-ACETAMINOPHEN 5-325 MG PO TABS: 1.0000 | ORAL_TABLET | Freq: Four times a day (QID) | ORAL | 0 refills | Status: DC | PRN

## 2016-09-25 MED ORDER — IOPAMIDOL (ISOVUE-300) INJECTION 61%
100.0000 mL | Freq: Once | INTRAVENOUS | Status: AC | PRN
  Administered 2016-09-25: 100 mL via INTRAVENOUS

## 2016-09-25 NOTE — Discharge Instructions (Signed)
As discussed, today's evaluation has been largely reassuring, but is important that you continue to have your condition evaluated as an outpatient.  Please take all medication as directed, and be sure to follow-up with her gastroenterologist.  In addition, you may elect to follow-up at the specialty clinics at [facility] or [facility].  Return here for concerning changes in your condition.

## 2016-09-25 NOTE — ED Triage Notes (Signed)
Patient c/o RLQ pain and vomiting that started today. Patient unable to get into see PCP to get medications. Patient has Crohns.

## 2016-09-25 NOTE — Telephone Encounter (Signed)
Pt advised to go to the ER as there are no APP appts available. Pt verbalized understanding.

## 2016-09-25 NOTE — ED Provider Notes (Signed)
[facility] DEPT Provider Note   CSN: [id number] Arrival date & time: 09/25/16  1330     History   Chief Complaint Chief Complaint  Patient presents with  . Abdominal Pain  . Emesis    HPI Meghan Charles is a 45 y.o. female.  HPI Patient presents with concern of right lower quadrant abdominal pain, nausea, vomiting. She has multiple medical issues including Crohn's disease. Patient also has multiple prior abdominal surgery. This episode began over the past day, and since onset she has had persistent nausea, anorexia, vomiting, severe, sore, nonradiating pain in the right lower quadrant. Patient has no medication at home for relief, spoke with her gastroenterologist, was referred here for evaluation.  Past Medical History:  Diagnosis Date  . Anxiety   . Colon obstruction   . Colon polyps 05/2014   TUBULAR ADENOMA AND HYPERPLASTIC POLYP.  . Crohn's disease ([place])   . Gallbladder polyp   . Tubular adenoma of colon     Patient Active Problem List   Diagnosis Date Noted  . 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 Acute bronchitis 07/05/2015  . Cough 07/05/2015  . Protein-calorie malnutrition, severe ([place]) 05/16/2015  . Ileus, postoperative ([place])   . RLQ abdominal pain   . Small bowel obstruction 05/03/2015  . SBO (small bowel obstruction) 05/03/2015  . Pollen allergies 03/26/2015  . Immunosuppressed status ([place]) 03/26/2015  . Exacerbation of Crohn's disease ([place])   . High risk medication use   . Crohn's disease ([place]) 01/18/2015  . Diarrhea 01/18/2015  . Crohn disease ([place])   . Crohn's colitis ([place]) 12/26/2014  . Essential hypertension   . CAP (community acquired pneumonia) 10/02/2014  . Tachycardia 10/02/2014  . Physical deconditioning 10/02/2014  . PNA (pneumonia) 09/24/2014  . Pleural effusion 09/24/2014  . Nausea with vomiting 09/24/2014  . Leukocytosis 09/24/2014  . Sinus tachycardia 09/24/2014  . Dehydration 09/24/2014  . Gastroesophageal reflux disease without  esophagitis 09/01/2014  . Hx of adenomatous colonic polyps 09/01/2014  . Abdominal pain 11/05/2012  . 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 Nausea and vomiting in adult 11/05/2012  . Blood loss anemia 11/05/2012    Past Surgical History:  Procedure Laterality Date  . COLON RESECTION N/A 05/11/2015   Procedure: Laparotomy with resection of Chron's Disease small bowel resection biopsy mesenteric nodule;  Surgeon: Fanny Skates, MD;  Location: [facility] ORS;  Service: General;  Laterality: N/A;  . COLONOSCOPY W/ BIOPSIES  2015  . OTHER SURGICAL HISTORY     heel surgery  . Right Heel surgery     with plates and screws  . wisdom teeth extracted      OB History    No data available       Home Medications    Prior to Admission medications   Medication Sig Start Date End Date Taking? Authorizing Provider  azaTHIOprine (IMURAN) 50 MG tablet Take 1 tablet by mouth x 2 weeks, then increase to 3 tablets (150 mg) daily thereafter Patient not taking: Reported on 09/08/2016 06/02/16   Jerene Bears, MD  HYDROcodone-acetaminophen (NORCO) 5-325 MG tablet Take 1-2 tablets by mouth every 6 (six) hours as needed for moderate pain or severe pain. 09/08/16   Orlie Dakin, MD  loratadine-pseudoephedrine (CLARITIN-D 24-HOUR) 10-240 MG 24 hr tablet Take 1 tablet by mouth daily as needed for allergies.    Historical Provider, MD  naproxen sodium (ANAPROX) 220 MG tablet Take 880 mg by mouth daily as needed (pain).    Historical Provider, MD  Polyvinyl Alcohol-Povidone (CLEAR EYES ALL SEASONS) 5-6 MG/ML SOLN Place 2 drops into both eyes daily as needed (For allergies.).    Historical Provider, MD  potassium chloride SA (K-DUR,KLOR-CON) 20 MEQ tablet Take 2 tablets (40 mEq total) by mouth daily. 09/08/16 09/10/16  Jerene Bears, MD  predniSONE (DELTASONE) 10 MG tablet Take as directed 09/08/16   Jerene Bears, MD  promethazine (PHENERGAN) 25 MG tablet Take 1 tablet (25 mg total) by mouth every 6 (six) hours as  needed for nausea or vomiting. 09/08/16   Orlie Dakin, MD  promethazine (PHENERGAN) 25 MG tablet Take 1 tablet (25 mg total) by mouth every 6 (six) hours as needed for nausea or vomiting. 09/08/16   Jerene Bears, MD  sertraline (ZOLOFT) 50 MG tablet Take 2 tablets (100 mg total) by mouth daily. Patient not taking: Reported on 09/08/2016 06/14/16   Jerene Bears, MD  temazepam (RESTORIL) 30 MG capsule Take 1 capsule (30 mg total) by mouth at bedtime as needed for sleep. Patient not taking: Reported on 09/08/2016 08/22/16   Jerene Bears, MD  vedolizumab (ENTYVIO) 300 MG injection Inject 300 mg into the vein See admin instructions. Every 8 weeks. She receives at the [facility].    Historical Provider, MD  zolpidem (AMBIEN) 10 MG tablet Take 1 tablet (10 mg total) by mouth at bedtime as needed for sleep. 07/17/16 09/08/16  Jerene Bears, MD    Family History Family History  Problem Relation Age of Onset  . Colon cancer Maternal Grandmother 26  . Esophageal cancer Neg Hx   . Stomach cancer Neg Hx   . Rectal cancer Neg Hx   . Pancreatic cancer Neg Hx     Social History Social History  Substance Use Topics  . Smoking status: Never Smoker  . Smokeless tobacco: Never Used  . Alcohol use 0.0 oz/week     Comment: 3 times per week     Allergies   Morphine and related   Review of Systems Review of Systems  Constitutional:       Per HPI, otherwise negative  HENT:       Per HPI, otherwise negative  Respiratory:       Per HPI, otherwise negative  Cardiovascular:       Per HPI, otherwise negative  Gastrointestinal: Positive for abdominal pain, nausea and vomiting.  Endocrine:       Negative aside from HPI  Genitourinary:       Neg aside from HPI   Musculoskeletal:       Per HPI, otherwise negative  Skin: Negative.   Allergic/Immunologic: Positive for immunocompromised state.       Currently prednisone 35 mg daily  Neurological: Positive for weakness. Negative for  syncope.     Physical Exam Updated Vital Signs BP (!) 163/110 (BP Location: Left Arm)   Pulse (!) 122   Temp 98.3 F (36.8 C) (Oral)   Resp 18   SpO2 96%   Physical Exam  Constitutional: She is oriented to person, place, and time. She appears well-developed and well-nourished. No distress.  HENT:  Head: Normocephalic and atraumatic.  Eyes: Conjunctivae and EOM are normal.  Cardiovascular: Normal rate and regular rhythm.   Pulmonary/Chest: Effort normal and breath sounds normal. No stridor. No respiratory distress.  Abdominal: She exhibits no distension.  Tender to palpation, right lower quadrant, mild guarding, otherwise unremarkable abdominal exam  Musculoskeletal: She exhibits no edema.  Neurological: She is alert  and oriented to person, place, and time. No cranial nerve deficit.  Skin: Skin is warm and dry.  Psychiatric: She has a normal mood and affect.  Nursing note and vitals reviewed.   Chart review notable for multiple ED visits, prior operative repair last year, bowel obstruction, inflammation.  ED Treatments / Results  Labs (all labs ordered are listed, but only abnormal results are displayed) Labs Reviewed  COMPREHENSIVE METABOLIC PANEL - Abnormal; Notable for the following:       Result Value   Glucose, Bld 137 (*)    All other components within normal limits  CBC - Abnormal; Notable for the following:    WBC 15.9 (*)    All other components within normal limits  LIPASE, BLOOD  URINALYSIS, ROUTINE W REFLEX MICROSCOPIC (NOT AT [facility])     Radiology Ct Abdomen Pelvis W Contrast  Result Date: 09/25/2016 CLINICAL DATA:  Right lower quadrant pain and vomiting EXAM: CT ABDOMEN AND PELVIS WITH CONTRAST TECHNIQUE: Multidetector CT imaging of the abdomen and pelvis was performed using the standard protocol following bolus administration of intravenous contrast. CONTRAST:  146m ISOVUE-300 IOPAMIDOL (ISOVUE-300) INJECTION 61%, 380mISOVUE-300 IOPAMIDOL (ISOVUE-300)  INJECTION 61% COMPARISON:  MRI 03/14/2016, CT abdomen pelvis 05/03/2015 FINDINGS: Lower chest: Lung bases clear and without acute infiltrate or pleural effusion. There are partially visualized bilateral breast prostheses. Heart size is nonenlarged. No pericardial effusion at the lung bases. Hepatobiliary: No focal liver abnormality is seen. No gallstones, gallbladder wall thickening, or biliary dilatation. Pancreas: Unremarkable. No pancreatic ductal dilatation or surrounding inflammatory changes. Spleen: Normal in size without focal abnormality. Adrenals/Urinary Tract: Diminutive left adrenal gland. Stable right adrenal gland nodule measuring 2.6 cm, presumed adenoma. Kidneys demonstrate normal enhancement and no hydronephrosis. The urinary bladder is unremarkable. Stomach/Bowel: The stomach is nondilated. There is a single loop of dilated small bowel within the anterior abdomen at the level of the umbilicus, this measures 4.5 cm in AP diameter, series 2, image number 49. Degree of bowel dilatation has decreased compared to previous MRI. There are postsurgical changes at the left aspect of the dilated loop of bowel. No upstream dilated segments of small bowel are seen. There is migration of contrast distal to the focally dilated loop of small bowel. No definite wall thickening. The appendix is visualized and is within normal limits. No significant right lower quadrant inflammatory changes are visualized. Vascular/Lymphatic: Aorta is non aneurysmal. No pathologically enlarged retroperitoneal or mesenteric lymph nodes. Reproductive: Visualized and grossly unremarkable. Other: No free air or free fluid. Musculoskeletal: No acute osseous abnormality. IMPRESSION: 1. Isolated loop of dilated small bowel in the anterior abdomen at the level of umbilicus, slightly increased in size compared to previous MRI. No definite wall thickening to suggest acute inflammation. No dilated small bowel is seen upstream to this, and there  is migration of contrast past the dilated segment of small bowel, suggesting that there is no obstruction. 2. Normal appearance of the appendix. No significant right lower quadrant inflammatory process identified. 3. Stable right adrenal gland nodule, presumably adenoma. Electronically Signed   By: KiDonavan Foil.D.   On: 09/25/2016 17:58    Procedures Procedures (including critical care time)  Medications Ordered in ED Medications  sodium chloride 0.9 % bolus 1,000 mL (not administered)  HYDROmorphone (DILAUDID) injection 1 mg (not administered)  promethazine (PHENERGAN) injection 25 mg (not administered)     Initial Impression / Assessment and Plan / ED Course  I have reviewed the triage vital signs and the  nursing notes.  Pertinent labs & imaging results that were available during my care of the patient were reviewed by me and considered in my medical decision making (see chart for details).  Clinical Course    6:07 PM Patient awake, alert, aware of all findings per We had a lengthy conversation about her Crohn's disease, her persistent symptoms, and today's reassuring CT scan para 1 we discussed initiation of symptomatic control medications, and the idea of following up at a specialty clinic, [facility] or [facility]. With reassuring findings otherwise, patient appropriate for outpatient follow-up.    Carmin Muskrat, MD 09/25/16 [phone]

## 2016-09-25 NOTE — ED Notes (Signed)
Patient transported to CT 

## 2016-09-26 MED ORDER — HYOSCYAMINE SULFATE SL 0.125 MG SL SUBL: SUBLINGUAL_TABLET | SUBLINGUAL | 2 refills | Status: DC

## 2016-09-26 NOTE — Telephone Encounter (Signed)
levsin 0.25 every 4-6 hrs PRN  or  Levbid every 12 hours   What is her insurance situation We need to resume biologics

## 2016-09-26 NOTE — Telephone Encounter (Signed)
Patient advised that we will send levsin to pharmacy for her. She states that it will probably be 2 more months before she is able to get insurance. She wanted to also make Dr Hilarie Fredrickson aware that she was in the ER yesterday as recommended by our office.

## 2016-09-27 ENCOUNTER — Encounter (HOSPITAL_COMMUNITY): Payer: Self-pay | Admitting: *Deleted

## 2016-09-27 ENCOUNTER — Encounter: Payer: Self-pay | Admitting: Internal Medicine

## 2016-09-27 ENCOUNTER — Inpatient Hospital Stay (HOSPITAL_COMMUNITY)
Admission: AD | Admit: 2016-09-27 | Discharge: 2016-09-30 | DRG: 386 | Disposition: A | Discharge status: Home / Self Care | Payer: Self-pay | Source: Ambulatory Visit | Attending: Nephrology | Admitting: Nephrology

## 2016-09-27 ENCOUNTER — Ambulatory Visit (INDEPENDENT_AMBULATORY_CARE_PROVIDER_SITE_OTHER): Payer: Self-pay | Admitting: Internal Medicine

## 2016-09-27 VITALS — BP 132/86 | HR 101 | Ht 66.0 in | Wt 165.5 lb

## 2016-09-27 DIAGNOSIS — Z86010 Personal history of colonic polyps: Secondary | ICD-10-CM

## 2016-09-27 DIAGNOSIS — K56699 Other intestinal obstruction unspecified as to partial versus complete obstruction: Secondary | ICD-10-CM | POA: Diagnosis present

## 2016-09-27 DIAGNOSIS — K5090 Crohn's disease, unspecified, without complications: Secondary | ICD-10-CM | POA: Diagnosis present

## 2016-09-27 DIAGNOSIS — K50019 Crohn's disease of small intestine with unspecified complications: Secondary | ICD-10-CM

## 2016-09-27 DIAGNOSIS — K219 Gastro-esophageal reflux disease without esophagitis: Secondary | ICD-10-CM | POA: Diagnosis present

## 2016-09-27 DIAGNOSIS — R109 Unspecified abdominal pain: Secondary | ICD-10-CM | POA: Diagnosis present

## 2016-09-27 DIAGNOSIS — Z79899 Other long term (current) drug therapy: Secondary | ICD-10-CM

## 2016-09-27 DIAGNOSIS — K5000 Crohn's disease of small intestine without complications: Principal | ICD-10-CM | POA: Diagnosis present

## 2016-09-27 DIAGNOSIS — Z7952 Long term (current) use of systemic steroids: Secondary | ICD-10-CM

## 2016-09-27 DIAGNOSIS — R112 Nausea with vomiting, unspecified: Secondary | ICD-10-CM | POA: Diagnosis present

## 2016-09-27 DIAGNOSIS — Z885 Allergy status to narcotic agent status: Secondary | ICD-10-CM

## 2016-09-27 DIAGNOSIS — R1031 Right lower quadrant pain: Secondary | ICD-10-CM | POA: Diagnosis present

## 2016-09-27 DIAGNOSIS — I10 Essential (primary) hypertension: Secondary | ICD-10-CM | POA: Diagnosis present

## 2016-09-27 DIAGNOSIS — E876 Hypokalemia: Secondary | ICD-10-CM | POA: Diagnosis present

## 2016-09-27 DIAGNOSIS — Z800 Family history of malignant neoplasm of digestive organs: Secondary | ICD-10-CM

## 2016-09-27 DIAGNOSIS — K56600 Partial intestinal obstruction, unspecified as to cause: Secondary | ICD-10-CM

## 2016-09-27 HISTORY — DX: Crohn's disease of small intestine with unspecified complications: K50.019

## 2016-09-27 LAB — COMPREHENSIVE METABOLIC PANEL
ALBUMIN: 4.4 g/dL (ref 3.5–5.0)
ALK PHOS: 44 U/L (ref 38–126)
ALT: 19 U/L (ref 14–54)
AST: 22 U/L (ref 15–41)
Anion gap: 13 (ref 5–15)
BILIRUBIN TOTAL: 0.5 mg/dL (ref 0.3–1.2)
BUN: 25 mg/dL — AB (ref 6–20)
CALCIUM: 10.1 mg/dL (ref 8.9–10.3)
CO2: 28 mmol/L (ref 22–32)
Chloride: 100 mmol/L — ABNORMAL LOW (ref 101–111)
Creatinine, Ser: 0.88 mg/dL (ref 0.44–1.00)
GFR calc Af Amer: >60 mL/min (ref >60)
GFR calc non Af Amer: >60 mL/min (ref >60)
GLUCOSE: 139 mg/dL — AB (ref 65–99)
Potassium: 3.3 mmol/L — ABNORMAL LOW (ref 3.5–5.1)
Sodium: 141 mmol/L (ref 135–145)
TOTAL PROTEIN: 7.7 g/dL (ref 6.5–8.1)

## 2016-09-27 LAB — CBC
HCT: 38.3 % (ref 36.0–46.0)
Hemoglobin: 12.6 g/dL (ref 12.0–15.0)
MCH: 30.4 pg (ref 26.0–34.0)
MCHC: 32.9 g/dL (ref 30.0–36.0)
MCV: 92.3 fL (ref 78.0–100.0)
Platelets: 292 10*3/uL (ref 150–400)
RBC: 4.15 MIL/uL (ref 3.87–5.11)
RDW: 14.2 % (ref 11.5–15.5)
WBC: 8.3 10*3/uL (ref 4.0–10.5)

## 2016-09-27 LAB — C-REACTIVE PROTEIN

## 2016-09-27 MED ORDER — ONDANSETRON HCL 4 MG PO TABS: 4.0000 mg | ORAL_TABLET | Freq: Four times a day (QID) | ORAL | Status: DC | PRN

## 2016-09-27 MED ORDER — HYDROMORPHONE HCL 1 MG/ML IJ SOLN
0.5000 mg | Freq: Once | INTRAMUSCULAR | Status: AC
Start: 2016-09-27 — End: 2016-09-27
  Administered 2016-09-27: 0.5 mg via INTRAVENOUS

## 2016-09-27 MED ORDER — HYDROMORPHONE HCL 1 MG/ML IJ SOLN
0.5000 mg | INTRAMUSCULAR | Status: DC | PRN
  Administered 2016-09-27 – 2016-09-29 (×9): 1 mg via INTRAVENOUS
  Filled 2016-09-27 (×9): qty 1

## 2016-09-27 MED ORDER — PROMETHAZINE HCL 25 MG/ML IJ SOLN
12.5000 mg | Freq: Four times a day (QID) | INTRAMUSCULAR | Status: DC | PRN
  Administered 2016-09-27: 12.5 mg via INTRAVENOUS
  Filled 2016-09-27 (×2): qty 1

## 2016-09-27 MED ORDER — OXYCODONE HCL 5 MG PO TABS
5.0000 mg | ORAL_TABLET | ORAL | Status: DC | PRN
Start: 2016-09-27 — End: 2016-09-30
  Administered 2016-09-30 (×2): 5 mg via ORAL
  Filled 2016-09-27 (×3): qty 1

## 2016-09-27 MED ORDER — HYDRALAZINE HCL 20 MG/ML IJ SOLN
5.0000 mg | Freq: Four times a day (QID) | INTRAMUSCULAR | Status: DC | PRN
  Administered 2016-09-27 – 2016-09-29 (×2): 5 mg via INTRAVENOUS
  Filled 2016-09-27 (×2): qty 1

## 2016-09-27 MED ORDER — ADULT MULTIVITAMIN W/MINERALS CH
1.0000 | ORAL_TABLET | Freq: Every day | ORAL | Status: DC
  Administered 2016-09-27 – 2016-09-30 (×4): 1 via ORAL
  Filled 2016-09-27 (×5): qty 1

## 2016-09-27 MED ORDER — ACETAMINOPHEN 325 MG PO TABS: 650.0000 mg | ORAL_TABLET | Freq: Four times a day (QID) | ORAL | Status: DC | PRN

## 2016-09-27 MED ORDER — METHYLPREDNISOLONE SODIUM SUCC 40 MG IJ SOLR
40.0000 mg | Freq: Every day | INTRAMUSCULAR | Status: DC
  Administered 2016-09-27 – 2016-09-30 (×4): 40 mg via INTRAVENOUS
  Filled 2016-09-27 (×5): qty 1

## 2016-09-27 MED ORDER — HYOSCYAMINE SULFATE 0.125 MG SL SUBL
0.2500 mg | SUBLINGUAL_TABLET | SUBLINGUAL | Status: DC | PRN
  Filled 2016-09-27: qty 2

## 2016-09-27 MED ORDER — INFLUENZA VAC SPLIT QUAD 0.5 ML IM SUSY: 0.5000 mL | PREFILLED_SYRINGE | INTRAMUSCULAR | Status: DC | PRN

## 2016-09-27 MED ORDER — HEPARIN SODIUM (PORCINE) 5000 UNIT/ML IJ SOLN
5000.0000 [IU] | Freq: Three times a day (TID) | INTRAMUSCULAR | Status: DC
  Administered 2016-09-27 – 2016-09-28 (×3): 5000 [IU] via SUBCUTANEOUS
  Filled 2016-09-27 (×3): qty 1

## 2016-09-27 MED ORDER — ONDANSETRON HCL 4 MG/2ML IJ SOLN
4.0000 mg | Freq: Four times a day (QID) | INTRAMUSCULAR | Status: DC | PRN
  Administered 2016-09-27 – 2016-09-29 (×4): 4 mg via INTRAVENOUS
  Filled 2016-09-27 (×4): qty 2

## 2016-09-27 MED ORDER — HYDROMORPHONE HCL 1 MG/ML IJ SOLN
0.5000 mg | INTRAMUSCULAR | Status: DC | PRN
  Administered 2016-09-27 (×2): 0.5 mg via INTRAVENOUS
  Filled 2016-09-27 (×3): qty 1

## 2016-09-27 MED ORDER — ACETAMINOPHEN 650 MG RE SUPP: 650.0000 mg | Freq: Four times a day (QID) | RECTAL | Status: DC | PRN

## 2016-09-27 MED ORDER — SODIUM CHLORIDE 0.9 % IV SOLN
INTRAVENOUS | Status: DC
  Administered 2016-09-27 – 2016-09-29 (×3): via INTRAVENOUS

## 2016-09-27 MED ORDER — PANTOPRAZOLE SODIUM 40 MG PO TBEC
40.0000 mg | DELAYED_RELEASE_TABLET | Freq: Every day | ORAL | Status: DC
  Administered 2016-09-27 – 2016-09-30 (×4): 40 mg via ORAL
  Filled 2016-09-27 (×5): qty 1

## 2016-09-27 MED ORDER — MORPHINE SULFATE (PF) 2 MG/ML IV SOLN: 2.0000 mg | INTRAVENOUS | Status: DC | PRN

## 2016-09-27 NOTE — Telephone Encounter (Signed)
Seen in office and admitted today

## 2016-09-27 NOTE — Consult Note (Signed)
[facility] Surgery Consult Note  Meghan Charles Jan 22, 1971  [id number].    Requesting MD: Pyrtle Chief Complaint/Reason for Consult: Right sided abdominal pain  HPI:  Meghan Charles  is a 45yo female PMH Crohns ileitis who was admitted to [facility] today due to recurrent right sided abdominal pain. Patient underwent Exploratory laparotomy with segmental resection of 12 inches of mid ileum with primary anastomosis by Dr. Dalbert Batman 04/2015. She has been followed by Dr. Hilarie Fredrickson. States that she has been struggling with this recurrent right-sided abdominal pain for several months. Previously she was on Entyvio but did not improve with this medication. She had an adverse reaction to Remicade and ended up in the ICU, therefore they are trying to avoid TNF's. She has been on prednisone now for 2-3 weeks and felt some relief initially but overall no significant change, and she would prefer to get off this medication.  This pain with intermittent nausea and vomiting started back in February of this year. At times it is crampy and other times sharp. She has noticed visible swelling in her right sided abdomen. She also reports intermittent episodes of diarrhea. Denies melena, hematochezia, or constipation. She has been able to tolerate a diet and does not feel that PO intake causes her n/v.  Last meal this morning was a shake Last BM yesterday  Latest CT scan 09/25/16 showed isolated loop of dilated small bowel in the anterior abdomen at the level of umbilicus, no definite wall thickening to suggest acute inflammation, and no full obstruction. Her WBC is normal and she is afebrile.  PMH significant for Crohns ileitis Abdominal surgical history includes Exploratory laparotomy with segmental resection of 12 inches of mid ileum with primary anastomosis by Dr. Dalbert Batman 04/2015 Employment: property management  ROS: All systems reviewed and otherwise negative except for as above  Family History  Problem Relation  Age of Onset  . Colon cancer Maternal Grandmother 51  . Esophageal cancer Neg Hx   . Stomach cancer Neg Hx   . Rectal cancer Neg Hx   . Pancreatic cancer Neg Hx     Past Medical History:  Diagnosis Date  . Anxiety   . Colon obstruction   . Colon polyps 05/2014   TUBULAR ADENOMA AND HYPERPLASTIC POLYP.  . Crohn's disease ([place])   . Gallbladder polyp   . Tubular adenoma of colon     Past Surgical History:  Procedure Laterality Date  . COLON RESECTION N/A 05/11/2015   Procedure: Laparotomy with resection of Chron's Disease small bowel resection biopsy mesenteric nodule;  Surgeon: Fanny Skates, MD;  Location: [facility] ORS;  Service: General;  Laterality: N/A;  . COLONOSCOPY W/ BIOPSIES  2015  . OTHER SURGICAL HISTORY     heel surgery  . Right Heel surgery     with plates and screws  . wisdom teeth extracted      Social History:  reports that she has never smoked. She has never used smokeless tobacco. She reports that she drinks alcohol. She reports that she does not use drugs.  Allergies:  Allergies  Allergen Reactions  . Morphine And Related Other (See Comments)    Headache    Facility-Administered Medications Prior to Admission  Medication Dose Route Frequency Provider Last Rate Last Dose  . [EXPIRED] 0.9 %  sodium chloride infusion   Intravenous Continuous Jerene Bears, MD      . vedolizumab (ENTYVIO) 300 mg in sodium chloride 0.9 % 250 mL infusion  300 mg Intravenous Once Jay M  Pyrtle, MD      . vedolizumab (ENTYVIO) 300 mg in sodium chloride 0.9 % 250 mL infusion  300 mg Intravenous Once Jerene Bears, MD      . vedolizumab (ENTYVIO) 300 mg in sodium chloride 0.9 % 250 mL infusion  300 mg Intravenous Once Jerene Bears, MD      . vedolizumab (ENTYVIO) 300 mg in sodium chloride 0.9 % 250 mL infusion  300 mg Intravenous Once Manus Gunning, MD       Medications Prior to Admission  Medication Sig Dispense Refill  . azaTHIOprine (IMURAN) 50 MG tablet Take 1 tablet (50 mg  total) by mouth daily. 30 tablet 0  . Hyoscyamine Sulfate SL (LEVSIN/SL) 0.125 MG SUBL Take 2 tablets by mouth every 4-6 hours as needed (Patient taking differently: Place 2 tablets under the tongue every 4 (four) hours as needed (cramping). ) 90 each 2  . Multiple Vitamins-Minerals (MULTIVITAMIN WITH MINERALS) tablet Take 1 tablet by mouth daily.    Marland Kitchen oxyCODONE-acetaminophen (PERCOCET/ROXICET) 5-325 MG tablet Take 1 tablet by mouth every 6 (six) hours as needed for severe pain. 20 tablet 0  . predniSONE (DELTASONE) 20 MG tablet Take 3 tablets (60 mg total) by mouth daily with breakfast. For the next four days 15 tablet 0  . temazepam (RESTORIL) 30 MG capsule Take 1 capsule (30 mg total) by mouth at bedtime as needed for sleep. 30 capsule 1  . potassium chloride SA (K-DUR,KLOR-CON) 20 MEQ tablet Take 2 tablets (40 mEq total) by mouth daily. 4 tablet 0    Blood pressure (!) 156/109, pulse (!) 115, temperature 97.5 F (36.4 C), temperature source Oral, resp. rate 20, height _0  (1.727 m), weight 143 lb 4.8 oz (65 kg), SpO2 96 %. Physical Exam: General: pleasant, WD/WN white female who is laying in bed in NAD HEENT: head is normocephalic, atraumatic.  Mouth is pink and moist Heart: regular, rate, and rhythm. Lungs: Respiratory effort nonlabored Abd: well healed midline abdominal incision, soft, ND, +BS, no masses, hernias, or organomegaly. Tender mostly in the RLQ MS: all 4 extremities are symmetrical with no cyanosis, clubbing, or edema. Skin: warm and dry with no masses, lesions, or rashes Psych: A&Ox3 with an appropriate affect.  Results for orders placed or performed during the hospital encounter of 09/27/16 (from the past 48 hour(s))  Comprehensive metabolic panel     Status: Abnormal   Collection Time: 09/27/16 10:57 AM  Result Value Ref Range   Sodium 141 135 - 145 mmol/L   Potassium 3.3 (L) 3.5 - 5.1 mmol/L   Chloride 100 (L) 101 - 111 mmol/L   CO2 28 22 - 32 mmol/L   Glucose, Bld  139 (H) 65 - 99 mg/dL   BUN 25 (H) 6 - 20 mg/dL   Creatinine, Ser 0.88 0.44 - 1.00 mg/dL   Calcium 10.1 8.9 - 10.3 mg/dL   Total Protein 7.7 6.5 - 8.1 g/dL   Albumin 4.4 3.5 - 5.0 g/dL   AST 22 15 - 41 U/L   ALT 19 14 - 54 U/L   Alkaline Phosphatase 44 38 - 126 U/L   Total Bilirubin 0.5 0.3 - 1.2 mg/dL   GFR calc non Af Amer >60 >60 mL/min   GFR calc Af Amer >60 >60 mL/min    Comment: (NOTE) The eGFR has been calculated using the CKD EPI equation. This calculation has not been validated in all clinical situations. eGFR's persistently <60 mL/min signify possible Chronic Kidney Disease.  Anion gap 13 5 - 15  CBC     Status: None   Collection Time: 09/27/16 10:57 AM  Result Value Ref Range   WBC 8.3 4.0 - 10.5 K/uL   RBC 4.15 3.87 - 5.11 MIL/uL   Hemoglobin 12.6 12.0 - 15.0 g/dL   HCT 38.3 36.0 - 46.0 %   MCV 92.3 78.0 - 100.0 fL   MCH 30.4 26.0 - 34.0 pg   MCHC 32.9 30.0 - 36.0 g/dL   RDW 14.2 11.5 - 15.5 %   Platelets 292 150 - 400 K/uL   Ct Abdomen Pelvis W Contrast  Result Date: 09/25/2016 CLINICAL DATA:  Right lower quadrant pain and vomiting EXAM: CT ABDOMEN AND PELVIS WITH CONTRAST TECHNIQUE: Multidetector CT imaging of the abdomen and pelvis was performed using the standard protocol following bolus administration of intravenous contrast. CONTRAST:  131m ISOVUE-300 IOPAMIDOL (ISOVUE-300) INJECTION 61%, 344mISOVUE-300 IOPAMIDOL (ISOVUE-300) INJECTION 61% COMPARISON:  MRI 03/14/2016, CT abdomen pelvis 05/03/2015 FINDINGS: Lower chest: Lung bases clear and without acute infiltrate or pleural effusion. There are partially visualized bilateral breast prostheses. Heart size is nonenlarged. No pericardial effusion at the lung bases. Hepatobiliary: No focal liver abnormality is seen. No gallstones, gallbladder wall thickening, or biliary dilatation. Pancreas: Unremarkable. No pancreatic ductal dilatation or surrounding inflammatory changes. Spleen: Normal in size without focal  abnormality. Adrenals/Urinary Tract: Diminutive left adrenal gland. Stable right adrenal gland nodule measuring 2.6 cm, presumed adenoma. Kidneys demonstrate normal enhancement and no hydronephrosis. The urinary bladder is unremarkable. Stomach/Bowel: The stomach is nondilated. There is a single loop of dilated small bowel within the anterior abdomen at the level of the umbilicus, this measures 4.5 cm in AP diameter, series 2, image number 49. Degree of bowel dilatation has decreased compared to previous MRI. There are postsurgical changes at the left aspect of the dilated loop of bowel. No upstream dilated segments of small bowel are seen. There is migration of contrast distal to the focally dilated loop of small bowel. No definite wall thickening. The appendix is visualized and is within normal limits. No significant right lower quadrant inflammatory changes are visualized. Vascular/Lymphatic: Aorta is non aneurysmal. No pathologically enlarged retroperitoneal or mesenteric lymph nodes. Reproductive: Visualized and grossly unremarkable. Other: No free air or free fluid. Musculoskeletal: No acute osseous abnormality. IMPRESSION: 1. Isolated loop of dilated small bowel in the anterior abdomen at the level of umbilicus, slightly increased in size compared to previous MRI. No definite wall thickening to suggest acute inflammation. No dilated small bowel is seen upstream to this, and there is migration of contrast past the dilated segment of small bowel, suggesting that there is no obstruction. 2. Normal appearance of the appendix. No significant right lower quadrant inflammatory process identified. 3. Stable right adrenal gland nodule, presumably adenoma. Electronically Signed   By: KiDonavan Foil.D.   On: 09/25/2016 17:58      Assessment/Plan Crohns ileitis Recurrent right sided abdominal pain with partial obstructive symptoms - H/o Exploratory laparotomy with segmental resection of 12 inches of mid ileum  with primary anastomosis by Dr. InDalbert Batman/2016 - recurrent right sided abdominal pain with intermittent nausea, vomiting, and diarrhea since February 2017. She has been on Entyvio in the past and current prednisone, but continues to suffer from these symptoms. Per GI she will eventually need to change therapy, preferably to Stelara. - general surgery consulted for consideration of a laparoscopic resection of her anastomosis  Plan - will discuss with MD.  BRJerrye BeaversPA-C  [facility] 09/27/2016, 11:37 AM Pager: [phone] Consults: [phone] Mon-Fri 7:00 am-4:30 pm Sat-Sun 7:00 am-11:30 am

## 2016-09-27 NOTE — Progress Notes (Signed)
Subjective:    Patient ID: Meghan Charles, female    DOB: 1971/07/25, 45 y.o.   MRN: [id number]  HPI Meghan Charles is a 45 yo With Crohn's ileitis seen in follow-up. She was last in the office on 05/19/2016. She has been struggling with recurrent right-sided abdominal pain and this had several ER visits. Unfortunately she has lost her insurance. Previously she was on Entyvio since December 2016 for her history of stricturing Crohn's disease (her one and only abdominal surgery for Crohn's was in May 2016).  She has not had Entyvio infusion since July and had to cancel her infusion in September due to loss of insurance.  She has been struggling with right-sided abdominal pain nausea and vomiting. This has been severely limiting to her activities of daily living. I started a prednisone taper after she had been on budesonide on 09/07/2016. We started at 40 mg with plans to taper by 5 mg every week. Her prednisone was increased to 60 mg 2 days ago by the emergency department where she presented with her current symptoms. CT scan was performed Which showed an isolated loop of dilated small bowel without definitive wall thickening. She reports that she is able to eat has been having bowel movements and passing flatus. Her bowel movements vary from formed to loose. She denies blood or melena in her stools. However she struggles with frequent nausea and vomiting, all are worse after eating. Her pain again is located on her right side and it can be cramping and at other times very sharp in nature. She denies fever or chills. Sleep has been affected and has been poor recently. She left her old job over the stress but also her current symptoms. 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  She asks for hospital admission because "I feel so bad".  Review of Systems As per HPI, otherwise negative  Current Medications,  Allergies, Past Medical History, Past Surgical History, Family History and Social History were reviewed in [employer] record.     Objective:   Physical Exam BP 132/86   Pulse (!) 101   Ht 5\' 6"  (1.676 m)   Wt 165 lb 8 oz (75.1 kg)   SpO2 97%   BMI 26.71 kg/m  Constitutional: Well-developed and well-nourished. No distress. HEENT: Normocephalic and atraumatic. Oropharynx is clear and moist. No oropharyngeal exudate. Conjunctivae are normal.  No scleral icterus. Neck: Neck supple. Trachea midline. Cardiovascular: Normal rate, regular rhythm and intact distal pulses. No M/R/G Pulmonary/chest: Effort normal and breath sounds normal. No wheezing, rales or rhonchi. Abdominal: Soft, tender in right abd with minimal palp, no rebound, mild distention, Bowel sounds present but hypoactive  Extremities: no clubbing, cyanosis, or edema Lymphadenopathy: No cervical adenopathy noted. Neurological: Alert and oriented to person place and time. Skin: Skin is warm and dry. No rashes noted. Psychiatric: Normal mood and affect. Behavior is normal.  CBC    Component Value Date/Time   WBC 15.9 (H) 09/25/2016 1352   RBC 4.61 09/25/2016 1352   HGB 14.1 09/25/2016 1352   HCT 42.2 09/25/2016 1352   PLT 356 09/25/2016 1352   MCV 91.5 09/25/2016 1352   MCH 30.6 09/25/2016 1352   MCHC 33.4 09/25/2016 1352   RDW 14.3 09/25/2016 1352   LYMPHSABS 1.7 07/26/2016 1300   MONOABS 0.4 07/26/2016 1300   EOSABS 0.1 07/26/2016 1300   BASOSABS 0.0  07/26/2016 1300   CMP     Component Value Date/Time   NA 137 09/25/2016 1352   K 3.7 09/25/2016 1352   CL 102 09/25/2016 1352   CO2 25 09/25/2016 1352   GLUCOSE 137 (H) 09/25/2016 1352   BUN 18 09/25/2016 1352   CREATININE 0.83 09/25/2016 1352   CALCIUM 9.9 09/25/2016 1352   PROT 8.0 09/25/2016 1352   ALBUMIN 4.6 09/25/2016 1352   AST 19 09/25/2016 1352   ALT 20 09/25/2016 1352   ALKPHOS 45 09/25/2016 1352   BILITOT 1.0 09/25/2016 1352    GFRNONAA >60 09/25/2016 1352   GFRAA >60 09/25/2016 1352   Lipase     Component Value Date/Time   LIPASE 31 09/25/2016 1352   CT ABDOMEN AND PELVIS WITH CONTRAST   TECHNIQUE: Multidetector CT imaging of the abdomen and pelvis was performed using the standard protocol following bolus administration of intravenous contrast.   CONTRAST:  121mL ISOVUE-300 IOPAMIDOL (ISOVUE-300) INJECTION 61%, 87mL ISOVUE-300 IOPAMIDOL (ISOVUE-300) INJECTION 61%   COMPARISON:  MRI 03/14/2016, CT abdomen pelvis 05/03/2015   FINDINGS: Lower chest: Lung bases clear and without acute infiltrate or pleural effusion. There are partially visualized bilateral breast prostheses. Heart size is nonenlarged. No pericardial effusion at the lung bases.   Hepatobiliary: No focal liver abnormality is seen. No gallstones, gallbladder wall thickening, or biliary dilatation.   Pancreas: Unremarkable. No pancreatic ductal dilatation or surrounding inflammatory changes.   Spleen: Normal in size without focal abnormality.   Adrenals/Urinary Tract: Diminutive left adrenal gland. Stable right adrenal gland nodule measuring 2.6 cm, presumed adenoma. Kidneys demonstrate normal enhancement and no hydronephrosis. The urinary bladder is unremarkable.   Stomach/Bowel: The stomach is nondilated. There is a single loop of dilated small bowel within the anterior abdomen at the level of the umbilicus, this measures 4.5 cm in AP diameter, series 2, image number 49. Degree of bowel dilatation has decreased compared to previous MRI. There are postsurgical changes at the left aspect of the dilated loop of bowel. No upstream dilated segments of small bowel are seen. There is migration of contrast distal to the focally dilated loop of small bowel. No definite wall thickening.   The appendix is visualized and is within normal limits. No significant right lower quadrant inflammatory changes are visualized.     Vascular/Lymphatic: Aorta is non aneurysmal. No pathologically enlarged retroperitoneal or mesenteric lymph nodes.   Reproductive: Visualized and grossly unremarkable.   Other: No free air or free fluid.   Musculoskeletal: No acute osseous abnormality.   IMPRESSION: 1. Isolated loop of dilated small bowel in the anterior abdomen at the level of umbilicus, slightly increased in size compared to previous MRI. No definite wall thickening to suggest acute inflammation. No dilated small bowel is seen upstream to this, and there is migration of contrast past the dilated segment of small bowel, suggesting that there is no obstruction. 2. Normal appearance of the appendix. No significant right lower quadrant inflammatory process identified. 3. Stable right adrenal gland nodule, presumably adenoma.     Electronically Signed   By: Donavan Foil M.D.   On: 09/25/2016 17:58      Assessment & Plan:  45 year old female with ileal Crohn's disease with recurrent right-sided abdominal pain and partial obstructive symptoms  1. Ileal Crohn's -- she is struggling and not making it home despite high-dose prednisone therapy. She's having ongoing abdominal pain, nausea and vomiting. I'm concerned for recurrent stricture at her small bowel anastomosis. It is unclear to me  whether she is having active Crohn's disease or if she has stricture which is fixed and causing recurrent symptoms. --I recommend hospital admission for IV steroids, IV fluids and pain control --I have also recommended surgical involvement for possible resection given her ongoing issues with recurrent symptoms which have not responded to medical therapy. She is open to the possibility of more surgery --Eventually we will need to change therapy, preferably to Stelara as Entyvio never really helped her small bowel Crohn's. I expect azathioprine will also need to be titrated as tolerated --I discussed this with the admitting hospitalist and  will discuss further with Dr. Carlean Purl as well as surgical colleagues  2. 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 Will not need further quantiferon gold testing per ID

## 2016-09-27 NOTE — H&P (Addendum)
TRH H&P   Patient Demographics:    Meghan Charles, is a 45 y.o. female  MRN: [id number]   DOB - [birth year]  Admit Date - 09/27/2016  Outpatient Primary MD for the patient is No PCP Per Patient  Referring MD/NP/PA: Direct admission from GI Dr. Hilarie Fredrickson  Outpatient Specialists: GI Dr. Hilarie Fredrickson  Patient coming from: GI office  No chief complaint on file.     HPI:    Meghan Charles  is a 45 y.o. female, With history of Crohn ileitis, hypertension, multiple admissions in the past because of Crohn's flare most recent in May 2017, patients presents to GI office with complaints of abdominal pain, nausea and vomiting for a few days, she was on prednisone taper over last 3 weeks with no significant improvement of symptoms, she had an ED visit 10/9, which was significant for isolated dilated loop of small bowel with no evidence of thickening, patient denies any fever or chills, reports rules for few days, but reports poor oral intake, nausea and vomiting most recent last Monday, was felt to have Crohn's flare by GI who requested direct admission.    Review of systems:    In addition to the HPI above, No Fever-chills, No Headache, No changes with Vision or hearing, No problems swallowing food or Liquids, No Chest pain, Cough or Shortness of Breath, Complains of abdominal pain, nausea and vomiting and loose bowel movements No Blood in stool or Urine, No dysuria, No new skin rashes or bruises, No new joints pains-aches,  No new weakness, tingling, numbness in any extremity, No recent weight gain or loss, No polyuria, polydypsia or polyphagia, No significant Mental Stressors.  A full 10 point Review of Systems was done, except as stated above, all other Review of Systems were negative.   With Past History of the following :    Past Medical History:  Diagnosis Date  . Anxiety   .  Colon obstruction   . Colon polyps 05/2014   TUBULAR ADENOMA AND HYPERPLASTIC POLYP.  . Crohn's disease ([place])   . Gallbladder polyp   . Tubular adenoma of colon       Past Surgical History:  Procedure Laterality Date  . COLON RESECTION N/A 05/11/2015   Procedure: Laparotomy with resection of Chron's Disease small bowel resection biopsy mesenteric nodule;  Surgeon: Fanny Skates, MD;  Location: [facility];  Service: General;  Laterality: N/A;  . COLONOSCOPY W/ BIOPSIES  2015  . OTHER SURGICAL HISTORY     heel surgery  . Right Heel surgery     with plates and screws  . wisdom teeth extracted        Social History:     Social History  Substance Use Topics  . Smoking status: Never Smoker  . Smokeless tobacco: Never Used  . Alcohol use 0.0 oz/week     Comment: 3 times per week  Lives - At home  Mobility - Independent     Family History :     Family History  Problem Relation Age of Onset  . Colon cancer Maternal Grandmother 28  . Esophageal cancer Neg Hx   . Stomach cancer Neg Hx   . Rectal cancer Neg Hx   . Pancreatic cancer Neg Hx      Home Medications:   Prior to Admission medications   Medication Sig Start Date End Date Taking? Authorizing Provider  azaTHIOprine (IMURAN) 50 MG tablet Take 1 tablet (50 mg total) by mouth daily. 09/25/16  Yes Carmin Muskrat, MD  Hyoscyamine Sulfate SL (LEVSIN/SL) 0.125 MG SUBL Take 2 tablets by mouth every 4-6 hours as needed Patient taking differently: Place 2 tablets under the tongue every 4 (four) hours as needed (cramping).  09/26/16  Yes Jerene Bears, MD  Multiple Vitamins-Minerals (MULTIVITAMIN WITH MINERALS) tablet Take 1 tablet by mouth daily.   Yes Historical Provider, MD  oxyCODONE-acetaminophen (PERCOCET/ROXICET) 5-325 MG tablet Take 1 tablet by mouth every 6 (six) hours as needed for severe pain. 09/25/16  Yes Carmin Muskrat, MD  predniSONE (DELTASONE) 20 MG tablet Take 3 tablets (60 mg total) by mouth daily with  breakfast. For the next four days 09/25/16 09/30/16 Yes Carmin Muskrat, MD  temazepam (RESTORIL) 30 MG capsule Take 1 capsule (30 mg total) by mouth at bedtime as needed for sleep. 08/22/16  Yes Jerene Bears, MD  potassium chloride SA (K-DUR,KLOR-CON) 20 MEQ tablet Take 2 tablets (40 mEq total) by mouth daily. 09/08/16 09/10/16  Jerene Bears, MD     Allergies:     Allergies  Allergen Reactions  . Morphine And Related Other (See Comments)    Headache     Physical Exam:   Vitals  Blood pressure (!) 156/109, pulse (!) 115, temperature 97.5 F (36.4 C), temperature source Oral, resp. rate 20, height 5' 8"  (1.727 m), weight 65 kg (143 lb 4.8 oz), SpO2 96 %.   1. General Well-developed female lying in bed in NAD,   2. Normal affect and insight, Not Suicidal or Homicidal, Awake Alert, Oriented X 3.  3. No F.N deficits, ALL C.Nerves Intact, Strength 5/5 all 4 extremities, Sensation intact all 4 extremities, Plantars down going.  4. Ears and Eyes appear Normal, Conjunctivae clear, PERRLA. Moist Oral Mucosa.  5. Supple Neck, No JVD, No cervical lymphadenopathy appriciated, No Carotid Bruits.  6. Symmetrical Chest wall movement, Good air movement bilaterally, CTAB.  7. RRR, No Gallops, Rubs or Murmurs, No Parasternal Heave.  8. Positive Bowel Sounds, Abdomen Soft, mild tenderness in right abdomen, No organomegaly appriciated,No rebound -guarding or rigidity.  9.  No Cyanosis, Normal Skin Turgor, No Skin Rash or Bruise.  10. Good muscle tone,  joints appear normal , no effusions, Normal ROM.  11. No Palpable Lymph Nodes in Neck or Axillae    Data Review:    CBC  Recent Labs Lab 09/25/16 1352  WBC 15.9*  HGB 14.1  HCT 42.2  PLT 356  MCV 91.5  MCH 30.6  MCHC 33.4  RDW 14.3   ------------------------------------------------------------------------------------------------------------------  Chemistries   Recent Labs Lab 09/25/16 1352  NA 137  K 3.7  CL 102  CO2 25    GLUCOSE 137*  BUN 18  CREATININE 0.83  CALCIUM 9.9  AST 19  ALT 20  ALKPHOS 45  BILITOT 1.0   ------------------------------------------------------------------------------------------------------------------ estimated creatinine clearance is 86.3 mL/min (by C-G formula based on SCr of 0.83 mg/dL). ------------------------------------------------------------------------------------------------------------------  No results for input(s): TSH, T4TOTAL, T3FREE, THYROIDAB in the last 72 hours.  Invalid input(s): FREET3  Coagulation profile No results for input(s): INR, PROTIME in the last 168 hours. ------------------------------------------------------------------------------------------------------------------- No results for input(s): DDIMER in the last 72 hours. -------------------------------------------------------------------------------------------------------------------  Cardiac Enzymes No results for input(s): CKMB, TROPONINI, MYOGLOBIN in the last 168 hours.  Invalid input(s): CK ------------------------------------------------------------------------------------------------------------------ No results found for: BNP   ---------------------------------------------------------------------------------------------------------------  Urinalysis    Component Value Date/Time   COLORURINE YELLOW 09/25/2016 [place] 09/25/2016 1655   LABSPEC 1.016 09/25/2016 1655   PHURINE 7.5 09/25/2016 1655   GLUCOSEU NEGATIVE 09/25/2016 1655   HGBUR NEGATIVE 09/25/2016 1655   BILIRUBINUR NEGATIVE 09/25/2016 1655   KETONESUR NEGATIVE 09/25/2016 1655   PROTEINUR NEGATIVE 09/25/2016 1655   UROBILINOGEN 0.2 10/11/2015 0924   NITRITE NEGATIVE 09/25/2016 1655   LEUKOCYTESUR NEGATIVE 09/25/2016 1655    ----------------------------------------------------------------------------------------------------------------   Imaging Results:    Ct Abdomen Pelvis W  Contrast  Result Date: 09/25/2016 CLINICAL DATA:  Right lower quadrant pain and vomiting EXAM: CT ABDOMEN AND PELVIS WITH CONTRAST TECHNIQUE: Multidetector CT imaging of the abdomen and pelvis was performed using the standard protocol following bolus administration of intravenous contrast. CONTRAST:  193m ISOVUE-300 IOPAMIDOL (ISOVUE-300) INJECTION 61%, 378mISOVUE-300 IOPAMIDOL (ISOVUE-300) INJECTION 61% COMPARISON:  MRI 03/14/2016, CT abdomen pelvis 05/03/2015 FINDINGS: Lower chest: Lung bases clear and without acute infiltrate or pleural effusion. There are partially visualized bilateral breast prostheses. Heart size is nonenlarged. No pericardial effusion at the lung bases. Hepatobiliary: No focal liver abnormality is seen. No gallstones, gallbladder wall thickening, or biliary dilatation. Pancreas: Unremarkable. No pancreatic ductal dilatation or surrounding inflammatory changes. Spleen: Normal in size without focal abnormality. Adrenals/Urinary Tract: Diminutive left adrenal gland. Stable right adrenal gland nodule measuring 2.6 cm, presumed adenoma. Kidneys demonstrate normal enhancement and no hydronephrosis. The urinary bladder is unremarkable. Stomach/Bowel: The stomach is nondilated. There is a single loop of dilated small bowel within the anterior abdomen at the level of the umbilicus, this measures 4.5 cm in AP diameter, series 2, image number 49. Degree of bowel dilatation has decreased compared to previous MRI. There are postsurgical changes at the left aspect of the dilated loop of bowel. No upstream dilated segments of small bowel are seen. There is migration of contrast distal to the focally dilated loop of small bowel. No definite wall thickening. The appendix is visualized and is within normal limits. No significant right lower quadrant inflammatory changes are visualized. Vascular/Lymphatic: Aorta is non aneurysmal. No pathologically enlarged retroperitoneal or mesenteric lymph nodes.  Reproductive: Visualized and grossly unremarkable. Other: No free air or free fluid. Musculoskeletal: No acute osseous abnormality. IMPRESSION: 1. Isolated loop of dilated small bowel in the anterior abdomen at the level of umbilicus, slightly increased in size compared to previous MRI. No definite wall thickening to suggest acute inflammation. No dilated small bowel is seen upstream to this, and there is migration of contrast past the dilated segment of small bowel, suggesting that there is no obstruction. 2. Normal appearance of the appendix. No significant right lower quadrant inflammatory process identified. 3. Stable right adrenal gland nodule, presumably adenoma. Electronically Signed   By: KiDonavan Foil.D.   On: 09/25/2016 17:58      Assessment & Plan:    Active Problems:   Abdominal pain   Nausea and vomiting in adult   Gastroesophageal reflux disease without esophagitis   Essential hypertension   [place]  Ileal  Crohn's flare - Causing abdominal pain, nausea and vomiting - Discussed with GI, patient will be directly admitted, there is a concern for recurrent stricture at her small bowel anastomosis, versus active Crohn's disease, so patient will be kept on clear liquid diet, on IV fluid, when necessary pain and nausea medication, will start on IV Solu-Medrol 40 mg IV daily. -  given recent imaging none to repeat CT abdomen and pelvis - GI will discuss with general surgery for possible need of resection covering her ongoing issue with recurrent symptoms. - We'll obtain CBC, CMP and CRP - Patient on Imuran at home, will hold until she is seen by GI  Hypertension - Continue home medication, will start on when necessary hydralazine  GERD - Continue with PPI  History of latent TB  - she completed therapy. Will not need further quantiferon gold testing per GI.  DVT Prophylaxis Heparin SCDs  AM Labs Ordered, also please review Full Orders  Family  Communication: Admission, patients condition and plan of care including tests being ordered have been discussed with the patientwho indicate understanding and agree with the plan and Code Status.  Code Status Full  Likely DC to home  Condition GUARDED    Consults called: GI will D/W Surgery  Admission status: Observation  Time spent in minutes : 36 Minutes   Khalifa Knecht M.D on 09/27/2016 at 11:12 AM  Between 7am to 7pm - Pager - [phone]. After 7pm go to [email or web address] - password [facility]  [facility] - Office  [phone]

## 2016-09-27 NOTE — Patient Instructions (Signed)
You are being admitted to [facility].   If you are age 45 or older, your body mass index should be between 23-30. Your Body mass index is 26.71 kg/m. If this is out of the aforementioned range listed, please consider follow up with your Primary Care Provider.  If you are age 28 or younger, your body mass index should be between 19-25. Your Body mass index is 26.71 kg/m. If this is out of the aformentioned range listed, please consider follow up with your Primary Care Provider.

## 2016-09-27 NOTE — Consult Note (Signed)
Patient seen in clinic today, OV note copied below and will serve as Consult note:  Subjective:    Patient ID: Meghan Charles, female    DOB: [birth year], 45 y.o.   MRN: [id number]  HPI Meghan Charles is a 45 yo With Crohn's ileitis seen in follow-up. She was last in the office on 05/19/2016. She has been struggling with recurrent right-sided abdominal pain and this had several ER visits. Unfortunately she has lost her insurance. Previously she was on Entyvio since December 2016 for her history of stricturing Crohn's disease (her one and only abdominal surgery for Crohn's was in May 2016).  She has not had Entyvio infusion since July and had to cancel her infusion in September due to loss of insurance.  She has been struggling with right-sided abdominal pain nausea and vomiting. This has been severely limiting to her activities of daily living. I started a prednisone taper after she had been on budesonide on 09/07/2016. We started at 40 mg with plans to taper by 5 mg every week. Her prednisone was increased to 60 mg 2 days ago by the emergency department where she presented with her current symptoms. CT scan was performed Which showed an isolated loop of dilated small bowel without definitive wall thickening. She reports that she is able to eat has been having bowel movements and passing flatus. Her bowel movements vary from formed to loose. She denies blood or melena in her stools. However she struggles with frequent nausea and vomiting, all are worse after eating. Her pain again is located on her right side and it can be cramping and at other times very sharp in nature. She denies fever or chills. Sleep has been affected and has been poor recently. She left her old job over the stress but also her current symptoms. 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  She asks for hospital admission because  "I feel so bad".  Review of Systems As per HPI, otherwise negative  Current Medications, Allergies, Past Medical History, Past Surgical History, Family History and Social History were reviewed in [employer] record.     Objective:   Physical Exam BP 132/86   Pulse (!) 101   Ht 5\' 6"  (1.676 m)   Wt 165 lb 8 oz (75.1 kg)   SpO2 97%   BMI 26.71 kg/m  Constitutional: Well-developed and well-nourished. No distress. HEENT: Normocephalic and atraumatic. Oropharynx is clear and moist. No oropharyngeal exudate. Conjunctivae are normal.  No scleral icterus. Neck: Neck supple. Trachea midline. Cardiovascular: Normal rate, regular rhythm and intact distal pulses. No M/R/G Pulmonary/chest: Effort normal and breath sounds normal. No wheezing, rales or rhonchi. Abdominal: Soft, tender in right abd with minimal palp, no rebound, mild distention, Bowel sounds present but hypoactive  Extremities: no clubbing, cyanosis, or edema Lymphadenopathy: No cervical adenopathy noted. Neurological: Alert and oriented to person place and time. Skin: Skin is warm and dry. No rashes noted. Psychiatric: Normal mood and affect. Behavior is normal.  CBC Labs (Brief)          Component Value Date/Time   WBC 15.9 (H) 09/25/2016 1352   RBC 4.61 09/25/2016 1352   HGB 14.1 09/25/2016 1352   HCT 42.2 09/25/2016 1352   PLT 356 09/25/2016 1352   MCV 91.5 09/25/2016 1352   MCH 30.6 09/25/2016 1352   MCHC 33.4 09/25/2016 1352   RDW  14.3 09/25/2016 1352   LYMPHSABS 1.7 07/26/2016 1300   MONOABS 0.4 07/26/2016 1300   EOSABS 0.1 07/26/2016 1300   BASOSABS 0.0 07/26/2016 1300     CMP     Labs (Brief)          Component Value Date/Time   NA 137 09/25/2016 1352   K 3.7 09/25/2016 1352   CL 102 09/25/2016 1352   CO2 25 09/25/2016 1352   GLUCOSE 137 (H) 09/25/2016 1352   BUN 18 09/25/2016 1352   CREATININE 0.83 09/25/2016 1352   CALCIUM 9.9 09/25/2016 1352    PROT 8.0 09/25/2016 1352   ALBUMIN 4.6 09/25/2016 1352   AST 19 09/25/2016 1352   ALT 20 09/25/2016 1352   ALKPHOS 45 09/25/2016 1352   BILITOT 1.0 09/25/2016 1352   GFRNONAA >60 09/25/2016 1352   GFRAA >60 09/25/2016 1352     Lipase  Labs (Brief)          Component Value Date/Time   LIPASE 31 09/25/2016 1352     CT ABDOMEN AND PELVIS WITH CONTRAST  TECHNIQUE: Multidetector CT imaging of the abdomen and pelvis was performed using the standard protocol following bolus administration of intravenous contrast.  CONTRAST: 122mL ISOVUE-300 IOPAMIDOL (ISOVUE-300) INJECTION 61%, 61mL ISOVUE-300 IOPAMIDOL (ISOVUE-300) INJECTION 61%  COMPARISON: MRI 03/14/2016, CT abdomen pelvis 05/03/2015  FINDINGS: Lower chest: Lung bases clear and without acute infiltrate or pleural effusion. There are partially visualized bilateral breast prostheses. Heart size is nonenlarged. No pericardial effusion at the lung bases.  Hepatobiliary: No focal liver abnormality is seen. No gallstones, gallbladder wall thickening, or biliary dilatation.  Pancreas: Unremarkable. No pancreatic ductal dilatation or surrounding inflammatory changes.  Spleen: Normal in size without focal abnormality.  Adrenals/Urinary Tract: Diminutive left adrenal gland. Stable right adrenal gland nodule measuring 2.6 cm, presumed adenoma. Kidneys demonstrate normal enhancement and no hydronephrosis. The urinary bladder is unremarkable.  Stomach/Bowel: The stomach is nondilated. There is a single loop of dilated small bowel within the anterior abdomen at the level of the umbilicus, this measures 4.5 cm in AP diameter, series 2, image number 49. Degree of bowel dilatation has decreased compared to previous MRI. There are postsurgical changes at the left aspect of the dilated loop of bowel. No upstream dilated segments of small bowel are seen. There is migration of contrast distal to the focally dilated  loop of small bowel. No definite wall thickening.  The appendix is visualized and is within normal limits. No significant right lower quadrant inflammatory changes are visualized.  Vascular/Lymphatic: Aorta is non aneurysmal. No pathologically enlarged retroperitoneal or mesenteric lymph nodes.  Reproductive: Visualized and grossly unremarkable.  Other: No free air or free fluid.  Musculoskeletal: No acute osseous abnormality.  IMPRESSION: 1. Isolated loop of dilated small bowel in the anterior abdomen at the level of umbilicus, slightly increased in size compared to previous MRI. No definite wall thickening to suggest acute inflammation. No dilated small bowel is seen upstream to this, and there is migration of contrast past the dilated segment of small bowel, suggesting that there is no obstruction. 2. Normal appearance of the appendix. No significant right lower quadrant inflammatory process identified. 3. Stable right adrenal gland nodule, presumably adenoma.   Electronically Signed By: Donavan Foil M.D. On: 09/25/2016 17:58     Assessment & Plan:  45 year old female with ileal Crohn's disease with recurrent right-sided abdominal pain and partial obstructive symptoms  1. Ileal Crohn's -- she is struggling and not making it home despite high-dose prednisone  therapy. She's having ongoing abdominal pain, nausea and vomiting. I'm concerned for recurrent stricture at her small bowel anastomosis. It is unclear to me whether she is having active Crohn's disease or if she has stricture which is fixed and causing recurrent symptoms. --I recommend hospital admission for IV steroids, IV fluids and pain control --I have also recommended surgical involvement for possible resection given her ongoing issues with recurrent symptoms which have not responded to medical therapy. She is open to the possibility of more surgery --Eventually we will need to change therapy,  preferably to Stelara as Entyvio never really helped her small bowel Crohn's. I expect azathioprine will also need to be titrated as tolerated --I discussed this with the admitting hospitalist and will discuss further with Dr. Carlean Purl as well as surgical colleagues  2. 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 Will not need further quantiferon gold testing per ID  ___________________________________________________________________________________________________  Also, discussed with Dr. Hilarie Fredrickson that we can hold Azathioprine for now as she has only been on low-dose since June and is experiencing nausea and vomiting. Per Dr. Hilarie Fredrickson, he is awaiting surgery consultation in regards to possible laparoscopic SB resection at area of stenosis. Also, further hx includes trial of Remicade in Sept 2015-pt developed Empyema, thought to be complication from this. Patient also has latent Tb which has been treated and per pulm does not need to undergo any further Quantiferon gold testing as this will continue to remain positive, though no risk in the future.  Discussed with Dr. Carlean Purl as well. We will see patient tomorrow morning, no change in orders overnight. Ellouise Newer, PA-C

## 2016-09-28 DIAGNOSIS — R112 Nausea with vomiting, unspecified: Secondary | ICD-10-CM

## 2016-09-28 DIAGNOSIS — K50912 Crohn's disease, unspecified, with intestinal obstruction: Secondary | ICD-10-CM

## 2016-09-28 LAB — BASIC METABOLIC PANEL
ANION GAP: 9 (ref 5–15)
BUN: 14 mg/dL (ref 6–20)
CHLORIDE: 104 mmol/L (ref 101–111)
CO2: 25 mmol/L (ref 22–32)
Calcium: 9.2 mg/dL (ref 8.9–10.3)
Creatinine, Ser: 0.7 mg/dL (ref 0.44–1.00)
GFR calc Af Amer: >60 mL/min (ref >60)
Glucose, Bld: 91 mg/dL (ref 65–99)
POTASSIUM: 3.5 mmol/L (ref 3.5–5.1)
SODIUM: 138 mmol/L (ref 135–145)

## 2016-09-28 LAB — CBC
HEMATOCRIT: 36.2 % (ref 36.0–46.0)
HEMOGLOBIN: 11.9 g/dL — AB (ref 12.0–15.0)
MCH: 30.4 pg (ref 26.0–34.0)
MCHC: 32.9 g/dL (ref 30.0–36.0)
MCV: 92.6 fL (ref 78.0–100.0)
Platelets: 288 10*3/uL (ref 150–400)
RBC: 3.91 MIL/uL (ref 3.87–5.11)
RDW: 14.5 % (ref 11.5–15.5)
WBC: 8.2 10*3/uL (ref 4.0–10.5)

## 2016-09-28 MED ORDER — ENOXAPARIN SODIUM 40 MG/0.4ML ~~LOC~~ SOLN
40.0000 mg | SUBCUTANEOUS | Status: DC
  Administered 2016-09-29: 40 mg via SUBCUTANEOUS
  Filled 2016-09-28 (×3): qty 0.4

## 2016-09-28 NOTE — Progress Notes (Signed)
PROGRESS NOTE    Meghan Charles  GYK:[id number] DOB: Jul 13, 1971 DOA: 09/27/2016 PCP: No PCP Per Patient    Brief Narrative: 45 y.o. female with history of Crohn ileitis, hypertension, multiple admissions in the past because of Crohn's flare most recent in May 2017, sent from GI clinic for crohn flare.   Assessment & Plan:   # Ileal Crohn's flare: Patient presented with nausea vomiting and abdominal pain. GI and surgery consult appreciated. -No surgical intervention as per general surgery team -On IV Solu-Medrol as per gastroenterologist -Recent CT scan of abdomen and pelvis reviewed -Continue IV fluid, Zofran as needed and current pain medications. -Further plan as per gastroenterologist, possible colonoscopy.  #History of latent TB status post treatment as per medical record.  #Hypokalemia on admission: Potassium improved. Monitor labs.   DVT prophylaxis: Concerned about frequent dosing of heparin. Started Lovenox. Encourage early ambulation and SCD. Code Status: Full code Family Communication: No family present at bedside Disposition Plan: Likely discharge home in 1-2 days   Consultants:   Gastroenterologist  General surgery  Procedures: None Antimicrobials: None  Subjective: The patient was seen and examined at bedside. Patient reported feeling better today. Still has nausea and abdominal discomfort but denies vomiting. Reported no fever, chills, chest pain or shortness of breath.  Objective: Vitals:   09/27/16 1018 09/27/16 1500 09/27/16 2101 09/28/16 0600  BP: (!) 156/109 (!) 143/83 (!) 157/110 (!) 143/96  Pulse: (!) 115 91 90 91  Resp: 20 18 18 19   Temp: 97.5 F (36.4 C) 98.5 F (36.9 C) 98.4 F (36.9 C) 98.3 F (36.8 C)  TempSrc: Oral Oral Oral Oral  SpO2: 96% 96% 94% 97%  Weight: 65 kg (143 lb 4.8 oz)     Height: 5' 8"  (1.727 m)       Intake/Output Summary (Last 24 hours) at 09/28/16 1315 Last data filed at 09/28/16 0601  Gross per 24 hour    Intake             1870 ml  Output                0 ml  Net             1870 ml   Filed Weights   09/27/16 1018  Weight: 65 kg (143 lb 4.8 oz)    Examination:  General exam: Appears calm and comfortable  Respiratory system: Clear to auscultation. Respiratory effort normal. Cardiovascular system: S1 & S2 heard, RRR. No pedal edema. Gastrointestinal system: Abdomen is nondistended, soft.Diffuse tenderness. No guarding. Normal bowel sounds heard. Central nervous system: Alert and oriented. No focal neurological deficits. Extremities: Symmetric 5 x 5 power. Skin: No rashes, lesions or ulcers Psychiatry: Judgement and insight appear normal. Mood & affect appropriate.     Data Reviewed: I have personally reviewed following labs and imaging studies  CBC:  Recent Labs Lab 09/25/16 1352 09/27/16 1057 09/28/16 0539  WBC 15.9* 8.3 8.2  HGB 14.1 12.6 11.9*  HCT 42.2 38.3 36.2  MCV 91.5 92.3 92.6  PLT 356 292 793   Basic Metabolic Panel:  Recent Labs Lab 09/25/16 1352 09/27/16 1057 09/28/16 0539  NA 137 141 138  K 3.7 3.3* 3.5  CL 102 100* 104  CO2 25 28 25   GLUCOSE 137* 139* 91  BUN 18 25* 14  CREATININE 0.83 0.88 0.70  CALCIUM 9.9 10.1 9.2   GFR: Estimated Creatinine Clearance: 89.6 mL/min (by C-G formula based on SCr of 0.7 mg/dL). Liver Function Tests:  Recent Labs Lab 09/25/16 1352 09/27/16 1057  AST 19 22  ALT 20 19  ALKPHOS 45 44  BILITOT 1.0 0.5  PROT 8.0 7.7  ALBUMIN 4.6 4.4    Recent Labs Lab 09/25/16 1352  LIPASE 31   No results for input(s): AMMONIA in the last 168 hours. Coagulation Profile: No results for input(s): INR, PROTIME in the last 168 hours. Cardiac Enzymes: No results for input(s): CKTOTAL, CKMB, CKMBINDEX, TROPONINI in the last 168 hours. BNP (last 3 results) No results for input(s): PROBNP in the last 8760 hours. HbA1C: No results for input(s): HGBA1C in the last 72 hours. CBG: No results for input(s): GLUCAP in the  last 168 hours. Lipid Profile: No results for input(s): CHOL, HDL, LDLCALC, TRIG, CHOLHDL, LDLDIRECT in the last 72 hours. Thyroid Function Tests: No results for input(s): TSH, T4TOTAL, FREET4, T3FREE, THYROIDAB in the last 72 hours. Anemia Panel: No results for input(s): VITAMINB12, FOLATE, FERRITIN, TIBC, IRON, RETICCTPCT in the last 72 hours. Sepsis Labs: No results for input(s): PROCALCITON, LATICACIDVEN in the last 168 hours.  No results found for this or any previous visit (from the past 240 hour(s)).       Radiology Studies: No results found.      Scheduled Meds: . heparin  5,000 Units Subcutaneous Q8H  . methylPREDNISolone (SOLU-MEDROL) injection  40 mg Intravenous Daily  . multivitamin with minerals  1 tablet Oral Daily  . pantoprazole  40 mg Oral Daily   Continuous Infusions: . sodium chloride 75 mL/hr at 09/27/16 1129     LOS: 1 day    Time spent: 24 minutes    Gurpreet Mikhail Tanna Furry, MD [facility] Pager [phone]  If 7PM-7AM, please contact night-coverage [email or web address] Password [email or web address] 09/28/2016, 1:15 PM

## 2016-09-28 NOTE — Progress Notes (Signed)
S: no change  Vitals, labs, intake/output, and orders reviewed at this time.  Gen: A&Ox3, no distress  H&N: EOMI, atraumatic, neck supple Chest: unlabored respirations, RRR Abd: soft, mildly tender RLQ, nondistended Ext: warm, no edema Neuro: grossly normal  Lines/tubes/drains: PIV  A/P:  Chronic RLQ pain in setting of crohn's. Will continue to follow.    Romana Juniper, MD [facility] Surgery, [place] Pager [phone]

## 2016-09-28 NOTE — Progress Notes (Signed)
Progress Note     [facility] Attending   I have taken an interval history, reviewed the chart and examined the patient. I agree with the Advanced Practitioner's note, impression and recommendations.   Complicated situation with small bowel stricture and focal dilation - she has wither failed or had complications from aggressive biologic medical Tx.  Discussed w/ primary GI and patient - we think segmental resection is reasonable in her case. Can understand caution in approaching this from surgeon's point of view but seems like we are out of good medical options. Stelara not proven to be effective for strictures and she has not responded to [facility]. Do not think colonoscopy would help - could do - and would not do a capsule endoscopy as anticipate capsule causing a partial obstruction becoming a total obstruction.  Dr. Hilarie Fredrickson plans to discuss with Dr. Windle Guard tomorrow.  Gatha Mayer, MD, [facility] [phone] (pager) [phone] after 5 PM, weekends and holidays  09/28/2016 7:03 PM       Assessment / Plan:   Assessment: 1. Ileal Crohn's: See Dr. Vena Rua note for further details, has failed multiple therapies in the past, recently was on Entyvio which did not seem to be helping, question of small bowel stricture, surgery recommends possible colonoscopy for further evaluation of active disease and does not recommend surgical intervention at this time 2. History of latent TB: Patient has completed therapy in the past  Plan: 1. Continue to appreciate surgery's recommendations, per review of the notes there is no plan for surgical intervention at this time 2. Will continue patient on IV steroids 40 mg per day, fluids and pain control 3. Plans to transition patient to Stelara in the near future 4. Will discuss above with Dr. Carlean Purl, please await any further recommendations  Thank you for your kind consultation, we will continue to follow   LOS: 1 day    Levin Erp  09/28/2016, 10:50 AM  Pager # [phone]   Subjective  Chief Complaint: Crohn's ileitis  Today, the patient tells me that she feels similar to yesterday, she has developed some bruising in 2 areas on her abdomen after heparin injections in these places. She has questions regarding this. She has not had a bowel movement since yesterday, but has not eaten much. She relays that she has spoken to surgery who does not believe that surgery is necessary.    Objective   Vital signs in last 24 hours: Temp:  [98.3 F (36.8 C)-98.5 F (36.9 C)] 98.3 F (36.8 C) (10/12 0600) Pulse Rate:  [90-91] 91 (10/12 0600) Resp:  [18-19] 19 (10/12 0600) BP: (143-157)/(83-110) 143/96 (10/12 0600) SpO2:  [94 %-97 %] 97 % (10/12 0600) Last BM Date: 09/26/16 General:  Caucasian female in NAD Heart:  Regular rate and rhythm; no murmurs Lungs: Respirations even and unlabored, lungs CTA bilaterally Abdomen:  Soft, TTP to mild palpation on right side, mild distention, hyperactive bowel sounds Extremities:  Without edema. Neurologic:  Alert and oriented,  grossly normal neurologically. Psych:  Cooperative. Normal mood and affect.  Intake/Output from previous day: 10/11 0701 - 10/12 0700 In: 1870 [P.O.:480; I.V.:1390] Out: -   Lab Results:  Recent Labs  09/25/16 1352 09/27/16 1057 09/28/16 0539  WBC 15.9* 8.3 8.2  HGB 14.1 12.6 11.9*  HCT 42.2 38.3 36.2  PLT 356 292 288   BMET  Recent Labs  09/25/16 1352 09/27/16 1057 09/28/16 0539  NA 137 141 138  K 3.7 3.3* 3.5  CL 102 100* 104  CO2 25 28 25   GLUCOSE 137* 139* 91  BUN 18 25* 14  CREATININE 0.83 0.88 0.70  CALCIUM 9.9 10.1 9.2    Studies/Results: CT ABDOMEN AND PELVIS WITH CONTRAST 09/25/16  TECHNIQUE: Multidetector CT imaging of the abdomen and pelvis was performed using the standard protocol following bolus administration of intravenous contrast.  CONTRAST: 11mL ISOVUE-300 IOPAMIDOL  (ISOVUE-300) INJECTION 61%, 80mL ISOVUE-300 IOPAMIDOL (ISOVUE-300) INJECTION 61%  COMPARISON: MRI 03/14/2016, CT abdomen pelvis 05/03/2015  FINDINGS: Lower chest: Lung bases clear and without acute infiltrate or pleural effusion. There are partially visualized bilateral breast prostheses. Heart size is nonenlarged. No pericardial effusion at the lung bases.  Hepatobiliary: No focal liver abnormality is seen. No gallstones, gallbladder wall thickening, or biliary dilatation.  Pancreas: Unremarkable. No pancreatic ductal dilatation or surrounding inflammatory changes.  Spleen: Normal in size without focal abnormality.  Adrenals/Urinary Tract: Diminutive left adrenal gland. Stable right adrenal gland nodule measuring 2.6 cm, presumed adenoma. Kidneys demonstrate normal enhancement and no hydronephrosis. The urinary bladder is unremarkable.  Stomach/Bowel: The stomach is nondilated. There is a single loop of dilated small bowel within the anterior abdomen at the level of the umbilicus, this measures 4.5 cm in AP diameter, series 2, image number 49. Degree of bowel dilatation has decreased compared to previous MRI. There are postsurgical changes at the left aspect of the dilated loop of bowel. No upstream dilated segments of small bowel are seen. There is migration of contrast distal to the focally dilated loop of small bowel. No definite wall thickening.  The appendix is visualized and is within normal limits. No significant right lower quadrant inflammatory changes are visualized.  Vascular/Lymphatic: Aorta is non aneurysmal. No pathologically enlarged retroperitoneal or mesenteric lymph nodes.  Reproductive: Visualized and grossly unremarkable.  Other: No free air or free fluid.  Musculoskeletal: No acute osseous abnormality.  IMPRESSION: 1. Isolated loop of dilated small bowel in the anterior abdomen at the level of umbilicus, slightly increased in size  compared to previous MRI. No definite wall thickening to suggest acute inflammation. No dilated small bowel is seen upstream to this, and there is migration of contrast past the dilated segment of small bowel, suggesting that there is no obstruction. 2. Normal appearance of the appendix. No significant right lower quadrant inflammatory process identified. 3. Stable right adrenal gland nodule, presumably adenoma.   Electronically Signed By: Donavan Foil M.D. On: 09/25/2016 17:58

## 2016-09-29 ENCOUNTER — Encounter (HOSPITAL_COMMUNITY): Payer: Self-pay | Admitting: Surgery

## 2016-09-29 DIAGNOSIS — K50019 Crohn's disease of small intestine with unspecified complications: Secondary | ICD-10-CM

## 2016-09-29 DIAGNOSIS — E876 Hypokalemia: Secondary | ICD-10-CM

## 2016-09-29 LAB — BASIC METABOLIC PANEL
ANION GAP: 6 (ref 5–15)
BUN: 10 mg/dL (ref 6–20)
CALCIUM: 9.1 mg/dL (ref 8.9–10.3)
CO2: 25 mmol/L (ref 22–32)
Chloride: 107 mmol/L (ref 101–111)
Creatinine, Ser: 0.75 mg/dL (ref 0.44–1.00)
GLUCOSE: 87 mg/dL (ref 65–99)
POTASSIUM: 3.5 mmol/L (ref 3.5–5.1)
Sodium: 138 mmol/L (ref 135–145)

## 2016-09-29 MED ORDER — POTASSIUM CHLORIDE 20 MEQ/15ML (10%) PO SOLN
40.0000 meq | Freq: Every day | ORAL | Status: DC
  Administered 2016-09-29 – 2016-09-30 (×2): 40 meq via ORAL
  Filled 2016-09-29 (×3): qty 30

## 2016-09-29 MED ORDER — ACETAMINOPHEN 500 MG PO TABS
1000.0000 mg | ORAL_TABLET | Freq: Three times a day (TID) | ORAL | Status: DC
  Administered 2016-09-29 – 2016-09-30 (×2): 1000 mg via ORAL
  Filled 2016-09-29 (×2): qty 2

## 2016-09-29 NOTE — Progress Notes (Signed)
Patient ID: Meghan Charles, female   DOB: 09/18/1971, 45 y.o.   MRN: [id number]  [facility] Surgery Progress Note     Subjective: Persistent RLQ abdominal pain. Denies n/v. Has not had BM since admission.  Objective: Vital signs in last 24 hours: Temp:  [97.7 F (36.5 C)-98.8 F (37.1 C)] 98.8 F (37.1 C) (10/13 0100) Pulse Rate:  [83-105] 86 (10/13 0612) Resp:  [16] 16 (10/13 0100) BP: (135-166)/(92-121) 141/92 (10/13 0612) SpO2:  [97 %-99 %] 99 % (10/13 0100) Last BM Date: 09/26/16  Intake/Output from previous day: 10/12 0701 - 10/13 0700 In: 1125 [I.V.:1125] Out: -  Intake/Output this shift: No intake/output data recorded.  PE: Gen:  Alert, NAD, pleasant Abd: Soft, ND, +BS, mild TTP RLQ  Lab Results:   Recent Labs  09/27/16 1057 09/28/16 0539  WBC 8.3 8.2  HGB 12.6 11.9*  HCT 38.3 36.2  PLT 292 288   BMET  Recent Labs  09/28/16 0539 09/29/16 0613  NA 138 138  K 3.5 3.5  CL 104 107  CO2 25 25  GLUCOSE 91 87  BUN 14 10  CREATININE 0.70 0.75  CALCIUM 9.2 9.1   PT/INR No results for input(s): LABPROT, INR in the last 72 hours. CMP     Component Value Date/Time   NA 138 09/29/2016 0613   K 3.5 09/29/2016 0613   CL 107 09/29/2016 0613   CO2 25 09/29/2016 0613   GLUCOSE 87 09/29/2016 0613   BUN 10 09/29/2016 0613   CREATININE 0.75 09/29/2016 0613   CALCIUM 9.1 09/29/2016 0613   PROT 7.7 09/27/2016 1057   ALBUMIN 4.4 09/27/2016 1057   AST 22 09/27/2016 1057   ALT 19 09/27/2016 1057   ALKPHOS 44 09/27/2016 1057   BILITOT 0.5 09/27/2016 1057   GFRNONAA >60 09/29/2016 0613   GFRAA >60 09/29/2016 0613   Lipase     Component Value Date/Time   LIPASE 31 09/25/2016 1352       Studies/Results: No results found.  Anti-infectives: Anti-infectives    None       Assessment/Plan Crohns ileitis Chronic right sided abdominal pain with partial obstructive symptoms - H/o Exploratory laparotomy with segmental resection of 12 inches  of mid ileum with primary anastomosis by Dr. Dalbert Batman 04/2015 - followed by GI who thinks segmental resection is reasonable as they are out of good medical options. Stelara not proven to be effective for strictures and she has not responded to [facility].  Plan - per note from GI Dr. Hilarie Fredrickson plans to discuss this patient with general surgery MD (Dr. Johney Maine here today)   LOS: 2 days    Jerrye Beavers , [facility] Surgery 09/29/2016, 8:35 AM Pager: [phone] Consults: [phone] Mon-Fri 7:00 am-4:30 pm Sat-Sun 7:00 am-11:30 am

## 2016-09-29 NOTE — Progress Notes (Signed)
PROGRESS NOTE    Meghan Charles  IRC:[id number] DOB: [birth year] DOA: 09/27/2016 PCP: No PCP Per Patient    Brief Narrative: 45 y.o. female with history of Crohn ileitis, hypertension, multiple admissions in the past because of Crohn's flare most recent in May 2017, sent from GI clinic for crohn flare.   Assessment & Plan:   # Ileal Crohn's flare: Patient presented with nausea vomiting and abdominal pain. GI and surgery consult appreciated. -Surgery and GI consultants are discussing about possible surgery vs medical management. Awaiting consultant's plan.  -On IV Solu-Medrol as per gastroenterologist -Recent CT scan of abdomen and pelvis reviewed -Continue IV fluid, Zofran as needed and current pain medications.  #History of latent TB status post treatment as per medical record.  #Hypokalemia on admission: Potassium level 3.5. Monitor labs.   DVT prophylaxis: lovenox sq Code Status: Full code Family Communication: No family present at bedside Disposition Plan: Likely discharge home in 1-2 days, awaiting GI and surgery's plan.   Consultants:   Gastroenterologist  General surgery  Procedures: None Antimicrobials: None  Subjective: The patient was seen and examined at bedside. Patient reported feeling better today. Has minimal nausea and right-sided abdominal pain. Denies vomiting, chest pain, shortness of breath, headache. No diarrhea.  Objective: Vitals:   09/28/16 1413 09/29/16 0100 09/29/16 0150 09/29/16 0612  BP: (!) 135/96 (!) 166/121 (!) 143/100 (!) 141/92  Pulse: 83 (!) 105 99 86  Resp: 16 16    Temp: 97.7 F (36.5 C) 98.8 F (37.1 C)    TempSrc: Oral Oral    SpO2: 97% 99%    Weight:      Height:        Intake/Output Summary (Last 24 hours) at 09/29/16 1206 Last data filed at 09/29/16 0629  Gross per 24 hour  Intake             1125 ml  Output                0 ml  Net             1125 ml   Filed Weights   09/27/16 1018  Weight: 65 kg (143 lb 4.8  oz)    Examination:  General exam:Not in distress, lying comfortable.  Respiratory system: Clear to auscultation bilateral, no wheeze or crackle. Cardiovascular system: Regular rate rhythm, S1-S2 normal. No lower extremity edema. Gastrointestinal system: Abdomen soft, nondistended. Tenderness on the right side. Bowel sound positive. Central nervous system: Alert, awake and oriented. No focal neurological deficit. Extremities: Symmetric 5 x 5 power. Skin: No rashes or ulcers. Psychiatry: Judgement and insight appear normal. Mood & affect appropriate.     Data Reviewed: I have personally reviewed following labs and imaging studies  CBC:  Recent Labs Lab 09/25/16 1352 09/27/16 1057 09/28/16 0539  WBC 15.9* 8.3 8.2  HGB 14.1 12.6 11.9*  HCT 42.2 38.3 36.2  MCV 91.5 92.3 92.6  PLT 356 292 510   Basic Metabolic Panel:  Recent Labs Lab 09/25/16 1352 09/27/16 1057 09/28/16 0539 09/29/16 0613  NA 137 141 138 138  K 3.7 3.3* 3.5 3.5  CL 102 100* 104 107  CO2 25 28 25 25   GLUCOSE 137* 139* 91 87  BUN 18 25* 14 10  CREATININE 0.83 0.88 0.70 0.75  CALCIUM 9.9 10.1 9.2 9.1   GFR: Estimated Creatinine Clearance: 89.6 mL/min (by C-G formula based on SCr of 0.75 mg/dL). Liver Function Tests:  Recent Labs Lab 09/25/16 1352 09/27/16 1057  AST 19 22  ALT 20 19  ALKPHOS 45 44  BILITOT 1.0 0.5  PROT 8.0 7.7  ALBUMIN 4.6 4.4    Recent Labs Lab 09/25/16 1352  LIPASE 31   No results for input(s): AMMONIA in the last 168 hours. Coagulation Profile: No results for input(s): INR, PROTIME in the last 168 hours. Cardiac Enzymes: No results for input(s): CKTOTAL, CKMB, CKMBINDEX, TROPONINI in the last 168 hours. BNP (last 3 results) No results for input(s): PROBNP in the last 8760 hours. HbA1C: No results for input(s): HGBA1C in the last 72 hours. CBG: No results for input(s): GLUCAP in the last 168 hours. Lipid Profile: No results for input(s): CHOL, HDL, LDLCALC,  TRIG, CHOLHDL, LDLDIRECT in the last 72 hours. Thyroid Function Tests: No results for input(s): TSH, T4TOTAL, FREET4, T3FREE, THYROIDAB in the last 72 hours. Anemia Panel: No results for input(s): VITAMINB12, FOLATE, FERRITIN, TIBC, IRON, RETICCTPCT in the last 72 hours. Sepsis Labs: No results for input(s): PROCALCITON, LATICACIDVEN in the last 168 hours.  No results found for this or any previous visit (from the past 240 hour(s)).       Radiology Studies: No results found.      Scheduled Meds: . enoxaparin (LOVENOX) injection  40 mg Subcutaneous Q24H  . methylPREDNISolone (SOLU-MEDROL) injection  40 mg Intravenous Daily  . multivitamin with minerals  1 tablet Oral Daily  . pantoprazole  40 mg Oral Daily  . potassium chloride  40 mEq Oral Daily   Continuous Infusions: . sodium chloride 75 mL/hr at 09/28/16 1416     LOS: 2 days    Time spent: 22 minutes    Jayshaun Phillips Tanna Furry, MD [facility] Pager [phone]  If 7PM-7AM, please contact night-coverage [email or web address] Password [email or web address] 09/29/2016, 12:06 PM

## 2016-09-29 NOTE — Progress Notes (Signed)
    Progress Note    [facility] Attending   I have taken an interval history, reviewed the chart and examined the patient. I agree with the Advanced Practitioner's note, impression and recommendations.    Crohn's of ileum with stricture failed medical Tx Waiting for further surgical input Am available on cell to talk if needed [phone]  Gatha Mayer, MD, [facility] [phone] (pager) [phone] after 5 PM, weekends and holidays  09/29/2016 5:25 PM   Assessment / Plan:   Assessment: 1. Ileal Crohn's: Again, complicated history with small bowel stricture and focal dilation, patient has failed or had complications with aggressive biologic medical treatments in the past 2. History of latent TB: Patient has completed therapy in the past when he no further testing in the future  Plan: 1. Dr. Hilarie Fredrickson will be discussing this case with the surgical team today for further evaluation and possible segmental resection 2. Will discuss above with Dr. Carlean Purl, please await any further recommendations  Thank you for your kind consultation, we will continue to follow.   LOS: 2 days   Levin Erp  09/29/2016, 10:36 AM  Pager # [phone]     Subjective  Chief Complaint:Ileal Crohn's complicated by stricture  Patient reports continuing to feel abdominal tenderness on the right side, she denies any bowel movements since time of admission. She is aware that the doctors are discussing possible surgical intervention. She denies any new complaints or concerns.    Objective   Vital signs in last 24 hours: Temp:  [97.7 F (36.5 C)-98.8 F (37.1 C)] 98.8 F (37.1 C) (10/13 0100) Pulse Rate:  [83-105] 86 (10/13 0612) Resp:  [16] 16 (10/13 0100) BP: (135-166)/(92-121) 141/92 (10/13 0612) SpO2:  [97 %-99 %] 99 % (10/13 0100) Last BM Date: 09/26/16 General:  Caucasian female in NAD Heart:  Regular rate and rhythm; no murmurs Lungs: Respirations even  and unlabored, lungs CTA bilaterally Abdomen:  Soft, moderate right-sided abdominal tenderness and nondistended. Decreased bowel sounds Extremities:  Without edema. Neurologic:  Alert and oriented,  grossly normal neurologically. Psych:  Cooperative. Normal mood and affect.  Lab Results: BMET  Recent Labs  09/27/16 1057 09/28/16 0539 09/29/16 0613  NA 141 138 138  K 3.3* 3.5 3.5  CL 100* 104 107  CO2 28 25 25   GLUCOSE 139* 91 87  BUN 25* 14 10  CREATININE 0.88 0.70 0.75  CALCIUM 10.1 9.2 9.1

## 2016-09-30 ENCOUNTER — Encounter (HOSPITAL_COMMUNITY): Payer: Self-pay | Admitting: Internal Medicine

## 2016-09-30 DIAGNOSIS — K219 Gastro-esophageal reflux disease without esophagitis: Secondary | ICD-10-CM

## 2016-09-30 MED ORDER — OXYCODONE-ACETAMINOPHEN 5-325 MG PO TABS: 1.0000 | ORAL_TABLET | Freq: Three times a day (TID) | ORAL | 0 refills | Status: DC | PRN

## 2016-09-30 MED ORDER — ONDANSETRON HCL 4 MG PO TABS: 4.0000 mg | ORAL_TABLET | Freq: Four times a day (QID) | ORAL | 0 refills | Status: DC | PRN

## 2016-09-30 MED ORDER — OXYCODONE HCL 5 MG PO TABS: 5.0000 mg | ORAL_TABLET | Freq: Once | ORAL | Status: DC

## 2016-09-30 MED ORDER — PREDNISONE 20 MG PO TABS: 40.0000 mg | ORAL_TABLET | Freq: Every day | ORAL | 0 refills | Status: DC

## 2016-09-30 NOTE — Progress Notes (Signed)
Pt leaving hospital at this time. Alert, oriented, and without c/o. Discharge instructions/prescription given/explained with pt verbalizing understanding. Pt aware to pickup meds at said pharmacy. Pt left with 2 bags, clothing, and cell phone. Followup appt noted.

## 2016-09-30 NOTE — Discharge Summary (Signed)
Physician Discharge Summary  Meghan Charles GYB:[id number] DOB: 11/22/1971 DOA: 09/27/2016  PCP: No PCP Per Patient  Admit date: 09/27/2016 Discharge date: 09/30/2016  Admitted From: Home Disposition: Home  Recommendations for Outpatient Follow-up:  1. Follow up with PCP in 1-2 weeks 2. Please obtain BMP/CBC in one week   Home Health: No Equipment/Devices: No  Discharge Condition: Stable CODE STATUS: Full code Diet recommendation: Soft and heart healthy  Brief/Interim Summary:45 y.o.female with history of Crohn ileitis, hypertension, multiple admissions in the past because of Crohn's flare most recent in May 2017, sent from GI clinic for crohn flare.  Patient was sent by gastroenterologist for surgery consult to evaluate the patient needs any surgical intervention for the management of possible bowel obstruction or and ileal Crohn's flare. Patient was evaluated by both gastroenterologist and general surgery. No surgical intervention recommended by general surgery. Initially treated with IV Solu-Medrol and gastroenterologist recommended to discharge with oral prednisone with close follow-up with Dr. Hilarie Fredrickson.  Patient reported that she had some abdominal pain and requesting pain medications. Denied nausea or vomiting. Able to tolerate diet. Both consultants recommended discharge with outpatient follow-up. Patient denied fever, chills, nausea, vomiting, chest pain, shortness of breath. She verbalized understanding of both consultants recommendation and outpatient planning. Patient reported that she can follow up with her gastroenterologist on Monday. At this time patient is medically stable to discharge home with very close outpatient follow-up.   Discharge Diagnoses:  Principal Problem:   Abdominal pain Active Problems:   Nausea and vomiting in adult   Gastroesophageal reflux disease without esophagitis   Essential hypertension   Exacerbation of Crohn's disease (HCC)   RLQ  abdominal pain   Hypokalemia    Discharge Instructions  Discharge Instructions    Call MD for:  difficulty breathing, headache or visual disturbances    Complete by:  As directed    Call MD for:  extreme fatigue    Complete by:  As directed    Call MD for:  persistant dizziness or light-headedness    Complete by:  As directed    Call MD for:  persistant nausea and vomiting    Complete by:  As directed    Call MD for:  severe uncontrolled pain    Complete by:  As directed    Call MD for:  temperature >100.4    Complete by:  As directed    Diet - low sodium heart healthy    Complete by:  As directed    Discharge instructions    Complete by:  As directed    Please don't drive today and when you are taking pain medication. Please follow up with your PCP and GI   Increase activity slowly    Complete by:  As directed        Medication List    STOP taking these medications   potassium chloride SA 20 MEQ tablet Commonly known as:  K-DUR,KLOR-CON     TAKE these medications   azaTHIOprine 50 MG tablet Commonly known as:  IMURAN Take 1 tablet (50 mg total) by mouth daily.   Hyoscyamine Sulfate SL 0.125 MG Subl Commonly known as:  LEVSIN/SL Take 2 tablets by mouth every 4-6 hours as needed What changed:  how much to take  how to take this  when to take this  reasons to take this  additional instructions   multivitamin with minerals tablet Take 1 tablet by mouth daily.   ondansetron 4 MG tablet Commonly known as:  ZOFRAN Take 1 tablet (4 mg total) by mouth every 6 (six) hours as needed for nausea.   oxyCODONE-acetaminophen 5-325 MG tablet Commonly known as:  PERCOCET/ROXICET Take 1 tablet by mouth every 8 (eight) hours as needed for severe pain. What changed:  when to take this   predniSONE 20 MG tablet Commonly known as:  DELTASONE Take 2 tablets (40 mg total) by mouth daily with breakfast. Next 2-3 days until you see your gastroenterologist. What  changed:  how much to take  additional instructions   temazepam 30 MG capsule Commonly known as:  RESTORIL Take 1 capsule (30 mg total) by mouth at bedtime as needed for sleep.      Follow-up Information    PYRTLE, Lajuan Lines, MD. Schedule an appointment as soon as possible for a visit in 3 day(s).   Specialty:  Gastroenterology Contact information: [address] [address] [phone]          Allergies  Allergen Reactions  . Morphine And Related Other (See Comments)    Headache    Consultations: Gastroenterologist and general surgery.  Procedures/Studies: Ct Abdomen Pelvis W Contrast  Result Date: 09/25/2016 CLINICAL DATA:  Right lower quadrant pain and vomiting EXAM: CT ABDOMEN AND PELVIS WITH CONTRAST TECHNIQUE: Multidetector CT imaging of the abdomen and pelvis was performed using the standard protocol following bolus administration of intravenous contrast. CONTRAST:  162m ISOVUE-300 IOPAMIDOL (ISOVUE-300) INJECTION 61%, 332mISOVUE-300 IOPAMIDOL (ISOVUE-300) INJECTION 61% COMPARISON:  MRI 03/14/2016, CT abdomen pelvis 05/03/2015 FINDINGS: Lower chest: Lung bases clear and without acute infiltrate or pleural effusion. There are partially visualized bilateral breast prostheses. Heart size is nonenlarged. No pericardial effusion at the lung bases. Hepatobiliary: No focal liver abnormality is seen. No gallstones, gallbladder wall thickening, or biliary dilatation. Pancreas: Unremarkable. No pancreatic ductal dilatation or surrounding inflammatory changes. Spleen: Normal in size without focal abnormality. Adrenals/Urinary Tract: Diminutive left adrenal gland. Stable right adrenal gland nodule measuring 2.6 cm, presumed adenoma. Kidneys demonstrate normal enhancement and no hydronephrosis. The urinary bladder is unremarkable. Stomach/Bowel: The stomach is nondilated. There is a single loop of dilated small bowel within the anterior abdomen at the level of the  umbilicus, this measures 4.5 cm in AP diameter, series 2, image number 49. Degree of bowel dilatation has decreased compared to previous MRI. There are postsurgical changes at the left aspect of the dilated loop of bowel. No upstream dilated segments of small bowel are seen. There is migration of contrast distal to the focally dilated loop of small bowel. No definite wall thickening. The appendix is visualized and is within normal limits. No significant right lower quadrant inflammatory changes are visualized. Vascular/Lymphatic: Aorta is non aneurysmal. No pathologically enlarged retroperitoneal or mesenteric lymph nodes. Reproductive: Visualized and grossly unremarkable. Other: No free air or free fluid. Musculoskeletal: No acute osseous abnormality. IMPRESSION: 1. Isolated loop of dilated small bowel in the anterior abdomen at the level of umbilicus, slightly increased in size compared to previous MRI. No definite wall thickening to suggest acute inflammation. No dilated small bowel is seen upstream to this, and there is migration of contrast past the dilated segment of small bowel, suggesting that there is no obstruction. 2. Normal appearance of the appendix. No significant right lower quadrant inflammatory process identified. 3. Stable right adrenal gland nodule, presumably adenoma. Electronically Signed   By: KiDonavan Foil.D.   On: 09/25/2016 17:58   Dg Abd Acute W/chest  Result Date: 09/08/2016 CLINICAL DATA:  Right lower quadrant  pain and vomiting. EXAM: DG ABDOMEN ACUTE W/ 1V CHEST COMPARISON:  Chest x-ray 07/05/2015 FINDINGS: Biapical opacity with volume loss causing upward retraction of the hila. Pattern is stable from prior and there is no evidence of superimposed pneumonia or edema. This pattern with septal nodular appearance by CT could be from sarcoid, previous granulomatous infection, or associated with spondyloarthropathy in this patient with history of Crohn's disease. No effusion or  pneumothorax. Stable asymmetric right apical thickening. Pulmonary vessels appear prominent at the hila, but stable from prior and no noted dilatation and pruning on 2016 chest CT to increase concern for pulmonary hypertension. Normal heart size. Nonobstructive bowel gas pattern. No concerning mass effect or calcification. No pneumoperitoneum. IMPRESSION: 1. No evidence of acute disease.  Normal bowel gas pattern. 2. Chronic lung disease as described. Electronically Signed   By: Monte Fantasia M.D.   On: 09/08/2016 08:19       Subjective: Patient was seen and examined at bedside. Patient reported some abdominal discomfort which is chronic in nature but denied nausea or vomiting. Able to tolerate diet today. She had discussion with the gastroenterologist this morning. No chest pain or shortness of breath. Ready to go home today with outpatient follow-up.   Discharge Exam: Vitals:   09/29/16 2106 09/30/16 0528  BP: (!) 157/77 121/85  Pulse: 99 98  Resp: 15 16  Temp: 98.1 F (36.7 C) 97.8 F (36.6 C)   Vitals:   09/29/16 0612 09/29/16 1400 09/29/16 2106 09/30/16 0528  BP: (!) 141/92 (!) 128/99 (!) 157/77 121/85  Pulse: 86 92 99 98  Resp:   15 16  Temp:  98.5 F (36.9 C) 98.1 F (36.7 C) 97.8 F (36.6 C)  TempSrc:  Oral Oral Oral  SpO2:  100% 99% 97%  Weight:      Height:        General: Pt is alert, awake, not in acute distress Cardiovascular: RRR, S1/S2 +, no rubs, no gallops Respiratory: CTA bilaterally, no wheezing, no rhonchi Abdominal: Soft, NT, ND, bowel sounds + Extremities: no edema, no cyanosis    The results of significant diagnostics from this hospitalization (including imaging, microbiology, ancillary and laboratory) are listed below for reference.     Microbiology: No results found for this or any previous visit (from the past 240 hour(s)).   Labs: BNP (last 3 results) No results for input(s): BNP in the last 8760 hours. Basic Metabolic Panel:  Recent  Labs Lab 09/25/16 1352 09/27/16 1057 09/28/16 0539 09/29/16 0613  NA 137 141 138 138  K 3.7 3.3* 3.5 3.5  CL 102 100* 104 107  CO2 25 28 25 25   GLUCOSE 137* 139* 91 87  BUN 18 25* 14 10  CREATININE 0.83 0.88 0.70 0.75  CALCIUM 9.9 10.1 9.2 9.1   Liver Function Tests:  Recent Labs Lab 09/25/16 1352 09/27/16 1057  AST 19 22  ALT 20 19  ALKPHOS 45 44  BILITOT 1.0 0.5  PROT 8.0 7.7  ALBUMIN 4.6 4.4    Recent Labs Lab 09/25/16 1352  LIPASE 31   No results for input(s): AMMONIA in the last 168 hours. CBC:  Recent Labs Lab 09/25/16 1352 09/27/16 1057 09/28/16 0539  WBC 15.9* 8.3 8.2  HGB 14.1 12.6 11.9*  HCT 42.2 38.3 36.2  MCV 91.5 92.3 92.6  PLT 356 292 288   Cardiac Enzymes: No results for input(s): CKTOTAL, CKMB, CKMBINDEX, TROPONINI in the last 168 hours. BNP: Invalid input(s): POCBNP CBG: No results for input(s):  GLUCAP in the last 168 hours. D-Dimer No results for input(s): DDIMER in the last 72 hours. Hgb A1c No results for input(s): HGBA1C in the last 72 hours. Lipid Profile No results for input(s): CHOL, HDL, LDLCALC, TRIG, CHOLHDL, LDLDIRECT in the last 72 hours. Thyroid function studies No results for input(s): TSH, T4TOTAL, T3FREE, THYROIDAB in the last 72 hours.  Invalid input(s): FREET3 Anemia work up No results for input(s): VITAMINB12, FOLATE, FERRITIN, TIBC, IRON, RETICCTPCT in the last 72 hours. Urinalysis    Component Value Date/Time   COLORURINE YELLOW 09/25/2016 [place] 09/25/2016 1655   LABSPEC 1.016 09/25/2016 1655   PHURINE 7.5 09/25/2016 1655   GLUCOSEU NEGATIVE 09/25/2016 1655   HGBUR NEGATIVE 09/25/2016 1655   BILIRUBINUR NEGATIVE 09/25/2016 1655   KETONESUR NEGATIVE 09/25/2016 1655   PROTEINUR NEGATIVE 09/25/2016 1655   UROBILINOGEN 0.2 10/11/2015 0924   NITRITE NEGATIVE 09/25/2016 1655   LEUKOCYTESUR NEGATIVE 09/25/2016 1655   Sepsis Labs Invalid input(s): PROCALCITONIN,  WBC,   LACTICIDVEN Microbiology No results found for this or any previous visit (from the past 240 hour(s)).   Time coordinating discharge: 25 minutes  SIGNED:   Rosita Fire, MD  [facility] 09/30/2016, 1:11 PM Pager   If 7PM-7AM, please contact night-coverage [email or web address] Password [email or web address]

## 2016-09-30 NOTE — Progress Notes (Signed)
   Patient Name: Meghan Charles Date of Encounter: 09/30/2016, 8:14 AM    Assessment and Plan  Crohn's ileitis - suspected stricture but Dr. Johney Charles reviewed films and thinks not. Says sided-side anastomosis and not stricture with dilation.  I think reaching maximal hospital benefit.  Would dc on 40 mg prednisone daily and usual meds Can advance to low fiber diet at home Dr. [name] will work on next steps as outpatient - she probably needs different IBD tx like he is thinking ? If there may still be a role for surgery.  ? Colonoscopy as outpatient - I had though not helpful and probably still not as unlikely to change Tx plans - see what Dr. [name] thinks  ? Needs GYN evaluation - did not discuss w/ her today  If he does not contact her in a few days she will contact us re: f/u plans  Thanks  Meghan Mayer, MD, [facility] [phone] (pager) [phone] after 5 PM, weekends and holidays  09/30/2016 8:14 AM   Subjective  Feels better Dr. Johney Charles told her no need for surgery   Objective  BP 121/85 (BP Location: Right Arm)   Pulse 98   Temp 97.8 F (36.6 C) (Oral)   Resp 16   Ht 5' 8"  (1.727 m)   Wt 143 lb 4.8 oz (65 kg)   SpO2 97%   BMI 21.79 kg/m  NAD

## 2016-10-03 ENCOUNTER — Telehealth: Payer: Self-pay | Admitting: Internal Medicine

## 2016-10-03 NOTE — Telephone Encounter (Signed)
Pt needing to follow-up after recent hospitalization. Pt scheduled to see Ellouise Newer PA tomorrow at 3:15pm. Pt aware of appt.

## 2016-10-04 ENCOUNTER — Telehealth: Payer: Self-pay | Admitting: *Deleted

## 2016-10-04 ENCOUNTER — Telehealth: Payer: Self-pay | Admitting: Internal Medicine

## 2016-10-04 ENCOUNTER — Other Ambulatory Visit (INDEPENDENT_AMBULATORY_CARE_PROVIDER_SITE_OTHER): Payer: Self-pay

## 2016-10-04 ENCOUNTER — Ambulatory Visit (INDEPENDENT_AMBULATORY_CARE_PROVIDER_SITE_OTHER): Payer: Self-pay | Admitting: Physician Assistant

## 2016-10-04 ENCOUNTER — Encounter: Payer: Self-pay | Admitting: Physician Assistant

## 2016-10-04 VITALS — BP 130/100 | HR 120 | Ht 66.0 in | Wt 163.0 lb

## 2016-10-04 DIAGNOSIS — K50918 Crohn's disease, unspecified, with other complication: Secondary | ICD-10-CM

## 2016-10-04 LAB — CBC WITH DIFFERENTIAL/PLATELET
BASOS ABS: 0.1 10*3/uL (ref 0.0–0.1)
BASOS PCT: 0.7 % (ref 0.0–3.0)
EOS ABS: 0 10*3/uL (ref 0.0–0.7)
Eosinophils Relative: 0.1 % (ref 0.0–5.0)
HCT: 42.6 % (ref 36.0–46.0)
Hemoglobin: 14.3 g/dL (ref 12.0–15.0)
LYMPHS ABS: 1.6 10*3/uL (ref 0.7–4.0)
LYMPHS PCT: 12.6 % (ref 12.0–46.0)
MCHC: 33.5 g/dL (ref 30.0–36.0)
MCV: 90.5 fl (ref 78.0–100.0)
MONO ABS: 0.8 10*3/uL (ref 0.1–1.0)
Monocytes Relative: 6.3 % (ref 3.0–12.0)
NEUTROS ABS: 10.1 10*3/uL — AB (ref 1.4–7.7)
NEUTROS PCT: 80.3 % — AB (ref 43.0–77.0)
PLATELETS: 388 10*3/uL (ref 150.0–400.0)
RBC: 4.71 Mil/uL (ref 3.87–5.11)
RDW: 14.8 % (ref 11.5–15.5)
WBC: 12.6 10*3/uL — ABNORMAL HIGH (ref 4.0–10.5)

## 2016-10-04 LAB — HIGH SENSITIVITY CRP: CRP HIGH SENSITIVITY: 0.98 mg/L (ref 0.000–5.000)

## 2016-10-04 MED ORDER — TRAMADOL HCL 50 MG PO TABS: 50.0000 mg | ORAL_TABLET | Freq: Four times a day (QID) | ORAL | 0 refills | Status: DC | PRN

## 2016-10-04 NOTE — Progress Notes (Addendum)
Chief Complaint: Crohn's ileitis  HPI:  Meghan Charles is a 44 year old Caucasian female, with a complicated history of Crohn's ileitis, regularly follows with Dr. Hilarie Fredrickson. The patient presents to clinic today for follow-up after recent hospitalization from 09/27/16 through 09/30/16 for her Crohn's.  Please recall patient was seen by Dr. Elmo Putt on 09/27/16 in the office, and was admitted. At that time it was reviewed that the patient had last been office on 05/19/2016 and had been struggling with recurrent right-sided abdominal pain with several ER visits. Unfortunately she had lost her insurance. Previously she was on Entyvio since December 2016 for her history of stricturing Crohn's disease (her one and only abdominal surgery for Crohn's was in May 2016). She had not had an Entyvio infusion since July and had to cancel her infusion in September due to the loss of her insurance. At time of that office visit the patient continuing to struggle with right-sided abdominal pain associated with nausea and vomiting. This had been severely limiting her daily activities. Prednisone taper was started after the patient had finished budesonide on 09/07/16. She was started at 40 mg with plans to taper by 5 mg per week.. This had recently been increased to 60 mg 2 days prior to her office visit by the emergency department when she presented with recurrent symptoms. CT scan was performed at that time which showed an isolated loop of dilated small bowel without definitive wall thickening. At that time she described being able to eat and pass bowel movements and flatness. Though her bowel movements seemed very loose. She denied any blood in her stools. The patient was struggling with frequent nausea and vomiting, all worse after eating. Her gait her pain was again located in the right upper quadrant and was cramping and sharp in nature. She denied any fever or chills. She had not been able to sleep. She also described that she  would not have any insurance for at least 2 months as she recently switched jobs. She was admitted to the hospital.  During the patient's hospital admission she was placed on prednisone 40 mg IV daily and consult did by 3 separate surgeons who all felt that surgery was "not be answered", as there was some uncertainty as to whether the stricturing was from inflammation from her Crohn's disease. She was discharged on 09/30/16 on oral prednisone 40 mg daily. She had continued with abdominal pain, but had no further nausea or vomiting. She was able to tolerate a diet at time of discharge.  Today, the patient presents to clinic and tells me that she continues with the right upper quadrant pain which is the same as it has been 4 months. This is frequently an 8-9 out of 10 and sharp/cramping in nature. The patient tells me she has been able to tolerate a regular diet and has no further nausea and vomiting. She does discuss that she is passing liquid stools though these are "very dark" in color. The patient has continued her prednisone 40 mg daily, though skipped a dose right after she arrived home from the hospital over the weekend. She wishes she did not have to be on this medication. She does request some pain medication as the Percocet made her sleep walk.  Patient denies fever, chills, blood in her stool, change in abdominal pain, dizziness or syncope.   Past Medical History:  Diagnosis Date  . Anxiety   . Colon obstruction   . Colon polyps 05/2014   TUBULAR ADENOMA AND HYPERPLASTIC  POLYP.  . Crohn's disease ([place])   . Crohn's disease of small intestine with complication ([place])   . Gallbladder polyp   . Tubular adenoma of colon     Past Surgical History:  Procedure Laterality Date  . COLON RESECTION N/A 05/11/2015   Procedure: Laparotomy with resection of Chron's Disease small bowel resection biopsy mesenteric nodule;  Surgeon: Fanny Skates, MD;  Location: [facility];  Service: General;  Laterality:  N/A;  . COLONOSCOPY W/ BIOPSIES  2015  . OTHER SURGICAL HISTORY     heel surgery  . Right Heel surgery     with plates and screws  . wisdom teeth extracted      Current Outpatient Prescriptions  Medication Sig Dispense Refill  . azaTHIOprine (IMURAN) 50 MG tablet Take 1 tablet (50 mg total) by mouth daily. 30 tablet 0  . Hyoscyamine Sulfate SL (LEVSIN/SL) 0.125 MG SUBL Take 2 tablets by mouth every 4-6 hours as needed (Patient taking differently: Place 2 tablets under the tongue every 4 (four) hours as needed (cramping). ) 90 each 2  . Multiple Vitamins-Minerals (MULTIVITAMIN WITH MINERALS) tablet Take 1 tablet by mouth daily.    . ondansetron (ZOFRAN) 4 MG tablet Take 1 tablet (4 mg total) by mouth every 6 (six) hours as needed for nausea. 20 tablet 0  . predniSONE (DELTASONE) 20 MG tablet Take 2 tablets (40 mg total) by mouth daily with breakfast. Next 2-3 days until you see your gastroenterologist.  0  . temazepam (RESTORIL) 30 MG capsule Take 1 capsule (30 mg total) by mouth at bedtime as needed for sleep. 30 capsule 1  . traMADol (ULTRAM) 50 MG tablet Take 1 tablet (50 mg total) by mouth every 6 (six) hours as needed. 30 tablet 0   No current facility-administered medications for this visit.     Allergies as of 10/04/2016 - Review Complete 09/27/2016  Allergen Reaction Noted  . Morphine and related Other (See Comments) 05/11/2015    Family History  Problem Relation Age of Onset  . Colon cancer Maternal Grandmother 73  . Esophageal cancer Neg Hx   . Stomach cancer Neg Hx   . Rectal cancer Neg Hx   . Pancreatic cancer Neg Hx     Social History   Social History  . Marital status: Single    Spouse name: N/A  . Number of children: N/A  . Years of education: N/A   Occupational History  . [place] History Main Topics  . Smoking status: Never Smoker  . Smokeless tobacco: Never Used  . Alcohol use 0.0 oz/week     Comment: 3 times per week   . Drug use: No  . Sexual activity: No   Other Topics Concern  . Not on file   Social History Narrative  . No narrative on file    Review of Systems:    Constitutional: Positive for fatigue and weakness No weight loss, fever or chills HEENT: Eyes: No change in vision               Ears, Nose, Throat:  No change in hearing or congestion Skin: No rash or itching Cardiovascular: No chest pain, chest pressure or palpitations   Respiratory: No SOB or cough Gastrointestinal: See HPI and otherwise negative Genitourinary: No dysuria or change in urinary frequency Neurological: No headache, dizziness or syncope Musculoskeletal: No new muscle or joint pain Hematologic: No bleeding or bruising Psychiatric: No history of depression or anxiety  Physical Exam:  Vital signs: BP (!) 130/100   Pulse (!) 120   Ht 5\' 6"  (1.676 m)   Wt 163 lb (73.9 kg)   BMI 26.31 kg/m   General:   Pleasant Caucasian female appears to be in mild distress, Well developed, Well nourished, alert and cooperative Head:  Normocephalic and atraumatic. Eyes:   PEERL, EOMI. No icterus. Conjunctiva pink. Ears:  Normal auditory acuity. Neck:  Supple Throat: Oral cavity and pharynx without inflammation, swelling or lesion. Teeth in good condition. Lungs: Respirations even and unlabored. Lungs clear to auscultation bilaterally.   No wheezes, crackles, or rhonchi.  Heart: Normal S1, S2. No MRG. Regular rate and rhythm. No peripheral edema, cyanosis or pallor.  Abdomen:  Soft, mild distention, right upper quadrant tenderness to minimal palpation No rebound or guarding. Normal bowel sounds. No appreciable masses or hepatomegaly. Rectal:  Not performed.  Msk:  Symmetrical without gross deformities.  Extremities:  Without edema, no deformity or joint abnormality. Normal ROM. Neurologic:  Alert and  oriented x4;  grossly normal neurologically.  Skin:   Dry and intact without significant lesions or rashes. Psychiatric:  Oriented to person, place and time. Demonstrates good judgement and reason without abnormal affect or behaviors.  MOST RECENT LABS AND IMAGING: CBC    Component Value Date/Time   WBC 8.2 09/28/2016 0539   RBC 3.91 09/28/2016 0539   HGB 11.9 (L) 09/28/2016 0539   HCT 36.2 09/28/2016 0539   PLT 288 09/28/2016 0539   MCV 92.6 09/28/2016 0539   MCH 30.4 09/28/2016 0539   MCHC 32.9 09/28/2016 0539   RDW 14.5 09/28/2016 0539   LYMPHSABS 1.7 07/26/2016 1300   MONOABS 0.4 07/26/2016 1300   EOSABS 0.1 07/26/2016 1300   BASOSABS 0.0 07/26/2016 1300    CMP     Component Value Date/Time   NA 138 09/29/2016 0613   K 3.5 09/29/2016 0613   CL 107 09/29/2016 0613   CO2 25 09/29/2016 0613   GLUCOSE 87 09/29/2016 0613   BUN 10 09/29/2016 0613   CREATININE 0.75 09/29/2016 0613   CALCIUM 9.1 09/29/2016 0613   PROT 7.7 09/27/2016 1057   ALBUMIN 4.4 09/27/2016 1057   AST 22 09/27/2016 1057   ALT 19 09/27/2016 1057   ALKPHOS 44 09/27/2016 1057   BILITOT 0.5 09/27/2016 1057   GFRNONAA >60 09/29/2016 0613   GFRAA >60 09/29/2016 0613   CT ABD/PELVIS w contrast, 09/25/16: COMPARISON:  MRI 03/14/2016, CT abdomen pelvis 05/03/2015  FINDINGS: Lower chest: Lung bases clear and without acute infiltrate or pleural effusion. There are partially visualized bilateral breast prostheses. Heart size is nonenlarged. No pericardial effusion at the lung bases.  Hepatobiliary: No focal liver abnormality is seen. No gallstones, gallbladder wall thickening, or biliary dilatation.  Pancreas: Unremarkable. No pancreatic ductal dilatation or surrounding inflammatory changes.  Spleen: Normal in size without focal abnormality.  Adrenals/Urinary Tract: Diminutive left adrenal gland. Stable right adrenal gland nodule measuring 2.6 cm, presumed adenoma. Kidneys demonstrate normal enhancement and no hydronephrosis. The urinary bladder is unremarkable.  Stomach/Bowel: The stomach is nondilated. There is  a single loop of dilated small bowel within the anterior abdomen at the level of the umbilicus, this measures 4.5 cm in AP diameter, series 2, image number 49. Degree of bowel dilatation has decreased compared to previous MRI. There are postsurgical changes at the left aspect of the dilated loop of bowel. No upstream dilated segments of small bowel are seen. There is migration of contrast distal to  the focally dilated loop of small bowel. No definite wall thickening.  The appendix is visualized and is within normal limits. No significant right lower quadrant inflammatory changes are visualized.  Vascular/Lymphatic: Aorta is non aneurysmal. No pathologically enlarged retroperitoneal or mesenteric lymph nodes.  Reproductive: Visualized and grossly unremarkable.  Other: No free air or free fluid.  Musculoskeletal: No acute osseous abnormality.  IMPRESSION: 1. Isolated loop of dilated small bowel in the anterior abdomen at the level of umbilicus, slightly increased in size compared to previous MRI. No definite wall thickening to suggest acute inflammation. No dilated small bowel is seen upstream to this, and there is migration of contrast past the dilated segment of small bowel, suggesting that there is no obstruction. 2. Normal appearance of the appendix. No significant right lower quadrant inflammatory process identified. 3. Stable right adrenal gland nodule, presumably adenoma.   Electronically Signed   By: Donavan Foil M.D.   On: 09/25/2016 17:58  Assessment: 1. Ileal Crohn's: Patient's symptoms continue since discharge from hospital over the weekend, she has no further nausea and vomiting but continues with a right upper quadrant pain which hinders her daily activities. She continues on Prednisone 40 mg daily. Continued concern for recurrent stricture at her small bowel anastomosis. At time of recent admission 3 separate surgeons saw patient and did not feel surgical  intervention was necessary. Patient's situation is complicated by her lack of insurance. 2. History of latent TB: Patient has completed therapy and will need no further Quantiferon Gold testing per ID  Plan: Discussed this case in detail with Dr. Hilarie Fredrickson to establish current plan of care, per his recommendations: 1. Ordered MR enterography 2. Ordered CRP and CBC 3. Referred patient to Dr. Drue Flirt a colorectal surgeon at [facility]. Their number is [phone] their request due to patient's lack of insurance she will need to come for a financial planning meeting prior to her consultation, typically in these instances patient see a resident first, but contact is going to talk to Dr. Drue Flirt as Dr. Hilarie Fredrickson is requesting her expert opinion only 4. Advised the patient to continue her Prednisone 40 mg until Saturday and then taper down by 5 mg per week. During this time should be she should be observant of symptoms including increased abdominal pain, nausea or vomiting, if she experiences these she should call our office and remain at her current level of prednisone. She verbalized understanding. 5. Continue to recommend avoidance of all NSAIDs. I reviewed this with the patient today. 6. Patient did request pain medication, prescribed Tramadol 50 mg every 6 hours when necessary 7. Patient is trying to get cone assistance at this time, we did talk about how this will retro-actively help her pay for her medical bills 8. Per Dr. Hilarie Fredrickson, he appreciates Dr. Geronimo Boot recommendations of this complicated Crohn's patient. If she does not believe surgery is necessary at this time, could consider Stelara in the future 9. Patient to follow in clinic with Dr. Hilarie Fredrickson only.  Ellouise Newer, PA-C [facility] 10/04/2016, 12:47 PM   Addendum: Reviewed and agree with management. Difficult situation as Meghan Charles continues to have recurrent right-sided abdominal pain in the setting of known ileal Crohn's  disease. She has had abnormal imaging with dilation in the right abdomen. It is difficult to know if this is related to prior surgery, active Crohn's or both. Entyvio seemed to help but was never effective at keeping her symptoms under great control. The pneumonitis and pulmonary infection that led ICU stay  after initial dose of Remicade along with history of treated latent TB has caused [place] to avoid anti-TNF therapy. Anti-TNF therapy in the setting of known Crohn's disease with previous stricture is definitely the best treatment option but given her history is higher risk. Meghan Charles is an option but also would carry risk. I greatly appreciate Dr. Geronimo Boot opinion regarding the need for a second small bowel resection and I can work with her to make decisions regarding medical therapy for her Crohn's going forward. Will try to taper prednisone slowly over the next 6-8 weeks. Meghan Charles knows to maintain a very low residue diet in the interim. Meghan Bears, MD

## 2016-10-04 NOTE — Patient Instructions (Addendum)
You have been scheduled for an MRI at [facility] on  Saturday 10/07/2016 at 12pm. Your appointment time is 12pm Arrive at 10:30am. . Please make certain not to have anything to eat or drink 6 hours prior to your test. In addition, if you have any metal in your body, have a pacemaker or defibrillator, please be sure to let your ordering physician know. This test typically takes 45 minutes to 1 hour to complete.  Go to the basement for labs today   We are referring you to see Dr Drue Flirt at [facility] as discussed  Per Ellouise Newer, PA-C  Decrease by 5 mg every 7 days  We will send in Tramadol to your pharmacy   Low residue Diet given  Avoid NSAIDS

## 2016-10-04 NOTE — Telephone Encounter (Signed)
Patient has been scheduled to see Dr Drue Flirt @ [facility] on 10/10/16 @ 3 pm. New patient paperwork will be sent to patient per Kennyth Lose whom appointment was scheduled with. 102 pages of records have been sent to fax [phone] and patient will also come by office to pick up original records to have available in the case that faxed records cannot be found at time of office visit. I will request imaging placed on CD and send to ATTN: Blanch Media Ann/Maria [facility] Department of General Surgery [facility] [place], [place] [address]  fex ex# [phone] (Use in section #7 on fed ex form) Billing acct# [id number] (use in section #2)  Patient will also go for financial counseling on the day of her appointment with Dr Drue Flirt (Dr Drue Flirt has agreed to see patient even though the resident and attending physician typically see uninsured patients).

## 2016-10-04 NOTE — Telephone Encounter (Signed)
I have left verbal authorization for patient's tramadol at [facility] since they state they did not get our faxed authorization.

## 2016-10-05 NOTE — Telephone Encounter (Signed)
I have spoken to [facility] @ [facility] (phone [phone] fax [phone]) and have asked that she make a CD with all abdominal imaging from the last 2 years and send to Ulysees Barns at [facility].  She states that she will do this. She has also pushed all imaging through "[place]" which she states [facility] should have access to.

## 2016-10-07 ENCOUNTER — Ambulatory Visit (HOSPITAL_COMMUNITY): Payer: Self-pay

## 2016-10-07 ENCOUNTER — Ambulatory Visit (HOSPITAL_COMMUNITY)
Admission: RE | Admit: 2016-10-07 | Discharge: 2016-10-07 | Disposition: A | Discharge status: Home / Self Care | Payer: Self-pay | Source: Ambulatory Visit | Attending: Physician Assistant | Admitting: Physician Assistant

## 2016-10-07 DIAGNOSIS — D3501 Benign neoplasm of right adrenal gland: Secondary | ICD-10-CM | POA: Insufficient documentation

## 2016-10-07 DIAGNOSIS — K50918 Crohn's disease, unspecified, with other complication: Secondary | ICD-10-CM | POA: Insufficient documentation

## 2016-10-07 MED ORDER — GADOBENATE DIMEGLUMINE 529 MG/ML IV SOLN
15.0000 mL | Freq: Once | INTRAVENOUS | Status: AC | PRN
  Administered 2016-10-07: 15 mL via INTRAVENOUS

## 2016-10-13 NOTE — Progress Notes (Signed)
That is wonderful. JLL

## 2016-10-24 ENCOUNTER — Encounter (HOSPITAL_COMMUNITY): Admission: RE | Admit: 2016-10-24 | Payer: BLUE CROSS/BLUE SHIELD | Source: Ambulatory Visit

## 2016-11-13 ENCOUNTER — Encounter (HOSPITAL_COMMUNITY): Admission: RE | Admit: 2016-11-13 | Payer: BLUE CROSS/BLUE SHIELD | Source: Ambulatory Visit

## 2016-11-22 ENCOUNTER — Telehealth: Payer: Self-pay | Admitting: Internal Medicine

## 2016-11-22 MED ORDER — TEMAZEPAM 30 MG PO CAPS: 30.0000 mg | ORAL_CAPSULE | Freq: Every evening | ORAL | 1 refills | Status: DC | PRN

## 2016-11-22 NOTE — Telephone Encounter (Signed)
Rx sent 

## 2017-01-22 ENCOUNTER — Other Ambulatory Visit: Payer: Self-pay | Admitting: Internal Medicine

## 2017-02-15 HISTORY — PX: LAPAROSCOPIC LYSIS OF ADHESIONS: SHX5905

## 2017-02-19 HISTORY — PX: OTHER SURGICAL HISTORY: SHX169

## 2017-03-21 ENCOUNTER — Telehealth: Payer: Self-pay | Admitting: Internal Medicine

## 2017-03-22 MED ORDER — TEMAZEPAM 30 MG PO CAPS: 30.0000 mg | ORAL_CAPSULE | Freq: Every evening | ORAL | 1 refills | Status: DC | PRN

## 2017-03-22 NOTE — Telephone Encounter (Signed)
Rx sent 

## 2017-04-10 ENCOUNTER — Encounter: Payer: Self-pay | Admitting: *Deleted

## 2017-04-25 ENCOUNTER — Ambulatory Visit: Payer: Self-pay | Admitting: Internal Medicine

## 2017-05-21 ENCOUNTER — Telehealth: Payer: Self-pay | Admitting: Internal Medicine

## 2017-05-21 MED ORDER — TEMAZEPAM 30 MG PO CAPS: 30.0000 mg | ORAL_CAPSULE | Freq: Every evening | ORAL | 0 refills | Status: DC | PRN

## 2017-05-21 NOTE — Telephone Encounter (Signed)
Rx sent 

## 2017-06-06 ENCOUNTER — Telehealth: Payer: Self-pay | Admitting: Internal Medicine

## 2017-06-06 ENCOUNTER — Ambulatory Visit (INDEPENDENT_AMBULATORY_CARE_PROVIDER_SITE_OTHER): Payer: 59 | Admitting: Internal Medicine

## 2017-06-06 ENCOUNTER — Other Ambulatory Visit (INDEPENDENT_AMBULATORY_CARE_PROVIDER_SITE_OTHER): Payer: 59

## 2017-06-06 ENCOUNTER — Encounter: Payer: Self-pay | Admitting: Internal Medicine

## 2017-06-06 VITALS — BP 112/68 | HR 90 | Ht 68.0 in | Wt 159.8 lb

## 2017-06-06 DIAGNOSIS — K50012 Crohn's disease of small intestine with intestinal obstruction: Secondary | ICD-10-CM

## 2017-06-06 DIAGNOSIS — Z8611 Personal history of tuberculosis: Secondary | ICD-10-CM

## 2017-06-06 DIAGNOSIS — Z79899 Other long term (current) drug therapy: Secondary | ICD-10-CM

## 2017-06-06 DIAGNOSIS — K50918 Crohn's disease, unspecified, with other complication: Secondary | ICD-10-CM

## 2017-06-06 LAB — CBC WITH DIFFERENTIAL/PLATELET
BASOS ABS: 0.1 10*3/uL (ref 0.0–0.1)
BASOS PCT: 1 % (ref 0.0–3.0)
EOS ABS: 0.1 10*3/uL (ref 0.0–0.7)
Eosinophils Relative: 1.5 % (ref 0.0–5.0)
HEMATOCRIT: 39.6 % (ref 36.0–46.0)
Hemoglobin: 13.5 g/dL (ref 12.0–15.0)
LYMPHS ABS: 1.7 10*3/uL (ref 0.7–4.0)
LYMPHS PCT: 27.2 % (ref 12.0–46.0)
MCHC: 34 g/dL (ref 30.0–36.0)
MCV: 90.5 fl (ref 78.0–100.0)
Monocytes Absolute: 0.5 10*3/uL (ref 0.1–1.0)
Monocytes Relative: 7.2 % (ref 3.0–12.0)
NEUTROS ABS: 4 10*3/uL (ref 1.4–7.7)
NEUTROS PCT: 63.1 % (ref 43.0–77.0)
PLATELETS: 322 10*3/uL (ref 150.0–400.0)
RBC: 4.38 Mil/uL (ref 3.87–5.11)
RDW: 12.7 % (ref 11.5–15.5)
WBC: 6.4 10*3/uL (ref 4.0–10.5)

## 2017-06-06 LAB — COMPREHENSIVE METABOLIC PANEL
ALT: 14 U/L (ref 0–35)
AST: 12 U/L (ref 0–37)
Albumin: 4.5 g/dL (ref 3.5–5.2)
Alkaline Phosphatase: 65 U/L (ref 39–117)
BUN: 19 mg/dL (ref 6–23)
CALCIUM: 9.4 mg/dL (ref 8.4–10.5)
CHLORIDE: 104 meq/L (ref 96–112)
CO2: 26 meq/L (ref 19–32)
Creatinine, Ser: 0.76 mg/dL (ref 0.40–1.20)
GFR: 87.17 mL/min (ref >60.00)
GLUCOSE: 97 mg/dL (ref 70–99)
Potassium: 4.1 mEq/L (ref 3.5–5.1)
Sodium: 139 mEq/L (ref 135–145)
Total Bilirubin: 0.3 mg/dL (ref 0.2–1.2)
Total Protein: 7.4 g/dL (ref 6.0–8.3)

## 2017-06-06 LAB — HIGH SENSITIVITY CRP: CRP, High Sensitivity: 2.5 mg/L (ref 0.000–5.000)

## 2017-06-06 NOTE — Patient Instructions (Addendum)
Your physician has requested that you go to the basement for the following lab work before leaving today: CBC, CMP, CRP, Hep B  Please purchase the following medications over the counter and take as directed: Miralax 8 grams (1/2 capful) every other day  Continue Benefiber.  You have been scheduled for an appointment with Dr Chase Caller. However, this date will need to be moved to a sooner date. We will work on this and call you back.  Follow up with Dr Hilarie Fredrickson in 12 weeks.  We are planning to get you on Humira.  If you are age 43 or older, your body mass index should be between 23-30. Your Body mass index is 24.3 kg/m. If this is out of the aforementioned range listed, please consider follow up with your Primary Care Provider.  If you are age 57 or younger, your body mass index should be between 19-25. Your Body mass index is 24.3 kg/m. If this is out of the aformentioned range listed, please consider follow up with your Primary Care Provider.

## 2017-06-06 NOTE — Telephone Encounter (Signed)
-----   Message from Thayer Headings, MD sent at 06/06/2017  2:09 PM EDT ----- I do think it is ok from a latent tb standpoint assuming she finished it.  It doesn't look like she ever came back for follow up so I am not sure that she did take it though, but you can just confirm with her.  She should have had enough refills to do it. With her chronic lung disease, might be prudent to repeat a CXR - or go to pulmonary as you are doing. Rob ----- Message ----- From: Jerene Bears, MD Sent: 06/06/2017  12:32 PM To: Thayer Headings, MD  Hi Rob,  I wanted to contact you about [name]. She has continued to struggle with her ileal Crohn's disease and has now had 3 surgeries for strictures. I am recommending Humira. She does have a history of latent TB and you treated her with isoniazid for 6 months. Do you think it is okay for her to receive Humira specifically from a TB standpoint? Thanks for your help Zenovia Jarred

## 2017-06-06 NOTE — Telephone Encounter (Signed)
Thanks to Dr. Linus Salmons for his advice  Meghan Charles, please confirm that Meghan Charles completed the 6 month as prescribed. She previously told me that she did complete but I would like to confirm She will be seeing pulmonology and I expect they would repeat chest x-ray  Once pulmonology visit completed, will plan to start Humira for her Crohn's disease

## 2017-06-06 NOTE — Progress Notes (Signed)
Subjective:    Patient ID: Meghan Charles, female    DOB: 12-15-1971, 46 y.o.   MRN: [id number]  HPI Meghan Charles is a 46 year old female with a history of Crohn's ileitis who is seen for follow-up. I have not personally seen her since October 2017 but she has been following with Dr. Drue Flirt at [facility] having had further surgical management of her Crohn's disease.  Due to intermittent issues with right-sided abdominal pain, partial obstructions with nausea and vomiting she went for surgery in November 2017. She had a stricture and segmental resection at her previous small bowel anastomosis followed by ileostomy placement. The ileostomy remained in place until March 2018 when she went for ileostomy reversal. She followed with Dr. Drue Flirt after this reversal operation and was cleared to return to my clinic.  She reports she has been living in fear of recurrent Crohn's disease. Last week she was in the emergency department for nausea and vomiting along with some right-sided abdominal pain. She was concerned that her Crohn's had recurred. CT scan of the abdomen and pelvis with IV contrast was performed on 05/28/2017. I reviewed this study which showed no acute findings in the abdomen or pelvis. There was no evidence of active or recurrent Crohn's disease. There was similar appearance of a right adrenal nodule which had been stable since 2015. The appendix was normal. There was more stool in the cecum than on prior studies. Her liver, gallbladder, spleen and pancreas were within normal limits. As were her kidneys.  She reports she still has some intermittent right-sided abdominal soreness but has been able to eat a more regular diet. She is even tried nuts and popcorn. She has intermittent waves of nausea but rarely vomiting. No fevers or chills. She denies heartburn.  After surgery she was tried on [place]. MiraLAX gave her diarrhea and so now she is using 2 tablespoons of Benefiber daily.  She is having on average one fairly large mostly formed stool per day without blood or melena.  She is working daily and not had to miss work recently other than last Monday when she was in the ER   Review of Systems As per history of present illness, otherwise negative  Current Medications, Allergies, Past Medical History, Past Surgical History, Family History and Social History were reviewed in [employer] record.     Objective:   Physical Exam BP 112/68   Pulse 90   Ht 5\' 8"  (1.727 m)   Wt 159 lb 12.8 oz (72.5 kg)   BMI 24.30 kg/m  Constitutional: Well-developed and well-nourished. No distress. HEENT: Normocephalic and atraumatic. Oropharynx is clear and moist. Conjunctivae are normal.  No scleral icterus. Neck: Neck supple. Trachea midline. Cardiovascular: Normal rate, regular rhythm and intact distal pulses. No M/R/G Pulmonary/chest: Effort normal and breath sounds normal. No wheezing, rales or rhonchi. Abdominal: Soft, Right middle and lower quadrant tenderness without rebound or guarding, well-healed abdominal incisions, nondistended. Bowel sounds active throughout. There are no masses palpable. No hepatosplenomegaly. Extremities: no clubbing, cyanosis, or edema Neurological: Alert and oriented to person place and time. Skin: Skin is warm and dry. Psychiatric: Normal mood and affect. Behavior is normal.  Labs pending from today (CBC, CMP, CRP, Hep B serologies ordered) CT reviewed as per HPI    Assessment & Plan:  46 year old female with a history of Crohn's ileitis who is seen for follow-up. I have not personally seen her since October 2017 but she has been following with Dr.  Ashburn at [facility] having had further surgical management of her Crohn's disease.  1. Ileal Crohn's -- her Crohn's disease has been aggressive and stricturing. She is now had 2 segmental small bowel resections and then ileostomy reversal 3 months ago. Fortunately her recent CT  scan did not show evidence of recurrent Crohn's disease but recurrence risk is very high without therapy. Also fortunately she has been off of steroids.  My recommendation is that we place her on anti-TNF therapy as this is the best proven medication to prevent recurrence of Crohn's and strictures. She tried Entyvio for some time which was not beneficial for her. Of note, she received 1 dose of Remicade around September 2016 after which time she developed a pneumonitis requiring hospitalization. She followed up with pulmonary for many months after this hospitalization. 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 Linus Salmons. We picked Entyvio over another anti-TNF given her pneumonitis. It should be noted that she was also on steroids at the time of this infection and hospitalization.  Considering all of these issues and the high risk of recurrent Crohn's my recommendation is that we begin Humira with induction. I would like her to follow-up with Dr. Chase Caller with pulmonology for his opinion prior to Humira start. I also will check with Dr. Linus Salmons for his opinion regarding her history of latent TB. Given her treatment my opinion is that Humira is okay but I would like his opinion as well.  Regarding Humira, we discussed the risks in great detail including the risk of infection (including reactivation of latent TB and underlying viral hepatitis), hepatotoxicity, leukopenia, pancreatitis, nausea, malignancy (specifically lymphoma), demyelinating disease, and even heart failure. Aylah understands these risks and wants to proceed with Humira.  Regarding her intermittent right-sided discomfort given the higher volume of cecal stool I would like her to continue Benefiber 2 tablespoons a day and begin MiraLAX 8 g every other day. Perhaps some element of constipation is causing her pain and nausea vomiting which led her to the ER  recently. If this is an element of early recurrent small bowel Crohn's, then beginning Humira is the best treatment.  Follow-up with me in 8-12 weeks, sooner if needed 45 minutes spent with the patient today. Greater than 50% was spent in counseling and coordination of care with the patient

## 2017-06-07 ENCOUNTER — Encounter: Payer: Self-pay | Admitting: Internal Medicine

## 2017-06-07 LAB — HEPATITIS B SURFACE ANTIBODY,QUALITATIVE: HEP B S AB: NEGATIVE

## 2017-06-07 LAB — HEPATITIS B CORE ANTIBODY, TOTAL: HEP B C TOTAL AB: NONREACTIVE

## 2017-06-07 LAB — HEPATITIS B SURFACE ANTIGEN: Hepatitis B Surface Ag: NEGATIVE

## 2017-06-07 NOTE — Telephone Encounter (Signed)
Error

## 2017-06-08 ENCOUNTER — Telehealth: Payer: Self-pay | Admitting: Internal Medicine

## 2017-06-08 NOTE — Telephone Encounter (Signed)
Left message for pt to call back  °

## 2017-06-11 NOTE — Telephone Encounter (Signed)
Pt did not return the call. 

## 2017-06-12 ENCOUNTER — Telehealth: Payer: Self-pay | Admitting: Internal Medicine

## 2017-06-12 NOTE — Telephone Encounter (Signed)
Pharmacy calling to state humira for patient has been approved but the copay is around $5000. Pharmacy is working on getting patient assistance for medication. FYI

## 2017-06-12 NOTE — Telephone Encounter (Signed)
I have spoken with Dottie with [facility], who states Dr. Hilarie Fredrickson had reached out to MR via staff message on 06/06/17. Per Carla Drape, pt has hx of TB but treated with ID. Dr. Hilarie Fredrickson wishes to start pt on biological medication for crohn's but would like for pt to be cleared by MR first. Pt is scheduled wit MR on 08/10/17. Dr. Hilarie Fredrickson request sooner apt.  MR please advise. Thanks.

## 2017-06-13 ENCOUNTER — Telehealth: Payer: Self-pay | Admitting: Internal Medicine

## 2017-06-13 NOTE — Telephone Encounter (Signed)
Patient returned call, states she could not come to the office for appt tomorrow 06/28 or 06/29 because of work.  CB is [phone].

## 2017-06-13 NOTE — Telephone Encounter (Signed)
lmom tcb x1 to pt, please see MR's message.

## 2017-06-13 NOTE — Telephone Encounter (Signed)
I plan to open Friday. Or else last appt for 06/21/17 in AM clinic. Or else, see anyone else - BQ, PM , RA or VS or JN  Dr. Brand Males, M.D., [facility].C.P Pulmonary and Critical Care Medicine Staff Physician [place] [facility] Pager: [phone], If no answer or between  15:00h - 7:00h: call [phone]  06/13/2017 4:04 PM

## 2017-06-13 NOTE — Telephone Encounter (Signed)
Pt will bring new insurance card to the office as soon as she gets it so we can send in to [facility] for Humira.

## 2017-06-13 NOTE — Telephone Encounter (Signed)
Pt scheduled to see MR on 7/5 at 12:00- appt ok'ed by MR.  Nothing further needed.

## 2017-06-13 NOTE — Telephone Encounter (Signed)
Please advise MR of another day or provider to work patient in. Thanks.

## 2017-06-13 NOTE — Telephone Encounter (Signed)
See if the patient can come tomorrow or day after esp AM v PM. I have not opened clinic formally yet but likely will  Thanks  Dr. Brand Males, M.D., [facility].C.P Pulmonary and Critical Care Medicine Staff Physician [place] [facility] Pager: [phone], If no answer or between  15:00h - 7:00h: call [phone]  06/13/2017 2:01 PM

## 2017-06-14 NOTE — Telephone Encounter (Signed)
Patient is scheduled to see Dr Chase Caller on 06/21/17 for pulmonary clearance for Humira and she states that she DID finish her complete TB treatment from ID previously. I advised to make sure she lets Dr Chase Caller know that Dr Linus Salmons requests a Chest xray prior to Humira initiation due to her history of chronic lung disease. Patient verbalizes understanding. She also states that she is supposed to be receiving her Humira today in the mail. I have asked that she HOLD OFF on taking the medication until cleared by Dr Chase Caller to do so. She also verbalizes understanding of this.

## 2017-06-18 ENCOUNTER — Telehealth: Payer: Self-pay | Admitting: Internal Medicine

## 2017-06-18 NOTE — Telephone Encounter (Signed)
Patient notified of recommendations She stated she will go to the ED

## 2017-06-18 NOTE — Telephone Encounter (Signed)
For severe pain and vomiting she should go to the emergency room and she may have an obstruction

## 2017-06-18 NOTE — Telephone Encounter (Signed)
Left message for patient to call back  

## 2017-06-18 NOTE — Telephone Encounter (Signed)
Patient has a history of crohn's ileitis she had recent ileostomy take down in March and is a patient of Dr. Hilarie Fredrickson. Patient reports that she had 6-7 episodes of vomiting on Saturday and 3 on Sunday.  She is c/o abdominal pain, on the right side with cramping and "its a tightning feeling, she is also having diarrhea a few episodes a day. She denies fever or rectal bleeding. She is not maintained on any medications for her crohn's currently. She is to start on Humira, but must have a clear chest x-ray due to recent pneumonia. She is scheduled for a chest x-ray on Thursday. She is out of phenergan and is on a light diet.  No vomiting today. She reports that she is in " terrible pain".  Dr.

## 2017-06-19 ENCOUNTER — Encounter (HOSPITAL_COMMUNITY): Payer: Self-pay | Admitting: Emergency Medicine

## 2017-06-19 ENCOUNTER — Emergency Department (HOSPITAL_COMMUNITY): Payer: 59

## 2017-06-19 ENCOUNTER — Emergency Department (HOSPITAL_COMMUNITY)
Admission: EM | Admit: 2017-06-19 | Discharge: 2017-06-19 | Disposition: A | Discharge status: Home / Self Care | Payer: 59 | Attending: Emergency Medicine | Admitting: Emergency Medicine

## 2017-06-19 DIAGNOSIS — I10 Essential (primary) hypertension: Secondary | ICD-10-CM | POA: Insufficient documentation

## 2017-06-19 DIAGNOSIS — R1031 Right lower quadrant pain: Secondary | ICD-10-CM | POA: Insufficient documentation

## 2017-06-19 LAB — COMPREHENSIVE METABOLIC PANEL
ALBUMIN: 4.4 g/dL (ref 3.5–5.0)
ALK PHOS: 61 U/L (ref 38–126)
ALT: 18 U/L (ref 14–54)
ANION GAP: 9 (ref 5–15)
AST: 20 U/L (ref 15–41)
BILIRUBIN TOTAL: 0.5 mg/dL (ref 0.3–1.2)
BUN: 16 mg/dL (ref 6–20)
CALCIUM: 9.7 mg/dL (ref 8.9–10.3)
CO2: 24 mmol/L (ref 22–32)
CREATININE: 0.77 mg/dL (ref 0.44–1.00)
Chloride: 106 mmol/L (ref 101–111)
GFR calc Af Amer: >60 mL/min (ref >60)
GFR calc non Af Amer: >60 mL/min (ref >60)
GLUCOSE: 89 mg/dL (ref 65–99)
Potassium: 4.3 mmol/L (ref 3.5–5.1)
Sodium: 139 mmol/L (ref 135–145)
TOTAL PROTEIN: 8.3 g/dL — AB (ref 6.5–8.1)

## 2017-06-19 LAB — URINALYSIS, ROUTINE W REFLEX MICROSCOPIC
BILIRUBIN URINE: NEGATIVE
Glucose, UA: NEGATIVE mg/dL
HGB URINE DIPSTICK: NEGATIVE
Ketones, ur: 5 mg/dL — AB
Leukocytes, UA: NEGATIVE
NITRITE: NEGATIVE
Protein, ur: NEGATIVE mg/dL
Specific Gravity, Urine: 1.033 — ABNORMAL HIGH (ref 1.005–1.030)
pH: 5 (ref 5.0–8.0)

## 2017-06-19 LAB — CBC
HCT: 39.3 % (ref 36.0–46.0)
HEMOGLOBIN: 13.3 g/dL (ref 12.0–15.0)
MCH: 30.2 pg (ref 26.0–34.0)
MCHC: 33.8 g/dL (ref 30.0–36.0)
MCV: 89.1 fL (ref 78.0–100.0)
Platelets: 328 10*3/uL (ref 150–400)
RBC: 4.41 MIL/uL (ref 3.87–5.11)
RDW: 12.7 % (ref 11.5–15.5)
WBC: 6.1 10*3/uL (ref 4.0–10.5)

## 2017-06-19 LAB — LIPASE, BLOOD: Lipase: 35 U/L (ref 11–51)

## 2017-06-19 MED ORDER — SODIUM CHLORIDE 0.9 % IV BOLUS (SEPSIS)
2000.0000 mL | Freq: Once | INTRAVENOUS | Status: AC
  Administered 2017-06-19: 2000 mL via INTRAVENOUS

## 2017-06-19 MED ORDER — PROMETHAZINE HCL 25 MG/ML IJ SOLN
25.0000 mg | Freq: Once | INTRAMUSCULAR | Status: AC
  Administered 2017-06-19: 25 mg via INTRAVENOUS
  Filled 2017-06-19: qty 1

## 2017-06-19 MED ORDER — ONDANSETRON 4 MG PO TBDP
4.0000 mg | ORAL_TABLET | Freq: Once | ORAL | Status: AC | PRN
  Administered 2017-06-19: 4 mg via ORAL
  Filled 2017-06-19: qty 1

## 2017-06-19 MED ORDER — SODIUM CHLORIDE 0.9 % IV SOLN: INTRAVENOUS | Status: DC

## 2017-06-19 MED ORDER — HYDROMORPHONE HCL 1 MG/ML IJ SOLN
1.0000 mg | Freq: Once | INTRAMUSCULAR | Status: AC
  Administered 2017-06-19: 1 mg via INTRAVENOUS
  Filled 2017-06-19: qty 1

## 2017-06-19 NOTE — ED Notes (Signed)
Pt reports generalized abd pain d/t crohn's disease.  Denies any n/v/d.  Pt appears in NAD.

## 2017-06-19 NOTE — ED Provider Notes (Signed)
[facility] DEPT Provider Note   CSN: [id number] Arrival date & time: 06/19/17  1522     History   Chief Complaint Chief Complaint  Patient presents with  . Emesis  . Abdominal Pain    HPI Meghan Charles is a 46 y.o. female.  Patient is a 46 yo female with PMH of croh's disease with history of partial ileal resection 2016 with ileostomy due to small bowel obstruction.  Ileostomy was closed on 02/19/17 since then there has been multiple concerns for possible obstructions since then.  She has been seen at [facility] for these the most recent occurring 05/28/17 where she was given fluids and a CT was performed that was negative for obstruction.  Patient was started on miralax by her GI doctor dr.pyrtle, with limited change in bowel movements. Patient is not sexually active at this time and takes Depot Provera for contraception.       Emesis   This is a recurrent problem. The current episode started 2 days ago. The problem occurs 2 to 4 times per day. The problem has not changed since onset.The emesis has an appearance of stomach contents. There has been no fever. Associated symptoms include abdominal pain. Pertinent negatives include no arthralgias, no chills, no cough, no diarrhea (1 episode of loose stools per day since ostomy closure) and no fever.  Abdominal Pain   This is a recurrent problem. The current episode started 2 days ago. The problem has not changed since onset.The pain is associated with eating. The pain is located in the RLQ. Associated symptoms include nausea and vomiting. Pertinent negatives include fever, diarrhea (1 episode of loose stools per day since ostomy closure), dysuria, hematuria and arthralgias. Nothing relieves the symptoms. Her past medical history is significant for Crohn's disease.    Past Medical History:  Diagnosis Date  . Anxiety   . Colon obstruction ([place])   . Colon polyps 05/2014   TUBULAR ADENOMA AND HYPERPLASTIC POLYP.  . Crohn's disease ([place])    . Crohn's disease of small intestine with complication ([place])   . Gallbladder polyp   . Tubular adenoma of colon     Patient Active Problem List   Diagnosis Date Noted  . Hypokalemia   . 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 RLQ abdominal pain   . Pollen allergies 03/26/2015  . Immunosuppressed status ([place]) 03/26/2015  . Exacerbation of Crohn's disease ([place])   . High risk medication use   . Diarrhea 01/18/2015  . Crohn's colitis ([place]) 12/26/2014  . Essential hypertension   . CAP (community acquired pneumonia) 10/02/2014  . Tachycardia 10/02/2014  . Physical deconditioning 10/02/2014  . PNA (pneumonia) 09/24/2014  . Pleural effusion 09/24/2014  . Nausea with vomiting 09/24/2014  . Leukocytosis 09/24/2014  . Sinus tachycardia 09/24/2014  . Dehydration 09/24/2014  . Gastroesophageal reflux disease without esophagitis 09/01/2014  . Hx of adenomatous colonic polyps 09/01/2014  . Abdominal pain 11/05/2012  . 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 Nausea and vomiting in adult 11/05/2012  . Blood loss anemia 11/05/2012    Past Surgical History:  Procedure Laterality Date  . COLON RESECTION N/A 05/11/2015   Procedure: Laparotomy with resection of Chron's Disease small bowel resection biopsy mesenteric nodule;  Surgeon: Fanny Skates, MD;  Location: [facility] ORS;  Service: General;  Laterality: N/A;  . COLONOSCOPY W/ BIOPSIES  2015  . LAPAROSCOPIC LYSIS OF ADHESIONS  02/2017  . 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 OTHER SURGICAL HISTORY     heel surgery  .  Right Heel surgery     with plates and screws  . wisdom teeth extracted      OB History    No data available       Home Medications    Prior to Admission medications   Medication Sig Start Date End Date Taking? Authorizing Provider  Multiple Vitamins-Minerals (MULTIVITAMIN WITH MINERALS) tablet Take 1 tablet by mouth daily.    [provider]  promethazine (PHENERGAN) 25 MG tablet Take 25 mg by mouth every 6 (six)  hours as needed for nausea or vomiting.    [provider]  temazepam (RESTORIL) 30 MG capsule Take 1 capsule (30 mg total) by mouth at bedtime as needed. for sleep  MUST KEEP 6/20 APPT FOR FURTHER REFILLS 05/21/17   Pyrtle, Lajuan Lines, MD    Family History Family History  Problem Relation Age of Onset  . Colon cancer Maternal Grandmother 31  . Breast cancer Mother 33  . Esophageal cancer Neg Hx   . Stomach cancer Neg Hx   . Rectal cancer Neg Hx   . Pancreatic cancer Neg Hx     Social History Social History  Substance Use Topics  . Smoking status: Never Smoker  . Smokeless tobacco: Never Used  . Alcohol use 0.0 oz/week     Comment: 3 times per week     Allergies   Morphine and related   Review of Systems Review of Systems  Constitutional: Positive for appetite change. Negative for activity change, chills and fever.  HENT: Negative for ear pain and sore throat.   Eyes: Negative for pain and visual disturbance.  Respiratory: Negative for cough and shortness of breath.   Cardiovascular: Negative for chest pain and palpitations.  Gastrointestinal: Positive for abdominal pain, nausea and vomiting. Negative for blood in stool and diarrhea (1 episode of loose stools per day since ostomy closure).  Endocrine: Negative for polydipsia and polyphagia.  Genitourinary: Negative for dysuria and hematuria.  Musculoskeletal: Negative for arthralgias and back pain.  Skin: Negative for color change and rash.  Neurological: Negative for seizures and syncope.  Psychiatric/Behavioral: Negative for agitation and behavioral problems.  All other systems reviewed and are negative.    Physical Exam Updated Vital Signs BP (!) 137/104 (BP Location: Left Arm)   Pulse (!) 118   Temp 98.2 F (36.8 C) (Oral)   Resp 16   Ht 5\' 8"  (1.727 m)   Wt 65.8 kg (145 lb)   SpO2 100%   BMI 22.05 kg/m   Physical Exam  Constitutional: She appears well-developed and well-nourished. No distress.    HENT:  Head: Normocephalic and atraumatic.  Eyes: Conjunctivae are normal.  Neck: Neck supple.  Cardiovascular: Normal rate and regular rhythm.   No murmur heard. Pulmonary/Chest: Effort normal and breath sounds normal. No respiratory distress.  Abdominal: Soft. She exhibits no distension and no mass. There is tenderness (RLQ tender to light palpation).  High Pitched bowel sounds  Musculoskeletal: She exhibits no edema.  Neurological: She is alert.  Skin: Skin is warm and dry.  Psychiatric: She has a normal mood and affect.  Nursing note and vitals reviewed.    ED Treatments / Results  Labs (all labs ordered are listed, but only abnormal results are displayed) Labs Reviewed  COMPREHENSIVE METABOLIC PANEL - Abnormal; Notable for the following:       Result Value   Total Protein 8.3 (*)    All other components within normal limits  LIPASE, BLOOD  CBC  URINALYSIS, ROUTINE W REFLEX MICROSCOPIC    EKG  EKG Interpretation None       Radiology No results found.  Procedures Procedures (including critical care time)  Medications Ordered in ED Medications  sodium chloride 0.9 % bolus 2,000 mL (not administered)  0.9 %  sodium chloride infusion (not administered)  HYDROmorphone (DILAUDID) injection 1 mg (not administered)  promethazine (PHENERGAN) injection 25 mg (not administered)  ondansetron (ZOFRAN-ODT) disintegrating tablet 4 mg (4 mg Oral Given 06/19/17 1534)     Initial Impression / Assessment and Plan / ED Course  I have reviewed the triage vital signs and the nursing notes.  Pertinent labs & imaging results that were available during my care of the patient were reviewed by me and considered in my medical decision making (see chart for details).   Abdominal Pain/Emesis/Nausea  -Starting patient on IV fluids -Adding phenergan in addition to zofran for nausea -IV dilaudid for pain -As patient has had multiple recent CT scans so we will start with Abdominal  X-ray series   DDX: possible crohn's flare, constipation -Abdominal X-ray negative for signs of obstruction or large stool burden -Patient having one large loose bowel movement per day and tolerating liquids well  -Patient stable and has a non-surgical abdomen -Pt will follow up with her GI doctor this week  Final Clinical Impressions(s) / ED Diagnoses   Final diagnoses:  None    New Prescriptions New Prescriptions   No medications on file     Katherine Roan, MD 06/19/17 2028

## 2017-06-19 NOTE — ED Provider Notes (Addendum)
I saw and evaluated the patient, reviewed the resident's note and I agree with the findings and plan.   EKG Interpretation None     46 year old female with history of Crohn's disease presents with 3 days of nausea vomiting and diarrhea. Emesis has been nonbilious. Pain is in her right lower quadrant which is similar to what she's had before in the past. [facility] for [facility] symptoms and had a negative abdominal CT. Today on exam she does have mild right lower quadrant tenderness without rebound or guarding. Labs reviewed and patient will be given IV hydration as well as analgesia and will check plain films and reassess  8:20 PM Patient rechecked and is more colorful. No concern for any acute surgical intervention being required at this time. We'll follow-up with her gastroenterologist this week    Lacretia Leigh, MD 06/19/17 1734    Lacretia Leigh, MD 06/19/17 2020

## 2017-06-19 NOTE — ED Notes (Signed)
Pt ambulatory and independent at discharge.  Verbalized understanding of discharge instructions 

## 2017-06-19 NOTE — ED Notes (Signed)
Patient transported to CT 

## 2017-06-19 NOTE — ED Triage Notes (Signed)
Patient c/o generalized abdominal pain with N/V/D x3 days. Hx chron's.

## 2017-06-19 NOTE — ED Notes (Signed)
Stuck patient twice for IV access, both attempts unsuccessful.

## 2017-06-21 ENCOUNTER — Ambulatory Visit: Payer: 59 | Admitting: Internal Medicine

## 2017-06-21 ENCOUNTER — Telehealth: Payer: Self-pay | Admitting: Internal Medicine

## 2017-06-21 MED ORDER — TEMAZEPAM 30 MG PO CAPS: 30.0000 mg | ORAL_CAPSULE | Freq: Every evening | ORAL | 0 refills | Status: DC | PRN

## 2017-06-21 NOTE — Telephone Encounter (Signed)
Patient advised that she needs to reschedule appointment with Dr Chase Caller prior to starting her Humira as previously advised by Dr Hilarie Fredrickson. Patient has been advised that I will give 1 additional month of temazepam at this time. She verbalizes understanding.

## 2017-06-21 NOTE — Telephone Encounter (Signed)
cxr and labs clear - the pneumonia from 3 years ago is cleared up on cxr. However Dr Hilarie Fredrickson wants to evaluate me for fitness for new biologics. This would require face to face  Dr. Brand Males, M.D., [facility].C.P Pulmonary and Critical Care Medicine Staff Physician [place] [facility] Pager: [phone], If no answer or between  15:00h - 7:00h: call [phone]  06/21/2017 5:25 PM       PULMONARY No results for input(s): PHART, PCO2ART, PO2ART, HCO3, TCO2, O2SAT in the last 168 hours.  Invalid input(s): PCO2, PO2  CBC  Recent Labs Lab 06/19/17 1536  HGB 13.3  HCT 39.3  WBC 6.1  PLT 328    COAGULATION No results for input(s): INR in the last 168 hours.  CARDIAC  No results for input(s): TROPONINI in the last 168 hours. No results for input(s): PROBNP in the last 168 hours.   CHEMISTRY  Recent Labs Lab 06/19/17 1536  NA 139  K 4.3  CL 106  CO2 24  GLUCOSE 89  BUN 16  CREATININE 0.77  CALCIUM 9.7   Estimated Creatinine Clearance: 89.6 mL/min (by C-G formula based on SCr of 0.77 mg/dL).   LIVER  Recent Labs Lab 06/19/17 1536  AST 20  ALT 18  ALKPHOS 61  BILITOT 0.5  PROT 8.3*  ALBUMIN 4.4     INFECTIOUS No results for input(s): LATICACIDVEN, PROCALCITON in the last 168 hours.   ENDOCRINE CBG (last 3)  No results for input(s): GLUCAP in the last 72 hours.       IMAGING x48h  - image(s) personally visualized  -   highlighted in bold Dg Abd Acute W/chest  Result Date: 06/19/2017 CLINICAL DATA:  History of Crohn's with new onset right lower quadrant pain with nausea and vomiting x4 days. EXAM: DG ABDOMEN ACUTE W/ 1V CHEST COMPARISON:  None. FINDINGS: There is no evidence of dilated bowel loops or free intraperitoneal air. No radiopaque calculi or other significant radiographic abnormality is seen. Heart size and mediastinal contours are within normal limits. Both lungs are clear. There is mild apical  pleuroparenchymal scarring suggested at the right lung apex. IMPRESSION: Negative abdominal radiographs.  No acute cardiopulmonary disease. Electronically Signed   By: Ashley Royalty M.D.   On: 06/19/2017 18:56

## 2017-06-21 NOTE — Telephone Encounter (Signed)
Left voicemail for patient to call back. She cancelled her appointment with Dr Chase Caller and needs to reschedule that appointment.

## 2017-06-21 NOTE — Telephone Encounter (Signed)
lmtcb X1 for pt  

## 2017-06-21 NOTE — Telephone Encounter (Signed)
MR  Please Advise-  Pt called in and canceled her ov visit with you today. Pt states she was seen in the ED on 06/19/17. She wanted you to take a look at the notes as well as the xray scan she had done.

## 2017-06-22 ENCOUNTER — Telehealth: Payer: Self-pay | Admitting: Internal Medicine

## 2017-06-22 NOTE — Telephone Encounter (Signed)
Pt aware of results and MR's recommendations. Pt is scheduled for OV with MR on 08/10/17. Pt states she will keep this scheduled apt.  Nothing further needed.

## 2017-06-22 NOTE — Telephone Encounter (Signed)
lmtcb x1 for pt. 

## 2017-06-22 NOTE — Telephone Encounter (Signed)
Duplicate message see the message from 7/5

## 2017-06-22 NOTE — Telephone Encounter (Signed)
Pt returned phone call , can contact at work [phone]...ert

## 2017-06-22 NOTE — Telephone Encounter (Signed)
Pt returning call and can be reached @ [phone].Meghan Charles

## 2017-07-03 ENCOUNTER — Emergency Department (HOSPITAL_COMMUNITY)
Admission: EM | Admit: 2017-07-03 | Discharge: 2017-07-03 | Disposition: A | Discharge status: Home / Self Care | Payer: 59 | Attending: Emergency Medicine | Admitting: Emergency Medicine

## 2017-07-03 ENCOUNTER — Encounter (HOSPITAL_COMMUNITY): Payer: Self-pay | Admitting: Emergency Medicine

## 2017-07-03 ENCOUNTER — Emergency Department (HOSPITAL_COMMUNITY): Payer: 59

## 2017-07-03 DIAGNOSIS — I10 Essential (primary) hypertension: Secondary | ICD-10-CM | POA: Insufficient documentation

## 2017-07-03 DIAGNOSIS — Z79899 Other long term (current) drug therapy: Secondary | ICD-10-CM | POA: Insufficient documentation

## 2017-07-03 DIAGNOSIS — K5090 Crohn's disease, unspecified, without complications: Secondary | ICD-10-CM

## 2017-07-03 DIAGNOSIS — R112 Nausea with vomiting, unspecified: Secondary | ICD-10-CM | POA: Diagnosis present

## 2017-07-03 LAB — URINALYSIS, ROUTINE W REFLEX MICROSCOPIC
Bilirubin Urine: NEGATIVE
GLUCOSE, UA: NEGATIVE mg/dL
Hgb urine dipstick: NEGATIVE
KETONES UR: 5 mg/dL — AB
Leukocytes, UA: NEGATIVE
Nitrite: NEGATIVE
PH: 5 (ref 5.0–8.0)
Protein, ur: 30 mg/dL — AB
SPECIFIC GRAVITY, URINE: 1.032 — AB (ref 1.005–1.030)

## 2017-07-03 LAB — CBC
HCT: 40.7 % (ref 36.0–46.0)
Hemoglobin: 13.9 g/dL (ref 12.0–15.0)
MCH: 30.6 pg (ref 26.0–34.0)
MCHC: 34.2 g/dL (ref 30.0–36.0)
MCV: 89.6 fL (ref 78.0–100.0)
PLATELETS: 310 10*3/uL (ref 150–400)
RBC: 4.54 MIL/uL (ref 3.87–5.11)
RDW: 13 % (ref 11.5–15.5)
WBC: 9.9 10*3/uL (ref 4.0–10.5)

## 2017-07-03 LAB — COMPREHENSIVE METABOLIC PANEL
ALK PHOS: 56 U/L (ref 38–126)
ALT: 19 U/L (ref 14–54)
AST: 20 U/L (ref 15–41)
Albumin: 4.7 g/dL (ref 3.5–5.0)
Anion gap: 11 (ref 5–15)
BUN: 22 mg/dL — AB (ref 6–20)
CALCIUM: 9.2 mg/dL (ref 8.9–10.3)
CO2: 24 mmol/L (ref 22–32)
CREATININE: 0.82 mg/dL (ref 0.44–1.00)
Chloride: 106 mmol/L (ref 101–111)
Glucose, Bld: 109 mg/dL — ABNORMAL HIGH (ref 65–99)
Potassium: 4 mmol/L (ref 3.5–5.1)
Sodium: 141 mmol/L (ref 135–145)
Total Bilirubin: 0.4 mg/dL (ref 0.3–1.2)
Total Protein: 8.5 g/dL — ABNORMAL HIGH (ref 6.5–8.1)

## 2017-07-03 LAB — LIPASE, BLOOD: Lipase: 32 U/L (ref 11–51)

## 2017-07-03 MED ORDER — HYDROMORPHONE HCL 1 MG/ML IJ SOLN
0.5000 mg | Freq: Once | INTRAMUSCULAR | Status: AC
  Administered 2017-07-03: 0.5 mg via INTRAVENOUS
  Filled 2017-07-03: qty 1

## 2017-07-03 MED ORDER — PROMETHAZINE HCL 25 MG PO TABS: 25.0000 mg | ORAL_TABLET | Freq: Four times a day (QID) | ORAL | 0 refills | Status: DC | PRN

## 2017-07-03 MED ORDER — DEXAMETHASONE SODIUM PHOSPHATE 10 MG/ML IJ SOLN
10.0000 mg | Freq: Once | INTRAMUSCULAR | Status: AC
  Administered 2017-07-03: 10 mg via INTRAVENOUS
  Filled 2017-07-03: qty 1

## 2017-07-03 MED ORDER — ONDANSETRON 4 MG PO TBDP
4.0000 mg | ORAL_TABLET | Freq: Once | ORAL | Status: AC | PRN
  Administered 2017-07-03: 4 mg via ORAL
  Filled 2017-07-03: qty 1

## 2017-07-03 MED ORDER — PROMETHAZINE HCL 25 MG/ML IJ SOLN
12.5000 mg | Freq: Once | INTRAMUSCULAR | Status: AC
  Administered 2017-07-03: 12.5 mg via INTRAVENOUS
  Filled 2017-07-03: qty 1

## 2017-07-03 MED ORDER — SODIUM CHLORIDE 0.9 % IV BOLUS (SEPSIS)
1000.0000 mL | Freq: Once | INTRAVENOUS | Status: AC
  Administered 2017-07-03: 1000 mL via INTRAVENOUS

## 2017-07-03 MED ORDER — OXYCODONE-ACETAMINOPHEN 5-325 MG PO TABS: 1.0000 | ORAL_TABLET | Freq: Four times a day (QID) | ORAL | 0 refills | Status: DC | PRN

## 2017-07-03 NOTE — Discharge Instructions (Signed)
Please read instructions below. You can take 1-2 tabs of Percocet every 6 hours as needed for severe pain. Do not take Tylenol, drink alcohol, or drive while taking this medication. You can take Phenergan every 6 hours as needed for nausea. Slowly reintroduce solids into your diet, starting with bland foods. Schedule an appointment with your gastroenterologist for follow-up. Return to the ER for new or worsening symptoms.

## 2017-07-03 NOTE — ED Triage Notes (Signed)
patient c/o of her crohn's flare up since Sunday with n/v/d. Pain is more on right lower side of abd.

## 2017-07-03 NOTE — ED Notes (Signed)
Patient transported to X-ray 

## 2017-07-03 NOTE — ED Provider Notes (Signed)
[facility] DEPT Provider Note   CSN: [id number] Arrival date & time: 07/03/17  1032     History   Chief Complaint Chief Complaint  Patient presents with  . Abdominal Pain  . Emesis    HPI Meghan Charles is a 46 y.o. female with past medical history of Crohn's disease and partial ileal resection with ileostomy that was closed in March of this year.  HPI Patient presents with gradually worsening right-sided abdominal pain and on Sunday. Describes it as a pulling, throbbing pain that is 7/10. Patient states her abdominal pain is accompanied by nausea, vomiting, diarrhea. She reports this feels similar to her Crohn's flares in regards to location and quality of pain. She states she is currently in the process with her gastroenterologist of starting Humira for chronic management. Her partner last BM was this morning. States she had no relief of nausea with Zofran that was given in triage. Denies associated fever, chills, urinary symptoms, hematochezia or melena, vaginal bleeding or discharge.    Past Medical History:  Diagnosis Date  . Anxiety   . Colon obstruction ([place])   . Colon polyps 05/2014   TUBULAR ADENOMA AND HYPERPLASTIC POLYP.  . Crohn's disease ([place])   . Crohn's disease of small intestine with complication ([place])   . Gallbladder polyp   . Tubular adenoma of colon     Patient Active Problem List   Diagnosis Date Noted  . Hypokalemia   . 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 RLQ abdominal pain   . Pollen allergies 03/26/2015  . Immunosuppressed status ([place]) 03/26/2015  . Exacerbation of Crohn's disease ([place])   . High risk medication use   . Diarrhea 01/18/2015  . Crohn's colitis ([place]) 12/26/2014  . Essential hypertension   . CAP (community acquired pneumonia) 10/02/2014  . Tachycardia 10/02/2014  . Physical deconditioning 10/02/2014  . PNA (pneumonia) 09/24/2014  . Pleural effusion 09/24/2014  . Nausea with vomiting 09/24/2014  . Leukocytosis 09/24/2014  . Sinus  tachycardia 09/24/2014  . Dehydration 09/24/2014  . Gastroesophageal reflux disease without esophagitis 09/01/2014  . Hx of adenomatous colonic polyps 09/01/2014  . Abdominal pain 11/05/2012  . 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 Nausea and vomiting in adult 11/05/2012  . Blood loss anemia 11/05/2012    Past Surgical History:  Procedure Laterality Date  . COLON RESECTION N/A 05/11/2015   Procedure: Laparotomy with resection of Chron's Disease small bowel resection biopsy mesenteric nodule;  Surgeon: Fanny Skates, MD;  Location: [facility];  Service: General;  Laterality: N/A;  . COLONOSCOPY W/ BIOPSIES  2015  . LAPAROSCOPIC LYSIS OF ADHESIONS  02/2017  . 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 OTHER SURGICAL HISTORY     heel surgery  . Right Heel surgery     with plates and screws  . wisdom teeth extracted      OB History    No data available       Home Medications    Prior to Admission medications   Medication Sig Start Date End Date Taking? Authorizing Provider  acetaminophen (TYLENOL) 500 MG tablet Take 1,000 mg by mouth every 6 (six) hours as needed (pain).   Yes [provider]  medroxyPROGESTERone (DEPO-PROVERA) 150 MG/ML injection Inject 150 mg into the muscle every 3 (three) months.   Yes [provider]  temazepam (RESTORIL) 30 MG capsule Take 1 capsule (30 mg total) by mouth at bedtime as needed. for sleep 06/21/17  Yes Pyrtle, Lajuan Lines, MD  oxyCODONE-acetaminophen (PERCOCET/ROXICET) [phone]  MG tablet Take 1-2 tablets by mouth every 6 (six) hours as needed for severe pain. 07/03/17   Russo, [place] N, PA-C  promethazine (PHENERGAN) 25 MG tablet Take 1 tablet (25 mg total) by mouth every 6 (six) hours as needed for nausea or vomiting. 07/03/17   Russo, [place] N, PA-C    Family History Family History  Problem Relation Age of Onset  . Colon cancer Maternal Grandmother 28  . Breast cancer Mother 10  . Esophageal cancer Neg Hx   . Stomach cancer Neg Hx   .  Rectal cancer Neg Hx   . Pancreatic cancer Neg Hx     Social History Social History  Substance Use Topics  . Smoking status: Never Smoker  . Smokeless tobacco: Never Used  . Alcohol use 0.0 oz/week     Comment: 3 times per week     Allergies   Morphine and related   Review of Systems Review of Systems  Constitutional: Negative for chills and fever.  HENT: Negative for sore throat.   Respiratory: Negative for shortness of breath.   Cardiovascular: Negative for chest pain.  Gastrointestinal: Positive for abdominal pain (Right-sided), diarrhea, nausea and vomiting. Negative for blood in stool and constipation.  Genitourinary: Negative for dysuria, vaginal bleeding and vaginal discharge.  Musculoskeletal: Negative for back pain.  All other systems reviewed and are negative.    Physical Exam Updated Vital Signs BP (!) 128/91   Pulse 87   Temp 98.7 F (37.1 C) (Oral)   Resp 16   Ht 5' 8"  (1.727 m)   Wt 66.2 kg (146 lb)   SpO2 98%   BMI 22.20 kg/m   Physical Exam  Constitutional: She appears well-developed and well-nourished. No distress.  HENT:  Head: Normocephalic and atraumatic.  Mouth/Throat: Oropharynx is clear and moist.  Eyes: Conjunctivae are normal.  Neck: Normal range of motion. Neck supple.  Cardiovascular: Normal rate, regular rhythm, normal heart sounds and intact distal pulses.   Pulmonary/Chest: Effort normal and breath sounds normal. No respiratory distress. She has no wheezes. She has no rales.  Abdominal: Soft. She exhibits no distension and no mass. Bowel sounds are increased. There is tenderness (Diffuse right-sided abdominal tenderness.). There is no rebound, no guarding and no CVA tenderness. No hernia.  Neurological: She is alert.  Skin: Skin is warm.  Psychiatric: She has a normal mood and affect. Her behavior is normal.  Nursing note and vitals reviewed.    ED Treatments / Results  Labs (all labs ordered are listed, but only abnormal  results are displayed) Labs Reviewed  COMPREHENSIVE METABOLIC PANEL - Abnormal; Notable for the following:       Result Value   Glucose, Bld 109 (*)    BUN 22 (*)    Total Protein 8.5 (*)    All other components within normal limits  URINALYSIS, ROUTINE W REFLEX MICROSCOPIC - Abnormal; Notable for the following:    APPearance HAZY (*)    Specific Gravity, Urine 1.032 (*)    Ketones, ur 5 (*)    Protein, ur 30 (*)    Bacteria, UA FEW (*)    Squamous Epithelial / LPF 0-5 (*)    All other components within normal limits  LIPASE, BLOOD  CBC    EKG  EKG Interpretation None       Radiology Dg Abdomen Acute W/chest  Result Date: 07/03/2017 CLINICAL DATA:  Crohn's flare up. Abdominal pain. Nausea and diarrhea. EXAM: DG ABDOMEN ACUTE W/ 1V CHEST  COMPARISON:  06/19/2017. FINDINGS: Chest x-ray reveals chronic pleural-parenchymal thickening consistent with scarring. Stable nodularity in the upper lungs. Heart size stable. Soft tissues the abdomen are unremarkable. Several air-filled loops of small bowel with slightly thickened folds appear to be present. No evidence of bowel obstruction. No free air. Pelvic calcifications consistent phleboliths . IMPRESSION: Several air-filled loops of small bowel with thickened folds noted. Findings consistent with active small bowel disease including Crohn's disease. No evidence of bowel obstruction. No free air. Electronically Signed   By: Marcello Moores  Register   On: 07/03/2017 14:20    Procedures Procedures (including critical care time)  Medications Ordered in ED Medications  ondansetron (ZOFRAN-ODT) disintegrating tablet 4 mg (4 mg Oral Given 07/03/17 1057)  sodium chloride 0.9 % bolus 1,000 mL (0 mLs Intravenous Stopped 07/03/17 1405)  HYDROmorphone (DILAUDID) injection 0.5 mg (0.5 mg Intravenous Given 07/03/17 1245)  promethazine (PHENERGAN) injection 12.5 mg (12.5 mg Intravenous Given 07/03/17 1245)  dexamethasone (DECADRON) injection 10 mg (10 mg  Intravenous Given 07/03/17 1510)  HYDROmorphone (DILAUDID) injection 0.5 mg (0.5 mg Intravenous Given 07/03/17 1510)     Initial Impression / Assessment and Plan / ED Course  I have reviewed the triage vital signs and the nursing notes.  Pertinent labs & imaging results that were available during my care of the patient were reviewed by me and considered in my medical decision making (see chart for details).     Pt w right-sided abdominal pain, consistent with Crohn's disease exacerbation. Given patient's repeated radiation exposure, x-ray done. Acute abdominal series consistent with diagnosis, no evidence of bowel obstruction or perforation. Patient is nontoxic, nonseptic appearing, in no apparent distress.  Patient's pain and other symptoms adequately managed in emergency department.  Fluid bolus given. IV decadron given. Labs, imaging and vitals reviewed.  Patient does not meet the SIRS or Sepsis criteria.  On repeat exam patient does not have a surgical abdomen and there are no peritoneal signs.  No indication of appendicitis, bowel obstruction, bowel perforation, cholecystitis, diverticulitis, PID or ectopic pregnancy.  Patient discharged home with symptomatic treatment and given strict instructions for follow-up with their gastroenterologist.  Pt safe for discharge.  Patient discussed with and seen by Dr. Lacinda Axon.  [place] Controlled Substance reporting System queried  Discussed results, findings, treatment and follow up. Patient advised of return precautions. Patient verbalized understanding and agreed with plan.   Final Clinical Impressions(s) / ED Diagnoses   Final diagnoses:  Exacerbation of Crohn's disease without complication ([place])    New Prescriptions Discharge Medication List as of 07/03/2017  3:25 PM    START taking these medications   Details  oxyCODONE-acetaminophen (PERCOCET/ROXICET) 5-325 MG tablet Take 1-2 tablets by mouth every 6 (six) hours as needed for severe  pain., Starting Tue 07/03/2017, Print    promethazine (PHENERGAN) 25 MG tablet Take 1 tablet (25 mg total) by mouth every 6 (six) hours as needed for nausea or vomiting., Starting Tue 07/03/2017, Print         Russo, [place] N, PA-C 07/03/17 1601    Russo, [place] N, PA-C 07/03/17 9166    Nat Christen, MD 07/04/17 [phone]

## 2017-07-18 ENCOUNTER — Telehealth: Payer: Self-pay | Admitting: Internal Medicine

## 2017-07-18 NOTE — Telephone Encounter (Signed)
Spoke with pt and she will bring in a copy of her new insurance card. Copy of card [facility].

## 2017-08-10 ENCOUNTER — Ambulatory Visit (INDEPENDENT_AMBULATORY_CARE_PROVIDER_SITE_OTHER): Payer: 59 | Admitting: Internal Medicine

## 2017-08-10 ENCOUNTER — Other Ambulatory Visit: Payer: 59

## 2017-08-10 ENCOUNTER — Encounter: Payer: Self-pay | Admitting: Internal Medicine

## 2017-08-10 VITALS — BP 138/74 | HR 75 | Ht 68.0 in | Wt 151.4 lb

## 2017-08-10 DIAGNOSIS — Z79899 Other long term (current) drug therapy: Secondary | ICD-10-CM

## 2017-08-10 DIAGNOSIS — Z5181 Encounter for therapeutic drug level monitoring: Secondary | ICD-10-CM | POA: Diagnosis not present

## 2017-08-10 DIAGNOSIS — D899 Disorder involving the immune mechanism, unspecified: Secondary | ICD-10-CM

## 2017-08-10 DIAGNOSIS — D849 Immunodeficiency, unspecified: Secondary | ICD-10-CM

## 2017-08-10 DIAGNOSIS — Z79620 Long term (current) use of immunosuppressive biologic: Secondary | ICD-10-CM

## 2017-08-10 DIAGNOSIS — Z23 Encounter for immunization: Secondary | ICD-10-CM

## 2017-08-10 NOTE — Patient Instructions (Addendum)
ICD-10-CM   1. Encounter for therapeutic drug monitoring Z51.81   2. Adalimumab (Humira) long-term use Z79.899   3. Immunosuppressed status ([place]) D89.9    Glad you are doing well Yes there is risk for pneumonia and other oportunistic infection with Humira At this point is  A risk v benefit thing and so ok for  humira However, PLAN  - flu shot 08/10/2017 - Prevnar vaccine 08/10/2017 - glad you completed INH prophylaxis for Latent TB - check Immunoglobulin level  IgG, IgA, IgM, IgE - Please talk to PCP Patient, No Pcp Per -  and ensure you go through safety of shingarix vaccine and get it asap if no exclusions  Followup 3 months or sooner if needed

## 2017-08-10 NOTE — Addendum Note (Signed)
Addended by: Collier Salina on: 08/10/2017 12:56 PM   Modules accepted: Orders

## 2017-08-10 NOTE — Progress Notes (Signed)
Subjective:     Patient ID: Meghan Charles, female   DOB: 08-08-1971, 46 y.o.   MRN: [id number]  HPI   OV 03/26/2015  Chief Complaint  Patient presents with  . Acute Visit    Pt c/o prod cough pt unsure of color, slight increase in SOB, sinus pressure, PND x 5 days. Pt denies CP/tightness.   Immunosuppressed patient because of Crohn's disease currently undergoing monoclonal antibody infusion every 8 weeks and Imuran. She had adverse reaction to TNF alpha blockade with severe pneumonia several months ago. Overall she's been doing well since I last saw him in February 2016. 6 days ago went to the [facility] but has been having a flare up of her usual spring allergies. Somewhat has been difficult to control despite Flonase MediPort and antihistamine systemic medication. However 3 days ago had low-grade fever and since then has had more tiredness rated as moderate severity, worsening cough with sinus drainage feels infected. She does not know the color of the sinus drainage. No further fever but does have a subjective sensation of feeling warm. No wheezing or shortness of breath or chest pain or orthopnea paroxysmal nocturnal dyspnea   OV. 08/10/2017  Chief Complaint  Patient presents with  . Follow-up    Pt referred by Dr. Hilarie Fredrickson because pt is about to start Humira. Pt denies any SOB, cough, CP/tightness, f/c/s.    Follow-up Crohn's disease with complication the course of significant pneumonia with Remicade in 2016. Subsequently on Entyvio. I last saw her in April 2016. At that time a QuantiFERON gold test was negative. Currently she is here for reestablishing care. This is because her Crohn's disease is under active state. She has had colectomy in the interim. She now requires to go back on TNF alpha blockade. Humira is being considered. She is here for clearance. Overall she's feeling well other than the Crohn's disease. There is no respiratory complaints of shortness of breath cough and wheezing chest  tightness respiratory infection or orthopnea paroxysmal nocturnal dyspnea or edema or chest pain. There is no fevers or weight loss or chills. Her Humira will start at any time we give yes.  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 She tells me that she has had INH prophylaxis. She is due for flu vaccine 04/06/2017-2019 season. She is only had Pneumovax and could have Prevnar. She has not had shingles vaccine (new one is inactivated one)     has a past medical history of Anxiety; Colon obstruction ([place]); Colon polyps (05/2014); Crohn's disease ([place]); Crohn's disease of small intestine with complication ([place]); Gallbladder polyp; and Tubular adenoma of colon.   reports that she has never smoked. She has never used smokeless tobacco.  Past Surgical History:  Procedure Laterality Date  . COLON RESECTION N/A 05/11/2015   Procedure: Laparotomy with resection of Chron's Disease small bowel resection biopsy mesenteric nodule;  Surgeon: Fanny Skates, MD;  Location: [facility];  Service: General;  Laterality: N/A;  . COLONOSCOPY W/ BIOPSIES  2015  . LAPAROSCOPIC LYSIS OF ADHESIONS  02/2017  . 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 OTHER SURGICAL HISTORY     heel surgery  . Right Heel surgery     with plates and screws  . wisdom teeth extracted      Allergies  Allergen Reactions  . Morphine And Related Other (See Comments)    Headache    Immunization History  Administered Date(s) Administered  . Hep A / Hep B  07/06/2014, 08/06/2014  . Influenza,inj,Quad PF,6+ Mos 02/22/2017  . Pneumococcal Polysaccharide-23 07/01/2014    Family History  Problem Relation Age of Onset  . Colon cancer Maternal Grandmother 9  . Breast cancer Mother 75  . Esophageal cancer Neg Hx   . Stomach cancer Neg Hx   . Rectal cancer Neg Hx   . Pancreatic cancer Neg Hx      Current Outpatient Prescriptions:  .  acetaminophen (TYLENOL) 500 MG tablet, Take 1,000 mg by mouth every 6 (six)  hours as needed (pain)., Disp: , Rfl:  .  medroxyPROGESTERone (DEPO-PROVERA) 150 MG/ML injection, Inject 150 mg into the muscle every 3 (three) months., Disp: , Rfl:  .  temazepam (RESTORIL) 30 MG capsule, Take 1 capsule (30 mg total) by mouth at bedtime as needed. for sleep (Patient not taking: Reported on 08/10/2017), Disp: 30 capsule, Rfl: 0    Review of Systems     Objective:   Physical Exam  Constitutional: She is oriented to person, place, and time. She appears well-developed and well-nourished. No distress.  HENT:  Head: Normocephalic and atraumatic.  Right Ear: External ear normal.  Left Ear: External ear normal.  Mouth/Throat: Oropharynx is clear and moist. No oropharyngeal exudate.  Eyes: Pupils are equal, round, and reactive to light. Conjunctivae and EOM are normal. Right eye exhibits no discharge. Left eye exhibits no discharge. No scleral icterus.  Neck: Normal range of motion. Neck supple. No JVD present. No tracheal deviation present. No thyromegaly present.  Cardiovascular: Normal rate, regular rhythm, normal heart sounds and intact distal pulses.  Exam reveals no gallop and no friction rub.   No murmur heard. Pulmonary/Chest: Effort normal and breath sounds normal. No respiratory distress. She has no wheezes. She has no rales. She exhibits no tenderness.  Abdominal: Soft. Bowel sounds are normal. She exhibits no distension and no mass. There is no tenderness. There is no rebound and no guarding.  Musculoskeletal: Normal range of motion. She exhibits no edema or tenderness.  Lymphadenopathy:    She has no cervical adenopathy.  Neurological: She is alert and oriented to person, place, and time. She has normal reflexes. No cranial nerve deficit. She exhibits normal muscle tone. Coordination normal.  Skin: Skin is warm and dry. No rash noted. She is not diaphoretic. No erythema. No pallor.  Psychiatric: She has a normal mood and affect. Her behavior is normal. Judgment and  thought content normal.  Vitals reviewed.   Vitals:   08/10/17 1143  BP: 138/74  Pulse: 75  SpO2: 95%  Weight: 151 lb 6.4 oz (68.7 kg)  Height: 5' 8"  (1.727 m)        Assessment:       ICD-10-CM   1. Encounter for therapeutic drug monitoring Z51.81   2. Adalimumab (Humira) long-term use Z79.899   3. Immunosuppressed status ([place]) D89.9        Plan:      Glad you are doing well Yes there is risk for pneumonia and other oportunistic infection with Humira At this point is  A risk v benefit thing and so ok for  humira However, PLAN  - flu shot 08/10/2017 - Prevnar vaccine 08/10/2017 - glad you completed INH prophylaxis for Latent TB - check Immunoglobulin level  IgG, IgA, IgM, IgE - Please talk to PCP Patient, No Pcp Per -  and ensure you go through safety of shingarix vaccine and get it asap if no exclusions  Followup 3 months or sooner if needed  >  50% of this > 25 min visit spent in face to face counseling or coordination of care    Dr. Brand Males, M.D., [facility].C.P Pulmonary and Critical Care Medicine Staff Physician [place] [facility] Pager: [phone], If no answer or between  15:00h - 7:00h: call [phone]  08/10/2017 12:12 PM

## 2017-08-13 LAB — IGG, IGA, IGM
IGG (IMMUNOGLOBIN G), SERUM: 1155 mg/dL (ref 694–1618)
IgA: 170 mg/dL (ref 81–463)
IgM, Serum: 197 mg/dL (ref 48–271)

## 2017-08-13 LAB — IGE: IGE (IMMUNOGLOBULIN E), SERUM: 111 kU/L (ref <115)

## 2017-08-14 ENCOUNTER — Telehealth: Payer: Self-pay | Admitting: Internal Medicine

## 2017-08-14 NOTE — Telephone Encounter (Signed)
Let Meghan Charles  Know that Immunoglobulin levels are normal  Dr. Brand Males, M.D., [facility].C.P Pulmonary and Critical Care Medicine Staff Physician [place] [facility] Pager: [phone], If no answer or between  15:00h - 7:00h: call [phone]  08/14/2017 1:30 AM

## 2017-08-17 ENCOUNTER — Encounter: Payer: Self-pay | Admitting: Internal Medicine

## 2017-08-17 NOTE — Telephone Encounter (Signed)
Called and spoke to pt. Informed her of the results per MR. Pt verbalized understanding and requested we send results and OV notes to Dr. Hilarie Fredrickson, this has been done. Pt verbalized understanding and denied any further questions or concerns at this time.

## 2017-08-21 ENCOUNTER — Telehealth: Payer: Self-pay | Admitting: Internal Medicine

## 2017-08-21 NOTE — Telephone Encounter (Signed)
Pt states she saw Pulmonary 08/10/17 and wants to know if she can start her Humira. Pt also requesting refill on Restoril. Please advise.

## 2017-08-21 NOTE — Telephone Encounter (Signed)
Left message for pt to call back  °

## 2017-08-21 NOTE — Telephone Encounter (Signed)
Pt said her insurance is in the system for her Humira and she wants to know if she can start treatment.   She needs a refill for her "sleep med"

## 2017-08-22 ENCOUNTER — Encounter (HOSPITAL_COMMUNITY): Payer: Self-pay | Admitting: Emergency Medicine

## 2017-08-22 ENCOUNTER — Emergency Department (HOSPITAL_COMMUNITY)
Admission: EM | Admit: 2017-08-22 | Discharge: 2017-08-22 | Disposition: A | Discharge status: Home / Self Care | Payer: 59 | Attending: Emergency Medicine | Admitting: Emergency Medicine

## 2017-08-22 DIAGNOSIS — R112 Nausea with vomiting, unspecified: Secondary | ICD-10-CM | POA: Insufficient documentation

## 2017-08-22 DIAGNOSIS — R1033 Periumbilical pain: Secondary | ICD-10-CM | POA: Diagnosis present

## 2017-08-22 DIAGNOSIS — R42 Dizziness and giddiness: Secondary | ICD-10-CM | POA: Diagnosis not present

## 2017-08-22 DIAGNOSIS — I10 Essential (primary) hypertension: Secondary | ICD-10-CM | POA: Diagnosis not present

## 2017-08-22 DIAGNOSIS — Z79899 Other long term (current) drug therapy: Secondary | ICD-10-CM | POA: Diagnosis not present

## 2017-08-22 DIAGNOSIS — R109 Unspecified abdominal pain: Secondary | ICD-10-CM

## 2017-08-22 LAB — COMPREHENSIVE METABOLIC PANEL
ALT: 21 U/L (ref 14–54)
AST: 19 U/L (ref 15–41)
Albumin: 5 g/dL (ref 3.5–5.0)
Alkaline Phosphatase: 65 U/L (ref 38–126)
Anion gap: 9 (ref 5–15)
BUN: 20 mg/dL (ref 6–20)
CO2: 25 mmol/L (ref 22–32)
Calcium: 9.7 mg/dL (ref 8.9–10.3)
Chloride: 107 mmol/L (ref 101–111)
Creatinine, Ser: 0.81 mg/dL (ref 0.44–1.00)
GFR calc Af Amer: >60 mL/min (ref >60)
GFR calc non Af Amer: >60 mL/min (ref >60)
Glucose, Bld: 90 mg/dL (ref 65–99)
Potassium: 4.3 mmol/L (ref 3.5–5.1)
Sodium: 141 mmol/L (ref 135–145)
Total Bilirubin: 0.5 mg/dL (ref 0.3–1.2)
Total Protein: 9.2 g/dL — ABNORMAL HIGH (ref 6.5–8.1)

## 2017-08-22 LAB — URINALYSIS, ROUTINE W REFLEX MICROSCOPIC
Bilirubin Urine: NEGATIVE
Glucose, UA: NEGATIVE mg/dL
Ketones, ur: 20 mg/dL — AB
Nitrite: NEGATIVE
Protein, ur: NEGATIVE mg/dL
Specific Gravity, Urine: 1.028 (ref 1.005–1.030)
pH: 5 (ref 5.0–8.0)

## 2017-08-22 LAB — CBC WITH DIFFERENTIAL/PLATELET
Basophils Absolute: 0 10*3/uL (ref 0.0–0.1)
Basophils Relative: 1 %
Eosinophils Absolute: 0 10*3/uL (ref 0.0–0.7)
Eosinophils Relative: 1 %
HCT: 43 % (ref 36.0–46.0)
Hemoglobin: 14.9 g/dL (ref 12.0–15.0)
Lymphocytes Relative: 30 %
Lymphs Abs: 1.8 10*3/uL (ref 0.7–4.0)
MCH: 30.4 pg (ref 26.0–34.0)
MCHC: 34.7 g/dL (ref 30.0–36.0)
MCV: 87.8 fL (ref 78.0–100.0)
Monocytes Absolute: 0.3 10*3/uL (ref 0.1–1.0)
Monocytes Relative: 5 %
Neutro Abs: 3.9 10*3/uL (ref 1.7–7.7)
Neutrophils Relative %: 63 %
Platelets: 312 10*3/uL (ref 150–400)
RBC: 4.9 MIL/uL (ref 3.87–5.11)
RDW: 12.3 % (ref 11.5–15.5)
WBC: 6.2 10*3/uL (ref 4.0–10.5)

## 2017-08-22 LAB — LIPASE, BLOOD: Lipase: 29 U/L (ref 11–51)

## 2017-08-22 MED ORDER — PROMETHAZINE HCL 25 MG/ML IJ SOLN
12.5000 mg | Freq: Once | INTRAMUSCULAR | Status: AC
  Administered 2017-08-22: 12.5 mg via INTRAVENOUS
  Filled 2017-08-22: qty 1

## 2017-08-22 MED ORDER — HYDROCODONE-ACETAMINOPHEN 5-325 MG PO TABS: 1.0000 | ORAL_TABLET | ORAL | 0 refills | Status: DC | PRN

## 2017-08-22 MED ORDER — SODIUM CHLORIDE 0.9 % IV BOLUS (SEPSIS)
1000.0000 mL | Freq: Once | INTRAVENOUS | Status: AC
  Administered 2017-08-22: 1000 mL via INTRAVENOUS

## 2017-08-22 MED ORDER — PROMETHAZINE HCL 25 MG PO TABS: 25.0000 mg | ORAL_TABLET | Freq: Four times a day (QID) | ORAL | 0 refills | Status: DC | PRN

## 2017-08-22 MED ORDER — ONDANSETRON HCL 4 MG/2ML IJ SOLN
4.0000 mg | Freq: Once | INTRAMUSCULAR | Status: AC
  Administered 2017-08-22: 4 mg via INTRAVENOUS
  Filled 2017-08-22: qty 2

## 2017-08-22 MED ORDER — HYDROMORPHONE HCL 1 MG/ML IJ SOLN
1.0000 mg | Freq: Once | INTRAMUSCULAR | Status: AC
  Administered 2017-08-22: 1 mg via INTRAVENOUS
  Filled 2017-08-22: qty 1

## 2017-08-22 NOTE — Discharge Instructions (Signed)
Follow-up with your GI doctor.

## 2017-08-22 NOTE — ED Triage Notes (Signed)
She reports n/v few episodes since yesterday evening. She also c/o being "dizzy" this morning. She ambulates well without assistance and is in no distress.

## 2017-08-22 NOTE — ED Notes (Signed)
ED Provider at bedside. 

## 2017-08-22 NOTE — ED Notes (Signed)
EDP notified of on going n/v. New order for 12.5 phenergan IV

## 2017-08-23 NOTE — Telephone Encounter (Signed)
Ok to refill Restoril Reminder her than shingarix vaccine cannot be given after starting Humira When her rx is renewed, switch to citrate free

## 2017-08-23 NOTE — Telephone Encounter (Signed)
Ok to begin Humira with induction followed by 40 mg every 14 days Please Rx the citrate-free version It is recommended that she receive the shingarix vaccine BEFORE starting Humira (can start week after vaccine) She has already received the pneumovax and flu vaccine Next available OV with me if not already scheduled

## 2017-08-23 NOTE — Telephone Encounter (Signed)
Wants to speak to Vaughan Basta today if possible please

## 2017-08-23 NOTE — Telephone Encounter (Signed)
Pt already has the started kit that is not citrate free. Is it ok to refill the Restoril?

## 2017-08-23 NOTE — Telephone Encounter (Signed)
Dr.Pyrtle please advise regarding starting Humira and restoril refill.

## 2017-08-24 ENCOUNTER — Other Ambulatory Visit: Payer: Self-pay | Admitting: *Deleted

## 2017-08-24 ENCOUNTER — Other Ambulatory Visit: Payer: Self-pay | Admitting: Internal Medicine

## 2017-08-24 MED ORDER — TEMAZEPAM 30 MG PO CAPS: 30.0000 mg | ORAL_CAPSULE | Freq: Every evening | ORAL | 1 refills | Status: DC | PRN

## 2017-08-24 NOTE — Telephone Encounter (Signed)
Pt aware. Refill sent to pharmacy.

## 2017-08-29 ENCOUNTER — Telehealth: Payer: Self-pay | Admitting: Internal Medicine

## 2017-08-29 ENCOUNTER — Other Ambulatory Visit: Payer: Self-pay

## 2017-08-29 DIAGNOSIS — K50918 Crohn's disease, unspecified, with other complication: Secondary | ICD-10-CM

## 2017-08-29 NOTE — Telephone Encounter (Signed)
Left message for pt to call back to see which [facility] to send script.   Script sent to pharmacy for Shingles vaccine.

## 2017-09-06 ENCOUNTER — Other Ambulatory Visit: Payer: Self-pay

## 2017-09-06 ENCOUNTER — Telehealth: Payer: Self-pay | Admitting: Internal Medicine

## 2017-09-06 DIAGNOSIS — K50918 Crohn's disease, unspecified, with other complication: Secondary | ICD-10-CM

## 2017-09-06 NOTE — Telephone Encounter (Signed)
We can administer if pharmacy will dispense.  If not [facility] then perhaps a [facility] Then after can start biologics

## 2017-09-06 NOTE — Telephone Encounter (Signed)
Pt will call pharmacy to see if they will dispense the vaccine. Pt to let me know if they will not.

## 2017-09-06 NOTE — Telephone Encounter (Signed)
Pt states she went to [facility] to get the shingles vaccine and she was told she would have to see a doctor to have the vaccine due to her age. Prescription sent to pharmacy for shingles vaccine and they still would not give it to her. Please advise. Pt does not have a PCP.

## 2017-09-06 NOTE — Telephone Encounter (Signed)
Patient states she went to get her shingles shot and they are now telling her she has to see a doctor. Patient wants to know what to do.

## 2017-09-09 NOTE — ED Provider Notes (Signed)
[facility] DEPT Provider Note   CSN: [id number] Arrival date & time: 08/22/17  0920     History   Chief Complaint Chief Complaint  Patient presents with  . Emesis    HPI Meghan Charles is a 46 y.o. female.  HPI  46 year old female nausea/vomiting abdominal pain. Onset yesterday. She has a past history of Crohn's disease and feels like she may be having a flare. Normally she will be overly concerned that this morning she was feeling "dizzy" which is abnormally feel so she became concerned and came to the emergency room. She denies eating or drinking much over the last couple days secondary to not feeling very well. No urinary complaints. No fevers or chills. She has had prior surgeries for her Crohn's. She is followed by GI.  Past Medical History:  Diagnosis Date  . Anxiety   . Colon obstruction ([place])   . Colon polyps 05/2014   TUBULAR ADENOMA AND HYPERPLASTIC POLYP.  . Crohn's disease ([place])   . Crohn's disease of small intestine with complication ([place])   . Gallbladder polyp   . Tubular adenoma of colon     Patient Active Problem List   Diagnosis Date Noted  . Encounter for therapeutic drug monitoring 08/10/2017  . Adalimumab (Humira) long-term use 08/10/2017  . Hypokalemia   . 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 RLQ abdominal pain   . Pollen allergies 03/26/2015  . Immunosuppressed status ([place]) 03/26/2015  . Exacerbation of Crohn's disease ([place])   . High risk medication use   . Diarrhea 01/18/2015  . Crohn's colitis ([place]) 12/26/2014  . Essential hypertension   . CAP (community acquired pneumonia) 10/02/2014  . Tachycardia 10/02/2014  . Physical deconditioning 10/02/2014  . PNA (pneumonia) 09/24/2014  . Pleural effusion 09/24/2014  . Nausea with vomiting 09/24/2014  . Leukocytosis 09/24/2014  . Sinus tachycardia 09/24/2014  . Dehydration 09/24/2014  . Gastroesophageal reflux disease without esophagitis 09/01/2014  . Hx of adenomatous colonic polyps 09/01/2014  .  Abdominal pain 11/05/2012  . 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 Nausea and vomiting in adult 11/05/2012  . Blood loss anemia 11/05/2012    Past Surgical History:  Procedure Laterality Date  . COLON RESECTION N/A 05/11/2015   Procedure: Laparotomy with resection of Chron's Disease small bowel resection biopsy mesenteric nodule;  Surgeon: Fanny Skates, MD;  Location: [facility];  Service: General;  Laterality: N/A;  . COLONOSCOPY W/ BIOPSIES  2015  . LAPAROSCOPIC LYSIS OF ADHESIONS  02/2017  . 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 OTHER SURGICAL HISTORY     heel surgery  . Right Heel surgery     with plates and screws  . wisdom teeth extracted      OB History    No data available       Home Medications    Prior to Admission medications   Medication Sig Start Date End Date Taking? Authorizing Provider  acetaminophen (TYLENOL) 500 MG tablet Take 1,000 mg by mouth every 6 (six) hours as needed (pain).   Yes [provider]  medroxyPROGESTERone (DEPO-PROVERA) 150 MG/ML injection Inject 150 mg into the muscle every 3 (three) months.   Yes [provider]  HYDROcodone-acetaminophen (NORCO/VICODIN) 5-325 MG tablet Take 1 tablet by mouth every 4 (four) hours as needed. 08/22/17   Virgel Manifold, MD  promethazine (PHENERGAN) 25 MG tablet Take 1 tablet (25 mg total) by mouth every 6 (six) hours as needed for nausea or vomiting. 08/22/17   Virgel Manifold, MD  temazepam (RESTORIL) 30 MG capsule Take 1 capsule (30 mg total) by mouth at bedtime as needed for sleep. 08/24/17   Pyrtle, Lajuan Lines, MD    Family History Family History  Problem Relation Age of Onset  . Colon cancer Maternal Grandmother 82  . Breast cancer Mother 13  . Esophageal cancer Neg Hx   . Stomach cancer Neg Hx   . Rectal cancer Neg Hx   . Pancreatic cancer Neg Hx     Social History Social History  Substance Use Topics  . Smoking status: Never Smoker  . Smokeless tobacco: Never Used  . Alcohol use 0.0  oz/week     Comment: 3 times per week     Allergies   Morphine and related   Review of Systems Review of Systems  All systems reviewed and negative, other than as noted in HPI.  Physical Exam Updated Vital Signs BP 109/76   Pulse 75   Temp 98.8 F (37.1 C) (Oral)   Resp 14   Ht 5' 8"  (1.727 m)   Wt 65.8 kg (145 lb)   SpO2 100%   BMI 22.05 kg/m   Physical Exam  Constitutional: She appears well-developed and well-nourished. No distress.  HENT:  Head: Normocephalic and atraumatic.  Eyes: Conjunctivae are normal. Right eye exhibits no discharge. Left eye exhibits no discharge.  Neck: Neck supple.  Cardiovascular: Normal rate, regular rhythm and normal heart sounds.  Exam reveals no gallop and no friction rub.   No murmur heard. Pulmonary/Chest: Effort normal and breath sounds normal. No respiratory distress.  Abdominal: Soft. She exhibits no distension. There is tenderness.  Periumbilical tenderness without rebound or guarding.  Musculoskeletal: She exhibits no edema or tenderness.  Neurological: She is alert.  Skin: Skin is warm and dry.  Psychiatric: She has a normal mood and affect. Her behavior is normal. Thought content normal.  Nursing note and vitals reviewed.    ED Treatments / Results  Labs (all labs ordered are listed, but only abnormal results are displayed) Labs Reviewed  COMPREHENSIVE METABOLIC PANEL - Abnormal; Notable for the following:       Result Value   Total Protein 9.2 (*)    All other components within normal limits  URINALYSIS, ROUTINE W REFLEX MICROSCOPIC - Abnormal; Notable for the following:    APPearance CLOUDY (*)    Hgb urine dipstick SMALL (*)    Ketones, ur 20 (*)    Leukocytes, UA SMALL (*)    Bacteria, UA RARE (*)    Squamous Epithelial / LPF 6-30 (*)    All other components within normal limits  CBC WITH DIFFERENTIAL/PLATELET  LIPASE, BLOOD    EKG  EKG Interpretation  Date/Time:  Wednesday August 22 2017 09:32:08  EDT Ventricular Rate:  102 PR Interval:    QRS Duration: 72 QT Interval:  343 QTC Calculation: 447 R Axis:   64 Text Interpretation:  Sinus tachycardia Non-specific ST-t changes Probable left atrial enlargement Confirmed by Virgel Manifold [id number]) on 08/22/2017 9:43:09 AM       Radiology No results found.  Procedures Procedures (including critical care time)  Medications Ordered in ED Medications  sodium chloride 0.9 % bolus 1,000 mL (0 mLs Intravenous Stopped 08/22/17 1422)  ondansetron (ZOFRAN) injection 4 mg (4 mg Intravenous Given 08/22/17 1119)  HYDROmorphone (DILAUDID) injection 1 mg (1 mg Intravenous Given 08/22/17 1119)  promethazine (PHENERGAN) injection 12.5 mg (12.5 mg Intravenous Given 08/22/17 1252)     Initial Impression / Assessment and Plan /  ED Course  I have reviewed the triage vital signs and the nursing notes.  Pertinent labs & imaging results that were available during my care of the patient were reviewed by me and considered in my medical decision making (see chart for details).     46 year old female with nausea/vomiting abdominal pain. Suspect this may be a Crohn's flare. No peritonitis. Imaging discussed with patient. Currently deferring. She feels comfortable following up with GI. She is primarily concerned about feeling dizzy earlier. This has since resolved. This may be secondary to some mild dehydration. She feels much better after medications IV fluids.  It has been determined that no acute conditions requiring further emergency intervention are present at this time. The patient has been advised of the diagnosis and plan. I reviewed any labs and imaging including any potential incidental findings. We have discussed signs and symptoms that warrant return to the ED and they are listed in the discharge instructions.    Final Clinical Impressions(s) / ED Diagnoses   Final diagnoses:  Abdominal pain, unspecified abdominal location    New  Prescriptions Discharge Medication List as of 08/22/2017  4:25 PM    START taking these medications   Details  HYDROcodone-acetaminophen (NORCO/VICODIN) 5-325 MG tablet Take 1 tablet by mouth every 4 (four) hours as needed., Starting Wed 08/22/2017, Print    promethazine (PHENERGAN) 25 MG tablet Take 1 tablet (25 mg total) by mouth every 6 (six) hours as needed for nausea or vomiting., Starting Wed 08/22/2017, Print         Virgel Manifold, MD 09/09/17 [id number]

## 2017-09-26 ENCOUNTER — Telehealth: Payer: Self-pay | Admitting: Internal Medicine

## 2017-09-26 DIAGNOSIS — Z23 Encounter for immunization: Secondary | ICD-10-CM

## 2017-09-26 MED ORDER — ZOSTER VAC RECOMB ADJUVANTED 50 MCG/0.5ML IM SUSR: 0.5000 mL | Freq: Once | INTRAMUSCULAR | 0 refills | Status: AC

## 2017-09-26 NOTE — Telephone Encounter (Signed)
I have advised patient that I have sent Shingrix to [facility] as they state they do have the vaccine available. Patient is to let me know when she gets the vaccine so we can make an appointment to give her the injection. Patient is also advised that I have contacted [facility] regarding her temazepam and they state that she has a refill available. They will fill rx. She verbalizes understanding of all of the above.

## 2017-09-26 NOTE — Telephone Encounter (Signed)
Pharmacist calling to advise that Shingrix is a 2 series injection. I have given verbal instructions (okayed by Dr Hilarie Fredrickson) that 2 series vaccine okay to give to patient. Monica verbalizes understanding.

## 2017-10-02 ENCOUNTER — Telehealth: Payer: Self-pay | Admitting: Internal Medicine

## 2017-10-02 ENCOUNTER — Emergency Department (HOSPITAL_COMMUNITY): Payer: 59

## 2017-10-02 ENCOUNTER — Emergency Department (HOSPITAL_COMMUNITY)
Admission: EM | Admit: 2017-10-02 | Discharge: 2017-10-02 | Disposition: A | Discharge status: Home / Self Care | Payer: 59 | Attending: Emergency Medicine | Admitting: Emergency Medicine

## 2017-10-02 ENCOUNTER — Encounter (HOSPITAL_COMMUNITY): Payer: Self-pay | Admitting: *Deleted

## 2017-10-02 DIAGNOSIS — I10 Essential (primary) hypertension: Secondary | ICD-10-CM | POA: Diagnosis not present

## 2017-10-02 DIAGNOSIS — R1031 Right lower quadrant pain: Secondary | ICD-10-CM | POA: Diagnosis present

## 2017-10-02 DIAGNOSIS — Z79899 Other long term (current) drug therapy: Secondary | ICD-10-CM | POA: Diagnosis not present

## 2017-10-02 DIAGNOSIS — R112 Nausea with vomiting, unspecified: Secondary | ICD-10-CM | POA: Diagnosis not present

## 2017-10-02 DIAGNOSIS — Z885 Allergy status to narcotic agent status: Secondary | ICD-10-CM | POA: Diagnosis not present

## 2017-10-02 DIAGNOSIS — R197 Diarrhea, unspecified: Secondary | ICD-10-CM | POA: Insufficient documentation

## 2017-10-02 DIAGNOSIS — K5080 Crohn's disease of both small and large intestine without complications: Secondary | ICD-10-CM

## 2017-10-02 DIAGNOSIS — K5090 Crohn's disease, unspecified, without complications: Secondary | ICD-10-CM | POA: Insufficient documentation

## 2017-10-02 LAB — CBC
HCT: 39.4 % (ref 36.0–46.0)
Hemoglobin: 13.2 g/dL (ref 12.0–15.0)
MCH: 30.3 pg (ref 26.0–34.0)
MCHC: 33.5 g/dL (ref 30.0–36.0)
MCV: 90.4 fL (ref 78.0–100.0)
PLATELETS: 303 10*3/uL (ref 150–400)
RBC: 4.36 MIL/uL (ref 3.87–5.11)
RDW: 12.9 % (ref 11.5–15.5)
WBC: 6.7 10*3/uL (ref 4.0–10.5)

## 2017-10-02 LAB — LIPASE, BLOOD: Lipase: 41 U/L (ref 11–51)

## 2017-10-02 LAB — URINALYSIS, ROUTINE W REFLEX MICROSCOPIC
BILIRUBIN URINE: NEGATIVE
Glucose, UA: NEGATIVE mg/dL
Ketones, ur: NEGATIVE mg/dL
Nitrite: NEGATIVE
PH: 6 (ref 5.0–8.0)
Protein, ur: NEGATIVE mg/dL
SPECIFIC GRAVITY, URINE: 1.028 (ref 1.005–1.030)

## 2017-10-02 LAB — COMPREHENSIVE METABOLIC PANEL
ALBUMIN: 4.2 g/dL (ref 3.5–5.0)
ALT: 19 U/L (ref 14–54)
AST: 18 U/L (ref 15–41)
Alkaline Phosphatase: 67 U/L (ref 38–126)
Anion gap: 7 (ref 5–15)
BILIRUBIN TOTAL: 0.5 mg/dL (ref 0.3–1.2)
BUN: 24 mg/dL — AB (ref 6–20)
CHLORIDE: 106 mmol/L (ref 101–111)
CO2: 25 mmol/L (ref 22–32)
CREATININE: 0.91 mg/dL (ref 0.44–1.00)
Calcium: 8.7 mg/dL — ABNORMAL LOW (ref 8.9–10.3)
GFR calc Af Amer: >60 mL/min (ref >60)
GFR calc non Af Amer: >60 mL/min (ref >60)
GLUCOSE: 88 mg/dL (ref 65–99)
POTASSIUM: 4.4 mmol/L (ref 3.5–5.1)
Sodium: 138 mmol/L (ref 135–145)
TOTAL PROTEIN: 7.4 g/dL (ref 6.5–8.1)

## 2017-10-02 LAB — POC URINE PREG, ED: PREG TEST UR: NEGATIVE

## 2017-10-02 MED ORDER — ONDANSETRON HCL 4 MG/2ML IJ SOLN
4.0000 mg | Freq: Once | INTRAMUSCULAR | Status: AC
  Administered 2017-10-02: 4 mg via INTRAVENOUS
  Filled 2017-10-02: qty 2

## 2017-10-02 MED ORDER — IOPAMIDOL (ISOVUE-300) INJECTION 61%
100.0000 mL | Freq: Once | INTRAVENOUS | Status: AC | PRN
  Administered 2017-10-02: 100 mL via INTRAVENOUS

## 2017-10-02 MED ORDER — IOPAMIDOL (ISOVUE-300) INJECTION 61%
30.0000 mL | Freq: Once | INTRAVENOUS | Status: AC | PRN
  Administered 2017-10-02: 30 mL via ORAL

## 2017-10-02 MED ORDER — PROMETHAZINE HCL 25 MG PO TABS: 25.0000 mg | ORAL_TABLET | Freq: Three times a day (TID) | ORAL | 0 refills | Status: DC | PRN

## 2017-10-02 MED ORDER — FENTANYL CITRATE (PF) 100 MCG/2ML IJ SOLN
100.0000 ug | Freq: Once | INTRAMUSCULAR | Status: AC
  Administered 2017-10-02: 100 ug via INTRAVENOUS
  Filled 2017-10-02: qty 2

## 2017-10-02 MED ORDER — SODIUM CHLORIDE 0.9 % IV BOLUS (SEPSIS)
1000.0000 mL | Freq: Once | INTRAVENOUS | Status: AC
Start: 2017-10-02 — End: 2017-10-02
  Administered 2017-10-02: 1000 mL via INTRAVENOUS

## 2017-10-02 MED ORDER — PROMETHAZINE HCL 25 MG/ML IJ SOLN
25.0000 mg | Freq: Once | INTRAMUSCULAR | Status: AC
  Administered 2017-10-02: 25 mg via INTRAVENOUS
  Filled 2017-10-02: qty 1

## 2017-10-02 MED ORDER — IOPAMIDOL (ISOVUE-300) INJECTION 61%
INTRAVENOUS | Status: AC
  Filled 2017-10-02: qty 100

## 2017-10-02 MED ORDER — IOPAMIDOL (ISOVUE-300) INJECTION 61%
INTRAVENOUS | Status: AC
  Filled 2017-10-02: qty 30

## 2017-10-02 MED ORDER — HYDROMORPHONE HCL 1 MG/ML IJ SOLN
1.0000 mg | Freq: Once | INTRAMUSCULAR | Status: AC
  Administered 2017-10-02: 1 mg via INTRAVENOUS
  Filled 2017-10-02: qty 1

## 2017-10-02 MED ORDER — HYDROCODONE-ACETAMINOPHEN 5-325 MG PO TABS: 1.0000 | ORAL_TABLET | Freq: Four times a day (QID) | ORAL | 0 refills | Status: DC | PRN

## 2017-10-02 MED ORDER — PREDNISONE 50 MG PO TABS: 50.0000 mg | ORAL_TABLET | Freq: Every day | ORAL | 0 refills | Status: DC

## 2017-10-02 NOTE — ED Triage Notes (Signed)
Pt complains of RLQ pain, emesis, diarrhea since last night. Pt states she believes she is having Crohn's flare up. Pt denies bloody stool.

## 2017-10-02 NOTE — Discharge Instructions (Signed)
Return here as needed.  Follow-up with your GI specialist.  Slowly increase your fluid intake

## 2017-10-02 NOTE — Telephone Encounter (Signed)
FYI-Looks like she is in the ER now.

## 2017-10-02 NOTE — ED Provider Notes (Signed)
[facility] DEPT Provider Note   CSN: [id number] Arrival date & time: 10/02/17  1102     History   Chief Complaint Chief Complaint  Patient presents with  . Abdominal Pain  . Emesis  . Diarrhea    HPI Meghan Charles is a 46 y.o. female.  HPI Patient presents to the emergency department with abdominal discomfort with nausea, vomiting that started last night.  Patient states she feels like this is a flare-up of her Crohn's disease. Patient states that she is supposed to start Humira in the next week. Patient states that nothing seems make the condition better or worse.  She states this feels pretty typical for her Crohn's flare. The patient denies chest pain, shortness of breath, headache,blurred vision, neck pain, fever, cough, weakness, numbness, dizziness, anorexia, edema,  rash, back pain, dysuria, hematemesis, bloody stool, near syncope, or syncope. Past Medical History:  Diagnosis Date  . Anxiety   . Colon obstruction ([place])   . Colon polyps 05/2014   TUBULAR ADENOMA AND HYPERPLASTIC POLYP.  . Crohn's disease ([place])   . Crohn's disease of small intestine with complication ([place])   . Gallbladder polyp   . Tubular adenoma of colon     Patient Active Problem List   Diagnosis Date Noted  . Encounter for therapeutic drug monitoring 08/10/2017  . Adalimumab (Humira) long-term use 08/10/2017  . Hypokalemia   . 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 RLQ abdominal pain   . Pollen allergies 03/26/2015  . Immunosuppressed status ([place]) 03/26/2015  . Exacerbation of Crohn's disease ([place])   . High risk medication use   . Diarrhea 01/18/2015  . Crohn's colitis ([place]) 12/26/2014  . Essential hypertension   . CAP (community acquired pneumonia) 10/02/2014  . Tachycardia 10/02/2014  . Physical deconditioning 10/02/2014  . PNA (pneumonia) 09/24/2014  . Pleural effusion 09/24/2014  . Nausea with vomiting 09/24/2014  . Leukocytosis 09/24/2014  . Sinus  tachycardia 09/24/2014  . Dehydration 09/24/2014  . Gastroesophageal reflux disease without esophagitis 09/01/2014  . Hx of adenomatous colonic polyps 09/01/2014  . Abdominal pain 11/05/2012  . 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 Nausea and vomiting in adult 11/05/2012  . Blood loss anemia 11/05/2012    Past Surgical History:  Procedure Laterality Date  . COLON RESECTION N/A 05/11/2015   Procedure: Laparotomy with resection of Chron's Disease small bowel resection biopsy mesenteric nodule;  Surgeon: Fanny Skates, MD;  Location: [facility];  Service: General;  Laterality: N/A;  . COLONOSCOPY W/ BIOPSIES  2015  . LAPAROSCOPIC LYSIS OF ADHESIONS  02/2017  . 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 OTHER SURGICAL HISTORY     heel surgery  . Right Heel surgery     with plates and screws  . wisdom teeth extracted      OB History    No data available       Home Medications    Prior to Admission medications   Medication Sig Start Date End Date Taking? Authorizing Provider  acetaminophen (TYLENOL) 500 MG tablet Take 1,000 mg by mouth every 6 (six) hours as needed (pain).   Yes [provider]  medroxyPROGESTERone (DEPO-PROVERA) 150 MG/ML injection Inject 150 mg into the muscle every 3 (three) months.   Yes [provider]  temazepam (RESTORIL) 30 MG capsule Take 1 capsule (30 mg total) by mouth at bedtime as needed for sleep. 08/24/17  Yes Pyrtle, Lajuan Lines, MD  HYDROcodone-acetaminophen (NORCO/VICODIN) 5-325 MG tablet Take 1 tablet by mouth  every 4 (four) hours as needed. Patient not taking: Reported on 10/02/2017 08/22/17   Virgel Manifold, MD  promethazine (PHENERGAN) 25 MG tablet Take 1 tablet (25 mg total) by mouth every 6 (six) hours as needed for nausea or vomiting. Patient not taking: Reported on 10/02/2017 08/22/17   Virgel Manifold, MD    Family History Family History  Problem Relation Age of Onset  . Colon cancer Maternal Grandmother 6  . Breast cancer Mother 60    . Esophageal cancer Neg Hx   . Stomach cancer Neg Hx   . Rectal cancer Neg Hx   . Pancreatic cancer Neg Hx     Social History Social History  Substance Use Topics  . Smoking status: Never Smoker  . Smokeless tobacco: Never Used  . Alcohol use 0.0 oz/week     Comment: 3 times per week     Allergies   Morphine and related   Review of Systems Review of Systems  All other systems negative except as documented in the HPI. All pertinent positives and negatives as reviewed in the HPI. Physical Exam Updated Vital Signs BP 117/81   Pulse 81   Temp 98.5 F (36.9 C) (Oral)   Resp 16   Ht 5\' 8"  (1.727 m)   Wt 67.1 kg (148 lb)   SpO2 100%   BMI 22.50 kg/m   Physical Exam  Constitutional: She is oriented to person, place, and time. She appears well-developed and well-nourished. No distress.  HENT:  Head: Normocephalic and atraumatic.  Mouth/Throat: Oropharynx is clear and moist.  Eyes: Pupils are equal, round, and reactive to light.  Neck: Normal range of motion. Neck supple.  Cardiovascular: Normal rate, regular rhythm and normal heart sounds.  Exam reveals no gallop and no friction rub.   No murmur heard. Pulmonary/Chest: Effort normal and breath sounds normal. No respiratory distress. She has no wheezes.  Abdominal: Soft. Bowel sounds are normal. She exhibits no distension. There is no tenderness.  Neurological: She is alert and oriented to person, place, and time. She exhibits normal muscle tone. Coordination normal.  Skin: Skin is warm and dry. Capillary refill takes less than 2 seconds. No rash noted. No erythema.  Psychiatric: She has a normal mood and affect. Her behavior is normal.  Nursing note and vitals reviewed.    ED Treatments / Results  Labs (all labs ordered are listed, but only abnormal results are displayed) Labs Reviewed  COMPREHENSIVE METABOLIC PANEL - Abnormal; Notable for the following:       Result Value   BUN 24 (*)    Calcium 8.7 (*)    All  other components within normal limits  URINALYSIS, ROUTINE W REFLEX MICROSCOPIC - Abnormal; Notable for the following:    APPearance HAZY (*)    Hgb urine dipstick LARGE (*)    Leukocytes, UA TRACE (*)    Bacteria, UA RARE (*)    Squamous Epithelial / LPF 6-30 (*)    All other components within normal limits  LIPASE, BLOOD  CBC  POC URINE PREG, ED    EKG  EKG Interpretation None       Radiology Ct Abdomen Pelvis W Contrast  Result Date: 10/02/2017 CLINICAL DATA:  Right lower quadrant pain, diarrhea and emesis. EXAM: CT ABDOMEN AND PELVIS WITH CONTRAST TECHNIQUE: Multidetector CT imaging of the abdomen and pelvis was performed using the standard protocol following bolus administration of intravenous contrast. CONTRAST:  180mL ISOVUE-300 IOPAMIDOL (ISOVUE-300) INJECTION 61% COMPARISON:  CT abdomen pelvis  09/25/2016 Abdominal MRI 10/07/2016 FINDINGS: Lower chest: No pulmonary nodules or pleural effusion. No visible pericardial effusion. Hepatobiliary: Normal hepatic contours and density. No visible biliary dilatation. Normal gallbladder. Pancreas: Normal contours without ductal dilatation. No peripancreatic fluid collection. Spleen: Normal. Adrenals/Urinary Tract: --Adrenal glands: Unchanged 14 mm right adrenal adenoma. Normal left adrenal gland. --Right kidney/ureter: No hydronephrosis or perinephric stranding. No nephrolithiasis. No obstructing ureteral stones. --Left kidney/ureter: No hydronephrosis or perinephric stranding. No nephrolithiasis. No obstructing ureteral stones. --Urinary bladder: Unremarkable. Stomach/Bowel: --Stomach/Duodenum: No hiatal hernia or other gastric abnormality. Normal duodenal course and caliber. --Small bowel: No dilatation or inflammation. --Colon: No focal abnormality. --Appendix: Normal. Vascular/Lymphatic: Normal course and caliber of the major abdominal vessels. No abdominal or pelvic lymphadenopathy. Reproductive: Normal uterus and ovaries. Musculoskeletal.  No bony spinal canal stenosis or focal osseous abnormality. Other: Bilateral breast implants. IMPRESSION: 1. No acute abdominopelvic abnormality. No evidence of active Crohn's disease. 2. Unchanged right adrenal adenoma. Electronically Signed   By: Ulyses Jarred M.D.   On: 10/02/2017 20:43    Procedures Procedures (including critical care time)  Medications Ordered in ED Medications  iopamidol (ISOVUE-300) 61 % injection (not administered)  iopamidol (ISOVUE-300) 61 % injection (not administered)  sodium chloride 0.9 % bolus 1,000 mL (1,000 mLs Intravenous New Bag/Given 10/02/17 1723)  promethazine (PHENERGAN) injection 25 mg (25 mg Intravenous Given 10/02/17 1723)  fentaNYL (SUBLIMAZE) injection 100 mcg (100 mcg Intravenous Given 10/02/17 1723)  iopamidol (ISOVUE-300) 61 % injection 30 mL (30 mLs Oral Contrast Given 10/02/17 1818)  iopamidol (ISOVUE-300) 61 % injection 100 mL (100 mLs Intravenous Contrast Given 10/02/17 2021)     Initial Impression / Assessment and Plan / ED Course  I have reviewed the triage vital signs and the nursing notes.  Pertinent labs & imaging results that were available during my care of the patient were reviewed by me and considered in my medical decision making (see chart for details).     Patient is feeling some better following the medications and IV fluids.  We will have the patient follow-up with her GI doctor.  I reviewed the patient's results and all questions were answered.  Her vital signs have improved following IV fluids  Patient agrees to the plan and all questions were answered.   Final Clinical Impressions(s) / ED Diagnoses   Final diagnoses:  None    New Prescriptions New Prescriptions   No medications on file     Rebeca Allegra 10/02/17 2147    Isla Pence, MD 10/02/17 2231

## 2017-10-24 ENCOUNTER — Telehealth: Payer: Self-pay | Admitting: Internal Medicine

## 2017-10-24 ENCOUNTER — Other Ambulatory Visit: Payer: Self-pay

## 2017-10-24 MED ORDER — TEMAZEPAM 30 MG PO CAPS: 30.0000 mg | ORAL_CAPSULE | Freq: Every evening | ORAL | 1 refills | Status: DC | PRN

## 2017-10-24 NOTE — Telephone Encounter (Signed)
Refill request for temazepam 30 mg QHS for sleep. Last seen 05-2017. Please advise.

## 2017-10-24 NOTE — Telephone Encounter (Signed)
Refilled as directed.  

## 2017-10-24 NOTE — Telephone Encounter (Signed)
ok 

## 2017-11-12 ENCOUNTER — Ambulatory Visit: Payer: 59 | Admitting: Internal Medicine

## 2017-12-13 ENCOUNTER — Encounter (HOSPITAL_COMMUNITY): Payer: Self-pay

## 2017-12-13 ENCOUNTER — Emergency Department (HOSPITAL_COMMUNITY): Payer: 59

## 2017-12-13 ENCOUNTER — Emergency Department (HOSPITAL_COMMUNITY)
Admission: EM | Admit: 2017-12-13 | Discharge: 2017-12-13 | Disposition: A | Discharge status: Home / Self Care | Payer: 59 | Attending: Emergency Medicine | Admitting: Emergency Medicine

## 2017-12-13 ENCOUNTER — Other Ambulatory Visit: Payer: Self-pay

## 2017-12-13 DIAGNOSIS — R112 Nausea with vomiting, unspecified: Secondary | ICD-10-CM | POA: Diagnosis not present

## 2017-12-13 DIAGNOSIS — R1110 Vomiting, unspecified: Secondary | ICD-10-CM | POA: Diagnosis not present

## 2017-12-13 DIAGNOSIS — R197 Diarrhea, unspecified: Secondary | ICD-10-CM | POA: Insufficient documentation

## 2017-12-13 DIAGNOSIS — R1011 Right upper quadrant pain: Secondary | ICD-10-CM | POA: Diagnosis not present

## 2017-12-13 DIAGNOSIS — Z79899 Other long term (current) drug therapy: Secondary | ICD-10-CM | POA: Insufficient documentation

## 2017-12-13 DIAGNOSIS — R1084 Generalized abdominal pain: Secondary | ICD-10-CM | POA: Insufficient documentation

## 2017-12-13 DIAGNOSIS — K5090 Crohn's disease, unspecified, without complications: Secondary | ICD-10-CM | POA: Insufficient documentation

## 2017-12-13 DIAGNOSIS — R1031 Right lower quadrant pain: Secondary | ICD-10-CM | POA: Diagnosis present

## 2017-12-13 LAB — I-STAT CG4 LACTIC ACID, ED: Lactic Acid, Venous: 1.63 mmol/L (ref 0.5–1.9)

## 2017-12-13 LAB — COMPREHENSIVE METABOLIC PANEL
ALT: 17 U/L (ref 14–54)
AST: 21 U/L (ref 15–41)
Albumin: 4.9 g/dL (ref 3.5–5.0)
Alkaline Phosphatase: 72 U/L (ref 38–126)
Anion gap: 11 (ref 5–15)
BILIRUBIN TOTAL: 0.3 mg/dL (ref 0.3–1.2)
BUN: 19 mg/dL (ref 6–20)
CO2: 25 mmol/L (ref 22–32)
CREATININE: 0.69 mg/dL (ref 0.44–1.00)
Calcium: 9.8 mg/dL (ref 8.9–10.3)
Chloride: 105 mmol/L (ref 101–111)
Glucose, Bld: 115 mg/dL — ABNORMAL HIGH (ref 65–99)
POTASSIUM: 3.9 mmol/L (ref 3.5–5.1)
Sodium: 141 mmol/L (ref 135–145)
TOTAL PROTEIN: 9 g/dL — AB (ref 6.5–8.1)

## 2017-12-13 LAB — CBC
HCT: 45.4 % (ref 36.0–46.0)
Hemoglobin: 15.6 g/dL — ABNORMAL HIGH (ref 12.0–15.0)
MCH: 31 pg (ref 26.0–34.0)
MCHC: 34.4 g/dL (ref 30.0–36.0)
MCV: 90.1 fL (ref 78.0–100.0)
PLATELETS: 321 10*3/uL (ref 150–400)
RBC: 5.04 MIL/uL (ref 3.87–5.11)
RDW: 12.2 % (ref 11.5–15.5)
WBC: 11.1 10*3/uL — AB (ref 4.0–10.5)

## 2017-12-13 LAB — URINALYSIS, ROUTINE W REFLEX MICROSCOPIC
BILIRUBIN URINE: NEGATIVE
GLUCOSE, UA: NEGATIVE mg/dL
Ketones, ur: 5 mg/dL — AB
LEUKOCYTES UA: NEGATIVE
NITRITE: NEGATIVE
PH: 5 (ref 5.0–8.0)
Protein, ur: 30 mg/dL — AB
SPECIFIC GRAVITY, URINE: 1.03 (ref 1.005–1.030)

## 2017-12-13 LAB — I-STAT BETA HCG BLOOD, ED (MC, WL, AP ONLY): I-stat hCG, quantitative: <5 m[IU]/mL (ref <5)

## 2017-12-13 LAB — LIPASE, BLOOD: Lipase: 35 U/L (ref 11–51)

## 2017-12-13 LAB — PROTIME-INR
INR: 0.87
Prothrombin Time: 11.8 seconds (ref 11.4–15.2)

## 2017-12-13 MED ORDER — SODIUM CHLORIDE 0.9 % IV BOLUS (SEPSIS)
1000.0000 mL | Freq: Once | INTRAVENOUS | Status: AC
  Administered 2017-12-13: 1000 mL via INTRAVENOUS

## 2017-12-13 MED ORDER — IOPAMIDOL (ISOVUE-300) INJECTION 61%
INTRAVENOUS | Status: AC
  Filled 2017-12-13: qty 100

## 2017-12-13 MED ORDER — PROMETHAZINE HCL 25 MG/ML IJ SOLN
25.0000 mg | Freq: Once | INTRAMUSCULAR | Status: AC
  Administered 2017-12-13: 25 mg via INTRAVENOUS
  Filled 2017-12-13: qty 1

## 2017-12-13 MED ORDER — IOPAMIDOL (ISOVUE-300) INJECTION 61%
INTRAVENOUS | Status: AC
  Filled 2017-12-13: qty 30

## 2017-12-13 MED ORDER — IOPAMIDOL (ISOVUE-300) INJECTION 61%
30.0000 mL | Freq: Once | INTRAVENOUS | Status: AC | PRN
  Administered 2017-12-13: 30 mL via ORAL

## 2017-12-13 MED ORDER — OXYCODONE-ACETAMINOPHEN 5-325 MG PO TABS: 1.0000 | ORAL_TABLET | ORAL | 0 refills | Status: DC | PRN

## 2017-12-13 MED ORDER — FENTANYL CITRATE (PF) 100 MCG/2ML IJ SOLN
50.0000 ug | Freq: Once | INTRAMUSCULAR | Status: AC
  Administered 2017-12-13: 50 ug via INTRAVENOUS
  Filled 2017-12-13: qty 2

## 2017-12-13 MED ORDER — OXYCODONE-ACETAMINOPHEN 5-325 MG PO TABS
1.0000 | ORAL_TABLET | Freq: Once | ORAL | Status: AC
  Administered 2017-12-13: 1 via ORAL
  Filled 2017-12-13: qty 1

## 2017-12-13 MED ORDER — ONDANSETRON HCL 4 MG PO TABS: 4.0000 mg | ORAL_TABLET | Freq: Three times a day (TID) | ORAL | 0 refills | Status: DC | PRN

## 2017-12-13 MED ORDER — IOPAMIDOL (ISOVUE-300) INJECTION 61%
100.0000 mL | Freq: Once | INTRAVENOUS | Status: AC | PRN
  Administered 2017-12-13: 100 mL via INTRAVENOUS

## 2017-12-13 NOTE — ED Triage Notes (Signed)
Patient c/o vomiting, right mid abdominal pain and diarrhea x 2 days, but worse today. Patient has history of Crohn's.

## 2017-12-13 NOTE — Discharge Instructions (Signed)
Your workup today did not show evidence of a Crohn's flare, obstruction, or bacterial infection.  Please stay hydrated and use the medications to help with your symptoms.  Please follow-up with your primary care physician in several days for reassessment.  If any symptoms change or worsen, please return to the nearest emergency department.

## 2017-12-13 NOTE — ED Provider Notes (Signed)
[facility] DEPT Provider Note   CSN: [id number] Arrival date & time: 12/13/17  0809     History   Chief Complaint Chief Complaint  Patient presents with  . Emesis  . Diarrhea  . Abdominal Pain    HPI Meghan Charles is a 46 y.o. female.  The history is provided by the patient and medical records. No language interpreter was used.  Abdominal Pain   This is a recurrent problem. The current episode started 2 days ago. The problem occurs constantly. The problem has not changed since onset.The pain is associated with an unknown factor. The pain is located in the RLQ and RUQ. The quality of the pain is aching and cramping. The pain is at a severity of 10/10. The pain is severe. Associated symptoms include diarrhea (chronic), nausea and vomiting. Pertinent negatives include fever, constipation, dysuria, frequency, hematuria and headaches. The symptoms are aggravated by palpation. Past workup includes surgery. Her past medical history is significant for Crohn's disease.    Past Medical History:  Diagnosis Date  . Anxiety   . Colon obstruction ([place])   . Colon polyps 05/2014   TUBULAR ADENOMA AND HYPERPLASTIC POLYP.  . Crohn's disease ([place])   . Crohn's disease of small intestine with complication ([place])   . Gallbladder polyp   . Tubular adenoma of colon     Patient Active Problem List   Diagnosis Date Noted  . Encounter for therapeutic drug monitoring 08/10/2017  . Adalimumab (Humira) long-term use 08/10/2017  . Hypokalemia   . 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 RLQ abdominal pain   . Pollen allergies 03/26/2015  . Immunosuppressed status ([place]) 03/26/2015  . Exacerbation of Crohn's disease ([place])   . High risk medication use   . Diarrhea 01/18/2015  . Crohn's colitis ([place]) 12/26/2014  . Essential hypertension   . CAP (community acquired pneumonia) 10/02/2014  . Tachycardia 10/02/2014  . Physical deconditioning 10/02/2014  . PNA (pneumonia)  09/24/2014  . Pleural effusion 09/24/2014  . Nausea with vomiting 09/24/2014  . Leukocytosis 09/24/2014  . Sinus tachycardia 09/24/2014  . Dehydration 09/24/2014  . Gastroesophageal reflux disease without esophagitis 09/01/2014  . Hx of adenomatous colonic polyps 09/01/2014  . Abdominal pain 11/05/2012  . 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 Nausea and vomiting in adult 11/05/2012  . Blood loss anemia 11/05/2012    Past Surgical History:  Procedure Laterality Date  . COLON RESECTION N/A 05/11/2015   Procedure: Laparotomy with resection of Chron's Disease small bowel resection biopsy mesenteric nodule;  Surgeon: Fanny Skates, MD;  Location: [facility];  Service: General;  Laterality: N/A;  . COLONOSCOPY W/ BIOPSIES  2015  . LAPAROSCOPIC LYSIS OF ADHESIONS  02/2017  . 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 OTHER SURGICAL HISTORY     heel surgery  . Right Heel surgery     with plates and screws  . wisdom teeth extracted      OB History    No data available       Home Medications    Prior to Admission medications   Medication Sig Start Date End Date Taking? Authorizing Provider  acetaminophen (TYLENOL) 500 MG tablet Take 1,000 mg by mouth every 6 (six) hours as needed (pain).    [provider]  HYDROcodone-acetaminophen (NORCO/VICODIN) 5-325 MG tablet Take 1 tablet by mouth every 6 (six) hours as needed. 10/02/17   Lawyer, Harrell Gave, PA-C  medroxyPROGESTERone (DEPO-PROVERA) 150 MG/ML injection Inject 150 mg into the muscle every 3 (  three) months.    [provider]  predniSONE (DELTASONE) 50 MG tablet Take 1 tablet (50 mg total) by mouth daily. 10/02/17   Lawyer, Harrell Gave, PA-C  promethazine (PHENERGAN) 25 MG tablet Take 1 tablet (25 mg total) by mouth every 8 (eight) hours as needed for nausea or vomiting. 10/02/17   Lawyer, Harrell Gave, PA-C  temazepam (RESTORIL) 30 MG capsule Take 1 capsule (30 mg total) at bedtime as needed by mouth for sleep. 10/24/17    Pyrtle, Lajuan Lines, MD    Family History Family History  Problem Relation Age of Onset  . Colon cancer Maternal Grandmother 29  . Breast cancer Mother 73  . Esophageal cancer Neg Hx   . Stomach cancer Neg Hx   . Rectal cancer Neg Hx   . Pancreatic cancer Neg Hx     Social History Social History   Tobacco Use  . Smoking status: Never Smoker  . Smokeless tobacco: Never Used  Substance Use Topics  . Alcohol use: Yes    Alcohol/week: 0.0 oz    Comment: 3 times per week  . Drug use: No     Allergies   Morphine and related   Review of Systems Review of Systems  Constitutional: Negative for chills, diaphoresis, fatigue and fever.  HENT: Negative for congestion and rhinorrhea.   Respiratory: Negative for cough, chest tightness and shortness of breath.   Cardiovascular: Negative for chest pain and palpitations.  Gastrointestinal: Positive for abdominal pain, diarrhea (chronic), nausea and vomiting. Negative for constipation.  Genitourinary: Negative for dysuria, flank pain, frequency and hematuria.  Musculoskeletal: Negative for back pain, neck pain and neck stiffness.  Skin: Negative for rash and wound.  Neurological: Negative for light-headedness, numbness and headaches.  Psychiatric/Behavioral: Negative for agitation.  All other systems reviewed and are negative.    Physical Exam Updated Vital Signs BP (!) 156/98   Pulse (!) 122   Temp (!) 97.4 F (36.3 C) (Oral)   Resp 20   Ht 5' 8"  (1.727 m)   Wt 66.7 kg (147 lb)   SpO2 100%   BMI 22.35 kg/m   Physical Exam  Constitutional: She is oriented to person, place, and time. She appears well-developed and well-nourished. No distress.  HENT:  Head: Normocephalic.  Nose: Nose normal.  Mouth/Throat: Oropharynx is clear and moist. No oropharyngeal exudate.  Eyes: Conjunctivae and EOM are normal. Pupils are equal, round, and reactive to light.  Neck: Normal range of motion.  Cardiovascular: Intact distal pulses.  Tachycardia present.  No murmur heard. Pulmonary/Chest: Effort normal and breath sounds normal. No respiratory distress. She has no wheezes. She has no rales. She exhibits no tenderness.  Abdominal: Soft. She exhibits no distension. There is tenderness in the right upper quadrant and right lower quadrant. There is no rigidity and no CVA tenderness.    Musculoskeletal: She exhibits no edema or tenderness.  Neurological: She is alert and oriented to person, place, and time. No sensory deficit. She exhibits normal muscle tone.  Skin: Capillary refill takes less than 2 seconds. No rash noted. She is not diaphoretic.  Psychiatric: She has a normal mood and affect.  Nursing note and vitals reviewed.    ED Treatments / Results  Labs (all labs ordered are listed, but only abnormal results are displayed) Labs Reviewed  COMPREHENSIVE METABOLIC PANEL - Abnormal; Notable for the following components:      Result Value   Glucose, Bld 115 (*)    Total Protein 9.0 (*)  All other components within normal limits  CBC - Abnormal; Notable for the following components:   WBC 11.1 (*)    Hemoglobin 15.6 (*)    All other components within normal limits  URINALYSIS, ROUTINE W REFLEX MICROSCOPIC - Abnormal; Notable for the following components:   Hgb urine dipstick SMALL (*)    Ketones, ur 5 (*)    Protein, ur 30 (*)    Bacteria, UA RARE (*)    Squamous Epithelial / LPF 0-5 (*)    All other components within normal limits  URINE CULTURE  LIPASE, BLOOD  PROTIME-INR  I-STAT BETA HCG BLOOD, ED ([facility], [facility], [facility] ONLY)  I-STAT CG4 LACTIC ACID, ED    EKG  EKG Interpretation None       Radiology Ct Abdomen Pelvis W Contrast  Result Date: 12/13/2017 CLINICAL DATA:  Right-sided abdominal pain with nausea and vomiting. History of Crohn's disease with obstructions. Multiple abdominal surgeries. EXAM: CT ABDOMEN AND PELVIS WITH CONTRAST TECHNIQUE: Multidetector CT imaging of the abdomen and pelvis was  performed using the standard protocol following bolus administration of intravenous contrast. CONTRAST:  132m ISOVUE-300 IOPAMIDOL (ISOVUE-300) INJECTION 61% COMPARISON:  Abdominopelvic CT 10/02/2017. FINDINGS: Lower chest: Clear lung bases. No significant pleural or pericardial effusion. Hepatobiliary: The liver is normal in density without focal abnormality. No evidence of gallstones, gallbladder wall thickening or biliary dilatation. Pancreas: Unremarkable. No pancreatic ductal dilatation or surrounding inflammatory changes. Spleen: Normal in size without focal abnormality. Adrenals/Urinary Tract: Stable 3.0 x 2.2 cm right adrenal nodule measuring a nonspecific 50 HU. Based on stability, this is consistent with an adenoma. The left adrenal gland appears normal. The kidneys appear normal without evidence of urinary tract calculus, suspicious lesion or hydronephrosis. No bladder abnormalities are seen. Stomach/Bowel: No evidence of bowel wall thickening, distention or surrounding inflammatory change. The terminal ileum and cecum appear normal. Mild sigmoid colon diverticular changes. Vascular/Lymphatic: There are no enlarged abdominal or pelvic lymph nodes. No significant vascular findings are present. Reproductive: The uterus and ovaries appear normal. No adnexal mass or pelvic inflammation. Other: Postsurgical changes in the anterior abdominal wall. No hernia or ascites. Musculoskeletal: No acute or significant osseous findings. Bilateral breast implants are noted. There are mild degenerative changes of sacroiliac joints. No evidence of sacroiliitis. IMPRESSION: 1. No acute findings or explanation for the patient's symptoms. No evidence of active Crohn's disease or bowel obstruction. 2. The appendix appears normal. 3. Stable right adrenal adenoma. Electronically Signed   By: WRichardean SaleM.D.   On: 12/13/2017 12:27    Procedures Procedures (including critical care time)  Medications Ordered in  ED Medications  sodium chloride 0.9 % bolus 1,000 mL (0 mLs Intravenous Stopped 12/13/17 1035)  sodium chloride 0.9 % bolus 1,000 mL (0 mLs Intravenous Stopped 12/13/17 1316)  promethazine (PHENERGAN) injection 25 mg (25 mg Intravenous Given 12/13/17 0921)  fentaNYL (SUBLIMAZE) injection 50 mcg (50 mcg Intravenous Given 12/13/17 0921)  iopamidol (ISOVUE-300) 61 % injection 30 mL (30 mLs Oral Contrast Given 12/13/17 0901)  iopamidol (ISOVUE-300) 61 % injection 100 mL (100 mLs Intravenous Contrast Given 12/13/17 1147)  sodium chloride 0.9 % bolus 1,000 mL (0 mLs Intravenous Stopped 12/13/17 1522)  oxyCODONE-acetaminophen (PERCOCET/ROXICET) 5-325 MG per tablet 1 tablet (1 tablet Oral Given 12/13/17 1349)     Initial Impression / Assessment and Plan / ED Course  I have reviewed the triage vital signs and the nursing notes.  Pertinent labs & imaging results that were available during my care of the  patient were reviewed by me and considered in my medical decision making (see chart for details).     Meghan Charles is a 46 y.o. female with a past medical history significant for Crohn's with bowel obstruction status post abdominal surgeries and hypertension who presents with right-sided abdominal pain, nausea, vomiting, and malaise.  Patient reports that over the last few days she has had development severe right-sided abdominal pain.  She describes it as 10 out of 10 in severity, aching, and sharp.  She reports it is similar to prior Crohn's flares.  She says that the quality of the discomfort feels similar to prior obstructions however she is still passing gas and having bowel movements.  She reports that she has had chronically loose stools but denies any rectal bleeding.  She says that she has not been eating or drinking over the last few days due to discomfort and has not been able to tolerate oral medications at home.  She denies fevers or chills.  She denies any chest pain, shortness of breath,  or palpitations.  She does report a mild dry cough.  Patient denies recent traumatic injuries or any urinary symptoms.  Patient denied any vaginal discharge or vaginal bleeding.  No pelvic pain.  On exam, patient has tenderness in the right upper and right lower quadrant of the abdomen.  Patient had no flank tenderness or CVA tenderness.  Lungs are clear.  No rhonchi or wheezing appreciated.  Patient's legs had symmetric pulses and were nonedematous.  Patient will be given pain medications nausea medicine and fluids.  Patient was tachycardic on arrival.  Patient will have laboratory testing as well as imaging to look for Crohn's flare, obstruction, gallbladder, or appendix etiology of her symptoms.  Anticipate reassessment after workup.  3:58 PM Diagnostic testing overall reassuring.  Lactic acid negative.  Lipase not elevated.  CMP shows normal liver normal kidney function.  Reassuring electrolytes.  CBC shows mild leukocytosis and elevated hemoglobin, suspect hemoconcentration in the setting of dehydration.  CT scan showed no acute ab normalities causing her symptoms.  Patient was reassured based on her findings.  Patient began feeling better after pain and nausea medicine.  Patient may have had a gastroenteritis that in the setting of her chronic Crohn's, caused severe discomfort.  Patient was monitored for several hours with continued improvement in symptoms.  Patient was able to ambulate and eat without difficulty.  Patient will be given prescription for pain medicine and nausea medicine and will follow up with PCP in several days.  Patient had no other questions or concerns and understood return precautions.  Patient discharged in good condition.   Final Clinical Impressions(s) / ED Diagnoses   Final diagnoses:  Nausea vomiting and diarrhea  Generalized abdominal pain    ED Discharge Orders        Ordered    oxyCODONE-acetaminophen (PERCOCET/ROXICET) 5-325 MG tablet  Every 4 hours PRN      12/13/17 1600    ondansetron (ZOFRAN) 4 MG tablet  Every 8 hours PRN     12/13/17 1600      Clinical Impression: 1. Nausea vomiting and diarrhea   2. Generalized abdominal pain     Disposition: Discharge  Condition: Good  I have discussed the results, Dx and Tx plan with the pt(& family if present). He/she/they expressed understanding and agree(s) with the plan. Discharge instructions discussed at great length. Strict return precautions discussed and pt &/or family have verbalized understanding of the instructions. No further questions  at time of discharge.    This SmartLink is deprecated. Use AVSMEDLIST instead to display the medication list for a patient.  Follow Up: [place] [id number] [phone] Schedule an appointment as soon as possible for a visit    [facility] [place] [phone] [facility] [address] [place]         Tegeler, Gwenyth Allegra, MD 12/13/17 [phone]

## 2017-12-15 LAB — URINE CULTURE

## 2017-12-24 ENCOUNTER — Emergency Department (HOSPITAL_COMMUNITY): Payer: 59

## 2017-12-24 ENCOUNTER — Emergency Department (HOSPITAL_COMMUNITY)
Admission: EM | Admit: 2017-12-24 | Discharge: 2017-12-25 | Disposition: A | Discharge status: Home / Self Care | Payer: 59 | Attending: Emergency Medicine | Admitting: Emergency Medicine

## 2017-12-24 ENCOUNTER — Encounter (HOSPITAL_COMMUNITY): Payer: Self-pay | Admitting: Emergency Medicine

## 2017-12-24 DIAGNOSIS — Z79899 Other long term (current) drug therapy: Secondary | ICD-10-CM | POA: Diagnosis not present

## 2017-12-24 DIAGNOSIS — R112 Nausea with vomiting, unspecified: Secondary | ICD-10-CM | POA: Insufficient documentation

## 2017-12-24 DIAGNOSIS — R109 Unspecified abdominal pain: Secondary | ICD-10-CM | POA: Diagnosis not present

## 2017-12-24 LAB — CBC
HCT: 41.7 % (ref 36.0–46.0)
Hemoglobin: 14 g/dL (ref 12.0–15.0)
MCH: 30.3 pg (ref 26.0–34.0)
MCHC: 33.6 g/dL (ref 30.0–36.0)
MCV: 90.3 fL (ref 78.0–100.0)
PLATELETS: 337 10*3/uL (ref 150–400)
RBC: 4.62 MIL/uL (ref 3.87–5.11)
RDW: 12.9 % (ref 11.5–15.5)
WBC: 12.6 10*3/uL — AB (ref 4.0–10.5)

## 2017-12-24 LAB — URINALYSIS, ROUTINE W REFLEX MICROSCOPIC
Bacteria, UA: NONE SEEN
Bilirubin Urine: NEGATIVE
GLUCOSE, UA: NEGATIVE mg/dL
KETONES UR: 20 mg/dL — AB
Leukocytes, UA: NEGATIVE
Nitrite: NEGATIVE
PH: 5 (ref 5.0–8.0)
Protein, ur: NEGATIVE mg/dL
SPECIFIC GRAVITY, URINE: 1.029 (ref 1.005–1.030)

## 2017-12-24 LAB — COMPREHENSIVE METABOLIC PANEL
ALT: 17 U/L (ref 14–54)
AST: 16 U/L (ref 15–41)
Albumin: 4.6 g/dL (ref 3.5–5.0)
Alkaline Phosphatase: 65 U/L (ref 38–126)
Anion gap: 10 (ref 5–15)
BUN: 17 mg/dL (ref 6–20)
CALCIUM: 9.2 mg/dL (ref 8.9–10.3)
CO2: 25 mmol/L (ref 22–32)
CREATININE: 0.67 mg/dL (ref 0.44–1.00)
Chloride: 105 mmol/L (ref 101–111)
Glucose, Bld: 94 mg/dL (ref 65–99)
Potassium: 3.7 mmol/L (ref 3.5–5.1)
Sodium: 140 mmol/L (ref 135–145)
Total Bilirubin: 0.9 mg/dL (ref 0.3–1.2)
Total Protein: 8.4 g/dL — ABNORMAL HIGH (ref 6.5–8.1)

## 2017-12-24 LAB — I-STAT BETA HCG BLOOD, ED (MC, WL, AP ONLY): I-stat hCG, quantitative: <5 m[IU]/mL (ref <5)

## 2017-12-24 LAB — LIPASE, BLOOD: Lipase: 30 U/L (ref 11–51)

## 2017-12-24 MED ORDER — METOCLOPRAMIDE HCL 5 MG/ML IJ SOLN
10.0000 mg | Freq: Once | INTRAMUSCULAR | Status: AC
  Administered 2017-12-25: 10 mg via INTRAVENOUS
  Filled 2017-12-24: qty 2

## 2017-12-24 MED ORDER — FENTANYL CITRATE (PF) 100 MCG/2ML IJ SOLN
50.0000 ug | Freq: Once | INTRAMUSCULAR | Status: AC
  Administered 2017-12-25: 50 ug via INTRAVENOUS
  Filled 2017-12-24: qty 2

## 2017-12-24 MED ORDER — SODIUM CHLORIDE 0.9 % IV BOLUS (SEPSIS)
2000.0000 mL | Freq: Once | INTRAVENOUS | Status: AC
  Administered 2017-12-25: 2000 mL via INTRAVENOUS

## 2017-12-24 MED ORDER — DEXAMETHASONE SODIUM PHOSPHATE 10 MG/ML IJ SOLN
10.0000 mg | Freq: Once | INTRAMUSCULAR | Status: AC
  Administered 2017-12-25: 10 mg via INTRAVENOUS
  Filled 2017-12-24: qty 1

## 2017-12-24 NOTE — ED Triage Notes (Signed)
Patient c/o crohn's flare up and that she cannot stop throwing up since 730 this am. Reports given prescription for zofran that is not relieving nausea.

## 2017-12-24 NOTE — ED Provider Notes (Signed)
[facility] DEPT Provider Note   CSN: [id number] Arrival date & time: 12/24/17  1243     History   Chief Complaint Chief Complaint  Patient presents with  . Emesis    HPI Meghan Charles is a 47 y.o. female.  47 year old female with a history of Crohn's disease s/p colonic resection and laparoscopic lysis of adhesions presents to the ED for evaluation of abdominal pain. She states that her pain feels similar to prior exacerbations of abdominal pain. She notes right-sided abdominal pain which is "tender" and nonradiating. She has not been able to take anything for his symptoms secondary to nausea and vomiting. She has had approximately 8 episodes of emesis. She denies hematemesis. She has not had a bowel movement today and denies passing flatus. Her last bowel movement was 2 days ago.he has taken Zofran for her nausea without relief. She is actively followed by GI and is slated to start Humira, but needs to get her shingles shot first. She was last seen for these symptoms at the end of December with a negative CT scan. She has had 5 other CT scans this year for similar pain, all of which were negative.      Past Medical History:  Diagnosis Date  . Anxiety   . Colon obstruction ([place])   . Colon polyps 05/2014   TUBULAR ADENOMA AND HYPERPLASTIC POLYP.  . Crohn's disease ([place])   . Crohn's disease of small intestine with complication ([place])   . Gallbladder polyp   . Tubular adenoma of colon     Patient Active Problem List   Diagnosis Date Noted  . Encounter for therapeutic drug monitoring 08/10/2017  . Adalimumab (Humira) long-term use 08/10/2017  . Hypokalemia   . 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 RLQ abdominal pain   . Pollen allergies 03/26/2015  . Immunosuppressed status ([place]) 03/26/2015  . Exacerbation of Crohn's disease ([place])   . High risk medication use   . Diarrhea 01/18/2015  . Crohn's colitis ([place]) 12/26/2014  . Essential hypertension   .  CAP (community acquired pneumonia) 10/02/2014  . Tachycardia 10/02/2014  . Physical deconditioning 10/02/2014  . PNA (pneumonia) 09/24/2014  . Pleural effusion 09/24/2014  . Nausea with vomiting 09/24/2014  . Leukocytosis 09/24/2014  . Sinus tachycardia 09/24/2014  . Dehydration 09/24/2014  . Gastroesophageal reflux disease without esophagitis 09/01/2014  . Hx of adenomatous colonic polyps 09/01/2014  . Abdominal pain 11/05/2012  . 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 Nausea and vomiting in adult 11/05/2012  . Blood loss anemia 11/05/2012    Past Surgical History:  Procedure Laterality Date  . COLON RESECTION N/A 05/11/2015   Procedure: Laparotomy with resection of Chron's Disease small bowel resection biopsy mesenteric nodule;  Surgeon: Fanny Skates, MD;  Location: [facility];  Service: General;  Laterality: N/A;  . COLONOSCOPY W/ BIOPSIES  2015  . LAPAROSCOPIC LYSIS OF ADHESIONS  02/2017  . 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 OTHER SURGICAL HISTORY     heel surgery  . Right Heel surgery     with plates and screws  . wisdom teeth extracted      OB History    No data available       Home Medications    Prior to Admission medications   Medication Sig Start Date End Date Taking? Authorizing Provider  ibuprofen (ADVIL,MOTRIN) 200 MG tablet Take 800 mg by mouth every 4 (four) hours as needed for fever, headache, mild pain, moderate pain  or cramping.   Yes [provider]  temazepam (RESTORIL) 30 MG capsule Take 1 capsule (30 mg total) at bedtime as needed by mouth for sleep. Patient taking differently: Take 30 mg by mouth at bedtime.  10/24/17  Yes Pyrtle, Lajuan Lines, MD  HYDROcodone-acetaminophen (NORCO/VICODIN) 5-325 MG tablet Take 1 tablet by mouth every 6 (six) hours as needed. Patient not taking: Reported on 12/13/2017 10/02/17   Dalia Heading, PA-C  medroxyPROGESTERone (DEPO-PROVERA) 150 MG/ML injection Inject 150 mg into the muscle every 3 (three) months.     [provider]  metoCLOPramide (REGLAN) 10 MG tablet Take 1 tablet (10 mg total) by mouth every 8 (eight) hours as needed for nausea or vomiting. 12/25/17   Antonietta Breach, PA-C  ondansetron (ZOFRAN) 4 MG tablet Take 1 tablet (4 mg total) by mouth every 8 (eight) hours as needed for nausea or vomiting. Patient not taking: Reported on 12/24/2017 12/13/17   Tegeler, Gwenyth Allegra, MD  oxyCODONE-acetaminophen (PERCOCET/ROXICET) 5-325 MG tablet Take 1 tablet by mouth every 4 (four) hours as needed for severe pain. Patient not taking: Reported on 12/24/2017 12/13/17   Tegeler, Gwenyth Allegra, MD    Family History Family History  Problem Relation Age of Onset  . Colon cancer Maternal Grandmother 68  . Breast cancer Mother 85  . Esophageal cancer Neg Hx   . Stomach cancer Neg Hx   . Rectal cancer Neg Hx   . Pancreatic cancer Neg Hx     Social History Social History   Tobacco Use  . Smoking status: Never Smoker  . Smokeless tobacco: Never Used  Substance Use Topics  . Alcohol use: Yes    Alcohol/week: 0.0 oz    Comment: 3 times per week  . Drug use: No     Allergies   Morphine and related   Review of Systems Review of Systems Ten systems reviewed and are negative for acute change, except as noted in the HPI.    Physical Exam Updated Vital Signs BP (!) 151/99   Pulse 93   Temp 98 F (36.7 C) (Oral)   Resp 16   SpO2 98%   Physical Exam  Constitutional: She is oriented to person, place, and time. She appears well-developed and well-nourished. No distress.  Nontoxic-appearing  HENT:  Head: Normocephalic and atraumatic.  Eyes: Conjunctivae and EOM are normal. No scleral icterus.  Neck: Normal range of motion.  Cardiovascular: Normal rate, regular rhythm and intact distal pulses.  Patient not tachycardic as noted in triage  Pulmonary/Chest: Effort normal. No stridor. No respiratory distress.  Respirations even and unlabored  Abdominal:  Mild right upper quadrant and  right mid abdominal tenderness. Negative Murphy sign. No abdominal distention or peritoneal signs. Hypoactive bowel sounds.  Musculoskeletal: Normal range of motion.  Neurological: She is alert and oriented to person, place, and time. She exhibits normal muscle tone. Coordination normal.  GCS 15. Patient moving all extremities.  Skin: Skin is warm and dry. No rash noted. She is not diaphoretic. No erythema. No pallor.  Psychiatric: She has a normal mood and affect. Her behavior is normal.  Nursing note and vitals reviewed.    ED Treatments / Results  Labs (all labs ordered are listed, but only abnormal results are displayed) Labs Reviewed  COMPREHENSIVE METABOLIC PANEL - Abnormal; Notable for the following components:      Result Value   Total Protein 8.4 (*)    All other components within normal limits  CBC - Abnormal; Notable for  the following components:   WBC 12.6 (*)    All other components within normal limits  URINALYSIS, ROUTINE W REFLEX MICROSCOPIC - Abnormal; Notable for the following components:   APPearance HAZY (*)    Hgb urine dipstick MODERATE (*)    Ketones, ur 20 (*)    Squamous Epithelial / LPF 6-30 (*)    All other components within normal limits  LIPASE, BLOOD  RAPID URINE DRUG SCREEN, HOSP PERFORMED  I-STAT BETA HCG BLOOD, ED ([facility], [facility], [facility] ONLY)    EKG  EKG Interpretation None       Radiology Dg Abd 2 Views  Result Date: 12/24/2017 CLINICAL DATA:  Nausea and vomiting.  Abdominal pain. EXAM: ABDOMEN - 2 VIEW COMPARISON:  Most recent CT 12/13/2017 FINDINGS: Normal bowel gas pattern. No bowel dilatation to suggest obstruction. No evidence of free air. No radiographic evidence of bowel inflammation. Small volume of colonic stool. No radiopaque calculi. Lower most lung bases are clear. No acute osseous abnormality. IMPRESSION: Normal abdominal radiographs. Electronically Signed   By: Jeb Levering M.D.   On: 12/24/2017 23:44    Procedures Procedures  (including critical care time)  Medications Ordered in ED Medications  sodium chloride 0.9 % bolus 2,000 mL (0 mLs Intravenous Stopped 12/25/17 0226)  metoCLOPramide (REGLAN) injection 10 mg (10 mg Intravenous Given 12/25/17 0018)  dexamethasone (DECADRON) injection 10 mg (10 mg Intravenous Given 12/25/17 0019)  fentaNYL (SUBLIMAZE) injection 50 mcg (50 mcg Intravenous Given 12/25/17 0019)     Initial Impression / Assessment and Plan / ED Course  I have reviewed the triage vital signs and the nursing notes.  Pertinent labs & imaging results that were available during my care of the patient were reviewed by me and considered in my medical decision making (see chart for details).    57:65 AM 47 year old female with a history of Crohn's disease presents to the emergency department for right-sided abdominal pain with nausea and vomiting. She has been seen in the emergency department multiple times for similar symptoms and reports that her pain feels consistent with prior exacerbations of abdominal pain. She most recently experienced right lower quadrant pain at the end of December. She underwent a CT scan which was negative for any acute abdominal or pelvic process.  On chart review, the patient has had 6 negative CT scans in the past year for complaints of abdominal pain mostly in the right lower abdomen.laboratory workup was initiated in triage which shows a mild leukocytosis. Otherwise, workup consistent with baseline. No signs of acute surgical abdomen on exam today. Plan for abdominal series and symptom management.  12:40 AM  Patient reassessed. She states that her pain is greatly improved. Her abdominal repeat exam is improved as well. No peritoneal signs. X-ray shows a nonobstructive bowel gas pattern. Will continue to monitor.  1:55 AM Patient states that she continues to have well-controlled pain. Will attempt fluid challenge given reports of vomiting prior to arrival.  2:35 AM Patient has  been able to tolerate Sprite without difficulty. She states that she is feeling better and desires discharge. Low suspicion for emergent process at this time. I have advised close GI follow-up. Return precautions discussed and provided. Patient discharged in stable condition with no unaddressed concerns.   Final Clinical Impressions(s) / ED Diagnoses   Final diagnoses:  Nausea and vomiting  Abdominal pain    ED Discharge Orders        Ordered    metoCLOPramide (REGLAN) 10 MG  tablet  Every 8 hours PRN     12/25/17 0237       Antonietta Breach, PA-C 12/25/17 Jerrell Belfast, MD 12/25/17 [phone]

## 2017-12-25 ENCOUNTER — Telehealth: Payer: Self-pay | Admitting: Internal Medicine

## 2017-12-25 LAB — RAPID URINE DRUG SCREEN, HOSP PERFORMED
Amphetamines: NOT DETECTED
BENZODIAZEPINES: NOT DETECTED
Barbiturates: NOT DETECTED
Cocaine: NOT DETECTED
OPIATES: NOT DETECTED
Tetrahydrocannabinol: NOT DETECTED

## 2017-12-25 MED ORDER — TEMAZEPAM 30 MG PO CAPS: 30.0000 mg | ORAL_CAPSULE | Freq: Every evening | ORAL | 0 refills | Status: DC | PRN

## 2017-12-25 MED ORDER — METOCLOPRAMIDE HCL 10 MG PO TABS: 10.0000 mg | ORAL_TABLET | Freq: Three times a day (TID) | ORAL | 0 refills | Status: DC | PRN

## 2017-12-25 NOTE — Telephone Encounter (Signed)
Rx sent. Patient must have office visit for any further refills.

## 2018-01-25 ENCOUNTER — Telehealth: Payer: Self-pay | Admitting: Internal Medicine

## 2018-01-25 MED ORDER — TEMAZEPAM 30 MG PO CAPS: 30.0000 mg | ORAL_CAPSULE | Freq: Every evening | ORAL | 1 refills | Status: DC | PRN

## 2018-01-25 NOTE — Telephone Encounter (Signed)
Patient advised she needs office appointment for further refills. She has scheduled appointment for Dr Meghan Charles first available 04/02/18 at 3pm. I advised that we will send enough medication until that time. She verbalizes understanding. Rx sent to pharmacy.

## 2018-01-25 NOTE — Telephone Encounter (Signed)
Patient requesting her sleep medication be refilled at [facility] .

## 2018-03-08 ENCOUNTER — Emergency Department (HOSPITAL_COMMUNITY): Payer: 59

## 2018-03-08 ENCOUNTER — Encounter (HOSPITAL_COMMUNITY): Payer: Self-pay

## 2018-03-08 ENCOUNTER — Emergency Department (HOSPITAL_COMMUNITY)
Admission: EM | Admit: 2018-03-08 | Discharge: 2018-03-08 | Disposition: A | Discharge status: Home / Self Care | Payer: 59 | Attending: Emergency Medicine | Admitting: Emergency Medicine

## 2018-03-08 ENCOUNTER — Other Ambulatory Visit: Payer: Self-pay

## 2018-03-08 DIAGNOSIS — I10 Essential (primary) hypertension: Secondary | ICD-10-CM | POA: Insufficient documentation

## 2018-03-08 DIAGNOSIS — R112 Nausea with vomiting, unspecified: Secondary | ICD-10-CM | POA: Diagnosis not present

## 2018-03-08 DIAGNOSIS — R1084 Generalized abdominal pain: Secondary | ICD-10-CM | POA: Diagnosis present

## 2018-03-08 LAB — URINALYSIS, ROUTINE W REFLEX MICROSCOPIC
Bilirubin Urine: NEGATIVE
Glucose, UA: NEGATIVE mg/dL
Ketones, ur: 5 mg/dL — AB
Leukocytes, UA: NEGATIVE
Nitrite: NEGATIVE
PROTEIN: 30 mg/dL — AB
Specific Gravity, Urine: 1.026 (ref 1.005–1.030)
pH: 6 (ref 5.0–8.0)

## 2018-03-08 LAB — CBC
HEMATOCRIT: 40.6 % (ref 36.0–46.0)
HEMOGLOBIN: 13.3 g/dL (ref 12.0–15.0)
MCH: 30.6 pg (ref 26.0–34.0)
MCHC: 32.8 g/dL (ref 30.0–36.0)
MCV: 93.5 fL (ref 78.0–100.0)
Platelets: 333 10*3/uL (ref 150–400)
RBC: 4.34 MIL/uL (ref 3.87–5.11)
RDW: 12.9 % (ref 11.5–15.5)
WBC: 7.8 10*3/uL (ref 4.0–10.5)

## 2018-03-08 LAB — COMPREHENSIVE METABOLIC PANEL
ALK PHOS: 61 U/L (ref 38–126)
ALT: 17 U/L (ref 14–54)
ANION GAP: 9 (ref 5–15)
AST: 17 U/L (ref 15–41)
Albumin: 4.5 g/dL (ref 3.5–5.0)
BUN: 12 mg/dL (ref 6–20)
CALCIUM: 9.5 mg/dL (ref 8.9–10.3)
CO2: 27 mmol/L (ref 22–32)
Chloride: 105 mmol/L (ref 101–111)
Creatinine, Ser: 0.69 mg/dL (ref 0.44–1.00)
GFR calc Af Amer: >60 mL/min (ref >60)
GFR calc non Af Amer: >60 mL/min (ref >60)
Glucose, Bld: 111 mg/dL — ABNORMAL HIGH (ref 65–99)
Potassium: 3.8 mmol/L (ref 3.5–5.1)
Sodium: 141 mmol/L (ref 135–145)
Total Bilirubin: 0.6 mg/dL (ref 0.3–1.2)
Total Protein: 8 g/dL (ref 6.5–8.1)

## 2018-03-08 LAB — I-STAT BETA HCG BLOOD, ED (MC, WL, AP ONLY)

## 2018-03-08 LAB — LIPASE, BLOOD: Lipase: 31 U/L (ref 11–51)

## 2018-03-08 MED ORDER — FENTANYL CITRATE (PF) 100 MCG/2ML IJ SOLN
50.0000 ug | Freq: Once | INTRAMUSCULAR | Status: AC
  Administered 2018-03-08: 50 ug via INTRAVENOUS
  Filled 2018-03-08: qty 2

## 2018-03-08 MED ORDER — PROMETHAZINE HCL 25 MG/ML IJ SOLN
25.0000 mg | Freq: Once | INTRAMUSCULAR | Status: AC
  Administered 2018-03-08: 25 mg via INTRAVENOUS
  Filled 2018-03-08: qty 1

## 2018-03-08 MED ORDER — SODIUM CHLORIDE 0.9 % IV BOLUS (SEPSIS)
1000.0000 mL | Freq: Once | INTRAVENOUS | Status: AC
  Administered 2018-03-08: 1000 mL via INTRAVENOUS

## 2018-03-08 MED ORDER — PROMETHAZINE HCL 25 MG PO TABS: 25.0000 mg | ORAL_TABLET | Freq: Four times a day (QID) | ORAL | 0 refills | Status: DC | PRN

## 2018-03-08 NOTE — ED Triage Notes (Signed)
Patient c/o mid abdominal pain and N/V since yesterday. Patient has a history of Crohn's. Patient refused Zofran ODT when offered.

## 2018-03-08 NOTE — ED Provider Notes (Signed)
[facility] DEPT Provider Note   CSN: [id number] Arrival date & time: 03/08/18  1533     History   Chief Complaint Chief Complaint  Patient presents with  . Abdominal Pain  . Emesis    HPI Meghan Charles is a 47 y.o. female with a history of anxiety and Crohn's disease who presents to the emergency department with a chief complaint of nausea, vomiting, diarrhea, and abdominal pain.  She reports constant vomiting that began yesterday with one episode of NBNB last night.  She reports the nausea continued today and she had 5 episodes of emesis and one episode of nonbloody diarrhea while she was at work.  She states that it is unusual for her to have nausea for more than 1 day.  She also endorses constant, moderate, crampy, non-radiating right lower quadrant abdominal pain that began today and productive cough and nasal congestion over the last few days.  No fever, chills, melena, hematochezia, dysuria, vaginal pain or discharge, back pain.   She is scheduled for a follow-up appointment on 4/16 to start Humira.  No treatment prior to arrival.  She is a non-smoker.  The history is provided by the patient. No language interpreter was used.    Past Medical History:  Diagnosis Date  . Anxiety   . Colon obstruction ([place])   . Colon polyps 05/2014   TUBULAR ADENOMA AND HYPERPLASTIC POLYP.  . Crohn's disease ([place])   . Crohn's disease of small intestine with complication ([place])   . Gallbladder polyp   . Tubular adenoma of colon     Patient Active Problem List   Diagnosis Date Noted  . Encounter for therapeutic drug monitoring 08/10/2017  . Adalimumab (Humira) long-term use 08/10/2017  . Hypokalemia   . 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 RLQ abdominal pain   . Pollen allergies 03/26/2015  . Immunosuppressed status ([place]) 03/26/2015  . Exacerbation of Crohn's disease ([place])   . High risk medication use   . Diarrhea 01/18/2015  . Crohn's colitis ([place])  12/26/2014  . Essential hypertension   . CAP (community acquired pneumonia) 10/02/2014  . Tachycardia 10/02/2014  . Physical deconditioning 10/02/2014  . PNA (pneumonia) 09/24/2014  . Pleural effusion 09/24/2014  . Nausea with vomiting 09/24/2014  . Leukocytosis 09/24/2014  . Sinus tachycardia 09/24/2014  . Dehydration 09/24/2014  . Gastroesophageal reflux disease without esophagitis 09/01/2014  . Hx of adenomatous colonic polyps 09/01/2014  . Abdominal pain 11/05/2012  . 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 Nausea and vomiting in adult 11/05/2012  . Blood loss anemia 11/05/2012    Past Surgical History:  Procedure Laterality Date  . COLON RESECTION N/A 05/11/2015   Procedure: Laparotomy with resection of Chron's Disease small bowel resection biopsy mesenteric nodule;  Surgeon: Fanny Skates, MD;  Location: [facility];  Service: General;  Laterality: N/A;  . COLONOSCOPY W/ BIOPSIES  2015  . LAPAROSCOPIC LYSIS OF ADHESIONS  02/2017  . 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 OTHER SURGICAL HISTORY     heel surgery  . Right Heel surgery     with plates and screws  . wisdom teeth extracted       OB History   None      Home Medications    Prior to Admission medications   Medication Sig Start Date End Date Taking? Authorizing Provider  temazepam (RESTORIL) 15 MG capsule Take 15 mg by mouth at bedtime as needed for sleep.  03/01/18  Yes [provider]  medroxyPROGESTERone (DEPO-PROVERA) 150 MG/ML injection Inject 150 mg into the muscle every 3 (three) months.    [provider]  promethazine (PHENERGAN) 25 MG tablet Take 1 tablet (25 mg total) by mouth every 6 (six) hours as needed for nausea or vomiting. 03/08/18   Catrell Morrone A, PA-C    Family History Family History  Problem Relation Age of Onset  . Colon cancer Maternal Grandmother 20  . Breast cancer Mother 62  . Esophageal cancer Neg Hx   . Stomach cancer Neg Hx   . Rectal cancer Neg Hx   .  Pancreatic cancer Neg Hx     Social History Social History   Tobacco Use  . Smoking status: Never Smoker  . Smokeless tobacco: Never Used  Substance Use Topics  . Alcohol use: Yes    Alcohol/week: 0.0 oz    Comment: 3 times per week  . Drug use: No     Allergies   Morphine and related   Review of Systems Review of Systems  Constitutional: Negative for activity change, chills and fever.  HENT: Positive for congestion. Negative for sore throat.   Respiratory: Positive for cough. Negative for shortness of breath.   Cardiovascular: Negative for chest pain.  Gastrointestinal: Positive for abdominal pain, diarrhea, nausea and vomiting. Negative for anal bleeding, blood in stool and constipation.  Genitourinary: Negative for dysuria, hematuria, vaginal bleeding and vaginal pain.  Musculoskeletal: Negative for back pain, neck pain and neck stiffness.  Skin: Negative for rash.  Allergic/Immunologic: Positive for immunocompromised state.  Neurological: Negative for dizziness, weakness, light-headedness, numbness and headaches.     Physical Exam Updated Vital Signs BP (!) 132/93 (BP Location: Left Arm)   Pulse 92   Temp 98.2 F (36.8 C) (Oral)   Resp 18   Ht 5\' 8"  (1.727 m)   Wt 66.7 kg (147 lb)   SpO2 100%   BMI 22.35 kg/m   Physical Exam  Constitutional: She is oriented to person, place, and time. She appears well-developed and well-nourished. No distress.  HENT:  Head: Normocephalic.  Right Ear: External ear normal.  Left Ear: External ear normal.  Nose: No rhinorrhea.  Mouth/Throat: Uvula is midline and oropharynx is clear and moist.  Eyes: Conjunctivae are normal.  Neck: Normal range of motion. Neck supple.  Cardiovascular: Regular rhythm, normal heart sounds and intact distal pulses. Tachycardia present. Exam reveals no gallop and no friction rub.  No murmur heard. Pulmonary/Chest: Effort normal. No stridor. No respiratory distress. She has no wheezes. She has  no rales. She exhibits no tenderness.  Abdominal: Soft. Bowel sounds are normal. She exhibits no distension and no mass. There is no splenomegaly or hepatomegaly. There is tenderness in the right lower quadrant. There is no rebound, no guarding, no CVA tenderness, no tenderness at McBurney's point and negative Murphy's sign. No hernia.  Neurological: She is alert and oriented to person, place, and time.  Skin: Skin is warm. Capillary refill takes less than 2 seconds. No rash noted. She is not diaphoretic. No erythema. No pallor.  Psychiatric: Her behavior is normal.  Nursing note and vitals reviewed.  ED Treatments / Results  Labs (all labs ordered are listed, but only abnormal results are displayed) Labs Reviewed  COMPREHENSIVE METABOLIC PANEL - Abnormal; Notable for the following components:      Result Value   Glucose, Bld 111 (*)    All other components within normal limits  URINALYSIS, ROUTINE W REFLEX MICROSCOPIC - Abnormal; Notable for the  following components:   Hgb urine dipstick MODERATE (*)    Ketones, ur 5 (*)    Protein, ur 30 (*)    Bacteria, UA RARE (*)    Squamous Epithelial / LPF 0-5 (*)    All other components within normal limits  LIPASE, BLOOD  CBC  I-STAT BETA HCG BLOOD, ED ([facility], [facility], [facility] ONLY)    EKG None  Radiology Dg Abdomen Acute W/chest  Result Date: 03/08/2018 CLINICAL DATA:  Mid abdominal pain with nausea and vomiting since yesterday. History of Crohn's disease. EXAM: DG ABDOMEN ACUTE W/ 1V CHEST COMPARISON:  Radiographs 12/24/2017 and 07/03/2017.  CT 12/13/2017. FINDINGS: There is stable chronic biapical scarring with superior hilar retraction. No airspace disease, edema, pleural effusion or pneumothorax demonstrated. The heart size and mediastinal contours are stable. The bowel gas pattern is normal. There is no free intraperitoneal air or suspicious calcification. Small pelvic calcifications are stable, likely phleboliths. The sacroiliac joints appear  normal. IMPRESSION: No acute abdominal findings. Chronic lung disease with upper lobe scarring and superior hilar retraction. Electronically Signed   By: Richardean Sale M.D.   On: 03/08/2018 19:11    Procedures Procedures (including critical care time)  Medications Ordered in ED Medications  promethazine (PHENERGAN) injection 25 mg (25 mg Intravenous Given 03/08/18 1908)  sodium chloride 0.9 % bolus 1,000 mL (1,000 mLs Intravenous New Bag/Given 03/08/18 1908)  fentaNYL (SUBLIMAZE) injection 50 mcg (50 mcg Intravenous Given 03/08/18 1908)     Initial Impression / Assessment and Plan / ED Course  I have reviewed the triage vital signs and the nursing notes.  Pertinent labs & imaging results that were available during my care of the patient were reviewed by me and considered in my medical decision making (see chart for details).     47 year old female with a history of diarrhea and Crohn's disease presenting with nausea, vomiting, diarrhea, abdominal pain since yesterday with cough and nasal congestion over the last few days.  Mild ketonuria on urinalysis, otherwise labs are unremarkable.  Abdominal x-ray is unremarkable.  IV fluid bolus and Phenergan given.  Pain controlled with fentanyl.  She was successfully fluid challenged.  Will discharge the patient home with Rx for Phenergan and follow-up with gastroenterology.  It is possible this could be an early presentation of a Crohn's flare versus viral gastroenteritis.  Doubt appendicitis, ovarian torsion, pyelonephritis, colitis, cholecystitis.  She is hemodynamically stable; initial tachycardia has since resolved after IV fluids.  She is in no acute distress and is safe for discharge home with outpatient follow-up at this time.  Final Clinical Impressions(s) / ED Diagnoses   Final diagnoses:  Generalized abdominal pain  Non-intractable vomiting with nausea, unspecified vomiting type    ED Discharge Orders        Ordered    promethazine  (PHENERGAN) 25 MG tablet  Every 6 hours PRN     03/08/18 2019       Timisha Mondry A, PA-C 03/08/18 2036    Margette Fast, MD 03/09/18 [phone]

## 2018-03-08 NOTE — Discharge Instructions (Addendum)
Take 1 tablet of Phenergan every 6 hours as needed for nausea and vomiting.   Call to schedule follow-up appointment with Dr. Hilarie Fredrickson if your abdominal pain persists.  650 mg of Tylenol once every 6 hours for pain control.  If you develop new or worsening symptoms including vomiting despite taking Phenergan, high fever that does not improve with Tylenol, blood in your stool, or other new concerning symptoms, please return to the emergency department for reevaluation.

## 2018-03-16 ENCOUNTER — Encounter (HOSPITAL_COMMUNITY): Payer: Self-pay | Admitting: Emergency Medicine

## 2018-03-16 ENCOUNTER — Emergency Department (HOSPITAL_COMMUNITY)
Admission: EM | Admit: 2018-03-16 | Discharge: 2018-03-17 | Disposition: A | Discharge status: Home / Self Care | Payer: 59 | Attending: Emergency Medicine | Admitting: Emergency Medicine

## 2018-03-16 DIAGNOSIS — R1110 Vomiting, unspecified: Secondary | ICD-10-CM | POA: Diagnosis not present

## 2018-03-16 DIAGNOSIS — R197 Diarrhea, unspecified: Secondary | ICD-10-CM | POA: Insufficient documentation

## 2018-03-16 DIAGNOSIS — R112 Nausea with vomiting, unspecified: Secondary | ICD-10-CM

## 2018-03-16 DIAGNOSIS — Z79899 Other long term (current) drug therapy: Secondary | ICD-10-CM | POA: Insufficient documentation

## 2018-03-16 DIAGNOSIS — R000 Tachycardia, unspecified: Secondary | ICD-10-CM | POA: Insufficient documentation

## 2018-03-16 DIAGNOSIS — R1084 Generalized abdominal pain: Secondary | ICD-10-CM | POA: Insufficient documentation

## 2018-03-16 LAB — COMPREHENSIVE METABOLIC PANEL
ALT: 21 U/L (ref 14–54)
ANION GAP: 13 (ref 5–15)
AST: 21 U/L (ref 15–41)
Albumin: 4.1 g/dL (ref 3.5–5.0)
Alkaline Phosphatase: 60 U/L (ref 38–126)
BUN: 19 mg/dL (ref 6–20)
CHLORIDE: 107 mmol/L (ref 101–111)
CO2: 19 mmol/L — AB (ref 22–32)
CREATININE: 0.87 mg/dL (ref 0.44–1.00)
Calcium: 8.8 mg/dL — ABNORMAL LOW (ref 8.9–10.3)
Glucose, Bld: 142 mg/dL — ABNORMAL HIGH (ref 65–99)
POTASSIUM: 3.5 mmol/L (ref 3.5–5.1)
SODIUM: 139 mmol/L (ref 135–145)
Total Bilirubin: 0.8 mg/dL (ref 0.3–1.2)
Total Protein: 7.9 g/dL (ref 6.5–8.1)

## 2018-03-16 LAB — LIPASE, BLOOD: LIPASE: 30 U/L (ref 11–51)

## 2018-03-16 LAB — CBC
HCT: 42.3 % (ref 36.0–46.0)
HEMOGLOBIN: 14.6 g/dL (ref 12.0–15.0)
MCH: 31.5 pg (ref 26.0–34.0)
MCHC: 34.5 g/dL (ref 30.0–36.0)
MCV: 91.2 fL (ref 78.0–100.0)
PLATELETS: 261 10*3/uL (ref 150–400)
RBC: 4.64 MIL/uL (ref 3.87–5.11)
RDW: 12.6 % (ref 11.5–15.5)
WBC: 13.8 10*3/uL — ABNORMAL HIGH (ref 4.0–10.5)

## 2018-03-16 LAB — I-STAT BETA HCG BLOOD, ED (MC, WL, AP ONLY): I-stat hCG, quantitative: <5 m[IU]/mL (ref <5)

## 2018-03-16 MED ORDER — SODIUM CHLORIDE 0.9 % IV BOLUS
1000.0000 mL | Freq: Once | INTRAVENOUS | Status: AC
  Administered 2018-03-16: 1000 mL via INTRAVENOUS

## 2018-03-16 MED ORDER — FENTANYL CITRATE (PF) 100 MCG/2ML IJ SOLN
50.0000 ug | Freq: Once | INTRAMUSCULAR | Status: AC
  Administered 2018-03-16: 50 ug via INTRAVENOUS
  Filled 2018-03-16: qty 2

## 2018-03-16 MED ORDER — ONDANSETRON 4 MG PO TBDP
4.0000 mg | ORAL_TABLET | Freq: Once | ORAL | Status: AC | PRN
  Administered 2018-03-16: 4 mg via ORAL
  Filled 2018-03-16: qty 1

## 2018-03-16 MED ORDER — PROMETHAZINE HCL 25 MG/ML IJ SOLN
25.0000 mg | Freq: Once | INTRAMUSCULAR | Status: AC
  Administered 2018-03-16: 25 mg via INTRAVENOUS
  Filled 2018-03-16: qty 1

## 2018-03-16 NOTE — ED Provider Notes (Signed)
[facility] DEPT Provider Note: Georgena Spurling, MD, FACEP  CSN: [id number] MRN: [id number] ARRIVAL: 03/16/18 at 2110 ROOM: [place]  Abdominal Pain   HISTORY OF PRESENT ILLNESS  03/16/18 10:57 PM Meghan Charles is a 47 y.o. female with a history of Crohn's disease and multiple visits for abdominal pain, vomiting and diarrhea. Her two most recent CT scans showed no active Crohn's. She is here with nausea, vomiting and diarrhea that began about 6:00 this morning.  Symptoms worsened throughout the day.  She tried taking Phenergan this afternoon but threw it back up.  She is also having abdominal pain.  She describes the pain as tight or full sensation in her lower abdomen.  She rates it as a 9 out of 10.  Pain is worse with movement or palpation.  She was noted to be tachycardic with a heart rate of 147 on arrival; previous EKGs have shown tachycardia as well.  She was given a 1 L IV fluid bolus prior to my evaluation with her heart rate improving to 125.  She was given Zofran ODT without relief of her nausea.    Past Medical History:  Diagnosis Date  . Anxiety   . Colon obstruction ([place])   . Colon polyps 05/2014   TUBULAR ADENOMA AND HYPERPLASTIC POLYP.  . Crohn's disease of small intestine with complication ([place])   . Gallbladder polyp   . Tubular adenoma of colon     Past Surgical History:  Procedure Laterality Date  . COLON RESECTION N/A 05/11/2015   Procedure: Laparotomy with resection of Chron's Disease small bowel resection biopsy mesenteric nodule;  Surgeon: Fanny Skates, MD;  Location: [facility];  Service: General;  Laterality: N/A;  . COLONOSCOPY W/ BIOPSIES  2015  . LAPAROSCOPIC LYSIS OF ADHESIONS  02/2017  . 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 Right Heel surgery     with plates and screws  . wisdom teeth extracted      Family History  Problem Relation Age of Onset  . Colon cancer Maternal Grandmother 76  . Breast cancer Mother 21  . Esophageal cancer  Neg Hx   . Stomach cancer Neg Hx   . Rectal cancer Neg Hx   . Pancreatic cancer Neg Hx     Social History   Tobacco Use  . Smoking status: Never Smoker  . Smokeless tobacco: Never Used  Substance Use Topics  . Alcohol use: Yes    Alcohol/week: 0.0 oz    Comment: 3 times per week  . Drug use: No    Prior to Admission medications   Medication Sig Start Date End Date Taking? Authorizing Provider  aspirin 325 MG EC tablet Take 325 mg by mouth every 6 (six) hours as needed for pain.   Yes [provider]  medroxyPROGESTERone (DEPO-PROVERA) 150 MG/ML injection Inject 150 mg into the muscle every 3 (three) months.   Yes [provider]  promethazine (PHENERGAN) 25 MG tablet Take 1 tablet (25 mg total) by mouth every 6 (six) hours as needed for nausea or vomiting. 03/08/18  Yes McDonald, Mia A, PA-C  temazepam (RESTORIL) 15 MG capsule Take 15 mg by mouth at bedtime as needed for sleep.  03/01/18  Yes [provider]    Allergies Morphine and related   REVIEW OF SYSTEMS  Negative except as noted here or in the History of Present Illness.   PHYSICAL EXAMINATION  Initial Vital Signs Blood pressure (!) 133/108, pulse (!) 147, temperature 97.7  F (36.5 C), temperature source Oral, resp. rate 16, SpO2 100 %.  Examination General: Well-developed, well-nourished female in no acute distress; appearance consistent with age of record HENT: normocephalic; atraumatic Eyes: pupils equal, round and reactive to light; extraocular muscles intact Neck: supple Heart: regular rate and rhythm; tachycardia Lungs: clear to auscultation bilaterally Abdomen: soft; nondistended; lower abdominal tenderness; bowel sounds hypoactive Extremities: No deformity; full range of motion; pulses normal Neurologic: Awake, alert and oriented; motor function intact in all extremities and symmetric; no facial droop Skin: Warm and dry Psychiatric: Normal mood and affect   RESULTS    Summary of this visit's results, reviewed by myself:   EKG Interpretation  Date/Time:  Saturday March 16 2018 22:12:33 EDT Ventricular Rate:  146 PR Interval:    QRS Duration: 68 QT Interval:  256 QTC Calculation: 399 R Axis:   66 Text Interpretation:  Sinus tachycardia Repol abnrm suggests ischemia, diffuse leads Baseline wander in lead(s) V3 V6 Rate is faster Confirmed by Deslyn Cavenaugh [phone]) on 03/16/2018 10:46:17 PM      Laboratory Studies: Results for orders placed or performed during the hospital encounter of 03/16/18 (from the past 24 hour(s))  Lipase, blood     Status: None   Collection Time: 03/16/18 10:26 PM  Result Value Ref Range   Lipase 30 11 - 51 U/L  Comprehensive metabolic panel     Status: Abnormal   Collection Time: 03/16/18 10:26 PM  Result Value Ref Range   Sodium 139 135 - 145 mmol/L   Potassium 3.5 3.5 - 5.1 mmol/L   Chloride 107 101 - 111 mmol/L   CO2 19 (L) 22 - 32 mmol/L   Glucose, Bld 142 (H) 65 - 99 mg/dL   BUN 19 6 - 20 mg/dL   Creatinine, Ser 0.87 0.44 - 1.00 mg/dL   Calcium 8.8 (L) 8.9 - 10.3 mg/dL   Total Protein 7.9 6.5 - 8.1 g/dL   Albumin 4.1 3.5 - 5.0 g/dL   AST 21 15 - 41 U/L   ALT 21 14 - 54 U/L   Alkaline Phosphatase 60 38 - 126 U/L   Total Bilirubin 0.8 0.3 - 1.2 mg/dL   GFR calc non Af Amer >60 >60 mL/min   GFR calc Af Amer >60 >60 mL/min   Anion gap 13 5 - 15  CBC     Status: Abnormal   Collection Time: 03/16/18 10:26 PM  Result Value Ref Range   WBC 13.8 (H) 4.0 - 10.5 K/uL   RBC 4.64 3.87 - 5.11 MIL/uL   Hemoglobin 14.6 12.0 - 15.0 g/dL   HCT 42.3 36.0 - 46.0 %   MCV 91.2 78.0 - 100.0 fL   MCH 31.5 26.0 - 34.0 pg   MCHC 34.5 30.0 - 36.0 g/dL   RDW 12.6 11.5 - 15.5 %   Platelets 261 150 - 400 K/uL  Urinalysis, Routine w reflex microscopic     Status: Abnormal   Collection Time: 03/16/18 10:26 PM  Result Value Ref Range   Color, Urine YELLOW YELLOW   APPearance CLEAR CLEAR   Specific Gravity, Urine 1.023 1.005 -  1.030   pH 6.0 5.0 - 8.0   Glucose, UA NEGATIVE NEGATIVE mg/dL   Hgb urine dipstick MODERATE (A) NEGATIVE   Bilirubin Urine NEGATIVE NEGATIVE   Ketones, ur NEGATIVE NEGATIVE mg/dL   Protein, ur NEGATIVE NEGATIVE mg/dL   Nitrite NEGATIVE NEGATIVE   Leukocytes, UA NEGATIVE NEGATIVE   RBC / HPF 0-5 0 - 5  RBC/hpf   WBC, UA 0-5 0 - 5 WBC/hpf   Bacteria, UA NONE SEEN NONE SEEN   Squamous Epithelial / LPF 0-5 (A) NONE SEEN   Mucus PRESENT   I-Stat beta hCG blood, ED     Status: None   Collection Time: 03/16/18 10:32 PM  Result Value Ref Range   I-stat hCG, quantitative <5.0 <5 mIU/mL   Comment 3           Imaging Studies: No results found.  ED COURSE  Nursing notes and initial vitals signs, including pulse oximetry, reviewed.  Vitals:   03/17/18 0030 03/17/18 0100 03/17/18 0115 03/17/18 0130  BP: (!) 131/94 110/68 (!) 129/98 135/90  Pulse: (!) 119 (!) 124 (!) 121 (!) 120  Resp:      Temp:      TempSrc:      SpO2: 98% 99% 100% 98%   3:11 AM Patient had little relief with Phenergan but is now drinking fluids without emesis after Reglan.  Her pain is well controlled as well.  She has had multiple visits to the ED for similar symptoms.  Although cannabis associated hyperemesis syndrome is in the differential diagnosis she has tested negative for cannabis in the past and denies cannabis use.  PROCEDURES    ED DIAGNOSES     ICD-10-CM   1. Generalized abdominal pain R10.84   2. Nausea and vomiting in adult R11.2        Shanon Rosser, MD 03/17/18 [phone]

## 2018-03-16 NOTE — ED Triage Notes (Signed)
Pt reports abd pain, n/v, diarrhea since this AM. Pt believe she may have eaten something last night that excerabated this. Pt has a hx of Crohns. Pts HR in triage 147. Pt denies cardiac hx.

## 2018-03-17 LAB — URINALYSIS, ROUTINE W REFLEX MICROSCOPIC
Bacteria, UA: NONE SEEN
Bilirubin Urine: NEGATIVE
GLUCOSE, UA: NEGATIVE mg/dL
Ketones, ur: NEGATIVE mg/dL
LEUKOCYTES UA: NEGATIVE
Nitrite: NEGATIVE
PH: 6 (ref 5.0–8.0)
Protein, ur: NEGATIVE mg/dL
SPECIFIC GRAVITY, URINE: 1.023 (ref 1.005–1.030)

## 2018-03-17 MED ORDER — METOCLOPRAMIDE HCL 10 MG PO TABS: 10.0000 mg | ORAL_TABLET | Freq: Four times a day (QID) | ORAL | 0 refills | Status: DC | PRN

## 2018-03-17 MED ORDER — METOCLOPRAMIDE HCL 5 MG/ML IJ SOLN
10.0000 mg | Freq: Once | INTRAMUSCULAR | Status: AC
Start: 2018-03-17 — End: 2018-03-17
  Administered 2018-03-17: 10 mg via INTRAVENOUS
  Filled 2018-03-17: qty 2

## 2018-03-17 NOTE — ED Notes (Signed)
Pt. Unable to tolerate PO fluid intake. Still nauseous and vomited x1.

## 2018-04-02 ENCOUNTER — Ambulatory Visit: Payer: 59 | Admitting: Internal Medicine

## 2018-05-13 ENCOUNTER — Emergency Department (HOSPITAL_COMMUNITY): Payer: 59

## 2018-05-13 ENCOUNTER — Other Ambulatory Visit: Payer: Self-pay

## 2018-05-13 ENCOUNTER — Emergency Department (HOSPITAL_COMMUNITY)
Admission: EM | Admit: 2018-05-13 | Discharge: 2018-05-13 | Disposition: A | Discharge status: Home / Self Care | Payer: 59 | Attending: Emergency Medicine | Admitting: Emergency Medicine

## 2018-05-13 ENCOUNTER — Encounter (HOSPITAL_COMMUNITY): Payer: Self-pay | Admitting: *Deleted

## 2018-05-13 DIAGNOSIS — R1084 Generalized abdominal pain: Secondary | ICD-10-CM

## 2018-05-13 DIAGNOSIS — I10 Essential (primary) hypertension: Secondary | ICD-10-CM | POA: Diagnosis not present

## 2018-05-13 LAB — CBC
HCT: 44.1 % (ref 36.0–46.0)
HEMOGLOBIN: 14.7 g/dL (ref 12.0–15.0)
MCH: 31.3 pg (ref 26.0–34.0)
MCHC: 33.3 g/dL (ref 30.0–36.0)
MCV: 93.8 fL (ref 78.0–100.0)
Platelets: 298 10*3/uL (ref 150–400)
RBC: 4.7 MIL/uL (ref 3.87–5.11)
RDW: 13.2 % (ref 11.5–15.5)
WBC: 11.4 10*3/uL — ABNORMAL HIGH (ref 4.0–10.5)

## 2018-05-13 LAB — COMPREHENSIVE METABOLIC PANEL
ALBUMIN: 4.8 g/dL (ref 3.5–5.0)
ALT: 23 U/L (ref 14–54)
ANION GAP: 12 (ref 5–15)
AST: 18 U/L (ref 15–41)
Alkaline Phosphatase: 58 U/L (ref 38–126)
BILIRUBIN TOTAL: 0.5 mg/dL (ref 0.3–1.2)
BUN: 16 mg/dL (ref 6–20)
CHLORIDE: 107 mmol/L (ref 101–111)
CO2: 23 mmol/L (ref 22–32)
Calcium: 9.7 mg/dL (ref 8.9–10.3)
Creatinine, Ser: 0.67 mg/dL (ref 0.44–1.00)
GFR calc Af Amer: >60 mL/min (ref >60)
GFR calc non Af Amer: >60 mL/min (ref >60)
Glucose, Bld: 112 mg/dL — ABNORMAL HIGH (ref 65–99)
POTASSIUM: 4 mmol/L (ref 3.5–5.1)
SODIUM: 142 mmol/L (ref 135–145)
TOTAL PROTEIN: 8.7 g/dL — AB (ref 6.5–8.1)

## 2018-05-13 LAB — URINALYSIS, ROUTINE W REFLEX MICROSCOPIC
Bacteria, UA: NONE SEEN
Bilirubin Urine: NEGATIVE
Glucose, UA: NEGATIVE mg/dL
Ketones, ur: 5 mg/dL — AB
Leukocytes, UA: NEGATIVE
NITRITE: NEGATIVE
PROTEIN: NEGATIVE mg/dL
SPECIFIC GRAVITY, URINE: 1.021 (ref 1.005–1.030)
pH: 5 (ref 5.0–8.0)

## 2018-05-13 LAB — I-STAT BETA HCG BLOOD, ED (MC, WL, AP ONLY)

## 2018-05-13 LAB — LIPASE, BLOOD: Lipase: 38 U/L (ref 11–51)

## 2018-05-13 MED ORDER — PROMETHAZINE HCL 25 MG PO TABS: 25.0000 mg | ORAL_TABLET | Freq: Four times a day (QID) | ORAL | 0 refills | Status: DC | PRN

## 2018-05-13 MED ORDER — SODIUM CHLORIDE 0.9 % IV BOLUS
1000.0000 mL | Freq: Once | INTRAVENOUS | Status: AC
  Administered 2018-05-13: 1000 mL via INTRAVENOUS

## 2018-05-13 MED ORDER — PROMETHAZINE HCL 25 MG/ML IJ SOLN
25.0000 mg | Freq: Once | INTRAMUSCULAR | Status: AC
  Administered 2018-05-13: 25 mg via INTRAVENOUS
  Filled 2018-05-13: qty 1

## 2018-05-13 MED ORDER — IOPAMIDOL (ISOVUE-300) INJECTION 61%
100.0000 mL | Freq: Once | INTRAVENOUS | Status: AC | PRN
  Administered 2018-05-13: 100 mL via INTRAVENOUS

## 2018-05-13 MED ORDER — IOHEXOL 300 MG/ML  SOLN
30.0000 mL | Freq: Once | INTRAMUSCULAR | Status: AC | PRN
  Administered 2018-05-13: 30 mL via ORAL

## 2018-05-13 MED ORDER — FENTANYL CITRATE (PF) 100 MCG/2ML IJ SOLN
50.0000 ug | Freq: Once | INTRAMUSCULAR | Status: AC
  Administered 2018-05-13: 50 ug via INTRAVENOUS
  Filled 2018-05-13: qty 2

## 2018-05-13 MED ORDER — IOPAMIDOL (ISOVUE-300) INJECTION 61%
INTRAVENOUS | Status: AC
  Filled 2018-05-13: qty 100

## 2018-05-13 NOTE — ED Provider Notes (Signed)
[facility] DEPT Provider Note   CSN: [id number] Arrival date & time: 05/13/18  1211     History   Chief Complaint Chief Complaint  Patient presents with  . Emesis    HPI Meghan Charles is a 47 y.o. female.  The history is provided by the patient and medical records.  Abdominal Pain   This is a new problem. The current episode started yesterday. The problem occurs constantly. The problem has not changed since onset.The pain is associated with eating. The pain is located in the generalized abdominal region. The quality of the pain is aching, burning and cramping. The pain is at a severity of 9/10. The pain is severe. Associated symptoms include diarrhea (absent today), nausea, vomiting and constipation. Pertinent negatives include fever, dysuria, frequency and headaches. The symptoms are aggravated by palpation and eating. Nothing relieves the symptoms. Past workup includes surgery. Her past medical history is significant for Crohn's disease.    Past Medical History:  Diagnosis Date  . Anxiety   . Colon obstruction ([place])   . Colon polyps 05/2014   TUBULAR ADENOMA AND HYPERPLASTIC POLYP.  . Crohn's disease of small intestine with complication ([place])   . Gallbladder polyp   . Tubular adenoma of colon     Patient Active Problem List   Diagnosis Date Noted  . Encounter for therapeutic drug monitoring 08/10/2017  . Adalimumab (Humira) long-term use 08/10/2017  . Hypokalemia   . 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 RLQ abdominal pain   . Pollen allergies 03/26/2015  . Immunosuppressed status ([place]) 03/26/2015  . Exacerbation of Crohn's disease ([place])   . High risk medication use   . Diarrhea 01/18/2015  . Crohn's colitis ([place]) 12/26/2014  . Essential hypertension   . CAP (community acquired pneumonia) 10/02/2014  . Tachycardia 10/02/2014  . Physical deconditioning 10/02/2014  . PNA (pneumonia) 09/24/2014  . Pleural effusion 09/24/2014  . Nausea with  vomiting 09/24/2014  . Leukocytosis 09/24/2014  . Sinus tachycardia 09/24/2014  . Dehydration 09/24/2014  . Gastroesophageal reflux disease without esophagitis 09/01/2014  . Hx of adenomatous colonic polyps 09/01/2014  . Abdominal pain 11/05/2012  . 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 Nausea and vomiting in adult 11/05/2012  . Blood loss anemia 11/05/2012    Past Surgical History:  Procedure Laterality Date  . COLON RESECTION N/A 05/11/2015   Procedure: Laparotomy with resection of Chron's Disease small bowel resection biopsy mesenteric nodule;  Surgeon: Fanny Skates, MD;  Location: [facility];  Service: General;  Laterality: N/A;  . COLONOSCOPY W/ BIOPSIES  2015  . LAPAROSCOPIC LYSIS OF ADHESIONS  02/2017  . 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 Right Heel surgery     with plates and screws  . wisdom teeth extracted       OB History   None      Home Medications    Prior to Admission medications   Medication Sig Start Date End Date Taking? Authorizing Provider  aspirin 325 MG EC tablet Take 325 mg by mouth every 6 (six) hours as needed for pain.    [provider]  medroxyPROGESTERone (DEPO-PROVERA) 150 MG/ML injection Inject 150 mg into the muscle every 3 (three) months.    [provider]  metoCLOPramide (REGLAN) 10 MG tablet Take 1 tablet (10 mg total) by mouth every 6 (six) hours as needed for nausea (nausea/headache). 03/17/18   Molpus, John, MD  promethazine (PHENERGAN) 25 MG tablet Take 1 tablet (25 mg total) by mouth  every 6 (six) hours as needed for nausea or vomiting. 03/08/18   McDonald, Mia A, PA-C  temazepam (RESTORIL) 15 MG capsule Take 15 mg by mouth at bedtime as needed for sleep.  03/01/18   [provider]    Family History Family History  Problem Relation Age of Onset  . Colon cancer Maternal Grandmother 73  . Breast cancer Mother 74  . Esophageal cancer Neg Hx   . Stomach cancer Neg Hx   . Rectal cancer Neg Hx   . Pancreatic  cancer Neg Hx     Social History Social History   Tobacco Use  . Smoking status: Never Smoker  . Smokeless tobacco: Never Used  Substance Use Topics  . Alcohol use: Yes    Alcohol/week: 0.0 oz    Comment: 3 times per week  . Drug use: No     Allergies   Morphine and related   Review of Systems Review of Systems  Constitutional: Negative for chills, diaphoresis, fatigue and fever.  HENT: Negative for congestion.   Respiratory: Negative for cough, chest tightness, shortness of breath and wheezing.   Cardiovascular: Negative for chest pain and palpitations.  Gastrointestinal: Positive for abdominal pain, constipation, diarrhea (absent today), nausea and vomiting. Negative for abdominal distention.  Genitourinary: Negative for dysuria, flank pain and frequency.  Musculoskeletal: Negative for back pain, neck pain and neck stiffness.  Skin: Negative for rash and wound.  Neurological: Negative for light-headedness and headaches.  All other systems reviewed and are negative.    Physical Exam Updated Vital Signs BP (!) 141/105 (BP Location: Left Arm)   Pulse 94   Temp 98.2 F (36.8 C) (Oral)   Resp 18   Ht 5' 8"  (1.727 m)   Wt 67.1 kg (148 lb)   SpO2 100%   BMI 22.50 kg/m   Physical Exam  Constitutional: She is oriented to person, place, and time. She appears well-developed and well-nourished. No distress.  HENT:  Head: Normocephalic and atraumatic.  Mouth/Throat: Oropharynx is clear and moist. No oropharyngeal exudate.  Eyes: Pupils are equal, round, and reactive to light. Conjunctivae and EOM are normal.  Neck: Normal range of motion. Neck supple.  Cardiovascular: Normal rate and regular rhythm.  No murmur heard. Pulmonary/Chest: Effort normal and breath sounds normal. No respiratory distress. She has no wheezes. She exhibits no tenderness.  Abdominal: Soft. Normal appearance. There is generalized tenderness. There is no rigidity, no rebound and no CVA tenderness.    Musculoskeletal: She exhibits no edema.  Neurological: She is alert and oriented to person, place, and time.  Skin: Skin is warm and dry. Capillary refill takes less than 2 seconds. No rash noted. She is not diaphoretic. No erythema.  Psychiatric: She has a normal mood and affect.  Nursing note and vitals reviewed.    ED Treatments / Results  Labs (all labs ordered are listed, but only abnormal results are displayed) Labs Reviewed  COMPREHENSIVE METABOLIC PANEL - Abnormal; Notable for the following components:      Result Value   Glucose, Bld 112 (*)    Total Protein 8.7 (*)    All other components within normal limits  CBC - Abnormal; Notable for the following components:   WBC 11.4 (*)    All other components within normal limits  URINALYSIS, ROUTINE W REFLEX MICROSCOPIC - Abnormal; Notable for the following components:   Hgb urine dipstick MODERATE (*)    Ketones, ur 5 (*)    All other components within  normal limits  LIPASE, BLOOD  I-STAT BETA HCG BLOOD, ED ([facility], [facility], [facility] ONLY)    EKG None  Radiology Ct Abdomen Pelvis W Contrast  Result Date: 05/13/2018 CLINICAL DATA:  Acute onset of vomiting. Current history of Crohn's disease. EXAM: CT ABDOMEN AND PELVIS WITH CONTRAST TECHNIQUE: Multidetector CT imaging of the abdomen and pelvis was performed using the standard protocol following bolus administration of intravenous contrast. CONTRAST:  123m ISOVUE-300 IOPAMIDOL (ISOVUE-300) INJECTION 61% COMPARISON:  CT of the abdomen and pelvis from 12/13/2017 FINDINGS: Lower chest: The visualized lung bases are grossly clear. The visualized portions of the mediastinum are unremarkable. The visualized portions of the breast implants are grossly unremarkable. Hepatobiliary: The liver is unremarkable in appearance. The gallbladder is unremarkable in appearance. The common bile duct remains normal in caliber. Pancreas: The pancreas is within normal limits. Spleen: The spleen is unremarkable in  appearance. Adrenals/Urinary Tract: A 2.8 cm right adrenal adenoma is noted. The left adrenal gland is unremarkable in appearance. The kidneys are grossly unremarkable. There is no evidence of hydronephrosis. No renal or ureteral stones are identified. No perinephric stranding is seen. Stomach/Bowel: The stomach is unremarkable in appearance. The small bowel is within normal limits. The patient is status post reversal of prior loop ileostomy. The appendix is normal in caliber, without evidence of appendicitis. Minimal diverticulosis is noted along the sigmoid colon, without evidence of diverticulitis. Vascular/Lymphatic: The abdominal aorta is unremarkable in appearance. The inferior vena cava is grossly unremarkable. No retroperitoneal lymphadenopathy is seen. No pelvic sidewall lymphadenopathy is identified. Reproductive: The bladder is mildly distended and grossly unremarkable. A tampon is noted at the vagina. The uterus is grossly unremarkable in appearance. The ovaries are relatively symmetric. No suspicious adnexal masses are seen. Other: No additional soft tissue abnormalities are seen. Musculoskeletal: No acute osseous abnormalities are identified. The visualized musculature is unremarkable in appearance. IMPRESSION: 1. No acute abnormality seen to explain the patient's symptoms. No evidence for exacerbation of the patient's Crohn's disease at this time. 2. 2.8 cm right adrenal adenoma incidentally noted. 3. Minimal diverticulosis along the sigmoid colon, without evidence of diverticulitis. Electronically Signed   By: JGarald BaldingM.D.   On: 05/13/2018 19:35    Procedures Procedures (including critical care time)  Medications Ordered in ED Medications  iopamidol (ISOVUE-300) 61 % injection (has no administration in time range)  fentaNYL (SUBLIMAZE) injection 50 mcg (50 mcg Intravenous Given 05/13/18 1713)  sodium chloride 0.9 % bolus 1,000 mL (0 mLs Intravenous Stopped 05/13/18 1755)  promethazine  (PHENERGAN) injection 25 mg (25 mg Intravenous Given 05/13/18 1751)  iohexol (OMNIPAQUE) 300 MG/ML solution 30 mL (30 mLs Oral Contrast Given 05/13/18 1724)  iopamidol (ISOVUE-300) 61 % injection 100 mL (100 mLs Intravenous Contrast Given 05/13/18 1925)     Initial Impression / Assessment and Plan / ED Course  I have reviewed the triage vital signs and the nursing notes.  Pertinent labs & imaging results that were available during my care of the patient were reviewed by me and considered in my medical decision making (see chart for details).     HKemiah Boozis a 47y.o. female with a past medical history significant for Crohn's disease, prior colon surgery and subsequent bowel obstruction and lysis surgery who presents with abdominal pain, nausea, vomiting, decreased bowel movement.  Patient reports that starting last night around 10 PM she started having nausea and vomiting.  She does report she has had several days of loose stools but has not  had a bowel movement today.  She denies fevers, chills, chest pain or shortness of breath.  She denies any trauma or sick contacts.  She reports that she has had no p.o. intake since yesterday and has had nearly continuous nausea and vomiting.  She says that she has not had a bowel movement today which is atypical especially since she was having diarrhea leading up to this today.  She denies any urinary symptoms or pelvic symptoms.  Patient describes her pain as 9 out of 10 in severity and is a cramping and burning pain across her abdomen.  On exam, abdomen had faint tenderness diffusely.  CVA areas and back nontender.  Lungs clear and chest nontender.  Patient has very laboratory testing which is overall reassuring.  Urinalysis does not show infection, lipase not elevated.  Pregnancy is negative.  Metabolic panel showed reassuring liver and kidney function and electrolytes.  CBC appeared improved from prior with improving leukocytosis.  No  anemia.  Given the patient's report of abdominal pain, nausea vomiting, and absent bowel movement today and her history of obstruction and Crohn's, patient will have a CT to further evaluate.  Given pain and nausea medications as well as fluids for symptomatic management.  Anticipate reassessment after work-up.  10:20 PM Diagnostic work-up was overall reassuring.  CT scan showed adenoma but otherwise no significant abnormal eyes physically no Crohn's flare or diverticulitis.  No stones seen.  Patient will much better and was able to tolerate eating and drinking after medicines and fluids.  Next  Patient given prescription for Phenergan and will follow-up with PCP in several days.  Patient had no other questions or concerns and was discharged in good condition.    Final Clinical Impressions(s) / ED Diagnoses   Final diagnoses:  Generalized abdominal pain    ED Discharge Orders        Ordered    promethazine (PHENERGAN) 25 MG tablet  Every 6 hours PRN     05/13/18 2220      Clinical Impression: 1. Generalized abdominal pain     Disposition: Discharge  Condition: Good  I have discussed the results, Dx and Tx plan with the pt(& family if present). He/she/they expressed understanding and agree(s) with the plan. Discharge instructions discussed at great length. Strict return precautions discussed and pt &/or family have verbalized understanding of the instructions. No further questions at time of discharge.    New Prescriptions   PROMETHAZINE (PHENERGAN) 25 MG TABLET    Take 1 tablet (25 mg total) by mouth every 6 (six) hours as needed for nausea or vomiting.    Follow Up: [place] [id number] [phone] Schedule an appointment as soon as possible for a visit    [facility] DEPT [place] [id number] mc [place]  [place]       Jaydon Avina, Gwenyth Allegra, MD 05/14/18 [phone]

## 2018-05-13 NOTE — ED Notes (Signed)
Patient transported to CT 

## 2018-05-13 NOTE — ED Notes (Signed)
Patient tolerating PO intake

## 2018-05-13 NOTE — ED Notes (Signed)
MD made aware of patient's trending BP.

## 2018-05-13 NOTE — ED Notes (Signed)
Per MD request, patient given crackers and water.

## 2018-05-13 NOTE — Discharge Instructions (Signed)
Your work-up today was overall reassuring and your imaging did not show evidence of recurrent Crohn's obstruction or  diverticulitis.  Since you have improved, we feel you are safe for discharge home.  Please maintain hydration and use the nausea medicine to help with your symptoms.  Please follow-up with a primary care physician in the next several days.  If any symptoms change or worsen, please return to the nearest emergency department.

## 2018-05-13 NOTE — ED Triage Notes (Signed)
Pt states she started to throw up last night a little and worse this morning, feels like it may be related to her Chron's.

## 2018-05-13 NOTE — ED Notes (Signed)
ED Provider at bedside. 

## 2018-06-11 ENCOUNTER — Ambulatory Visit: Payer: 59 | Admitting: Internal Medicine

## 2018-06-11 ENCOUNTER — Encounter: Payer: Self-pay | Admitting: Internal Medicine

## 2018-06-11 VITALS — BP 132/84 | HR 80 | Ht 68.0 in | Wt 158.0 lb

## 2018-06-11 DIAGNOSIS — Z86010 Personal history of colonic polyps: Secondary | ICD-10-CM

## 2018-06-11 DIAGNOSIS — R112 Nausea with vomiting, unspecified: Secondary | ICD-10-CM

## 2018-06-11 DIAGNOSIS — K50019 Crohn's disease of small intestine with unspecified complications: Secondary | ICD-10-CM | POA: Diagnosis not present

## 2018-06-11 MED ORDER — PROMETHAZINE HCL 25 MG PO TABS: 25.0000 mg | ORAL_TABLET | Freq: Three times a day (TID) | ORAL | 1 refills | Status: DC | PRN

## 2018-06-11 MED ORDER — SUPREP BOWEL PREP KIT 17.5-3.13-1.6 GM/177ML PO SOLN: 1.0000 | ORAL | 0 refills | Status: DC

## 2018-06-11 NOTE — Progress Notes (Signed)
Subjective:    Patient ID: Meghan Charles, female    DOB: 31-Jan-1971, 47 y.o.   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  She is here alone today and was last seen in June 2018.  At the time of her last visit she had just had her ileostomy reversed after her second segmental small bowel resection.  She had been off steroids but I recommended anti-TNF therapy given her lack of response to [facility].  We cleared this with Dr. Linus Salmons of ID given that she was treated with INH therapy for latent TB and also Dr. Chase Caller with pulmonary given her history of parapneumonic effusions after single dose Remicade.  Humira was prescribed but she never started this medication.  She actually has the starter kit at home.  She states she did not start it because she was waiting to complete the shingles vaccine.  She also admits to being somewhat nervous given her illness after single dose Remicade.  On the whole she states it has been a good year.  She is always nervous about a Crohn's flare but has not had significant Crohn's flare this year.  She has not required steroids.  She has had several visits to the ER for right-sided abdominal pain with nausea and vomiting.  This was most recently the case in May when she was seen and had a CT scan of the abdomen pelvis.  This did not show any evidence of active IBD.  At that time she was diagnosed with a viral enteritis.  She eats a varied diet but does avoid fruits and some vegetables.  She does occasionally have right sided abdominal pain but it is typically short-lived.  She estimates  that she has an episode of nausea and vomiting every couple of months.  Bowel movements occur daily usually 2 or 3 times each morning but stools are not diarrheal and did not contain blood or melena.  Energy levels are good.  She is working 2 jobs.  She is not taking any medicine other than aspirin daily and Depo-Provera.  She is off of MiraLAX and Benefiber.  Review of Systems As per HPI, otherwise negative  Current Medications, Allergies, Past Medical History, Past Surgical History, Family History and Social History were reviewed in [employer] record.     Objective:   Physical Exam BP 132/84   Pulse 80   Ht '5\' 8"'  (1.727 m)   Wt 158 lb (71.7 kg)   BMI 24.02 kg/m  Constitutional: Well-developed and well-nourished. No distress. HEENT: Normocephalic and atraumatic.  Conjunctivae are normal.  No scleral icterus. Neck: Neck supple. Trachea midline. Cardiovascular: Normal rate, regular rhythm and intact distal pulses. No M/R/G Pulmonary/chest: Effort normal and breath sounds normal. No wheezing, rales or rhonchi. Abdominal: Soft, right middle and lower quadrant tenderness which is moderate without rebound or guarding, nondistended. Bowel sounds active throughout. There are no masses palpable. No hepatosplenomegaly. Extremities: no clubbing, cyanosis, or edema Neurological: Alert and oriented to person place and time. Skin: Skin is warm and dry. Psychiatric: Normal mood and affect. Behavior is normal.  CT ABDOMEN AND PELVIS WITH CONTRAST  TECHNIQUE: Multidetector CT imaging of the abdomen and pelvis was performed using the standard protocol following bolus administration of intravenous contrast.   CONTRAST:  165m ISOVUE-300 IOPAMIDOL (ISOVUE-300) INJECTION 61%   COMPARISON:  CT of the abdomen and pelvis from 12/13/2017   FINDINGS: Lower chest: The visualized lung bases are grossly clear. The visualized portions of the mediastinum are unremarkable.  The visualized portions of the breast implants are grossly unremarkable.   Hepatobiliary: The liver is unremarkable in appearance. The gallbladder is unremarkable in appearance. The common bile duct remains normal in caliber.   Pancreas: The pancreas is within normal limits.   Spleen: The spleen is unremarkable in appearance.   Adrenals/Urinary Tract: A 2.8 cm right adrenal adenoma is noted. The left adrenal gland is unremarkable in appearance.   The kidneys are grossly unremarkable. There is no evidence of hydronephrosis. No renal or ureteral stones are identified. No perinephric stranding is seen.   Stomach/Bowel: The stomach is unremarkable in appearance. The small bowel is within normal limits. The patient is status post reversal of prior loop ileostomy. The appendix is normal in caliber, without evidence of appendicitis.   Minimal diverticulosis is noted along the sigmoid colon, without evidence of diverticulitis.   Vascular/Lymphatic: The abdominal aorta is unremarkable in appearance. The inferior vena cava is grossly unremarkable. No retroperitoneal lymphadenopathy is seen. No pelvic sidewall lymphadenopathy is identified.   Reproductive: The bladder is mildly distended and grossly unremarkable. A tampon is noted at the vagina. The uterus is grossly unremarkable in appearance. The ovaries are relatively symmetric. No suspicious adnexal masses are seen.   Other: No additional soft tissue abnormalities are seen.   Musculoskeletal: No acute osseous abnormalities are identified. The visualized musculature is unremarkable in appearance.   IMPRESSION: 1. No acute abnormality seen to explain the patient's symptoms. No evidence for exacerbation of the patient's Crohn's disease at this time. 2. 2.8 cm right adrenal adenoma incidentally noted. 3. Minimal diverticulosis along the sigmoid colon, without evidence of diverticulitis.     Electronically Signed   By: JGarald BaldingM.D.   On: 05/13/2018 19:35   CBC    Component Value Date/Time   WBC 11.4 (H) 05/13/2018 1231   RBC 4.70 05/13/2018 1231   HGB 14.7 05/13/2018 1231   HCT 44.1 05/13/2018 1231   PLT 298 05/13/2018 1231   MCV 93.8 05/13/2018 1231   MCH 31.3 05/13/2018 1231   MCHC 33.3 05/13/2018 1231   RDW 13.2 05/13/2018 1231   LYMPHSABS 1.8 08/22/2017 1112   MONOABS 0.3 08/22/2017 1112   EOSABS 0.0 08/22/2017 1112   BASOSABS 0.0 08/22/2017 1112   CMP     Component Value Date/Time   NA 142 05/13/2018 1231   K 4.0 05/13/2018 1231   CL 107 05/13/2018 1231   CO2 23 05/13/2018 1231   GLUCOSE 112 (H) 05/13/2018 1231   BUN 16 05/13/2018 1231   CREATININE 0.67 05/13/2018 1231   CALCIUM 9.7 05/13/2018 1231   PROT 8.7 (H) 05/13/2018 1231   ALBUMIN 4.8 05/13/2018 1231   AST 18 05/13/2018 1231   ALT 23 05/13/2018 1231   ALKPHOS 58 05/13/2018 1231   BILITOT 0.5 05/13/2018 1231   GFRNONAA >60 05/13/2018 1231   GFRAA >60 05/13/2018 1231      Assessment & Plan:  47year old female with a history of stricturing Crohn's ileitis diagnosed in 2015 status post 2 surgical resections (brief period with ileostomy subsequently reversed), history of bilateral pneumonia with parapneumonic effusion in 2015 (  occurred after 1 dose of Remicade), 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  1.  Ileal Crohn's disease --she has previously had an aggressive Crohn's disease causing strictures and obstructions.  She has had 2 small bowel resections and temporarily had ileostomy.  After last visit in June 2018 I recommended Humira but this was never started.  Interestingly she has not had a flare of Crohn's disease in the past year and recent CT scan, which I have reviewed, did not reveal activity of her Crohn's disease.  She has mixed feelings regarding therapy, particularly in light of her illness after single dose Remicade 4 years ago.  She  also does not want to put herself at risk of further surgery.  We discussed this at length today.  My recommendation is that we repeat colonoscopy with attempt at ileal intubation to see if we see any evidence of active Crohn's disease.  Overall I think she would likely do better on Humira than not but we will proceed with colonoscopy first.  She is due for colonoscopy for polyp surveillance also.  See #2.  Avoid NSAIDs.  2.  History of adenomatous colon polyp --10 mm adenoma of the cecum in 2015.  Due for surveillance colonoscopy.  We discussed the risk, benefits and alternatives to colonoscopy and she is agreeable and wishes to proceed  3.  Intermittent nausea and vomiting --certainly could relate to #1.  Phenergan 25 mg every 6-8 hours as needed nausea

## 2018-06-11 NOTE — Patient Instructions (Signed)
You have been scheduled for a colonoscopy. Please follow written instructions given to you at your visit today.  Please pick up your prep supplies at the pharmacy within the next 1-3 days. If you use inhalers (even only as needed), please bring them with you on the day of your procedure. Your physician has requested that you go to www.startemmi.com and enter the access code given to you at your visit today. This web site gives a general overview about your procedure. However, you should still follow specific instructions given to you by our office regarding your preparation for the procedure.  We have sent the following medications to your pharmacy for you to pick up at your convenience: Phenergan 25 mg every 8 hours as needed for nausea  If you are age 3 or older, your body mass index should be between 23-30. Your Body mass index is 24.02 kg/m. If this is out of the aforementioned range listed, please consider follow up with your Primary Care Provider.  If you are age 61 or younger, your body mass index should be between 19-25. Your Body mass index is 24.02 kg/m. If this is out of the aformentioned range listed, please consider follow up with your Primary Care Provider.

## 2018-06-12 ENCOUNTER — Telehealth: Payer: Self-pay | Admitting: Internal Medicine

## 2018-06-12 MED ORDER — TEMAZEPAM 30 MG PO CAPS: 30.0000 mg | ORAL_CAPSULE | Freq: Every evening | ORAL | 1 refills | Status: DC | PRN

## 2018-06-12 NOTE — Telephone Encounter (Signed)
Dr Hilarie Fredrickson, okay to send temazepam for patient?

## 2018-06-12 NOTE — Telephone Encounter (Signed)
Yes, qHS PRN only. Would try not to use nightly

## 2018-06-12 NOTE — Telephone Encounter (Signed)
Rx sent 

## 2018-07-11 ENCOUNTER — Telehealth: Payer: Self-pay | Admitting: Internal Medicine

## 2018-07-11 NOTE — Telephone Encounter (Signed)
We sent an rx for temazepam #15 tablets on 06/12/18 with 1 additional refills. Dr Hilarie Fredrickson was very specific on 06/12/18 when he gave me orders for this prescription that he wanted patient to be taking this sparingly and only as needed, not every single night. I had left a message for her on 06/12/18 asking her to call back so I could advise her of this at the same time as I sent the script, however she never called back. In addition, I added a line on her script indicating that the #15 should last 1 full month. I have left another message for her to call back so I can actually speak with her about this as she is typically pretty compliant in this sense and I want to make sure she understands.

## 2018-07-15 MED ORDER — TEMAZEPAM 30 MG PO CAPS: 30.0000 mg | ORAL_CAPSULE | Freq: Every evening | ORAL | 0 refills | Status: DC | PRN

## 2018-07-15 NOTE — Telephone Encounter (Signed)
I have spoken to patient and have relayed information that Dr Hilarie Fredrickson wants her to take temazepam only AS NEEDED and she should be tapering down on the amount she is taking. She verbalizes understanding. I will go ahead and fill early this time since she did not understand these instructions previously but advised next time, these should last one month. She verbalizes understanding.

## 2018-08-01 ENCOUNTER — Encounter: Payer: Self-pay | Admitting: Internal Medicine

## 2018-08-12 ENCOUNTER — Encounter: Payer: 59 | Admitting: Internal Medicine

## 2018-08-20 ENCOUNTER — Telehealth: Payer: Self-pay | Admitting: Internal Medicine

## 2018-08-21 MED ORDER — PROMETHAZINE HCL 25 MG PO TABS: 25.0000 mg | ORAL_TABLET | Freq: Three times a day (TID) | ORAL | 1 refills | Status: DC | PRN

## 2018-08-21 MED ORDER — TEMAZEPAM 30 MG PO CAPS: 30.0000 mg | ORAL_CAPSULE | Freq: Every evening | ORAL | 0 refills | Status: DC | PRN

## 2018-08-21 NOTE — Telephone Encounter (Signed)
Rx sent 

## 2018-09-10 ENCOUNTER — Telehealth: Payer: Self-pay | Admitting: Internal Medicine

## 2018-09-10 NOTE — Telephone Encounter (Signed)
No, but we need to get her rescheduled JMP

## 2018-09-10 NOTE — Telephone Encounter (Signed)
Patient has been rescheduled to 10/21/18 at 3 pm. She is also scheduled for previsit on 09/30/18 at 8 am since her last set of instructions were given 5 months ago. Patient verbalizes understanding.

## 2018-09-11 ENCOUNTER — Encounter: Payer: 59 | Admitting: Internal Medicine

## 2018-09-23 ENCOUNTER — Telehealth: Payer: Self-pay | Admitting: Internal Medicine

## 2018-09-23 MED ORDER — TEMAZEPAM 30 MG PO CAPS: 30.0000 mg | ORAL_CAPSULE | Freq: Every evening | ORAL | 0 refills | Status: DC | PRN

## 2018-09-23 NOTE — Telephone Encounter (Signed)
Pt needs refill for sleep prescription sent to [facility] in [place].

## 2018-09-23 NOTE — Telephone Encounter (Signed)
Rx sent 

## 2018-09-25 ENCOUNTER — Emergency Department (HOSPITAL_COMMUNITY): Payer: 59

## 2018-09-25 ENCOUNTER — Encounter (HOSPITAL_COMMUNITY): Payer: Self-pay | Admitting: Emergency Medicine

## 2018-09-25 ENCOUNTER — Emergency Department (HOSPITAL_COMMUNITY)
Admission: EM | Admit: 2018-09-25 | Discharge: 2018-09-25 | Disposition: A | Discharge status: Home / Self Care | Payer: 59 | Attending: Emergency Medicine | Admitting: Emergency Medicine

## 2018-09-25 DIAGNOSIS — I10 Essential (primary) hypertension: Secondary | ICD-10-CM | POA: Insufficient documentation

## 2018-09-25 DIAGNOSIS — R112 Nausea with vomiting, unspecified: Secondary | ICD-10-CM

## 2018-09-25 DIAGNOSIS — G8929 Other chronic pain: Secondary | ICD-10-CM

## 2018-09-25 DIAGNOSIS — Z79899 Other long term (current) drug therapy: Secondary | ICD-10-CM | POA: Diagnosis not present

## 2018-09-25 DIAGNOSIS — R197 Diarrhea, unspecified: Secondary | ICD-10-CM

## 2018-09-25 DIAGNOSIS — R1011 Right upper quadrant pain: Secondary | ICD-10-CM | POA: Diagnosis present

## 2018-09-25 DIAGNOSIS — R109 Unspecified abdominal pain: Secondary | ICD-10-CM

## 2018-09-25 LAB — LIPASE, BLOOD: LIPASE: 43 U/L (ref 11–51)

## 2018-09-25 LAB — CBC
HCT: 40.4 % (ref 36.0–46.0)
Hemoglobin: 12.9 g/dL (ref 12.0–15.0)
MCH: 30.1 pg (ref 26.0–34.0)
MCHC: 31.9 g/dL (ref 30.0–36.0)
MCV: 94.4 fL (ref 80.0–100.0)
PLATELETS: 303 10*3/uL (ref 150–400)
RBC: 4.28 MIL/uL (ref 3.87–5.11)
RDW: 11.9 % (ref 11.5–15.5)
WBC: 6.6 10*3/uL (ref 4.0–10.5)
nRBC: 0 % (ref 0.0–0.2)

## 2018-09-25 LAB — COMPREHENSIVE METABOLIC PANEL
ALK PHOS: 58 U/L (ref 38–126)
ALT: 19 U/L (ref 0–44)
ANION GAP: 10 (ref 5–15)
AST: 17 U/L (ref 15–41)
Albumin: 4.1 g/dL (ref 3.5–5.0)
BUN: 18 mg/dL (ref 6–20)
CALCIUM: 9.5 mg/dL (ref 8.9–10.3)
CHLORIDE: 109 mmol/L (ref 98–111)
CO2: 25 mmol/L (ref 22–32)
CREATININE: 0.75 mg/dL (ref 0.44–1.00)
Glucose, Bld: 90 mg/dL (ref 70–99)
Potassium: 4.1 mmol/L (ref 3.5–5.1)
SODIUM: 144 mmol/L (ref 135–145)
TOTAL PROTEIN: 7.4 g/dL (ref 6.5–8.1)
Total Bilirubin: 0.6 mg/dL (ref 0.3–1.2)

## 2018-09-25 LAB — URINALYSIS, ROUTINE W REFLEX MICROSCOPIC
BILIRUBIN URINE: NEGATIVE
Glucose, UA: NEGATIVE mg/dL
Hgb urine dipstick: NEGATIVE
KETONES UR: NEGATIVE mg/dL
LEUKOCYTES UA: NEGATIVE
NITRITE: NEGATIVE
PH: 6 (ref 5.0–8.0)
PROTEIN: NEGATIVE mg/dL
Specific Gravity, Urine: 1.023 (ref 1.005–1.030)

## 2018-09-25 LAB — I-STAT BETA HCG BLOOD, ED (MC, WL, AP ONLY): I-stat hCG, quantitative: <5 m[IU]/mL (ref <5)

## 2018-09-25 MED ORDER — IOPAMIDOL (ISOVUE-300) INJECTION 61%
INTRAVENOUS | Status: AC
  Filled 2018-09-25: qty 100

## 2018-09-25 MED ORDER — SODIUM CHLORIDE 0.9 % IV BOLUS
1000.0000 mL | Freq: Once | INTRAVENOUS | Status: AC
  Administered 2018-09-25: 1000 mL via INTRAVENOUS

## 2018-09-25 MED ORDER — MORPHINE SULFATE (PF) 4 MG/ML IV SOLN
4.0000 mg | Freq: Once | INTRAVENOUS | Status: AC
  Administered 2018-09-25: 4 mg via INTRAVENOUS
  Filled 2018-09-25: qty 1

## 2018-09-25 MED ORDER — IOPAMIDOL (ISOVUE-300) INJECTION 61%
100.0000 mL | Freq: Once | INTRAVENOUS | Status: AC | PRN
  Administered 2018-09-25: 100 mL via INTRAVENOUS

## 2018-09-25 MED ORDER — SODIUM CHLORIDE 0.9 % IJ SOLN
INTRAMUSCULAR | Status: AC
  Filled 2018-09-25: qty 50

## 2018-09-25 NOTE — ED Provider Notes (Signed)
[facility] DEPT Provider Note   CSN: [id number] Arrival date & time: 09/25/18  1250     History   Chief Complaint Chief Complaint  Patient presents with  . Abdominal Pain  . Nausea  . Emesis  . Diarrhea    HPI Meghan Charles is a 47 y.o. female with history of aggressive Crohn's disease, status post 2 small bowel resections, ileostomy reversal, colonic polyps, chronic abdominal pain is here for evaluation of abdominal pain.  Onset last Wednesday.  Pain is located to the right mid abdomen, radiates to the right upper and right lower quadrants.  Described as a dull pain and a pulling, not sensation.  The pain is constant, 8/10.  Associated with diarrhea x8 and emesis x4 in the last 24 hours.  Has used Phenergan with mild relief of nausea.  Abdominal pain, nausea and diarrhea did not change after meals.  Symptoms are worsening.  No alleviating factors.  Aggravated by palpation.  Last surgery was in March 2018.  She is followed by Dr. Hilarie Fredrickson with GI.   Denies associated fevers, chills, hematuria, urinary frequency or urgency.  History of kidney stones several years ago.    HPI  Past Medical History:  Diagnosis Date  . Anxiety   . Colon obstruction ([place])   . Colon polyps 05/2014   TUBULAR ADENOMA AND HYPERPLASTIC POLYP.  . Crohn's disease of small intestine with complication ([place])   . Gallbladder polyp   . Tubular adenoma of colon     Patient Active Problem List   Diagnosis Date Noted  . Encounter for therapeutic drug monitoring 08/10/2017  . Adalimumab (Humira) long-term use 08/10/2017  . Hypokalemia   . 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 RLQ abdominal pain   . Pollen allergies 03/26/2015  . Immunosuppressed status ([place]) 03/26/2015  . Exacerbation of Crohn's disease ([place])   . High risk medication use   . Diarrhea 01/18/2015  . Crohn's colitis ([place]) 12/26/2014  . Essential hypertension   . CAP (community acquired pneumonia) 10/02/2014  .  Tachycardia 10/02/2014  . Physical deconditioning 10/02/2014  . PNA (pneumonia) 09/24/2014  . Pleural effusion 09/24/2014  . Nausea with vomiting 09/24/2014  . Leukocytosis 09/24/2014  . Sinus tachycardia 09/24/2014  . Dehydration 09/24/2014  . Gastroesophageal reflux disease without esophagitis 09/01/2014  . Hx of adenomatous colonic polyps 09/01/2014  . Abdominal pain 11/05/2012  . 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 Nausea and vomiting in adult 11/05/2012  . Blood loss anemia 11/05/2012    Past Surgical History:  Procedure Laterality Date  . COLON RESECTION N/A 05/11/2015   Procedure: Laparotomy with resection of Chron's Disease small bowel resection biopsy mesenteric nodule;  Surgeon: Fanny Skates, MD;  Location: [facility]S;  Service: General;  Laterality: N/A;  . COLONOSCOPY W/ BIOPSIES  2015  . LAPAROSCOPIC LYSIS OF ADHESIONS  02/2017  . 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 Right Heel surgery     with plates and screws  . wisdom teeth extracted       OB History   None      Home Medications    Prior to Admission medications   Medication Sig Start Date End Date Taking? Authorizing Provider  aspirin 325 MG EC tablet Take 325 mg by mouth every 6 (six) hours as needed for pain.   Yes [provider]  medroxyPROGESTERone (DEPO-PROVERA) 150 MG/ML injection Inject 150 mg into the muscle every 3 (three) months.   Yes [provider]  promethazine (  PHENERGAN) 25 MG tablet Take 1 tablet (25 mg total) by mouth every 8 (eight) hours as needed for nausea or vomiting. 08/21/18  Yes Pyrtle, Lajuan Lines, MD  temazepam (RESTORIL) 30 MG capsule Take 1 capsule (30 mg total) by mouth at bedtime as needed for sleep. 09/23/18  Yes Pyrtle, Lajuan Lines, MD  SUPREP BOWEL PREP KIT 17.5-3.13-1.6 GM/177ML SOLN Take 1 kit by mouth as directed. For colonoscopy prep 06/11/18   Pyrtle, Lajuan Lines, MD    Family History Family History  Problem Relation Age of Onset  . Colon cancer Maternal  Grandmother 58  . Breast cancer Mother 21  . Esophageal cancer Neg Hx   . Stomach cancer Neg Hx   . Rectal cancer Neg Hx   . Pancreatic cancer Neg Hx     Social History Social History   Tobacco Use  . Smoking status: Never Smoker  . Smokeless tobacco: Never Used  Substance Use Topics  . Alcohol use: Yes    Alcohol/week: 0.0 standard drinks    Comment: 3 times per week  . Drug use: No     Allergies   Morphine and related   Review of Systems Review of Systems  Gastrointestinal: Positive for abdominal pain, diarrhea, nausea and vomiting.  All other systems reviewed and are negative.    Physical Exam Updated Vital Signs BP (!) 137/98 (BP Location: Left Arm)   Pulse 78   Temp 98.3 F (36.8 C) (Oral)   Resp 16   Ht '5\' 8"'  (1.727 m)   Wt 69.9 kg   SpO2 99%   BMI 23.42 kg/m   Physical Exam  Constitutional: She is oriented to person, place, and time. She appears well-developed and well-nourished.  Non toxic  HENT:  Head: Normocephalic and atraumatic.  Nose: Nose normal.  Eyes: Pupils are equal, round, and reactive to light. Conjunctivae and EOM are normal.  Neck: Normal range of motion.  Cardiovascular: Normal rate and regular rhythm.  Pulmonary/Chest: Effort normal and breath sounds normal.  Abdominal: Soft. Bowel sounds are normal. There is tenderness.  TTP to RUQ, right mid abdomen, RLQ. Positive Murphy's and McBurney's. No G/R/R. No suprapubic or CVA tenderness. Active BS to lower quadrants.   Musculoskeletal: Normal range of motion.  Neurological: She is alert and oriented to person, place, and time.  Skin: Skin is warm and dry. Capillary refill takes less than 2 seconds.  Psychiatric: She has a normal mood and affect. Her behavior is normal.  Nursing note and vitals reviewed.    ED Treatments / Results  Labs (all labs ordered are listed, but only abnormal results are displayed) Labs Reviewed  LIPASE, BLOOD  COMPREHENSIVE METABOLIC PANEL  CBC    URINALYSIS, ROUTINE W REFLEX MICROSCOPIC  I-STAT BETA HCG BLOOD, ED ([facility], [facility], [facility] ONLY)    EKG None  Radiology Ct Abdomen Pelvis W Contrast  Result Date: 09/25/2018 CLINICAL DATA:  Right-sided abdominal pain with nausea and vomiting EXAM: CT ABDOMEN AND PELVIS WITH CONTRAST TECHNIQUE: Multidetector CT imaging of the abdomen and pelvis was performed using the standard protocol following bolus administration of intravenous contrast. CONTRAST:  16m ISOVUE-300 IOPAMIDOL (ISOVUE-300) INJECTION 61% COMPARISON:  05/13/2018 FINDINGS: Lower chest: No acute abnormality. Bilateral breast implants are noted. Hepatobiliary: No focal liver abnormality is seen. No gallstones, gallbladder wall thickening, or biliary dilatation. Pancreas: Unremarkable. No pancreatic ductal dilatation or surrounding inflammatory changes. Spleen: Normal in size without focal abnormality. Adrenals/Urinary Tract: Left adrenal gland is within normal limits. Right adrenal gland  demonstrates a 2.8 cm hypodense mass lesion which is roughly stable when compared with multiple previous exams dating back to 2017 consistent with an adenoma. The kidneys demonstrate a normal enhancement pattern bilaterally. Normal excretion of contrast material is seen. The bladder is partially decompressed. Stomach/Bowel: Scattered diverticular change of the colon is noted without evidence of diverticulitis. The appendix is within normal limits. No inflammatory changes are seen. No small bowel or stomach abnormality is noted. Vascular/Lymphatic: No significant vascular findings are present. No enlarged abdominal or pelvic lymph nodes. Reproductive: Uterus and bilateral adnexa are unremarkable. Other: No abdominal wall hernia or abnormality. No abdominopelvic ascites. Musculoskeletal: No acute or significant osseous findings. IMPRESSION: No acute abnormality noted. Stable right adrenal lesion consistent with adenoma. Diverticulosis without diverticulitis.  Electronically Signed   By: Inez Catalina M.D.   On: 09/25/2018 18:41    Procedures Procedures (including critical care time)  Medications Ordered in ED Medications  iopamidol (ISOVUE-300) 61 % injection (has no administration in time range)  sodium chloride 0.9 % injection (has no administration in time range)  sodium chloride 0.9 % bolus 1,000 mL (0 mLs Intravenous Stopped 09/25/18 1900)  morphine 4 MG/ML injection 4 mg (4 mg Intravenous Given 09/25/18 1718)  iopamidol (ISOVUE-300) 61 % injection 100 mL (100 mLs Intravenous Contrast Given 09/25/18 1800)     Initial Impression / Assessment and Plan / ED Course  I have reviewed the triage vital signs and the nursing notes.  Pertinent labs & imaging results that were available during my care of the patient were reviewed by me and considered in my medical decision making (see chart for details).  Clinical Course as of Sep 25 1904  Wed Sep 25, 2018  1844 IMPRESSION:IMPRESSION: No acute abnormality noted.  Stable right adrenal lesion consistent with adenoma.  Diverticulosis without diverticulitis.  CT ABDOMEN PELVIS W CONTRAST [CG]    Clinical Course User Index [CG] Kinnie Feil, PA-C    Concern for viral enteritis vs IBD complication.  On exam she has diffuse right sided abd tenderness including +Murphy's and McBurney's raising suspicion for cholecystitis or appendicitis, although this would be odd given symptom onset 1 week ago, no fever.  She has aggressive Chron's and new stricture or obstruction is possible.  Additionally, she has chronic right sided abdominal pain but states this is different and worse.  Last CTAP in May unremarkable.  Labs today unremarkable. Will opt for CT AP as I think it will give [place] a better comprehensive look. I discussed risk of frequent CTAP and pt verbalized understanding, she agrees to move forward with CTAP.   1900: CTAP with uncomplicated diverticulosis, stable right adrenal adenoma which  patient is aware of.  Symptoms improved with morphine. No emesis or diarrhea in ER. VSS. Repeat abd exam improved. Discussed results with patient.  Recommend bland diet, phenergan, tylenol and f/u with GI in 1 week if symptoms do not improve. Return instructions given. Pt is in agreement and comfortable with this plan.   Final Clinical Impressions(s) / ED Diagnoses   Final diagnoses:  Chronic abdominal pain  Nausea vomiting and diarrhea    ED Discharge Orders    None       Arlean Hopping 09/25/18 1905    Valarie Merino, MD 09/26/18 [phone]

## 2018-09-25 NOTE — ED Notes (Signed)
Patient transported to CT 

## 2018-09-25 NOTE — ED Triage Notes (Signed)
Patient here from home with complaints of right side abd pain, nausea, vomiting, and diarrhea that started 3 days ago. "I think this is a Chron's flare up".

## 2018-09-25 NOTE — Discharge Instructions (Signed)
Work up in the emergency department was reassuring. CT abdomen/pelvis shower diverticula but not signs of infection or diverticulitis.    I suspect your symptoms are due to a viral infection.   Treatment includes symptoms control, oral hydration, prevention of spread of illness.   Nausea medication for the next 24 hours to avoid vomiting and tolerate fluids.  Acetaminophen (tylenol) for body aches, abdominal pain. Ibuprofen (advil, aleve) can be irritating to stomach, avoid these until you feel better.  Stay well hydrated with 1-2 L of water daily.  Start with bland, mild diet avoiding hot, spicy, greasy foods until you start to feel better and then can transition to normal diet. Avoid fruit or fruit juices as these can worsen diarrhea.  Norovirus and other stomach bugs are killed by alcohol and standard cleaning agents. Wash hands often and do not share utensils or drinks to avoid spread to family members.  Return for worsening abdominal pain, inability to tolerating fluids by mouth despite nausea medication, localized abdominal pain, blood in vomit or stool, fever

## 2018-09-26 ENCOUNTER — Telehealth: Payer: Self-pay | Admitting: *Deleted

## 2018-09-26 NOTE — Telephone Encounter (Signed)
Patient called back and spoke to [place], [facility] who advised her of appointment tomorrow with Anderson Malta. Patient states that she will come for appointment.

## 2018-09-26 NOTE — Telephone Encounter (Signed)
Patient has been scheduled to see Ellouise Newer, PA-C on 09/27/18 at 315 pm. I have left voicemail for patient to call back.   ===View-only below this line===  ----- Message ----- From: Jerene Bears, MD Sent: 09/26/2018   8:13 AM EDT To: Larina Bras, CMA  Back in ER Needs APP visit with last seen APP Apparently there are many APP spots available currently JMP

## 2018-09-27 ENCOUNTER — Ambulatory Visit: Payer: 59 | Admitting: Physician Assistant

## 2018-09-27 ENCOUNTER — Encounter: Payer: Self-pay | Admitting: Physician Assistant

## 2018-09-27 VITALS — BP 140/100 | HR 104 | Ht 66.0 in | Wt 160.4 lb

## 2018-09-27 DIAGNOSIS — Z86010 Personal history of colonic polyps: Secondary | ICD-10-CM | POA: Diagnosis not present

## 2018-09-27 DIAGNOSIS — K50919 Crohn's disease, unspecified, with unspecified complications: Secondary | ICD-10-CM

## 2018-09-27 DIAGNOSIS — R112 Nausea with vomiting, unspecified: Secondary | ICD-10-CM

## 2018-09-27 MED ORDER — DICYCLOMINE HCL 20 MG PO TABS: 20.0000 mg | ORAL_TABLET | Freq: Four times a day (QID) | ORAL | 2 refills | Status: DC

## 2018-09-27 MED ORDER — PREDNISONE 10 MG PO TABS: ORAL_TABLET | ORAL | 0 refills | Status: AC

## 2018-09-27 NOTE — Patient Instructions (Addendum)
Start a Prednisone taper at 40 mg for 2 weeks and continue to decrease by 5 mg every week.   We have sent the following medications to your pharmacy for you to pick up at your convenience: Dicyclomine 20 mg everuy 6 hours as needed for pain  You have been scheduled for a colonoscopy. Please follow written instructions given to you at your visit today.  Please pick up your prep supplies at the pharmacy within the next 1-3 days. If you use inhalers (even only as needed), please bring them with you on the day of your procedure. Your physician has requested that you go to www.startemmi.com and enter the access code given to you at your visit today. This web site gives a general overview about your procedure. However, you should still follow specific instructions given to you by our office regarding your preparation for the procedure.

## 2018-09-27 NOTE — Progress Notes (Addendum)
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 Hilarie Fredrickson, who presents to clinic today after being seen in the ER for Crohn's ileitis with complication.    06/11/2018 last office visit.  Please see that very detailed note.  At that time it was recommended the patient start Humira.  Patient agreed to consider this after a colonoscopy was completed for further evaluation of her disease state.  It was noted that she is due for a surveillance colonoscopy due to history of adenomatous polyps as well.  This was scheduled.    Patient tells me today that she had an appointment for colonoscopy which she had to cancel as 1 of her friends got sick.    Today, explains that she has been having constant nausea and abdominal pain mostly in the right upper quadrant.  She describes this as a "numbing not like you are ovulating x100 worse".  Describes loose stools typically 4-5 in the morning and no further.  She has had a couple episodes of vomiting.  Denies seeing any hematochezia.  Tells me right now she is not on any therapy for her Crohn's and would like to wait to start anything until after time of colonoscopy.    Denies fever, chills, weight loss, anorexia or symptoms that awaken her from sleep.  Past Medical History:  Diagnosis Date  . Anxiety   . Colon obstruction ([place])   . Colon polyps 05/2014   TUBULAR ADENOMA AND HYPERPLASTIC POLYP.  . Crohn's disease of small intestine with complication ([place])   . Gallbladder polyp   . Tubular adenoma of colon     Past Surgical  History:  Procedure Laterality Date  . COLON RESECTION N/A 05/11/2015   Procedure: Laparotomy with resection of Chron's Disease small bowel resection biopsy mesenteric nodule;  Surgeon: Fanny Skates, MD;  Location: [facility];  Service: General;  Laterality: N/A;  . COLONOSCOPY W/ BIOPSIES  2015  . LAPAROSCOPIC LYSIS OF ADHESIONS  02/2017  . 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 Right Heel surgery     with plates and screws  . wisdom teeth extracted      Current Outpatient Medications  Medication Sig Dispense Refill  . aspirin 325 MG EC tablet Take 325 mg by mouth every 6 (six) hours as needed for pain.    . medroxyPROGESTERone (DEPO-PROVERA) 150 MG/ML injection Inject 150 mg into the muscle every 3 (three) months.    . promethazine (PHENERGAN) 25 MG tablet Take 1 tablet (25 mg total) by mouth every 8 (eight) hours as needed for nausea or vomiting. 40 tablet 1  . temazepam (RESTORIL) 30 MG capsule Take 1 capsule (30 mg total) by mouth at bedtime as needed for sleep. 15 capsule 0  . SUPREP BOWEL PREP KIT 17.5-3.13-1.6 GM/177ML SOLN Take 1 kit by mouth as directed. For colonoscopy prep (Patient not taking: Reported on 09/27/2018) 2 Bottle 0   No current facility-administered medications for this visit.     Allergies as of 09/27/2018 - Review Complete 09/27/2018  Allergen Reaction Noted  . Morphine and related Other (See Comments) 05/11/2015  Family History  Problem Relation Age of Onset  . Colon cancer Maternal Grandmother 33  . Breast cancer Mother 79  . Esophageal cancer Neg Hx   . Stomach cancer Neg Hx   . Rectal cancer Neg Hx   . Pancreatic cancer Neg Hx     Social History   Socioeconomic History  . Marital status: Single    Spouse name: Not on file  . Number of children: Not on file  . Years of education: Not on file  . Highest education level: Not on file  Occupational History  . Occupation: Contractor: [employer]  Social Needs  . Financial resource  strain: Not on file  . Food insecurity:    Worry: Not on file    Inability: Not on file  . Transportation needs:    Medical: Not on file    Non-medical: Not on file  Tobacco Use  . Smoking status: Never Smoker  . Smokeless tobacco: Never Used  Substance and Sexual Activity  . Alcohol use: Yes    Alcohol/week: 0.0 standard drinks    Comment: 3 times per week  . Drug use: No  . Sexual activity: Never    Birth control/protection: Injection  Lifestyle  . Physical activity:    Days per week: Not on file    Minutes per session: Not on file  . Stress: Not on file  Relationships  . Social connections:    Talks on phone: Not on file    Gets together: Not on file    Attends religious service: Not on file    Active member of club or organization: Not on file    Attends meetings of clubs or organizations: Not on file    Relationship status: Not on file  . Intimate partner violence:    Fear of current or ex partner: Not on file    Emotionally abused: Not on file    Physically abused: Not on file    Forced sexual activity: Not on file  Other Topics Concern  . Not on file  Social History Narrative  . Not on file    Review of Systems:    Constitutional: No weight loss, fever or chills Cardiovascular: No chest pain  Respiratory: No SOB Gastrointestinal: See HPI and otherwise negative   Physical Exam:  Vital signs: BP (!) 140/100 (BP Location: Left Arm, Patient Position: Sitting, Cuff Size: Normal)   Pulse (!) 104   Ht '5\' 6"'  (1.676 m)   Wt 160 lb 6 oz (72.7 kg)   BMI 25.89 kg/m   Constitutional:  Caucasian female appears to be in NAD, Well developed, Well nourished, alert and cooperative Respiratory: Respirations even and unlabored. Lungs clear to auscultation bilaterally.   No wheezes, crackles, or rhonchi.  Cardiovascular: Normal S1, S2. No MRG. Regular rate and rhythm. No peripheral edema, cyanosis or pallor.  Gastrointestinal:  Soft, nondistended, moderate right sided ttp  with involuntary guarding, Normal bowel sounds. No appreciable masses or hepatomegaly. Rectal:  Not performed.  Psychiatric: Demonstrates good judgement and reason without abnormal affect or behaviors.  RELEVANT LABS AND IMAGING: CBC    Component Value Date/Time   WBC 6.6 09/25/2018 1321   RBC 4.28 09/25/2018 1321   HGB 12.9 09/25/2018 1321   HCT 40.4 09/25/2018 1321   PLT 303 09/25/2018 1321   MCV 94.4 09/25/2018 1321   MCH 30.1 09/25/2018 1321   MCHC 31.9 09/25/2018 1321   RDW 11.9 09/25/2018 1321  LYMPHSABS 1.8 08/22/2017 1112   MONOABS 0.3 08/22/2017 1112   EOSABS 0.0 08/22/2017 1112   BASOSABS 0.0 08/22/2017 1112    CMP     Component Value Date/Time   NA 144 09/25/2018 1321   K 4.1 09/25/2018 1321   CL 109 09/25/2018 1321   CO2 25 09/25/2018 1321   GLUCOSE 90 09/25/2018 1321   BUN 18 09/25/2018 1321   CREATININE 0.75 09/25/2018 1321   CALCIUM 9.5 09/25/2018 1321   PROT 7.4 09/25/2018 1321   ALBUMIN 4.1 09/25/2018 1321   AST 17 09/25/2018 1321   ALT 19 09/25/2018 1321   ALKPHOS 58 09/25/2018 1321   BILITOT 0.6 09/25/2018 1321   GFRNONAA >60 09/25/2018 1321   GFRAA >60 09/25/2018 1321    Assessment: 1.  Crohn's ileocolitis with complication: Complicated history with aggressive Crohn's disease causing strictures and obstructions, 2 small bowel resections and temporarily had ileostomy in the past, previous reaction to Remicade, currently not on therapy, now with abdominal pain and increased nausea and vomiting 2.  History of adenomatous colon polyps: 10 mm adenoma of the cecum in 2015, patient is due for surveillance colonoscopy 3.  Intermittent nausea and vomiting: Thought possibly related to ileal stricturing versus pain from Crohn's  Plan: 1.  Patient is scheduled for colonoscopy with Dr. Hilarie Fredrickson on November 4.  Reviewed risks, benefits, limitations and alternatives and the patient agrees to proceed.  We did go ahead and go over instructions with the patient  today and canceled her pre-visit. 2.  Prescribed Dicyclomine 20 mg every 6 hours as needed for abdominal cramping #30 with 1 refill 3.  Start patient on Prednisone 40 mg daily x2 weeks, tapering down by 5 mg/week after that 4.  Patient to follow in clinic with Dr. Hilarie Fredrickson after time of colonoscopy, she will call before that if she has any problems.  Ellouise Newer, PA-C [facility] 09/27/2018, 3:39 PM  Cc: No ref. provider found   Addendum: Reviewed and agree with assessment and management plan. Pyrtle, Lajuan Lines, MD

## 2018-10-15 ENCOUNTER — Other Ambulatory Visit: Payer: Self-pay

## 2018-10-15 ENCOUNTER — Emergency Department (HOSPITAL_COMMUNITY)
Admission: EM | Admit: 2018-10-15 | Discharge: 2018-10-15 | Disposition: A | Discharge status: Home / Self Care | Payer: 59 | Attending: Emergency Medicine | Admitting: Emergency Medicine

## 2018-10-15 DIAGNOSIS — R112 Nausea with vomiting, unspecified: Secondary | ICD-10-CM | POA: Diagnosis present

## 2018-10-15 DIAGNOSIS — R109 Unspecified abdominal pain: Secondary | ICD-10-CM | POA: Diagnosis not present

## 2018-10-15 DIAGNOSIS — R319 Hematuria, unspecified: Secondary | ICD-10-CM | POA: Insufficient documentation

## 2018-10-15 DIAGNOSIS — K50919 Crohn's disease, unspecified, with unspecified complications: Secondary | ICD-10-CM

## 2018-10-15 DIAGNOSIS — Z79899 Other long term (current) drug therapy: Secondary | ICD-10-CM | POA: Insufficient documentation

## 2018-10-15 LAB — URINALYSIS, ROUTINE W REFLEX MICROSCOPIC
Bilirubin Urine: NEGATIVE
GLUCOSE, UA: NEGATIVE mg/dL
Ketones, ur: NEGATIVE mg/dL
Leukocytes, UA: NEGATIVE
Nitrite: NEGATIVE
PROTEIN: NEGATIVE mg/dL
RBC / HPF: >50 RBC/hpf — ABNORMAL HIGH (ref 0–5)
SPECIFIC GRAVITY, URINE: 1.023 (ref 1.005–1.030)
pH: 5 (ref 5.0–8.0)

## 2018-10-15 LAB — COMPREHENSIVE METABOLIC PANEL
ALT: 16 U/L (ref 0–44)
AST: 15 U/L (ref 15–41)
Albumin: 4.4 g/dL (ref 3.5–5.0)
Alkaline Phosphatase: 52 U/L (ref 38–126)
Anion gap: 9 (ref 5–15)
BUN: 16 mg/dL (ref 6–20)
CALCIUM: 9.9 mg/dL (ref 8.9–10.3)
CO2: 27 mmol/L (ref 22–32)
Chloride: 105 mmol/L (ref 98–111)
Creatinine, Ser: 0.75 mg/dL (ref 0.44–1.00)
GFR calc Af Amer: >60 mL/min (ref >60)
GLUCOSE: 110 mg/dL — AB (ref 70–99)
Potassium: 3.6 mmol/L (ref 3.5–5.1)
Sodium: 141 mmol/L (ref 135–145)
TOTAL PROTEIN: 8 g/dL (ref 6.5–8.1)
Total Bilirubin: 0.8 mg/dL (ref 0.3–1.2)

## 2018-10-15 LAB — LIPASE, BLOOD: LIPASE: 30 U/L (ref 11–51)

## 2018-10-15 LAB — CBC
HCT: 44.6 % (ref 36.0–46.0)
Hemoglobin: 14.5 g/dL (ref 12.0–15.0)
MCH: 29.9 pg (ref 26.0–34.0)
MCHC: 32.5 g/dL (ref 30.0–36.0)
MCV: 92 fL (ref 80.0–100.0)
Platelets: 312 10*3/uL (ref 150–400)
RBC: 4.85 MIL/uL (ref 3.87–5.11)
RDW: 12.6 % (ref 11.5–15.5)
WBC: 11.5 10*3/uL — AB (ref 4.0–10.5)
nRBC: 0 % (ref 0.0–0.2)

## 2018-10-15 MED ORDER — SODIUM CHLORIDE 0.9 % IV BOLUS
1000.0000 mL | Freq: Once | INTRAVENOUS | Status: AC
  Administered 2018-10-15: 1000 mL via INTRAVENOUS

## 2018-10-15 MED ORDER — ONDANSETRON HCL 4 MG/2ML IJ SOLN
4.0000 mg | Freq: Once | INTRAMUSCULAR | Status: AC
  Administered 2018-10-15: 4 mg via INTRAVENOUS
  Filled 2018-10-15: qty 2

## 2018-10-15 MED ORDER — SODIUM CHLORIDE 0.9 % IV BOLUS (SEPSIS)
500.0000 mL | Freq: Once | INTRAVENOUS | Status: AC
  Administered 2018-10-15: 500 mL via INTRAVENOUS

## 2018-10-15 MED ORDER — SODIUM CHLORIDE 0.9 % IV SOLN
1000.0000 mL | INTRAVENOUS | Status: DC
  Administered 2018-10-15: 1000 mL via INTRAVENOUS

## 2018-10-15 MED ORDER — HYDROMORPHONE HCL 1 MG/ML IJ SOLN
1.0000 mg | Freq: Once | INTRAMUSCULAR | Status: AC
  Administered 2018-10-15: 1 mg via INTRAVENOUS
  Filled 2018-10-15: qty 1

## 2018-10-15 MED ORDER — PROMETHAZINE HCL 25 MG PO TABS: 25.0000 mg | ORAL_TABLET | Freq: Three times a day (TID) | ORAL | 1 refills | Status: DC | PRN

## 2018-10-15 NOTE — ED Triage Notes (Signed)
Pt from home, reporting right sided flank pain that started 3 days ago, n/v/d x3 days. No blood in emesis or stool.   Pt reports having migraine yesterday, took medicine that just made her vomit.  Pt has hx of Crohns, colonoscopy scheduled for this coming Monday. Pt reports being prescribed prednisone 35 mg/ day, but doesn't think its working.

## 2018-10-15 NOTE — ED Provider Notes (Signed)
[facility] DEPT Provider Note   CSN: [id number] Arrival date & time: 10/15/18  1034     History   Chief Complaint Chief Complaint  Patient presents with  . Flank Pain    HPI Meghan Charles is a 47 y.o. female.  HPI Pt started having that she feels is like a crohns flare over the past two weeks.  Over the weekend it got worse.   Pt has been having nausea, vomiting and diarrhea.  She had several episodes of each over the weekend, whenever she tried to eat or drink.  No fevers.  No trouble urinating.  Pt has been on prednisone for the last two weeks but does not feel any better.  She is scheduled to have a colonoscopy this week.   She was seen in the ED on 10/9 and had a normal CT scan.   She was started on steroids with plans for close GI follow-up.   Dr Hilarie Fredrickson is GI MD Past Medical History:  Diagnosis Date  . Anxiety   . Colon obstruction ([place])   . Colon polyps 05/2014   TUBULAR ADENOMA AND HYPERPLASTIC POLYP.  . Crohn's disease of small intestine with complication ([place])   . Gallbladder polyp   . Tubular adenoma of colon     Patient Active Problem List   Diagnosis Date Noted  . Encounter for therapeutic drug monitoring 08/10/2017  . Adalimumab (Humira) long-term use 08/10/2017  . Hypokalemia   . 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 RLQ abdominal pain   . Pollen allergies 03/26/2015  . Immunosuppressed status ([place]) 03/26/2015  . Exacerbation of Crohn's disease ([place])   . High risk medication use   . Diarrhea 01/18/2015  . Crohn's colitis ([place]) 12/26/2014  . Essential hypertension   . CAP (community acquired pneumonia) 10/02/2014  . Tachycardia 10/02/2014  . Physical deconditioning 10/02/2014  . PNA (pneumonia) 09/24/2014  . Pleural effusion 09/24/2014  . Nausea with vomiting 09/24/2014  . Leukocytosis 09/24/2014  . Sinus tachycardia 09/24/2014  . Dehydration 09/24/2014  . Gastroesophageal reflux disease without esophagitis 09/01/2014  .  Hx of adenomatous colonic polyps 09/01/2014  . Abdominal pain 11/05/2012  . 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 Nausea and vomiting in adult 11/05/2012  . Blood loss anemia 11/05/2012    Past Surgical History:  Procedure Laterality Date  . COLON RESECTION N/A 05/11/2015   Procedure: Laparotomy with resection of Chron's Disease small bowel resection biopsy mesenteric nodule;  Surgeon: Fanny Skates, MD;  Location: [facility];  Service: General;  Laterality: N/A;  . COLONOSCOPY W/ BIOPSIES  2015  . LAPAROSCOPIC LYSIS OF ADHESIONS  02/2017  . 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 Right Heel surgery     with plates and screws  . wisdom teeth extracted       OB History   None      Home Medications    Prior to Admission medications   Medication Sig Start Date End Date Taking? Authorizing Provider  acetaminophen (TYLENOL) 500 MG tablet Take 1,500 mg by mouth every 6 (six) hours as needed for moderate pain or headache.   Yes [provider]  dicyclomine (BENTYL) 20 MG tablet Take 1 tablet (20 mg total) by mouth every 6 (six) hours. 09/27/18  Yes Levin Erp, PA  medroxyPROGESTERone (DEPO-PROVERA) 150 MG/ML injection Inject 150 mg into the muscle every 3 (three) months.   Yes [provider]  predniSONE (DELTASONE) 10 MG tablet Take 4 tablets (40 mg  total) by mouth daily for 14 days, THEN 3.5 tablets (35 mg total) daily for 7 days, THEN 3 tablets (30 mg total) daily for 7 days, THEN 2.5 tablets (25 mg total) daily for 7 days, THEN 2 tablets (20 mg total) daily with breakfast for 7 days, THEN 1.5 tablets (15 mg total) daily for 7 days, THEN 1 tablet (10 mg total) daily for 7 days, THEN 0.5 tablets (5 mg total) daily for 7 days. 09/27/18 11/29/18 Yes Lemmon, Lavone Nian, PA  SUPREP BOWEL PREP KIT 17.5-3.13-1.6 GM/177ML SOLN Take 1 kit by mouth as directed. For colonoscopy prep 06/11/18  Yes Pyrtle, Lajuan Lines, MD  temazepam (RESTORIL) 30 MG capsule Take 1 capsule (30  mg total) by mouth at bedtime as needed for sleep. 09/23/18  Yes Pyrtle, Lajuan Lines, MD  promethazine (PHENERGAN) 25 MG tablet Take 1 tablet (25 mg total) by mouth every 8 (eight) hours as needed for nausea or vomiting. 10/15/18   Dorie Rank, MD    Family History Family History  Problem Relation Age of Onset  . Colon cancer Maternal Grandmother 68  . Breast cancer Mother 5  . Esophageal cancer Neg Hx   . Stomach cancer Neg Hx   . Rectal cancer Neg Hx   . Pancreatic cancer Neg Hx     Social History Social History   Tobacco Use  . Smoking status: Never Smoker  . Smokeless tobacco: Never Used  Substance Use Topics  . Alcohol use: Yes    Alcohol/week: 0.0 standard drinks    Comment: 3 times per week  . Drug use: No     Allergies   Morphine and related   Review of Systems Review of Systems  All other systems reviewed and are negative.    Physical Exam Updated Vital Signs BP 122/88   Pulse 86   Temp 98.3 F (36.8 C) (Oral)   Resp 18   Ht 1.727 m (_0 )   Wt 70.3 kg   SpO2 96%   BMI 23.57 kg/m   Physical Exam  Constitutional: She appears well-developed and well-nourished. No distress.  HENT:  Head: Normocephalic and atraumatic.  Right Ear: External ear normal.  Left Ear: External ear normal.  Eyes: Conjunctivae are normal. Right eye exhibits no discharge. Left eye exhibits no discharge. No scleral icterus.  Neck: Neck supple. No tracheal deviation present.  Cardiovascular: Normal rate, regular rhythm and intact distal pulses.  Pulmonary/Chest: Effort normal and breath sounds normal. No stridor. No respiratory distress. She has no wheezes. She has no rales.  Abdominal: Soft. Bowel sounds are normal. She exhibits no distension. There is generalized tenderness. There is no rebound and no guarding.  Musculoskeletal: She exhibits no edema or tenderness.  Neurological: She is alert. She has normal strength. No cranial nerve deficit (no facial droop, extraocular  movements intact, no slurred speech) or sensory deficit. She exhibits normal muscle tone. She displays no seizure activity. Coordination normal.  Skin: Skin is warm and dry. No rash noted.  Psychiatric: She has a normal mood and affect.  Nursing note and vitals reviewed.    ED Treatments / Results  Labs (all labs ordered are listed, but only abnormal results are displayed) Labs Reviewed  COMPREHENSIVE METABOLIC PANEL - Abnormal; Notable for the following components:      Result Value   Glucose, Bld 110 (*)    All other components within normal limits  CBC - Abnormal; Notable for the following components:   WBC 11.5 (*)  All other components within normal limits  URINALYSIS, ROUTINE W REFLEX MICROSCOPIC - Abnormal; Notable for the following components:   Hgb urine dipstick LARGE (*)    RBC / HPF >50 (*)    Bacteria, UA FEW (*)    All other components within normal limits  LIPASE, BLOOD    EKG None  Radiology No results found.  Procedures Procedures (including critical care time)  Medications Ordered in ED Medications  sodium chloride 0.9 % bolus 500 mL (0 mLs Intravenous Stopped 10/15/18 1322)    Followed by  0.9 %  sodium chloride infusion (0 mLs Intravenous Stopped 10/15/18 1504)  ondansetron (ZOFRAN) injection 4 mg (4 mg Intravenous Given 10/15/18 1236)  HYDROmorphone (DILAUDID) injection 1 mg (1 mg Intravenous Given 10/15/18 1237)  sodium chloride 0.9 % bolus 1,000 mL (0 mLs Intravenous Stopped 10/15/18 1504)     Initial Impression / Assessment and Plan / ED Course  I have reviewed the triage vital signs and the nursing notes.  Pertinent labs & imaging results that were available during my care of the patient were reviewed by me and considered in my medical decision making (see chart for details).  Clinical Course as of Oct 16 1511  Tue Oct 15, 2018  1301 D/w Alonza Bogus.  Discussed treatment.   Repeat imaging not necessary at this time.   [JK]      Clinical Course User Index [JK] Dorie Rank, MD    Patient presented to the emergency room with complaints of persistent abdominal pain.  Patient has a history of Crohn's disease that had been in remission.  She started having abdominal pain a couple weeks ago.  Patient's recent evaluation in the ED included a CT scan that did not show any acute abnormalities.  Patient's laboratory tests are notable for hematuria today.  Etiology is somewhat uncertain.  Does not appear to look like a urinary tract infection and the CT scan she had previously did not show any evidence of ureteral stones.  Patient denies any current menses.  This will need to be followed up on but I doubt that this is the source of her pain.  Is point I think is reasonable for the patient to continue her steroids.  I will give her prescription for Phenergan for nausea.  She will follow-up with her GI doctor as planned  Final Clinical Impressions(s) / ED Diagnoses   Final diagnoses:  Abdominal pain, unspecified abdominal location  Crohn's disease with complication, unspecified gastrointestinal tract location [facility])  Hematuria, unspecified type    ED Discharge Orders         Ordered    promethazine (PHENERGAN) 25 MG tablet  Every 8 hours PRN     10/15/18 1505           Dorie Rank, MD 10/15/18 1512

## 2018-10-15 NOTE — Discharge Instructions (Addendum)
Continue current medications, follow-up with your GI doctor as planned, monitor for fever worsening symptoms.  Follow up with your primary care doctor to have your urine rechecked in the next week or so

## 2018-10-21 ENCOUNTER — Ambulatory Visit (AMBULATORY_SURGERY_CENTER): Payer: 59 | Admitting: Internal Medicine

## 2018-10-21 ENCOUNTER — Encounter: Payer: Self-pay | Admitting: Internal Medicine

## 2018-10-21 VITALS — BP 134/96 | HR 93 | Temp 97.8°F | Resp 15 | Ht 66.0 in | Wt 160.0 lb

## 2018-10-21 DIAGNOSIS — K50019 Crohn's disease of small intestine with unspecified complications: Secondary | ICD-10-CM

## 2018-10-21 DIAGNOSIS — Z86010 Personal history of colonic polyps: Secondary | ICD-10-CM

## 2018-10-21 DIAGNOSIS — D122 Benign neoplasm of ascending colon: Secondary | ICD-10-CM | POA: Diagnosis not present

## 2018-10-21 DIAGNOSIS — K5730 Diverticulosis of large intestine without perforation or abscess without bleeding: Secondary | ICD-10-CM

## 2018-10-21 MED ORDER — SODIUM CHLORIDE 0.9 % IV SOLN: 500.0000 mL | Freq: Once | INTRAVENOUS | Status: DC

## 2018-10-21 NOTE — Progress Notes (Signed)
Called to room to assist during endoscopic procedure.  Patient ID and intended procedure confirmed with present staff. Received instructions for my participation in the procedure from the performing physician.  

## 2018-10-21 NOTE — Progress Notes (Signed)
Alert and oriented x3, pleased with MAC, report to RN  

## 2018-10-21 NOTE — Op Note (Signed)
[facility] Patient Name: Meghan Charles Procedure Date: 10/21/2018 3:54 PM MRN: [id number] Endoscopist: Jerene Bears , MD Age: 47 Referring MD:  Date of Birth: 30-May-1971 Gender: Female Account #: [id number] Procedure:                Colonoscopy Indications:              Abdominal pain, Crohn's disease of the small bowel,                            personal history of adenomatous colon polyps, last                            colonoscopy 4 years ago Medicines:                Monitored Anesthesia Care Procedure:                Pre-Anesthesia Assessment:                           - Prior to the procedure, a History and Physical                            was performed, and patient medications and                            allergies were reviewed. The patient's tolerance of                            previous anesthesia was also reviewed. The risks                            and benefits of the procedure and the sedation                            options and risks were discussed with the patient.                            All questions were answered, and informed consent                            was obtained. Prior Anticoagulants: The patient has                            taken no previous anticoagulant or antiplatelet                            agents. ASA Grade Assessment: III - A patient with                            severe systemic disease. After reviewing the risks                            and benefits, the patient was deemed in  satisfactory condition to undergo the procedure.                           After obtaining informed consent, the colonoscope                            was passed under direct vision. Throughout the                            procedure, the patient's blood pressure, pulse, and                            oxygen saturations were monitored continuously. The                            Model [id number]-[id number]  scope was introduced                            through the anus and advanced to the 10 cm into the                            ileum. The colonoscopy was performed without                            difficulty. The patient tolerated the procedure                            well. The quality of the bowel preparation was                            excellent. The terminal ileum, ileocecal valve,                            appendiceal orifice, and rectum were photographed. Scope In: 4:15:21 PM Scope Out: 4:27:29 PM Scope Withdrawal Time: 0 hours 9 minutes 54 seconds  Total Procedure Duration: 0 hours 12 minutes 8 seconds  Findings:                 The digital rectal exam was normal.                           The terminal ileum appeared normal. I did not see                            objective evidence of active Crohn's disease in the                            examined terminal ileum.                           Two sessile polyps were found in the ascending                            colon. The polyps were 3 to 6 mm in size.  These                            polyps were removed with a cold snare. Resection                            and retrieval were complete.                           Multiple small-mouthed diverticula were found in                            the sigmoid colon.                           The exam was otherwise without abnormality on                            direct and retroflexion views. Complications:            No immediate complications. Estimated Blood Loss:     Estimated blood loss was minimal. Impression:               - The examined portion of the ileum was normal.                           - Two 3 to 6 mm polyps in the ascending colon,                            removed with a cold snare. Resected and retrieved.                           - Diverticulosis in the sigmoid colon.                           - The examination was otherwise normal on direct                             and retroflexion views. Recommendation:           - Patient has a contact number available for                            emergencies. The signs and symptoms of potential                            delayed complications were discussed with the                            patient. Return to normal activities tomorrow.                            Written discharge instructions were provided to the                            patient.                           -  Resume previous diet.                           - Continue present medications.                           - Await pathology results.                           - Consider video capsule endoscopy to evaluate for                            active small bowel Crohn's disease. This will help                            guide management decisions particularly in regards                            to anti-TNF therapy.                           - Repeat colonoscopy is recommended for                            surveillance. The colonoscopy date will be                            determined after pathology results from today's                            exam become available for review. Jerene Bears, MD 10/21/2018 4:38:08 PM This report has been signed electronically.

## 2018-10-21 NOTE — Patient Instructions (Signed)
HANDOUTS GIVEN FOR POLYPS AND DIVERTICULOSIS  YOU HAD AN ENDOSCOPIC PROCEDURE TODAY AT [facility]:   Refer to the procedure report that was given to you for any specific questions about what was found during the examination.  If the procedure report does not answer your questions, please call your gastroenterologist to clarify.  If you requested that your care partner not be given the details of your procedure findings, then the procedure report has been included in a sealed envelope for you to review at your convenience later.  YOU SHOULD EXPECT: Some feelings of bloating in the abdomen. Passage of more gas than usual.  Walking can help get rid of the air that was put into your GI tract during the procedure and reduce the bloating. If you had a lower endoscopy (such as a colonoscopy or flexible sigmoidoscopy) you may notice spotting of blood in your stool or on the toilet paper. If you underwent a bowel prep for your procedure, you may not have a normal bowel movement for a few days.  Please Note:  You might notice some irritation and congestion in your nose or some drainage.  This is from the oxygen used during your procedure.  There is no need for concern and it should clear up in a day or so.  SYMPTOMS TO REPORT IMMEDIATELY:   Following lower endoscopy (colonoscopy or flexible sigmoidoscopy):  Excessive amounts of blood in the stool  Significant tenderness or worsening of abdominal pains  Swelling of the abdomen that is new, acute  Fever of 100F or higher   For urgent or emergent issues, a gastroenterologist can be reached at any hour by calling [phone].   DIET:  We do recommend a small meal at first, but then you may proceed to your regular diet.  Drink plenty of fluids but you should avoid alcoholic beverages for 24 hours.  ACTIVITY:  You should plan to take it easy for the rest of today and you should NOT DRIVE or use heavy machinery until tomorrow (because of  the sedation medicines used during the test).    FOLLOW UP: Our staff will call the number listed on your records the next business day following your procedure to check on you and address any questions or concerns that you may have regarding the information given to you following your procedure. If we do not reach you, we will leave a message.  However, if you are feeling well and you are not experiencing any problems, there is no need to return our call.  We will assume that you have returned to your regular daily activities without incident.  If any biopsies were taken you will be contacted by phone or by letter within the next 1-3 weeks.  Please call us at [phone] if you have not heard about the biopsies in 3 weeks.    SIGNATURES/CONFIDENTIALITY: You and/or your care partner have signed paperwork which will be entered into your electronic medical record.  These signatures attest to the fact that that the information above on your After Visit Summary has been reviewed and is understood.  Full responsibility of the confidentiality of this discharge information lies with you and/or your care-partner.

## 2018-10-22 ENCOUNTER — Telehealth: Payer: Self-pay | Admitting: *Deleted

## 2018-10-22 ENCOUNTER — Telehealth: Payer: Self-pay

## 2018-10-22 NOTE — Telephone Encounter (Signed)
  Follow up Call-  Call back number 10/21/2018  Post procedure Call Back phone  # [phone]  Permission to leave phone message Yes  Some recent data might be hidden     Patient questions:  Do you have a fever, pain , or abdominal swelling? No. Pain Score  0 *  Have you tolerated food without any problems? Yes.    Have you been able to return to your normal activities? Yes.    Do you have any questions about your discharge instructions: Diet   No. Medications  No. Follow up visit  No.  Do you have questions or concerns about your Care? No.  Actions: * If pain score is 4 or above: No action needed, pain <4.

## 2018-10-22 NOTE — Telephone Encounter (Signed)
Left message on answering machine. 

## 2018-10-24 ENCOUNTER — Telehealth: Payer: Self-pay | Admitting: Internal Medicine

## 2018-10-24 MED ORDER — TEMAZEPAM 30 MG PO CAPS: 30.0000 mg | ORAL_CAPSULE | Freq: Every evening | ORAL | 1 refills | Status: DC | PRN

## 2018-10-24 NOTE — Telephone Encounter (Signed)
Patient continues with need for temazepam for sleep. Has been using #15 tablets monthly. Continue refilling?

## 2018-10-24 NOTE — Telephone Encounter (Signed)
Meghan Charles, see Dr Vena Rua note regarding patency capsule... He would like to know if this is scheduled.

## 2018-10-24 NOTE — Telephone Encounter (Signed)
Yes okay for refill  Meghan Charles was also working on a patency capsule before video capsule endoscopy Please check to see if this is planned

## 2018-10-24 NOTE — Telephone Encounter (Signed)
Pt needs rf for tenazepam sent to [facility] on [address].

## 2018-10-24 NOTE — Telephone Encounter (Signed)
The ones we had were expired. They are on order.

## 2018-10-30 ENCOUNTER — Encounter: Payer: Self-pay | Admitting: Internal Medicine

## 2018-11-06 ENCOUNTER — Telehealth: Payer: Self-pay | Admitting: Internal Medicine

## 2018-11-06 MED ORDER — ONDANSETRON HCL 4 MG PO TABS: 4.0000 mg | ORAL_TABLET | Freq: Four times a day (QID) | ORAL | 1 refills | Status: DC | PRN

## 2018-11-06 NOTE — Telephone Encounter (Signed)
Zofran prescription refilled for pt and sent to pharmacy.

## 2018-11-06 NOTE — Telephone Encounter (Signed)
Patient need nausea meds called in to her pharmacy ([facility] in [place]) having severe nausea.

## 2018-11-07 ENCOUNTER — Emergency Department (HOSPITAL_COMMUNITY): Payer: 59

## 2018-11-07 ENCOUNTER — Emergency Department (HOSPITAL_COMMUNITY)
Admission: EM | Admit: 2018-11-07 | Discharge: 2018-11-07 | Disposition: A | Discharge status: Home / Self Care | Payer: 59 | Attending: Emergency Medicine | Admitting: Emergency Medicine

## 2018-11-07 ENCOUNTER — Encounter (HOSPITAL_COMMUNITY): Payer: Self-pay | Admitting: Emergency Medicine

## 2018-11-07 DIAGNOSIS — Z79899 Other long term (current) drug therapy: Secondary | ICD-10-CM | POA: Insufficient documentation

## 2018-11-07 DIAGNOSIS — I10 Essential (primary) hypertension: Secondary | ICD-10-CM | POA: Insufficient documentation

## 2018-11-07 DIAGNOSIS — R1111 Vomiting without nausea: Secondary | ICD-10-CM

## 2018-11-07 DIAGNOSIS — R1110 Vomiting, unspecified: Secondary | ICD-10-CM | POA: Insufficient documentation

## 2018-11-07 LAB — URINALYSIS, ROUTINE W REFLEX MICROSCOPIC
BILIRUBIN URINE: NEGATIVE
Glucose, UA: NEGATIVE mg/dL
Ketones, ur: NEGATIVE mg/dL
LEUKOCYTES UA: NEGATIVE
Nitrite: NEGATIVE
PH: 5 (ref 5.0–8.0)
Protein, ur: 30 mg/dL — AB
SPECIFIC GRAVITY, URINE: 1.036 — AB (ref 1.005–1.030)

## 2018-11-07 LAB — COMPREHENSIVE METABOLIC PANEL
ALBUMIN: 4.7 g/dL (ref 3.5–5.0)
ALT: 20 U/L (ref 0–44)
AST: 20 U/L (ref 15–41)
Alkaline Phosphatase: 58 U/L (ref 38–126)
Anion gap: 10 (ref 5–15)
BILIRUBIN TOTAL: 0.8 mg/dL (ref 0.3–1.2)
BUN: 16 mg/dL (ref 6–20)
CALCIUM: 9.4 mg/dL (ref 8.9–10.3)
CO2: 27 mmol/L (ref 22–32)
CREATININE: 0.76 mg/dL (ref 0.44–1.00)
Chloride: 102 mmol/L (ref 98–111)
Glucose, Bld: 109 mg/dL — ABNORMAL HIGH (ref 70–99)
Potassium: 4 mmol/L (ref 3.5–5.1)
Sodium: 139 mmol/L (ref 135–145)
Total Protein: 8.4 g/dL — ABNORMAL HIGH (ref 6.5–8.1)

## 2018-11-07 LAB — I-STAT BETA HCG BLOOD, ED (MC, WL, AP ONLY): I-stat hCG, quantitative: <5 m[IU]/mL (ref <5)

## 2018-11-07 LAB — CBC
HCT: 45 % (ref 36.0–46.0)
Hemoglobin: 14.6 g/dL (ref 12.0–15.0)
MCH: 29.9 pg (ref 26.0–34.0)
MCHC: 32.4 g/dL (ref 30.0–36.0)
MCV: 92 fL (ref 80.0–100.0)
NRBC: 0 % (ref 0.0–0.2)
Platelets: 312 10*3/uL (ref 150–400)
RBC: 4.89 MIL/uL (ref 3.87–5.11)
RDW: 13.1 % (ref 11.5–15.5)
WBC: 7.8 10*3/uL (ref 4.0–10.5)

## 2018-11-07 LAB — LIPASE, BLOOD: Lipase: 30 U/L (ref 11–51)

## 2018-11-07 MED ORDER — PROCHLORPERAZINE EDISYLATE 10 MG/2ML IJ SOLN
10.0000 mg | Freq: Once | INTRAMUSCULAR | Status: AC
  Administered 2018-11-07: 10 mg via INTRAVENOUS
  Filled 2018-11-07: qty 2

## 2018-11-07 MED ORDER — LACTATED RINGERS IV BOLUS
1000.0000 mL | Freq: Once | INTRAVENOUS | Status: AC
  Administered 2018-11-07: 1000 mL via INTRAVENOUS

## 2018-11-07 MED ORDER — DIPHENHYDRAMINE HCL 25 MG PO CAPS
50.0000 mg | ORAL_CAPSULE | Freq: Once | ORAL | Status: AC
  Administered 2018-11-07: 50 mg via ORAL
  Filled 2018-11-07: qty 2

## 2018-11-07 NOTE — ED Triage Notes (Signed)
Per pt, states she has been vomiting for 3 days-states her PCP gave her some Zofran but she said it isn't working-states no diarrhea, no dysuria-complaining of right lower abdominal pain

## 2018-11-07 NOTE — ED Provider Notes (Signed)
[facility] DEPT Provider Note   CSN: [id number] Arrival date & time: 11/07/18  0910     History   Chief Complaint Chief Complaint  Patient presents with  . Emesis    HPI Meghan Charles is a 47 y.o. female.  The history is provided by the spouse.  Emesis   This is a new problem. The current episode started more than 2 days ago. The problem occurs 2 to 4 times per day. The problem has been gradually improving. The emesis has an appearance of stomach contents. There has been no fever. The fever has been present for less than 1 day. Associated symptoms include abdominal pain (chronic). Pertinent negatives include no arthralgias, no chills, no cough, no diarrhea, no fever, no headaches, no myalgias, no sweats and no URI. Risk factors include ill contacts.    Past Medical History:  Diagnosis Date  . Allergy    seasonal  . Anxiety   . Colon obstruction ([place])   . Colon polyps 05/2014   TUBULAR ADENOMA AND HYPERPLASTIC POLYP.  . Crohn's disease of small intestine with complication ([place])   . Depression   . Gallbladder polyp   . Tubular adenoma of colon     Patient Active Problem List   Diagnosis Date Noted  . Encounter for therapeutic drug monitoring 08/10/2017  . Adalimumab (Humira) long-term use 08/10/2017  . Hypokalemia   . 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 RLQ abdominal pain   . Pollen allergies 03/26/2015  . Immunosuppressed status ([place]) 03/26/2015  . Exacerbation of Crohn's disease ([place])   . High risk medication use   . Diarrhea 01/18/2015  . Crohn's colitis ([place]) 12/26/2014  . Essential hypertension   . CAP (community acquired pneumonia) 10/02/2014  . Tachycardia 10/02/2014  . Physical deconditioning 10/02/2014  . PNA (pneumonia) 09/24/2014  . Pleural effusion 09/24/2014  . Nausea with vomiting 09/24/2014  . Leukocytosis 09/24/2014  . Sinus tachycardia 09/24/2014  . Dehydration 09/24/2014  . Gastroesophageal reflux disease without  esophagitis 09/01/2014  . Hx of adenomatous colonic polyps 09/01/2014  . Abdominal pain 11/05/2012  . Crohn's regional enteritis ([place]) 11/05/2012  . Nausea and vomiting in adult 11/05/2012  . Blood loss anemia 11/05/2012    Past Surgical History:  Procedure Laterality Date  . COLON RESECTION N/A 05/11/2015   Procedure: Laparotomy with resection of Chron's Disease small bowel resection biopsy mesenteric nodule;  Surgeon: Fanny Skates, MD;  Location: [facility];  Service: General;  Laterality: N/A;  . COLONOSCOPY    . COLONOSCOPY W/ BIOPSIES  2015  . LAPAROSCOPIC LYSIS OF ADHESIONS  02/2017  . 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 Right Heel surgery     with plates and screws  . wisdom teeth extracted       OB History   None      Home Medications    Prior to Admission medications   Medication Sig Start Date End Date Taking? Authorizing Provider  acetaminophen (TYLENOL) 500 MG tablet Take 1,500 mg by mouth every 6 (six) hours as needed for moderate pain or headache.   Yes [provider]  dicyclomine (BENTYL) 20 MG tablet Take 1 tablet (20 mg total) by mouth every 6 (six) hours. 09/27/18  Yes Levin Erp, PA  medroxyPROGESTERone (DEPO-PROVERA) 150 MG/ML injection Inject 150 mg into the muscle every 3 (three) months.   Yes [provider]  ondansetron (ZOFRAN) 4 MG tablet Take 1 tablet (4 mg total) by mouth every 6 (six) hours  as needed for nausea or vomiting. 11/06/18  Yes Pyrtle, Lajuan Lines, MD  predniSONE (DELTASONE) 10 MG tablet Take 4 tablets (40 mg total) by mouth daily for 14 days, THEN 3.5 tablets (35 mg total) daily for 7 days, THEN 3 tablets (30 mg total) daily for 7 days, THEN 2.5 tablets (25 mg total) daily for 7 days, THEN 2 tablets (20 mg total) daily with breakfast for 7 days, THEN 1.5 tablets (15 mg total) daily for 7 days, THEN 1 tablet (10 mg total) daily for 7 days, THEN 0.5 tablets (5 mg total) daily for 7 days. 09/27/18 11/29/18 Yes Lemmon, Lavone Nian, PA  promethazine (PHENERGAN) 25 MG tablet Take 1 tablet (25 mg total) by mouth every 8 (eight) hours as needed for nausea or vomiting. Patient not taking: Reported on 11/07/2018 10/15/18   Dorie Rank, MD  temazepam (RESTORIL) 30 MG capsule Take 1 capsule (30 mg total) by mouth at bedtime as needed for sleep. Patient not taking: Reported on 11/07/2018 10/24/18   Pyrtle, Lajuan Lines, MD    Family History Family History  Problem Relation Age of Onset  . Colon cancer Maternal Grandmother 48  . Breast cancer Mother 66  . Esophageal cancer Neg Hx   . Stomach cancer Neg Hx   . Rectal cancer Neg Hx   . Pancreatic cancer Neg Hx     Social History Social History   Tobacco Use  . Smoking status: Never Smoker  . Smokeless tobacco: Never Used  Substance Use Topics  . Alcohol use: Yes    Alcohol/week: 0.0 standard drinks    Comment: 3 times per week  . Drug use: No     Allergies   Morphine and related   Review of Systems Review of Systems  Constitutional: Negative for chills and fever.  HENT: Negative for ear pain and sore throat.   Eyes: Negative for pain and visual disturbance.  Respiratory: Negative for cough and shortness of breath.   Cardiovascular: Negative for chest pain and palpitations.  Gastrointestinal: Positive for abdominal pain (chronic) and vomiting. Negative for diarrhea.  Genitourinary: Negative for dysuria and hematuria.  Musculoskeletal: Negative for arthralgias, back pain and myalgias.  Skin: Negative for color change and rash.  Neurological: Negative for seizures, syncope and headaches.  All other systems reviewed and are negative.    Physical Exam Updated Vital Signs  ED Triage Vitals  Enc Vitals Group     BP 11/07/18 0946 139/87     Pulse Rate 11/07/18 0946 88     Resp 11/07/18 0946 16     Temp 11/07/18 0946 98.7 F (37.1 C)     Temp src --      SpO2 11/07/18 0946 99 %     Weight --      Height --      Head Circumference --      Peak Flow --       Pain Score 11/07/18 0943 9     Pain Loc --      Pain Edu? --      Excl. in [place]? --     Physical Exam  Constitutional: She is oriented to person, place, and time. She appears well-developed and well-nourished. No distress.  HENT:  Head: Normocephalic and atraumatic.  Eyes: Conjunctivae and EOM are normal.  Neck: Normal range of motion. Neck supple.  Cardiovascular: Normal rate, regular rhythm, normal heart sounds and intact distal pulses.  No murmur heard. Pulmonary/Chest: Effort normal and breath sounds  normal. No respiratory distress.  Abdominal: Soft. Bowel sounds are normal. She exhibits no distension and no mass. There is no tenderness. There is no rebound and no guarding. No hernia.  Musculoskeletal: Normal range of motion. She exhibits no edema.  Neurological: She is alert and oriented to person, place, and time.  Skin: Skin is warm and dry. Capillary refill takes less than 2 seconds.  Psychiatric: She has a normal mood and affect.  Nursing note and vitals reviewed.    ED Treatments / Results  Labs (all labs ordered are listed, but only abnormal results are displayed) Labs Reviewed  COMPREHENSIVE METABOLIC PANEL - Abnormal; Notable for the following components:      Result Value   Glucose, Bld 109 (*)    Total Protein 8.4 (*)    All other components within normal limits  URINALYSIS, ROUTINE W REFLEX MICROSCOPIC - Abnormal; Notable for the following components:   APPearance CLOUDY (*)    Specific Gravity, Urine 1.036 (*)    Hgb urine dipstick SMALL (*)    Protein, ur 30 (*)    Bacteria, UA RARE (*)    All other components within normal limits  LIPASE, BLOOD  CBC  I-STAT BETA HCG BLOOD, ED ([facility], [facility], [facility] ONLY)    EKG None  Radiology Dg Abdomen 1 View  Result Date: 11/07/2018 CLINICAL DATA:  47 year old female with bloating and vomiting for 3 days. EXAM: ABDOMEN - 1 VIEW COMPARISON:  CT Abdomen and Pelvis 09/25/2018 and earlier. FINDINGS: Supine views. Non  obstructed bowel gas pattern. Stable small pelvic phleboliths. Normal abdominal and pelvic visceral contours. No acute osseous abnormality identified. IMPRESSION: Negative. Electronically Signed   By: Genevie Ann M.D.   On: 11/07/2018 13:08    Procedures Procedures (including critical care time)  Medications Ordered in ED Medications  lactated ringers bolus 1,000 mL ( Intravenous Stopped 11/07/18 1243)  prochlorperazine (COMPAZINE) injection 10 mg (10 mg Intravenous Given 11/07/18 1127)  diphenhydrAMINE (BENADRYL) capsule 50 mg (50 mg Oral Given 11/07/18 1134)     Initial Impression / Assessment and Plan / ED Course  I have reviewed the triage vital signs and the nursing notes.  Pertinent labs & imaging results that were available during my care of the patient were reviewed by me and considered in my medical decision making (see chart for details).     Tyjai Charbonnet is a 47 year old female with history of Crohn's disease currently on steroids who presents to the ED with nausea.  Patient with normal vitals.  No fever.  Patient with nausea vomiting for the last several days.  Has had some abdominal bloating but no pain.  She denies any hematemesis, melena, hematochezia.  She has no abdominal tenderness on exam.  She states that she is having good bowel movements.  She is passing gas.  Concerned about suspicious food intake/ill contacts.  She has not had any diarrhea.  She is overall well-appearing.  Well-hydrated.  Will obtain lab work including KUB to evaluate for any signs of electrolyte abnormality, dehydration.  Low concern for appendicitis, obstruction at this time.  Patient with no significant anemia, electrolyte abnormality, kidney injury.  Lipase within normal limits.  Patient with no significant leukocytosis.  KUB overall unremarkable.  Patient given Compazine, Benadryl, lactated Ringer bolus with improvement of symptoms.  Recommend continued use of Zofran at home, increase hydration.   Close follow-up with primary care doctor and told to return to the ED if symptoms worsen including fever, abdominal pain.  This chart was dictated using voice recognition software.  Despite best efforts to proofread,  errors can occur which can change the documentation meaning.   Final Clinical Impressions(s) / ED Diagnoses   Final diagnoses:  Vomiting without nausea, intractability of vomiting not specified, unspecified vomiting type    ED Discharge Orders    None       Lennice Sites, DO 11/07/18 1320

## 2018-11-22 ENCOUNTER — Other Ambulatory Visit: Payer: Self-pay

## 2018-11-22 DIAGNOSIS — K50918 Crohn's disease, unspecified, with other complication: Secondary | ICD-10-CM

## 2018-11-22 MED ORDER — TEMAZEPAM 30 MG PO CAPS: 30.0000 mg | ORAL_CAPSULE | Freq: Every evening | ORAL | 1 refills | Status: DC | PRN

## 2018-11-22 MED ORDER — DICYCLOMINE HCL 20 MG PO TABS: 20.0000 mg | ORAL_TABLET | Freq: Four times a day (QID) | ORAL | 2 refills | Status: DC

## 2018-11-27 ENCOUNTER — Ambulatory Visit (INDEPENDENT_AMBULATORY_CARE_PROVIDER_SITE_OTHER): Payer: Self-pay | Admitting: Internal Medicine

## 2018-11-27 DIAGNOSIS — K5090 Crohn's disease, unspecified, without complications: Secondary | ICD-10-CM

## 2018-11-27 NOTE — Progress Notes (Signed)
Pt here for patency capsule. Pt swallowed capsule without difficulty. Pt knows to come for KUB tomorrow, order is in epic.  [id number] Exp:  10/25/2019

## 2018-11-28 ENCOUNTER — Telehealth: Payer: Self-pay | Admitting: Internal Medicine

## 2018-11-28 NOTE — Telephone Encounter (Signed)
Pt states she passed the patency capsule that she swallowed yesterday, she did not have to come for KUB.

## 2018-11-28 NOTE — Telephone Encounter (Signed)
Great We can proceed with standard video capsule endoscopy for small bowel Crohn's disease

## 2018-12-09 NOTE — Telephone Encounter (Signed)
Left message for pt to call back to schedule capsule endo.

## 2018-12-17 ENCOUNTER — Telehealth: Payer: Self-pay | Admitting: Internal Medicine

## 2018-12-17 NOTE — Telephone Encounter (Signed)
Pt returning your call

## 2018-12-17 NOTE — Telephone Encounter (Signed)
Spoke with pt and she is scheduled for capsule endo 12/23/18@8am . Pt will come by the office to pick up instructions.

## 2018-12-17 NOTE — Telephone Encounter (Signed)
Left message for pt to call back  °

## 2018-12-17 NOTE — Telephone Encounter (Signed)
See additional phone note. 

## 2018-12-18 DIAGNOSIS — S065X9A Traumatic subdural hemorrhage with loss of consciousness of unspecified duration, initial encounter: Secondary | ICD-10-CM

## 2018-12-18 DIAGNOSIS — S065XAA Traumatic subdural hemorrhage with loss of consciousness status unknown, initial encounter: Secondary | ICD-10-CM

## 2018-12-18 HISTORY — DX: Traumatic subdural hemorrhage with loss of consciousness status unknown, initial encounter: S06.5XAA

## 2018-12-18 HISTORY — DX: Traumatic subdural hemorrhage with loss of consciousness of unspecified duration, initial encounter: S06.5X9A

## 2018-12-20 ENCOUNTER — Telehealth: Payer: Self-pay | Admitting: Internal Medicine

## 2018-12-20 NOTE — Telephone Encounter (Signed)
Pt states she is having a lot of abdominal cramping and is having to take 2 of the bentyl to help. Wants to know if something different can be called in for her. Please advise.

## 2018-12-20 NOTE — Telephone Encounter (Signed)
Maximum dose of dicyclomine is 40 mg up to 4 times per day; thus it would be okay for her to use two 20 mg tablets if needed. Another option would be Robinul Forte 2 mg every 12 hours  She should be having video capsule endoscopy in the next few days which should help guide treatment decisions given her history of Crohn's enteritis.  She passed a patency capsule without problem

## 2018-12-23 ENCOUNTER — Other Ambulatory Visit: Payer: Self-pay

## 2018-12-23 ENCOUNTER — Ambulatory Visit (INDEPENDENT_AMBULATORY_CARE_PROVIDER_SITE_OTHER): Payer: 59 | Admitting: Internal Medicine

## 2018-12-23 ENCOUNTER — Encounter: Payer: Self-pay | Admitting: Internal Medicine

## 2018-12-23 DIAGNOSIS — K50019 Crohn's disease of small intestine with unspecified complications: Secondary | ICD-10-CM

## 2018-12-23 MED ORDER — GLYCOPYRROLATE 2 MG PO TABS: 2.0000 mg | ORAL_TABLET | Freq: Two times a day (BID) | ORAL | 3 refills | Status: DC

## 2018-12-23 NOTE — Progress Notes (Signed)
Pt here for capsule endo, pt completed prep without difficulty. Pt swallowed capsule without difficulty. Pt knows to retrieve capsule and to return it via fedex.   Serial number [id number] Exp:  03/08/20

## 2018-12-23 NOTE — Telephone Encounter (Signed)
Pt aware and script sent to pharmacy. 

## 2018-12-23 NOTE — Patient Instructions (Signed)
  You may have clear liquids beginning at 10:30 am after ingesting the capsule.    At 1:00 pm mix 17 GMs of Miralax in 8 oz of liquid,  of your choice,  and drink. You may have a light lunch of half a sandwich and a bowl of soup. You may have a normal dinner after 5:00 pm.  No further dietary restrictions are necessary.  You should pass the capsule in your stool 8-48 hours after ingestion. If you have not passed the capsule, after 72 hours, please contact the office at [phone].  Please watch the retrieval video at [email or web address].  It is imperative to retrieve the capsule to avoid repeating the test.  Please keep your capsule retrieval kit with you at all times when visiting the restroom until you have retrieved the capsule.   Please follow the instructions for returning the capsule:  Place the retrieved capsule from the take-home retrieval kit in the provided vial.  Ensure the vial lid is locked and put the vial into the envelope, seal the pre-labeled envelope, and drop if off at any FedEx drop box or physical FedEx location.    

## 2019-01-03 ENCOUNTER — Telehealth: Payer: Self-pay

## 2019-01-03 NOTE — Telephone Encounter (Signed)
Spoke with pt regarding capsule endo results that was negative and showed no active crohns. Pt aware pain most likely related to adhesions and that if pain becomes more frequent or continues suggest she return to Dr. Drue Flirt to discuss further. Pt verbalized understanding.

## 2019-01-14 ENCOUNTER — Other Ambulatory Visit: Payer: Self-pay | Admitting: Internal Medicine

## 2019-01-28 ENCOUNTER — Telehealth: Payer: Self-pay | Admitting: Internal Medicine

## 2019-01-28 MED ORDER — TEMAZEPAM 30 MG PO CAPS: 30.0000 mg | ORAL_CAPSULE | Freq: Every evening | ORAL | 1 refills | Status: DC | PRN

## 2019-01-28 NOTE — Telephone Encounter (Signed)
Pt is requesting rf for temazepan sent to [facility] on [address].

## 2019-01-28 NOTE — Telephone Encounter (Signed)
Rx created and awaiting Dr Pyrtle's signature. 

## 2019-03-20 ENCOUNTER — Telehealth: Payer: Self-pay | Admitting: Internal Medicine

## 2019-03-20 MED ORDER — TEMAZEPAM 30 MG PO CAPS: 30.0000 mg | ORAL_CAPSULE | Freq: Every evening | ORAL | 1 refills | Status: DC | PRN

## 2019-03-20 NOTE — Telephone Encounter (Signed)
Patient would like to get a refill for temazepam

## 2019-03-20 NOTE — Telephone Encounter (Signed)
Rx sent 

## 2019-05-22 ENCOUNTER — Other Ambulatory Visit: Payer: Self-pay | Admitting: Internal Medicine

## 2019-05-23 ENCOUNTER — Emergency Department (HOSPITAL_COMMUNITY)
Admission: EM | Admit: 2019-05-23 | Discharge: 2019-05-23 | Disposition: A | Discharge status: Home / Self Care | Payer: 59 | Attending: Emergency Medicine | Admitting: Emergency Medicine

## 2019-05-23 ENCOUNTER — Emergency Department (HOSPITAL_COMMUNITY): Payer: 59

## 2019-05-23 ENCOUNTER — Other Ambulatory Visit: Payer: Self-pay

## 2019-05-23 ENCOUNTER — Encounter (HOSPITAL_COMMUNITY): Payer: Self-pay

## 2019-05-23 DIAGNOSIS — R1084 Generalized abdominal pain: Secondary | ICD-10-CM

## 2019-05-23 DIAGNOSIS — R1114 Bilious vomiting: Secondary | ICD-10-CM

## 2019-05-23 DIAGNOSIS — Z79899 Other long term (current) drug therapy: Secondary | ICD-10-CM | POA: Insufficient documentation

## 2019-05-23 LAB — COMPREHENSIVE METABOLIC PANEL
ALT: 15 U/L (ref 0–44)
AST: 14 U/L — ABNORMAL LOW (ref 15–41)
Albumin: 4.5 g/dL (ref 3.5–5.0)
Alkaline Phosphatase: 45 U/L (ref 38–126)
Anion gap: 7 (ref 5–15)
BUN: 18 mg/dL (ref 6–20)
CO2: 24 mmol/L (ref 22–32)
Calcium: 9.2 mg/dL (ref 8.9–10.3)
Chloride: 105 mmol/L (ref 98–111)
Creatinine, Ser: 0.68 mg/dL (ref 0.44–1.00)
GFR calc Af Amer: >60 mL/min (ref >60)
GFR calc non Af Amer: >60 mL/min (ref >60)
Glucose, Bld: 122 mg/dL — ABNORMAL HIGH (ref 70–99)
Potassium: 3.8 mmol/L (ref 3.5–5.1)
Sodium: 136 mmol/L (ref 135–145)
Total Bilirubin: 0.7 mg/dL (ref 0.3–1.2)
Total Protein: 8 g/dL (ref 6.5–8.1)

## 2019-05-23 LAB — URINALYSIS, ROUTINE W REFLEX MICROSCOPIC
Bilirubin Urine: NEGATIVE
Glucose, UA: NEGATIVE mg/dL
Hgb urine dipstick: NEGATIVE
Ketones, ur: NEGATIVE mg/dL
Leukocytes,Ua: NEGATIVE
Nitrite: NEGATIVE
Protein, ur: NEGATIVE mg/dL
Specific Gravity, Urine: 1.032 — ABNORMAL HIGH (ref 1.005–1.030)
pH: 5 (ref 5.0–8.0)

## 2019-05-23 LAB — CBC WITH DIFFERENTIAL/PLATELET
Abs Immature Granulocytes: 0.03 10*3/uL (ref 0.00–0.07)
Basophils Absolute: 0.1 10*3/uL (ref 0.0–0.1)
Basophils Relative: 1 %
Eosinophils Absolute: 0.1 10*3/uL (ref 0.0–0.5)
Eosinophils Relative: 1 %
HCT: 41.3 % (ref 36.0–46.0)
Hemoglobin: 13.4 g/dL (ref 12.0–15.0)
Immature Granulocytes: 0 %
Lymphocytes Relative: 18 %
Lymphs Abs: 1.4 10*3/uL (ref 0.7–4.0)
MCH: 30.9 pg (ref 26.0–34.0)
MCHC: 32.4 g/dL (ref 30.0–36.0)
MCV: 95.4 fL (ref 80.0–100.0)
Monocytes Absolute: 0.5 10*3/uL (ref 0.1–1.0)
Monocytes Relative: 7 %
Neutro Abs: 6 10*3/uL (ref 1.7–7.7)
Neutrophils Relative %: 73 %
Platelets: 292 10*3/uL (ref 150–400)
RBC: 4.33 MIL/uL (ref 3.87–5.11)
RDW: 12.7 % (ref 11.5–15.5)
WBC: 8.2 10*3/uL (ref 4.0–10.5)
nRBC: 0 % (ref 0.0–0.2)

## 2019-05-23 LAB — I-STAT BETA HCG BLOOD, ED (MC, WL, AP ONLY): I-stat hCG, quantitative: <5 m[IU]/mL (ref <5)

## 2019-05-23 LAB — LIPASE, BLOOD: Lipase: 34 U/L (ref 11–51)

## 2019-05-23 MED ORDER — ONDANSETRON HCL 4 MG/2ML IJ SOLN
4.0000 mg | Freq: Once | INTRAMUSCULAR | Status: AC
  Administered 2019-05-23: 4 mg via INTRAVENOUS
  Filled 2019-05-23: qty 2

## 2019-05-23 MED ORDER — ONDANSETRON HCL 4 MG PO TABS: 4.0000 mg | ORAL_TABLET | Freq: Four times a day (QID) | ORAL | 0 refills | Status: DC

## 2019-05-23 MED ORDER — SODIUM CHLORIDE 0.9 % IV BOLUS
1000.0000 mL | Freq: Once | INTRAVENOUS | Status: AC
  Administered 2019-05-23: 1000 mL via INTRAVENOUS

## 2019-05-23 MED ORDER — DICYCLOMINE HCL 20 MG PO TABS: 20.0000 mg | ORAL_TABLET | Freq: Two times a day (BID) | ORAL | 0 refills | Status: DC

## 2019-05-23 MED ORDER — HYDROMORPHONE HCL 1 MG/ML IJ SOLN
0.5000 mg | Freq: Once | INTRAMUSCULAR | Status: AC
  Administered 2019-05-23: 0.5 mg via INTRAVENOUS
  Filled 2019-05-23: qty 1

## 2019-05-23 MED ORDER — SODIUM CHLORIDE (PF) 0.9 % IJ SOLN
INTRAMUSCULAR | Status: AC
  Filled 2019-05-23: qty 50

## 2019-05-23 MED ORDER — IOHEXOL 300 MG/ML  SOLN
100.0000 mL | Freq: Once | INTRAMUSCULAR | Status: AC | PRN
  Administered 2019-05-23: 100 mL via INTRAVENOUS

## 2019-05-23 NOTE — ED Provider Notes (Signed)
[facility] DEPT Provider Note   CSN: [id number] Arrival date & time: 05/23/19  2703    History   Chief Complaint Chief Complaint  Patient presents with   Emesis    HPI Meghan Charles is a 48 y.o. female with PMHx Crohn's who presents to the ED complaining of gradual onset, constant, right sided abdominal pain, nausea, and nonbloody nonbilious emesis x 3-4 days. Pt also complaining of constipation for the past 2 days but is still passing gas. She reports that the glycopyrrol typically works for her nausea/vomiting but it has not been helping. She has not taken anything to ease her constipation. Denies fever, chills, chest pain, shortness of breath, urinary sx, or any other associated symptoms. No recent abx use. No suspicious food intake.        Past Medical History:  Diagnosis Date   Allergy    seasonal   Anxiety    Colon obstruction ([place])    Colon polyps 05/2014   TUBULAR ADENOMA AND HYPERPLASTIC POLYP.   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   RLQ abdominal pain    Pollen allergies 03/26/2015   Immunosuppressed status ([place]) 03/26/2015   Exacerbation of Crohn's disease ([place])    High risk medication use    Diarrhea 01/18/2015   Crohn's colitis ([place]) 12/26/2014   Essential hypertension    CAP (community acquired pneumonia) 10/02/2014   Tachycardia 10/02/2014   Physical deconditioning 10/02/2014   PNA (pneumonia) 09/24/2014   Pleural effusion 09/24/2014   Nausea with vomiting 09/24/2014   Leukocytosis 09/24/2014   Sinus tachycardia 09/24/2014   Dehydration 09/24/2014   Gastroesophageal reflux disease without esophagitis  09/01/2014   Hx of adenomatous colonic polyps 09/01/2014   Abdominal pain 11/05/2012   Crohn's regional enteritis ([place]) 11/05/2012   Nausea and vomiting in adult 11/05/2012   Blood loss anemia 11/05/2012    Past Surgical History:  Procedure Laterality Date   COLON RESECTION N/A 05/11/2015   Procedure: Laparotomy with resection of Chron's Disease small bowel resection biopsy mesenteric nodule;  Surgeon: Fanny Skates, MD;  Location: [facility];  Service: General;  Laterality: N/A;   COLONOSCOPY     COLONOSCOPY W/ BIOPSIES  2015   LAPAROSCOPIC LYSIS OF ADHESIONS  02/2017   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      Home Medications    Prior to Admission medications   Medication Sig Start Date End Date Taking? Authorizing Provider  acetaminophen (TYLENOL) 500 MG tablet Take 1,500 mg by mouth every 6 (six) hours as needed for moderate pain or headache.   Yes [provider]  glycopyrrolate (ROBINUL) 2 MG tablet Take 1 tablet (2 mg total) by mouth 2 (two) times daily. 12/23/18  Yes Pyrtle, Lajuan Lines, MD  medroxyPROGESTERone (DEPO-PROVERA) 150 MG/ML injection Inject 150 mg into the muscle every 3 (three) months.   Yes [provider]  temazepam (RESTORIL) 30 MG capsule TAKE 1 CAPSULE BY MOUTH AT BEDTIME AS NEEDED FOR SLEEP Patient taking differently:  Take 30 mg by mouth at bedtime as needed for sleep.  05/22/19  Yes Pyrtle, Lajuan Lines, MD  dicyclomine (BENTYL) 20 MG tablet Take 1 tablet (20 mg total) by mouth every 6 (six) hours. Patient not taking: Reported on 05/23/2019 11/22/18   Pyrtle, Lajuan Lines, MD  dicyclomine (BENTYL) 20 MG tablet Take 1 tablet (20 mg total) by mouth 2 (two) times daily. 05/23/19   Morgann [place]burn, PA-C  ondansetron (ZOFRAN) 4 MG tablet TAKE 1 TABLET BY MOUTH EVERY 6 HOURS AS NEEDED FOR NAUSEA FOR VOMITING Patient not taking: Reported on  05/23/2019 01/15/19   Pyrtle, Lajuan Lines, MD  ondansetron (ZOFRAN) 4 MG tablet Take 1 tablet (4 mg total) by mouth every 6 (six) hours. 05/23/19   Eustaquio Maize, PA-C  promethazine (PHENERGAN) 25 MG tablet Take 1 tablet (25 mg total) by mouth every 8 (eight) hours as needed for nausea or vomiting. Patient not taking: Reported on 11/07/2018 10/15/18   Dorie Rank, MD    Family History Family History  Problem Relation Age of Onset   Colon cancer Maternal Grandmother 70   Breast cancer Mother 32   Esophageal cancer Neg Hx    Stomach cancer Neg Hx    Rectal cancer Neg Hx    Pancreatic cancer Neg Hx     Social History Social History   Tobacco Use   Smoking status: Never Smoker   Smokeless tobacco: Never Used  Substance Use Topics   Alcohol use: Yes    Alcohol/week: 0.0 standard drinks    Comment: 3 times per week   Drug use: No     Allergies   Morphine and related   Review of Systems Review of Systems  Constitutional: Negative for chills and fever.  HENT: Negative for congestion.   Eyes: Negative for redness.  Respiratory: Negative for cough and shortness of breath.   Cardiovascular: Negative for chest pain.  Gastrointestinal: Positive for abdominal pain, constipation, nausea and vomiting. Negative for blood in stool and diarrhea.  Genitourinary: Negative for dysuria, flank pain and frequency.  Musculoskeletal: Negative for myalgias.  Skin: Negative for rash.  Neurological: Negative for dizziness and light-headedness.     Physical Exam Updated Vital Signs BP (!) 145/103 (BP Location: Right Arm)    Pulse (!) 117    Temp 98.6 F (37 C) (Oral)    Resp 16    Ht 5' 8"  (1.727 m)    Wt 68 kg    SpO2 98%    BMI 22.81 kg/m   Physical Exam Vitals signs and nursing note reviewed.  Constitutional:      Appearance: She is not ill-appearing or diaphoretic.  HENT:     Head: Normocephalic and atraumatic.  Eyes:     Conjunctiva/sclera: Conjunctivae normal.  Neck:      Musculoskeletal: Neck supple.  Cardiovascular:     Rate and Rhythm: Regular rhythm. Tachycardia present.     Pulses: Normal pulses.  Pulmonary:     Effort: Pulmonary effort is normal.     Breath sounds: Normal breath sounds. No wheezing, rhonchi or rales.  Abdominal:     Palpations: Abdomen is soft.     Tenderness: There is abdominal tenderness.     Comments: Abdomen nondistended; active bowel sounds in all 4 quadrants; tenderness to palpation to RLQ and RUQ; no rebound or guarding  Skin:    General: Skin is warm and dry.  Neurological:     Mental Status: She is alert.      ED  Treatments / Results  Labs (all labs ordered are listed, but only abnormal results are displayed) Labs Reviewed  COMPREHENSIVE METABOLIC PANEL - Abnormal; Notable for the following components:      Result Value   Glucose, Bld 122 (*)    AST 14 (*)    All other components within normal limits  URINALYSIS, ROUTINE W REFLEX MICROSCOPIC - Abnormal; Notable for the following components:   APPearance HAZY (*)    Specific Gravity, Urine 1.032 (*)    All other components within normal limits  LIPASE, BLOOD  CBC WITH DIFFERENTIAL/PLATELET  I-STAT BETA HCG BLOOD, ED ([facility], [facility], [facility] ONLY)    EKG None  Radiology Ct Abdomen Pelvis W Contrast  Result Date: 05/23/2019 CLINICAL DATA:  Right-sided abdominal pain. EXAM: CT ABDOMEN AND PELVIS WITH CONTRAST TECHNIQUE: Multidetector CT imaging of the abdomen and pelvis was performed using the standard protocol following bolus administration of intravenous contrast. CONTRAST:  189m OMNIPAQUE IOHEXOL 300 MG/ML  SOLN COMPARISON:  09/25/2018 FINDINGS: Lower chest: No acute abnormality. Hepatobiliary: No focal liver abnormality is seen. No gallstones, gallbladder wall thickening, or biliary dilatation. Pancreas: Unremarkable. No pancreatic ductal dilatation or surrounding inflammatory changes. Spleen: Normal in size without focal abnormality. Adrenals/Urinary Tract: Stable 2.6 cm  right adrenal mass likely reflecting an adrenal adenoma. Punctate nonobstructing left renal calculus. Kidneys otherwise normal. No ureteral calculi. No renal mass. Bladder is unremarkable. Stomach/Bowel: Small hiatal hernia. Stomach is otherwise unremarkable. Appendix appears normal. No evidence of bowel wall thickening, distention, or inflammatory changes. Vascular/Lymphatic: No significant vascular findings are present. No enlarged abdominal or pelvic lymph nodes. Reproductive: Uterus and bilateral adnexa are unremarkable. Other: No abdominal wall hernia or abnormality. No abdominopelvic ascites. Musculoskeletal: No acute osseous abnormality. No aggressive osseous lesion. IMPRESSION: 1. No acute abdominal or pelvic pathology. 2. Normal appendix. Electronically Signed   By: HKathreen Devoid  On: 05/23/2019 11:42    Procedures Procedures (including critical care time)  Medications Ordered in ED Medications  sodium chloride 0.9 % bolus 1,000 mL (0 mLs Intravenous Stopped 05/23/19 1200)  HYDROmorphone (DILAUDID) injection 0.5 mg (0.5 mg Intravenous Given 05/23/19 0950)  ondansetron (ZOFRAN) injection 4 mg (4 mg Intravenous Given 05/23/19 0949)  iohexol (OMNIPAQUE) 300 MG/ML solution 100 mL (100 mLs Intravenous Contrast Given 05/23/19 1056)     Initial Impression / Assessment and Plan / ED Course  I have reviewed the triage vital signs and the nursing notes.  Pertinent labs & imaging results that were available during my care of the patient were reviewed by me and considered in my medical decision making (see chart for details).    Pt is a 48year old female with hx of Crohns presenting with abdominal pain/nausea/vomiting/constipation x 4 days. Still passing gas. Hx of SBO in the past about 2 years ago. Has tenderness to right side abdomen as well; no peritoneal signs. Low suspicion for acute abdomen at this point in time. Concern for SBO vs crohn's flare. Will get baseline labs including CBC, CMP, lipase,  and U/A as well as CT A/P given tenderness. Pain medication, antiemetics, and 1 L NS bolus given for comfort as well. Will reevaluate once labs and imaging return.   CBC without leukocytosis; electrolytes within normal limits; kidney function and liver function tests normal; lipase negative. U/A clear as well. Upon reevaluation pt states she has not vomited while in the ED and is feeling better; still awaiting radiology read of CT scan but suspect patient can be discharged home with  meds for nausea.   CT scan negative at this time; difficult to say what is causing pain; this may be a flare up of her crohns; she has not been on any medication for it in over a year including 5-ASA or steroids; will PO challenge patient prior to discharge. Will send with Rx Zofran and Bentyl and advised to follow up with GI Dr. Hilarie Fredrickson regarding ED visit. Pt stable for discharge at this time.        Final Clinical Impressions(s) / ED Diagnoses   Final diagnoses:  Generalized abdominal pain  Bilious vomiting with nausea    ED Discharge Orders         Ordered    ondansetron (ZOFRAN) 4 MG tablet  Every 6 hours     05/23/19 1230    dicyclomine (BENTYL) 20 MG tablet  2 times daily     05/23/19 1230           Eustaquio Maize, PA-C 05/23/19 1616    Davonna Belling, MD 05/24/19 1114

## 2019-05-23 NOTE — ED Triage Notes (Signed)
Pt states that her crohn's is flaring up . Pt states she has been throwing up x 4 days. Pt states rx glycopyrrol that she was given has not helped. Pt states 6-7 episodes of emesis in last 24 hours.

## 2019-05-23 NOTE — Discharge Instructions (Signed)
You were seen in the ED today for abdominal pain, nausea, and vomiting. Your lab work and CT scan were reassuring; please take Zofran as needed for nausea and Bentyl as needed for abdominal pain. Please follow up with Dr. Hilarie Fredrickson. Return to the ED for any worsening symptoms including worsening pain or vomiting unable to be controlled with medication.

## 2019-09-10 ENCOUNTER — Telehealth: Payer: Self-pay | Admitting: Internal Medicine

## 2019-09-10 MED ORDER — TEMAZEPAM 30 MG PO CAPS: ORAL_CAPSULE | ORAL | 0 refills | Status: DC

## 2019-09-10 MED ORDER — ONDANSETRON HCL 4 MG PO TABS: ORAL_TABLET | ORAL | 0 refills | Status: DC

## 2019-09-10 NOTE — Telephone Encounter (Signed)
RX sent. Must have office visit for further refills.

## 2019-09-11 ENCOUNTER — Telehealth: Payer: Self-pay | Admitting: Internal Medicine

## 2019-09-11 ENCOUNTER — Other Ambulatory Visit: Payer: Self-pay

## 2019-09-11 MED ORDER — TEMAZEPAM 30 MG PO CAPS: ORAL_CAPSULE | ORAL | 0 refills | Status: DC

## 2019-09-12 NOTE — Telephone Encounter (Signed)
Rx was already picked up at [facility] location yesterday.

## 2019-10-14 ENCOUNTER — Telehealth: Payer: Self-pay

## 2019-10-14 ENCOUNTER — Other Ambulatory Visit: Payer: Self-pay

## 2019-10-14 DIAGNOSIS — R1031 Right lower quadrant pain: Secondary | ICD-10-CM

## 2019-10-14 DIAGNOSIS — K50019 Crohn's disease of small intestine with unspecified complications: Secondary | ICD-10-CM

## 2019-10-14 MED ORDER — Medication
5.00 | Status: DC
Start: ? — End: 2019-10-14

## 2019-10-14 MED ORDER — PROMETHAZINE HCL 25 MG PO TABS
25.00 | ORAL_TABLET | ORAL | Status: DC
Start: ? — End: 2019-10-14

## 2019-10-14 NOTE — Telephone Encounter (Signed)
Order in epic for fecal calprotectin. Pt scheduled for MR entero of A/P at [facility] 10/22/19@3pm , pt to arrive there at 1:30pm and be NPO after 11am. Left message for pt to call back.

## 2019-10-14 NOTE — Telephone Encounter (Signed)
Pt returned your call. Pls call her again.

## 2019-10-14 NOTE — Telephone Encounter (Signed)
Spoke with pt and she is aware of appt and knows to come to our lab for stool test. Order in epic.

## 2019-10-14 NOTE — Telephone Encounter (Signed)
Received call from Danise Mina NP [facility] ER) stating pt is there with RLQ abd pain, nausea and vomiting and loose stools but not bloody stools. States pts labs look good and they could do a CT if Dr. Hilarie Fredrickson wanted one. Also want to know Dr. Vena Rua thoughts regarding starting pt on prednisone until her OV. Per Dr. Hilarie Fredrickson he would like to get an MR enterography and fecal calprotectin prior to her OV and hold on CT scan and prednisone. NP aware and she will let pt know our office with be contacting her with MR entero appt prior to her scheduled ov.

## 2019-10-20 ENCOUNTER — Encounter: Payer: Self-pay | Admitting: *Deleted

## 2019-10-21 ENCOUNTER — Telehealth: Payer: Self-pay | Admitting: Internal Medicine

## 2019-10-21 NOTE — Telephone Encounter (Signed)
Pt given the phone number [phone] to call and see if she can have MRI moved to a date that works better for her.

## 2019-10-21 NOTE — Telephone Encounter (Signed)
Pt wants to know if she could have her MRI on Thursday. She is available any time on that day. Pls call her.

## 2019-10-22 ENCOUNTER — Ambulatory Visit (HOSPITAL_COMMUNITY)
Admission: RE | Admit: 2019-10-22 | Discharge: 2019-10-22 | Disposition: A | Discharge status: Home / Self Care | Payer: Self-pay | Source: Ambulatory Visit | Attending: Internal Medicine | Admitting: Internal Medicine

## 2019-10-22 ENCOUNTER — Other Ambulatory Visit: Payer: Self-pay

## 2019-10-22 DIAGNOSIS — R1031 Right lower quadrant pain: Secondary | ICD-10-CM

## 2019-10-22 DIAGNOSIS — K50019 Crohn's disease of small intestine with unspecified complications: Secondary | ICD-10-CM

## 2019-10-22 MED ORDER — GADOBUTROL 1 MMOL/ML IV SOLN
7.0000 mL | Freq: Once | INTRAVENOUS | Status: AC | PRN
  Administered 2019-10-22: 15:00:00 7 mL via INTRAVENOUS

## 2019-10-27 ENCOUNTER — Encounter: Payer: Self-pay | Admitting: Internal Medicine

## 2019-10-27 ENCOUNTER — Ambulatory Visit (INDEPENDENT_AMBULATORY_CARE_PROVIDER_SITE_OTHER): Payer: 59 | Admitting: Internal Medicine

## 2019-10-27 VITALS — BP 128/74 | HR 84 | Temp 97.9°F | Ht 68.0 in | Wt 144.4 lb

## 2019-10-27 DIAGNOSIS — R1031 Right lower quadrant pain: Secondary | ICD-10-CM

## 2019-10-27 DIAGNOSIS — R112 Nausea with vomiting, unspecified: Secondary | ICD-10-CM

## 2019-10-27 DIAGNOSIS — K50019 Crohn's disease of small intestine with unspecified complications: Secondary | ICD-10-CM

## 2019-10-27 DIAGNOSIS — G4700 Insomnia, unspecified: Secondary | ICD-10-CM

## 2019-10-27 MED ORDER — TEMAZEPAM 30 MG PO CAPS: ORAL_CAPSULE | ORAL | 1 refills | Status: DC

## 2019-10-27 MED ORDER — DICYCLOMINE HCL 20 MG PO TABS: 20.0000 mg | ORAL_TABLET | Freq: Two times a day (BID) | ORAL | 11 refills | Status: DC | PRN

## 2019-10-27 MED ORDER — PROMETHAZINE HCL 12.5 MG PO TABS: 12.5000 mg | ORAL_TABLET | Freq: Three times a day (TID) | ORAL | 11 refills | Status: DC | PRN

## 2019-10-27 NOTE — Progress Notes (Signed)
Subjective:    Patient ID: Meghan Charles, female    DOB: 1971-12-11, 48 y.o.   MRN: [id number]  HPI Meghan Charles is a 48 year old female with a history of Crohn's ileitis complicated by stricture status post 2 surgical resections (brief ileostomy subsequently reversed), history of adenomatous colon polyps, history of bilateral pneumonia with parapneumonic effusion in 2015 (occurred after 1 dose of Remicade), history of latent TB treated with INH therapy now completed therapy, history of subdural hematoma in August 2020 who is here for follow-up.  She is here alone today.  She reports that she has moved to [place] and shortly after being in her new apartment she fell down a flight of stairs in the middle of the night.  She feels she was disoriented in her new apartment.  She hit her head and had injury to the right side of her body.  She ended up being admitted to the hospital where she was found to have a subdural hematoma which did not require intervention.  She has recovered uneventfully.  She was emergency department several weeks ago and I was contacted by the ER there.  She presented with an episode of her typical right-sided abdominal pain with nausea and vomiting.  The question of whether she needed steroids was raised and I decided to perform an MR enterography rather than treat with steroids empirically.  Of note she had a colonoscopy last November to the terminal ileum which did not show any evidence of active Crohn's disease and she had negative capsule in January 2020.  The MR enterography was reviewed and did not show any evidence of active Crohn's disease in the small intestine or colon.  She does have episodic right-sided abdominal pain with nausea.  If it progresses the pain can be severe and lead to vomiting.  She has had 2 episodes approximately every 12 months.  There are other minor episodes 2-3 times a year which she is able to abort with promethazine and Bentyl.  In  between these episodes she feels very well with no abdominal pain.  Regular bowel movements.  No blood in her stool or melena.  Good appetite.   Review of Systems As per HPI, otherwise negative  Current Medications, Allergies, Past Medical History, Past Surgical History, Family History and Social History were reviewed in [employer] record.     Objective:   Physical Exam BP 128/74   Pulse 84   Temp 97.9 F (36.6 C) (Temporal)   Ht 5\' 8"  (1.727 m)   Wt 144 lb 6.4 oz (65.5 kg)   BMI 21.96 kg/m  Gen: awake, alert, NAD HEENT: anicteric, op clear CV: RRR, no mrg Pulm: CTA b/l Abd: soft, NT/ND, +BS throughout Ext: no c/c/e Neuro: nonfocal  CBC    Component Value Date/Time   WBC 8.2 05/23/2019 0952   RBC 4.33 05/23/2019 0952   HGB 13.4 05/23/2019 0952   HCT 41.3 05/23/2019 0952   PLT 292 05/23/2019 0952   MCV 95.4 05/23/2019 0952   MCH 30.9 05/23/2019 0952   MCHC 32.4 05/23/2019 0952   RDW 12.7 05/23/2019 0952   LYMPHSABS 1.4 05/23/2019 0952   MONOABS 0.5 05/23/2019 0952   EOSABS 0.1 05/23/2019 0952   BASOSABS 0.1 05/23/2019 0952   CMP     Component Value Date/Time   NA 136 05/23/2019 0952   K 3.8 05/23/2019 0952   CL 105 05/23/2019 0952   CO2 24 05/23/2019 0952   GLUCOSE 122 (H) 05/23/2019 [id number]  BUN 18 05/23/2019 0952   CREATININE 0.68 05/23/2019 0952   CALCIUM 9.2 05/23/2019 0952   PROT 8.0 05/23/2019 0952   ALBUMIN 4.5 05/23/2019 0952   AST 14 (L) 05/23/2019 0952   ALT 15 05/23/2019 0952   ALKPHOS 45 05/23/2019 0952   BILITOT 0.7 05/23/2019 0952   GFRNONAA >60 05/23/2019 0952   GFRAA >60 05/23/2019 0952    MR ABDOMEN AND PELVIS WITHOUT AND WITH CONTRAST (MR ENTEROGRAPHY)   TECHNIQUE: Multiplanar, multisequence MRI of the abdomen and pelvis was performed both before and during bolus administration of intravenous contrast. Negative oral contrast VoLumen was given.   CONTRAST:  74mL GADAVIST GADOBUTROL 1 MMOL/ML IV SOLN    COMPARISON:  05/23/2019 MR evaluation of 10/07/2016   FINDINGS: COMBINED FINDINGS FOR BOTH MR ABDOMEN AND PELVIS   Lower chest: Not well assessed. Imaging begins at the mid liver and extends through the pelvis.   Hepatobiliary: Imaged portions of liver are unremarkable. No signs of biliary ductal dilation.   Pancreas: No mass, inflammatory changes, or other parenchymal abnormality identified.   Spleen:  Within normal limits in size and appearance.   Adrenals/Urinary Tract: Benign appearing right adrenal lesion is unchanged.   Stomach/Bowel: Appearance of stomach, small and large bowel is normal aside from some stable sigmoid thickening. Appendix is normal.   There are signs of previous bowel resection with anastomosis in the right hemiabdomen.   Assessment of bowel with respect to enhancement is limited by marked bowel motion.   Vascular/Lymphatic:  Patent abdominal vasculature.   No signs of upper abdominal or retroperitoneal lymphadenopathy.   No signs of pelvic lymphadenopathy.   Reproductive: Uterus and bilateral adnexa are unremarkable.   Other:  None.   Musculoskeletal: No acute bone finding or destructive bone process.   IMPRESSION: 1. Stable thickening of the sigmoid colon in the setting of diverticular disease. 2. No signs of acute small bowel process. 3. Stable right adrenal lesion. Previously characterized as adrenal adenoma by report unchanged since 2015. Exam mildly limited by motion particularly on coronal images.     Electronically Signed   By: Zetta Bills M.D.   On: 10/22/2019 16:11     Assessment & Plan:  48 year old female with a history of Crohn's ileitis complicated by stricture status post 2 surgical resections (brief ileostomy subsequently reversed), history of adenomatous colon polyps, history of bilateral pneumonia with parapneumonic effusion in 2015 (occurred after 1 dose of Remicade), history of latent TB treated with INH therapy  now completed therapy, history of subdural hematoma in August 2020 who is here for follow-up.   1.  Ileal Crohn's disease --history of prior stricturing disease with prior bowel resection.  Of late she has had no evidence for active or recurrent Crohn's disease of the small intestine.  It is likely that her intermittent episodes of right-sided abdominal pain with nausea and vomiting are partial obstructive.  This is most likely to be secondary to abdominal adhesive disease given the lack of objective evidence of active inflammation in the small intestine by capsule study in January 2020 and recent MR enterography.  We discussed this today.  At present I do not think she needs biologic therapy. --Treat abdominal symptoms symptomatically which are felt to be more likely intermittent partial obstructive small bowel disease due to adhesions rather than active Crohn's disease --Promethazine 12.5 mg, 1 to 2 tablets every 6-8 hours as needed for nausea; ondansetron has been ineffective for moderate to severe nausea but can be used 4  mg every 8 hours as needed for mild nausea or early during an episode --Bentyl 20 mg every 8-12 hours as needed for crampy discomfort --Annual follow-up  2.  History of adenomatous colon polyps --surveillance colonoscopy 1 year ago, repeat surveillance colonoscopy recommended in November 2024  3.  Insomnia --continue temazepam 30 mg at bedtime as needed for sleep  25 minutes spent with the patient today. Greater than 50% was spent in counseling and coordination of care with the patient

## 2019-10-27 NOTE — Patient Instructions (Addendum)
If you are age 48 or older, your body mass index should be between 23-30. Your Body mass index is 21.96 kg/m. If this is out of the aforementioned range listed, please consider follow up with your Primary Care Provider.  If you are age 93 or younger, your body mass index should be between 19-25. Your Body mass index is 21.96 kg/m. If this is out of the aformentioned range listed, please consider follow up with your Primary Care Provider.   We have sent the following medications to your pharmacy for you to pick up at your convenience:  Restoril Phenergan Bentyl  Follow up in 1 year.

## 2020-02-03 ENCOUNTER — Telehealth: Payer: Self-pay | Admitting: Internal Medicine

## 2020-02-03 MED ORDER — TEMAZEPAM 30 MG PO CAPS: ORAL_CAPSULE | ORAL | 1 refills | Status: DC

## 2020-02-03 NOTE — Telephone Encounter (Signed)
Rx sent 

## 2020-05-03 ENCOUNTER — Telehealth: Payer: Self-pay | Admitting: Internal Medicine

## 2020-05-03 MED ORDER — TEMAZEPAM 30 MG PO CAPS: ORAL_CAPSULE | ORAL | 1 refills | Status: DC

## 2020-05-03 NOTE — Telephone Encounter (Signed)
Rx sent 

## 2020-05-04 ENCOUNTER — Other Ambulatory Visit: Payer: Self-pay | Admitting: Internal Medicine

## 2020-06-01 ENCOUNTER — Telehealth: Payer: Self-pay | Admitting: Internal Medicine

## 2020-06-01 NOTE — Telephone Encounter (Signed)
Pt states she has not been feeling well for the past couple of weeks. Reports she has been having a lot of nausea, getting sick every am. States her nausea meds have not been helping, she is having cramping and a little diarrhea but no blood. Reports she was seen in the ER recently and had CT scans and was told everything was ok. State her antinausea med is not helping. Please advise.

## 2020-06-01 NOTE — Telephone Encounter (Signed)
Pt reported that she is experiencing a Crohn's flare and would like to discuss.

## 2020-06-02 ENCOUNTER — Telehealth: Payer: Self-pay | Admitting: Internal Medicine

## 2020-06-02 ENCOUNTER — Other Ambulatory Visit: Payer: Self-pay

## 2020-06-02 MED ORDER — PANTOPRAZOLE SODIUM 40 MG PO TBEC: 40.0000 mg | DELAYED_RELEASE_TABLET | Freq: Every day | ORAL | 3 refills | Status: DC

## 2020-06-02 MED ORDER — RIFAXIMIN 550 MG PO TABS: 550.0000 mg | ORAL_TABLET | Freq: Three times a day (TID) | ORAL | 0 refills | Status: DC

## 2020-06-02 NOTE — Telephone Encounter (Signed)
Pt aware, scripts sent to pharmacy. PT scheduled to see Dr. Norman Herrlich 07/19/20@2 :30pm.

## 2020-06-02 NOTE — Telephone Encounter (Signed)
Let's give her a PPI trial, pantoprazole 40 mg once daily (30 min before she eats) Rifaximin 550 mg tid x 14 days (IBS) Office visit, next available, non-urgent for continuity I reviewed the CT result done recently at [facility] which is normal/reassuring She should keep me updated as to if the meds above help

## 2020-06-02 NOTE — Telephone Encounter (Signed)
Done

## 2020-06-10 ENCOUNTER — Other Ambulatory Visit: Payer: Self-pay | Admitting: *Deleted

## 2020-06-10 MED ORDER — ONDANSETRON HCL 4 MG PO TABS: ORAL_TABLET | ORAL | 0 refills | Status: DC

## 2020-06-15 NOTE — Telephone Encounter (Signed)
Patient called states the Xifaxan is too expensive please advise

## 2020-06-15 NOTE — Telephone Encounter (Signed)
Does patient have prescription medical coverage? If not perhaps we can provide samples if they are currently available? If not we can try different antibiotic, though rifaximin would likely have the best response

## 2020-06-17 NOTE — Telephone Encounter (Signed)
Samples of xifaxan up front for pt to pick up. Pt aware.

## 2020-06-17 NOTE — Telephone Encounter (Signed)
Pt would like to know what she would be taking Xifaxan for .

## 2020-07-01 ENCOUNTER — Other Ambulatory Visit: Payer: Self-pay | Admitting: Internal Medicine

## 2020-07-19 ENCOUNTER — Ambulatory Visit: Payer: Self-pay | Admitting: Internal Medicine

## 2020-07-19 ENCOUNTER — Telehealth: Payer: Self-pay | Admitting: Internal Medicine

## 2020-09-20 ENCOUNTER — Other Ambulatory Visit: Payer: Self-pay | Admitting: Internal Medicine

## 2021-02-08 ENCOUNTER — Encounter: Payer: Self-pay | Admitting: Nurse Practitioner

## 2021-02-08 ENCOUNTER — Ambulatory Visit (INDEPENDENT_AMBULATORY_CARE_PROVIDER_SITE_OTHER): Payer: 59 | Admitting: Nurse Practitioner

## 2021-02-08 ENCOUNTER — Other Ambulatory Visit: Payer: Self-pay

## 2021-02-08 ENCOUNTER — Other Ambulatory Visit (INDEPENDENT_AMBULATORY_CARE_PROVIDER_SITE_OTHER): Payer: 59

## 2021-02-08 VITALS — BP 122/88 | HR 115 | Ht 68.0 in | Wt 136.0 lb

## 2021-02-08 DIAGNOSIS — K5732 Diverticulitis of large intestine without perforation or abscess without bleeding: Secondary | ICD-10-CM

## 2021-02-08 DIAGNOSIS — K50019 Crohn's disease of small intestine with unspecified complications: Secondary | ICD-10-CM | POA: Diagnosis not present

## 2021-02-08 DIAGNOSIS — R1031 Right lower quadrant pain: Secondary | ICD-10-CM | POA: Diagnosis not present

## 2021-02-08 LAB — CBC WITH DIFFERENTIAL/PLATELET
Basophils Absolute: 0.1 10*3/uL (ref 0.0–0.1)
Basophils Relative: 0.7 % (ref 0.0–3.0)
Eosinophils Absolute: 0.1 10*3/uL (ref 0.0–0.7)
Eosinophils Relative: 0.8 % (ref 0.0–5.0)
HCT: 40.1 % (ref 36.0–46.0)
Hemoglobin: 13.6 g/dL (ref 12.0–15.0)
Lymphocytes Relative: 30.8 % (ref 12.0–46.0)
Lymphs Abs: 2.4 10*3/uL (ref 0.7–4.0)
MCHC: 33.8 g/dL (ref 30.0–36.0)
MCV: 89.2 fl (ref 78.0–100.0)
Monocytes Absolute: 0.4 10*3/uL (ref 0.1–1.0)
Monocytes Relative: 5.7 % (ref 3.0–12.0)
Neutro Abs: 4.8 10*3/uL (ref 1.4–7.7)
Neutrophils Relative %: 62 % (ref 43.0–77.0)
Platelets: 355 10*3/uL (ref 150.0–400.0)
RBC: 4.49 Mil/uL (ref 3.87–5.11)
RDW: 13 % (ref 11.5–15.5)
WBC: 7.7 10*3/uL (ref 4.0–10.5)

## 2021-02-08 LAB — C-REACTIVE PROTEIN: CRP: <1 mg/dL (ref 0.5–20.0)

## 2021-02-08 LAB — COMPREHENSIVE METABOLIC PANEL
ALT: 22 U/L (ref 0–35)
AST: 16 U/L (ref 0–37)
Albumin: 4.4 g/dL (ref 3.5–5.2)
Alkaline Phosphatase: 49 U/L (ref 39–117)
BUN: 12 mg/dL (ref 6–23)
CO2: 27 mEq/L (ref 19–32)
Calcium: 9.6 mg/dL (ref 8.4–10.5)
Chloride: 104 mEq/L (ref 96–112)
Creatinine, Ser: 0.75 mg/dL (ref 0.40–1.20)
GFR: 93.46 mL/min (ref >60.00)
Glucose, Bld: 94 mg/dL (ref 70–99)
Potassium: 4 mEq/L (ref 3.5–5.1)
Sodium: 138 mEq/L (ref 135–145)
Total Bilirubin: 0.4 mg/dL (ref 0.2–1.2)
Total Protein: 7.6 g/dL (ref 6.0–8.3)

## 2021-02-08 MED ORDER — DICYCLOMINE HCL 20 MG PO TABS: 20.0000 mg | ORAL_TABLET | Freq: Two times a day (BID) | ORAL | 0 refills | Status: DC | PRN

## 2021-02-08 MED ORDER — AMOXICILLIN-POT CLAVULANATE 875-125 MG PO TABS: 1.0000 | ORAL_TABLET | Freq: Two times a day (BID) | ORAL | 0 refills | Status: DC

## 2021-02-08 NOTE — Progress Notes (Signed)
[place]   [facility] Phone: [phone] Fax: [phone]   Patient Name: Meghan Charles DOB: 08-May-1971 MRN #: [id number]  Imaging Ordered: CT abd pelvis  Diagnosis: RLQ pain/flare up diverticulitis  Ordering Provider: Noralyn Pick, CRNP   Is a Prior Authorization needed? We are in the process of obtaining it now  Is the patient Diabetic? No  Does the patient have Hypertension? Yes  Does the patient have any implanted devices or hardware? No  Date of last BUN/Creat, if needed? 02/08/21  Patient Weight? 134lb  Is the patient able to get on the table? Yes  Has the patient been diagnosed with COVID? No  Is the patient waiting on COVID testing results? No  Thank you for your assistance! [facility] Team

## 2021-02-08 NOTE — Progress Notes (Signed)
   02/08/2021 Meghan Charles [id number] 01/01/1971   Chief Complaint: RLQ abdominal pain   History of Present Illness: Meghan Charles is a 50-year-old female with a history of Crohn's ileitis complicated by stricture status post small bowel resection in 2016 and in 2017 with an ileostomy which was subsequently reversed in 2018, adenomatous colon polyps, bilateral pneumonia with parapneumonic effusion in 2015 (occurred after 1 dose of Remicade), latent TB treated with INH and a subdural hematoma in August 2020. She is followed by Dr. Pyrtle. She developed N/V and RLQ pain on Friday 01/21/2021 and she generally felt unwell. Her RLQ pain worsened and she continued to having nausea and vomited bilious type emesis once or twice daily. No hematemesis or coffee ground emesis. She presented to [facility] on Monday 01/24/2021 for further evaluation.  Labs in the ED showed a Hg level of 12.2. Leukocytosis with a WBC of 12.2.  ESR 34.  CRP 72.8. BUN 12. Cr. 0.72. COVID-19 and influenza tests were negative.  CTAP with contrast identified diverticulitis to the sigmoid colon, a normal small bowel without evidence of an obstruction and a normal appendix. She was prescribed Augmentin 875 mg 1 p.o. twice daily for 10 days, Dicyclomine 20 mg 1 p.o. 4 times daily as needed and Ondansetron 4 mg as needed and she was discharged home.  She was seen by her primary care physician Dr. Lee at [facility] on Tuesday 01/25/2021.  At that time, she continued to have right lower quadrant abdominal pain and Dr. Lee discontinued Augmentin and he prescribed Metronidazole 500 mg 1 p.o. twice daily for 21 days and Phenergan as needed.  She presents to our office for further GI follow-up.  She continues to have nausea without further vomiting.  She stated her right lower quadrant abdominal pain has not improved since taking Metronidazole for 21 days.  She stated her right lower quadrant abdominal pain has worsened  over the past few days.  She stated her current RLQ pain is similar to the abdominal pain she expericenced in the past with Crohn's flare. No fever.  She passes 3 mostly loose stools daily.  She ntermittently passes a soft or formed stool.  No watery diarrhea.  No bloody bowel movements. No melena.  No NSAID use.  Her most recent colonoscopy was 10/21/2018 which identified 2 tubular adenomatous polyps which were removed from the ascending colon and diverticulosis to the sigmoid colon.  No evidence of colitis.  She was advised to repeat a colonoscopy in 5 years.  She underwent a small bowel capsule endoscopy 12/23/2018 which showed a normal small bowel.  She underwent a small bowel enterography 10/2019 which showed a normal small bowel with stable thickening to the sigmoid colon.  CTAP with contrast 01/24/2021: . Liver: Subcentimeter low-attenuation lesion in segment 6, too small for characterization. Focal fatty infiltration near the falciform ligament. . Gallbladder/biliary: Within normal limits. . Spleen: Within normal limits. . Pancreas: Within normal limits. . Adrenals: Unchanged 2.8 cm right adrenal nodule, stable since 2016. . GI tract: Small hiatal hernia. Small and large bowel loops are normal in caliber. No obstruction. Appendix is normal in caliber. There is stranding about an inflamed diverticulum in the sigmoid colon in the left lower pelvis, series 3 image 110 and 111. . Peritoneum/ Mesentery/ Extraperitoneum: Stranding around the sigmoid colon as described above. No free fluid. No organized collections no enlarged lymph nodes. . Kidneys: Symmetric enhancement. No stones or hydronephrosis. . Ureters: Within normal limits. .   Bladder: Within normal limits. . Reproductive system: Within normal limits. . Vascular: Within normal limits. CONCLUSION: Uncomplicated sigmoid diverticulitis, without evidence of complication, perforation or organized collections.  MR enterography w/wo contrast  10/22/2019: 1. Stable thickening of the sigmoid colon in the setting of diverticular disease. 2. No signs of acute small bowel process. 3. Stable right adrenal lesion. Previously characterized as adrenal adenoma by report unchanged since 2015. Exam mildly limited by motion particularly on coronal images.  Colonoscopy 10/21/2018: - The examined portion of the ileum was normal. - Two 3 to 6 mm polyps in the ascending colon, removed with a cold snare. Resected and retrieved. - Diverticulosis in the sigmoid colon. - The examination was otherwise normal on direct and retroflexion views. -Recall colonoscopy 5 years  Biopsy results: - TUBULAR ADENOMA (2 FRAGMENTS) - MULTIPLE FRAGMENTS OF BENIGN COLONIC MUCOSA - NO HIGH GRADE DYSPLASIA OR MALIGNANCY IDENTIFIED   Current Outpatient Medications on File Prior to Visit  Medication Sig Dispense Refill  . medroxyPROGESTERone (DEPO-PROVERA) 150 MG/ML injection Inject 150 mg into the muscle every 3 (three) months.    . promethazine (PHENERGAN) 12.5 MG tablet Take 1 tablet (12.5 mg total) by mouth every 8 (eight) hours as needed for nausea or vomiting. 30 tablet 11  . acetaminophen (TYLENOL) 500 MG tablet Take 1,500 mg by mouth every 6 (six) hours as needed for moderate pain or headache. (Patient not taking: Reported on 02/08/2021)    . ondansetron (ZOFRAN) 4 MG tablet TAKE 1 TABLET BY MOUTH EVERY 8 HOURS AS NEEDED FOR NAUSEA FOR VOMITING. (Patient not taking: Reported on 02/08/2021) 30 tablet 0  . pantoprazole (PROTONIX) 40 MG tablet Take 1 tablet (40 mg total) by mouth daily. (Patient not taking: Reported on 02/08/2021) 90 tablet 3  . rifaximin (XIFAXAN) 550 MG TABS tablet Take 1 tablet (550 mg total) by mouth 3 (three) times daily. (Patient not taking: Reported on 02/08/2021) 42 tablet 0  . temazepam (RESTORIL) 30 MG capsule TAKE 1 CAPSULE BY MOUTH AT BEDTIME AS NEEDED FOR SLEEP (Patient not taking: Reported on 02/08/2021) 15 capsule 0   Current  Facility-Administered Medications on File Prior to Visit  Medication Dose Route Frequency Provider Last Rate Last Admin  . 0.9 %  sodium chloride infusion  500 mL Intravenous Once Pyrtle, Jay M, MD       Allergies  Allergen Reactions  . Morphine And Related Other (See Comments)    Headache     Current Medications, Allergies, Past Medical History, Past Surgical History, Family History and Social History were reviewed in [facility] electronic medical record.   Review of Systems:   Constitutional: Negative for fever, sweats, chills or weight loss.  Respiratory: Negative for shortness of breath.   Cardiovascular: Negative for chest pain, palpitations and leg swelling.  Gastrointestinal: See HPI.  Musculoskeletal: Negative for back pain or muscle aches.  Neurological: Negative for dizziness, headaches or paresthesias.   Physical Exam: BP 122/88 (BP Location: Left Arm, Patient Position: Sitting, Cuff Size: Normal)   Pulse (!) 115   Ht 5' 8" (1.727 m)   Wt 136 lb (61.7 kg)   BMI 20.68 kg/m  General: Well developed 49 year old female in no acute distress. Head: Normocephalic and atraumatic. Eyes: No scleral icterus. Conjunctiva pink . Ears: Normal auditory acuity. Mouth: Dentition intact. No ulcers or lesions.  Lungs: Clear throughout to auscultation. Heart: Regular rate and rhythm, no murmur. Abdomen: Soft, nondistended. Moderate RLQ pain without rebound or guarding but with facial grimacing   during exam. No masses or hepatomegaly. Normal bowel sounds x 4 quadrants.  Rectal: Deferred.  Musculoskeletal: Symmetrical with no gross deformities. Extremities: No edema. Neurological: Alert oriented x 4. No focal deficits.  Psychological: Alert and cooperative. Normal mood and affect  Assessment and Recommendations:  63. 50 year old female with a history of small bowel Crohn's disease s/p small bowel resection x 2  with an ileostomy which was subsequently reversed presented to the ED  01/24/2021 with N/V and RLQ pain. CTAP identified sigmoid diverticulitis, normal small bowel and appendix. She was prescribed Augmentin 833m po bid x 10 days. She was seen by her PCP 2/8 and Augmentin was discontinued and she was prescribed Metronidazole 5061mpo bid x 21 days. Her bowel pattern varies, more loose stools than solid. No watery diarrhea. She presents today with worsening RLQ pain. She is tachycardic.  -CBC, CMP, CRP and GI pathogen panel to include C. Diff PCR -Stat CTAP with oral and IV contrast  -Augmentin 87578mne po bid x 10 days -Florastor one tab po bid  -Dicyclomine 20 mg 1 p.o. twice daily as needed abdominal pain -The above plan was discussed with Dr. PyrHilarie Fredricksonatient was instructed to go to the ED if she develops severe abdominal pain or fever -Further recommendations to be determined after the above evaluation completed -She may require a diagnostic colonoscopy 6 to 8 weeks post diverticulitis, to discuss further with Dr. PyrHilarie Fredrickson the time of her follow up appointment in 4 to 6 weeks  2.  Nausea -Phenergan as needed as previously prescribed by her PCP -Push fluids, bland diet  3.  History of tubular adenomatous colon polyp -Next colonoscopy to survey for colon polyps due 10/2023

## 2021-02-08 NOTE — Patient Instructions (Addendum)
If you are age 51 or younger, your body mass index should be between 19-25. Your Body mass index is 20.68 kg/m. If this is out of the aformentioned range listed, please consider follow up with your Primary Care Provider.   LABS:  Lab work has been ordered for you today. Our lab is located in the basement. Press "B" on the elevator. The lab is located at the first door on the left as you exit the elevator.  HEALTHCARE LAWS AND MY CHART RESULTS: Due to recent changes in healthcare laws, you may see the results of your imaging and laboratory studies on MyChart before your provider has had a chance to review them.   We understand that in some cases there may be results that are confusing or concerning to you. Not all laboratory results come back in the same time frame and the provider may be waiting for multiple results in order to interpret others.  Please give [place] 48 hours in order for your provider to thoroughly review all the results before contacting the office for clarification of your results.   IMAGING:  . You will be contacted by [facility] (Your caller ID will indicate phone # [phone]) in the next 2 days to schedule your CT Scan. If you have not heard from them within 2 business days, please call [facility] at [phone] to follow up on the status of your appointment.     Please take Dicyclomine 20 MG twice a day for abdominal pain. We have sent in a refill for you. Please increase fluid intake. Please go to the emergency room if you develop severe abdominal pain.  It was great seeing you today! Thank you for entrusting me with your care and choosing [facility].  Noralyn Pick, CRNP

## 2021-02-09 ENCOUNTER — Telehealth: Payer: Self-pay | Admitting: Nurse Practitioner

## 2021-02-09 MED ORDER — TEMAZEPAM 30 MG PO CAPS: ORAL_CAPSULE | ORAL | 0 refills | Status: DC

## 2021-02-09 NOTE — Telephone Encounter (Addendum)
It WAS sent to that pharmacy and they confirmed they recieved it.  As for the Temazepam, Colleen please advise.  dicyclomine (BENTYL) 20 MG tablet 30 tablet 0 02/08/2021    Sig - Route: Take 1 tablet (20 mg total) by mouth 2 (two) times daily as needed for spasms. As needed for abdominal pain - Oral   Sent to pharmacy as: dicyclomine (BENTYL) 20 MG tablet   E-Prescribing Status: Receipt confirmed by pharmacy (02/08/2021 3:47 PM EST)     Pharmacy  [facility], [place]

## 2021-02-09 NOTE — Telephone Encounter (Signed)
RX printed and faxed.

## 2021-02-09 NOTE — Telephone Encounter (Signed)
Pt is requesting rf for Phenergan and temazepan sent to [facility] in [place]. She also stated that she will be dropping the specimen tomorrow.

## 2021-02-09 NOTE — Telephone Encounter (Signed)
Melissa, Dr. Hilarie Fredrickson previously prescribed Temazepam so it is ok to refill Temazepam  same as prior RX for # 30 tabs with no additional refills. THX

## 2021-02-10 ENCOUNTER — Other Ambulatory Visit: Payer: 59

## 2021-02-10 DIAGNOSIS — K50019 Crohn's disease of small intestine with unspecified complications: Secondary | ICD-10-CM

## 2021-02-10 DIAGNOSIS — R1031 Right lower quadrant pain: Secondary | ICD-10-CM

## 2021-02-10 DIAGNOSIS — K5732 Diverticulitis of large intestine without perforation or abscess without bleeding: Secondary | ICD-10-CM

## 2021-02-11 MED ORDER — PROMETHAZINE HCL 12.5 MG PO TABS: 12.5000 mg | ORAL_TABLET | Freq: Three times a day (TID) | ORAL | 1 refills | Status: DC | PRN

## 2021-02-11 NOTE — Telephone Encounter (Signed)
Patient called to request the Phenergan medication originally requested. She said that is the one she needs the most please send today.

## 2021-02-11 NOTE — Progress Notes (Signed)
Addendum: Reviewed and agree with assessment and management plan. Pyrtle, Jay M, MD  

## 2021-02-11 NOTE — Addendum Note (Signed)
Addended by: Cardell Peach I on: 02/11/2021 02:50 PM   Modules accepted: Orders

## 2021-02-11 NOTE — Telephone Encounter (Signed)
RX sent

## 2021-02-13 LAB — GI PROFILE, STOOL, PCR

## 2021-02-22 ENCOUNTER — Ambulatory Visit (HOSPITAL_BASED_OUTPATIENT_CLINIC_OR_DEPARTMENT_OTHER): Payer: 59

## 2021-02-23 ENCOUNTER — Other Ambulatory Visit (HOSPITAL_BASED_OUTPATIENT_CLINIC_OR_DEPARTMENT_OTHER): Payer: Self-pay

## 2021-02-23 ENCOUNTER — Telehealth: Payer: Self-pay | Admitting: Nurse Practitioner

## 2021-02-25 NOTE — Telephone Encounter (Signed)
Patient calling back to check on medication.  Please advise.

## 2021-02-27 NOTE — Telephone Encounter (Signed)
Kelly, pls call the patient. She can increase Dicyclomine 81m to tid. She was prescribed Augmentin x 10 days at the time of her office visit. No further antibiotics to be prescribed. An abd/pelvic CT was advised ASAP at the time of her office appointment (she preferred to schedule at a later time). Her CTAP is scheduled 3/15. Further recommendation to be determined after CTAP results received. GI pathogen panel was negative. She can take Ibgard 1 po bid otc for her abdominal pain as well. If she develops severe abd pain she should go to the ED. THX

## 2021-02-28 NOTE — Telephone Encounter (Signed)
I left a detailed message for the patient with Colleen's recommendations.  She is scheduled for CT tomorrow and we will call with results and recommendations once reviewed by [facility]

## 2021-03-01 ENCOUNTER — Encounter (HOSPITAL_BASED_OUTPATIENT_CLINIC_OR_DEPARTMENT_OTHER): Payer: Self-pay

## 2021-03-01 ENCOUNTER — Ambulatory Visit (HOSPITAL_BASED_OUTPATIENT_CLINIC_OR_DEPARTMENT_OTHER)
Admission: RE | Admit: 2021-03-01 | Discharge: 2021-03-01 | Disposition: A | Discharge status: Home / Self Care | Payer: 59 | Source: Ambulatory Visit | Attending: Nurse Practitioner | Admitting: Nurse Practitioner

## 2021-03-01 DIAGNOSIS — K5792 Diverticulitis of intestine, part unspecified, without perforation or abscess without bleeding: Secondary | ICD-10-CM | POA: Insufficient documentation

## 2021-03-01 DIAGNOSIS — K50019 Crohn's disease of small intestine with unspecified complications: Secondary | ICD-10-CM | POA: Diagnosis present

## 2021-03-01 DIAGNOSIS — E278 Other specified disorders of adrenal gland: Secondary | ICD-10-CM | POA: Insufficient documentation

## 2021-03-01 MED ORDER — IOHEXOL 300 MG/ML  SOLN
100.0000 mL | Freq: Once | INTRAMUSCULAR | Status: AC | PRN
  Administered 2021-03-01: 100 mL via INTRAVENOUS
  Filled 2021-03-01: qty 100

## 2021-03-01 MED ORDER — DICYCLOMINE HCL 20 MG PO TABS: 20.0000 mg | ORAL_TABLET | Freq: Three times a day (TID) | ORAL | 3 refills | Status: DC

## 2021-03-01 NOTE — Addendum Note (Signed)
Addended by: Marlon Pel on: 03/01/2021 02:24 PM   Modules accepted: Orders

## 2021-03-03 ENCOUNTER — Telehealth: Payer: Self-pay | Admitting: Nurse Practitioner

## 2021-03-03 NOTE — Telephone Encounter (Signed)
Meghan Charles, patient calling for CT results.

## 2021-03-03 NOTE — Telephone Encounter (Signed)
Pt is requesting a call back from a nurse regarding her CT scan results.

## 2021-03-04 NOTE — Telephone Encounter (Signed)
Meghan Pick, NP  [place], Real Cons, LPN Beth, please see Dr. Vena Rua recommendations below. Please contact the patient and schedule her for an abdominal MRI with and without contrast to further evaluate the right adrenal nodule.   I called the patient so she was not confused by the 2 my chart messages. She understands an abdominal/MRI with and without contrast is necessary to follow-up on a right adrenal lesion which has increased in size when compared to prior imaging in 2013. She was agreeable with this plan.

## 2021-03-04 NOTE — Telephone Encounter (Signed)
Beth, See my chart msg sent to patient regarding CT result. Pls contact patient to schedule her for a follow up appointment with Dr. Hilarie Fredrickson. Thx

## 2021-03-28 ENCOUNTER — Other Ambulatory Visit: Payer: Self-pay

## 2021-03-28 ENCOUNTER — Telehealth: Payer: Self-pay | Admitting: Nurse Practitioner

## 2021-03-28 DIAGNOSIS — E279 Disorder of adrenal gland, unspecified: Secondary | ICD-10-CM

## 2021-03-28 NOTE — Telephone Encounter (Signed)
The order for MRI has been entered and sent to the schedulers to be completed sooner rather than later.  The pt was also given the phone number to call the schedulers to inquire on appt as well.  Appt made to see Dr Hilarie Fredrickson on 5/3 at 210 pm.

## 2021-03-28 NOTE — Telephone Encounter (Signed)
Inbound call from patient requesting a call from a nurse please.  Has more questions about the procedure she is having done on 04/05/21.  Please advise.

## 2021-03-28 NOTE — Telephone Encounter (Signed)
Inbound call from patient to see when her MRI is scheduled. Would like a return call please at [phone]

## 2021-03-28 NOTE — Telephone Encounter (Signed)
The pt had a few questions regarding the MRI and what the next steps would be. I advised that she has a follow up with Dr Hilarie Fredrickson on 5/3 to discuss colon and any further recommendations.  The pt has been advised of the information and verbalized understanding.

## 2021-04-05 ENCOUNTER — Ambulatory Visit (HOSPITAL_COMMUNITY)
Admission: RE | Admit: 2021-04-05 | Discharge: 2021-04-05 | Disposition: A | Discharge status: Home / Self Care | Payer: 59 | Source: Ambulatory Visit | Attending: Nurse Practitioner | Admitting: Nurse Practitioner

## 2021-04-05 ENCOUNTER — Other Ambulatory Visit: Payer: Self-pay

## 2021-04-05 DIAGNOSIS — E279 Disorder of adrenal gland, unspecified: Secondary | ICD-10-CM | POA: Diagnosis present

## 2021-04-05 MED ORDER — GADOBUTROL 1 MMOL/ML IV SOLN
6.0000 mL | Freq: Once | INTRAVENOUS | Status: AC | PRN
  Administered 2021-04-05: 6 mL via INTRAVENOUS

## 2021-04-12 ENCOUNTER — Other Ambulatory Visit: Payer: Self-pay

## 2021-04-12 DIAGNOSIS — D3501 Benign neoplasm of right adrenal gland: Secondary | ICD-10-CM

## 2021-04-15 ENCOUNTER — Encounter: Payer: Self-pay | Admitting: *Deleted

## 2021-04-19 ENCOUNTER — Ambulatory Visit: Payer: 59 | Admitting: Internal Medicine

## 2021-04-19 ENCOUNTER — Encounter: Payer: Self-pay | Admitting: Internal Medicine

## 2021-04-19 VITALS — BP 130/88 | HR 94 | Ht 68.0 in | Wt 147.1 lb

## 2021-04-19 DIAGNOSIS — K50019 Crohn's disease of small intestine with unspecified complications: Secondary | ICD-10-CM

## 2021-04-19 DIAGNOSIS — Z8719 Personal history of other diseases of the digestive system: Secondary | ICD-10-CM

## 2021-04-19 DIAGNOSIS — G4700 Insomnia, unspecified: Secondary | ICD-10-CM | POA: Diagnosis not present

## 2021-04-19 DIAGNOSIS — D3501 Benign neoplasm of right adrenal gland: Secondary | ICD-10-CM

## 2021-04-19 DIAGNOSIS — R110 Nausea: Secondary | ICD-10-CM

## 2021-04-19 MED ORDER — PROMETHAZINE HCL 12.5 MG PO TABS: 12.5000 mg | ORAL_TABLET | Freq: Three times a day (TID) | ORAL | 1 refills | Status: DC | PRN

## 2021-04-19 MED ORDER — GLYCOPYRROLATE 2 MG PO TABS
2.0000 mg | ORAL_TABLET | Freq: Two times a day (BID) | ORAL | 2 refills | Status: DC | PRN
Start: 2021-04-19 — End: 2021-09-27

## 2021-04-19 MED ORDER — SUTAB 1479-225-188 MG PO TABS: ORAL_TABLET | ORAL | 0 refills | Status: DC

## 2021-04-19 MED ORDER — TEMAZEPAM 30 MG PO CAPS: ORAL_CAPSULE | ORAL | 1 refills | Status: AC

## 2021-04-19 NOTE — Patient Instructions (Signed)
Continue phenergan.  Continue temazepam as needed.  DISCONTINUE dicyclomine.  We have sent the following medications to your pharmacy for you to pick up at your convenience: Robinul Forte-1 tablet by mouth every 12 hours as needed  You have been scheduled for a colonoscopy. Please follow written instructions given to you at your visit today.  Please pick up your prep supplies at the pharmacy within the next 1-3 days. If you use inhalers (even only as needed), please bring them with you on the day of your procedure.  If you are age 67 or older, your body mass index should be between 23-30. Your Body mass index is 22.37 kg/m. If this is out of the aforementioned range listed, please consider follow up with your Primary Care Provider.  If you are age 54 or younger, your body mass index should be between 19-25. Your Body mass index is 22.37 kg/m. If this is out of the aformentioned range listed, please consider follow up with your Primary Care Provider.   Due to recent changes in healthcare laws, you may see the results of your imaging and laboratory studies on MyChart before your provider has had a chance to review them.  We understand that in some cases there may be results that are confusing or concerning to you. Not all laboratory results come back in the same time frame and the provider may be waiting for multiple results in order to interpret others.  Please give [place] 48 hours in order for your provider to thoroughly review all the results before contacting the office for clarification of your results.

## 2021-04-20 ENCOUNTER — Encounter: Payer: Self-pay | Admitting: Internal Medicine

## 2021-04-20 NOTE — Progress Notes (Signed)
Subjective:    Patient ID: Meghan Charles, female    DOB: 06/05/1971, 50 y.o.   MRN: [id number]  HPI Meghan Charles is a 50 year old female with a history of Crohn's ileitis complicated by stricture status post 2 surgical resections (brief ileostomy subsequently reversed), history of adenomatous colon polyps, history of diverticulosis with recent diverticulitis, pneumonia with parapneumonic effusion in 2015 after single dose of Remicade, previously treated latent TB, history of subdural hematoma in August 2020 who is here for follow-up.  She is here alone today.  She reports that she has improved after being diagnosed by CT scan on 01/24/2021 with sigmoid diverticulitis.  She was treated with 1 week of Augmentin and 3 weeks of metronidazole therapy.  She then followed up with [place] in March.  She reports that her abdominal pain has improved.  When her diverticulitis started she felt like it may have been a Crohn's flare though it was located in the left abdomen moving to the right abdomen.  The pain has resolved at this point.  She reports that she is having regular bowel movements again without blood or melena.  No ongoing abdominal pain.  No upper GI or hepatobiliary complaint.  She does have nausea on a regular basis which is best relieved by promethazine.  Zofran helps but not as much as promethazine.  She uses temazepam for sleep.  Bentyl when taken for as needed abdominal pain does not seem to help even at 60 mg dose.  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  On CT imaging a 2.8 cm right adrenal lesion was seen and we follow this up with MRI recently which showed a benign adrenal adenoma.   Review of Systems As per HPI, otherwise negative  Current Medications, Allergies, Past Medical History, Past Surgical History, Family History and Social History were reviewed in [employer] record.     Objective:   Physical Exam BP 130/88   Pulse  94   Ht 5' 8"  (1.727 m)   Wt 147 lb 2 oz (66.7 kg)   BMI 22.37 kg/m  Gen: awake, alert, NAD HEENT: anicteric, Neuro: nonfocal   CBC    Component Value Date/Time   WBC 7.7 02/08/2021 1604   RBC 4.49 02/08/2021 1604   HGB 13.6 02/08/2021 1604   HCT 40.1 02/08/2021 1604   PLT 355.0 02/08/2021 1604   MCV 89.2 02/08/2021 1604   MCH 30.9 05/23/2019 0952   MCHC 33.8 02/08/2021 1604   RDW 13.0 02/08/2021 1604   LYMPHSABS 2.4 02/08/2021 1604   MONOABS 0.4 02/08/2021 1604   EOSABS 0.1 02/08/2021 1604   BASOSABS 0.1 02/08/2021 1604   CMP     Component Value Date/Time   NA 138 02/08/2021 1604   K 4.0 02/08/2021 1604   CL 104 02/08/2021 1604   CO2 27 02/08/2021 1604   GLUCOSE 94 02/08/2021 1604   BUN 12 02/08/2021 1604   CREATININE 0.75 02/08/2021 1604   CALCIUM 9.6 02/08/2021 1604   PROT 7.6 02/08/2021 1604   ALBUMIN 4.4 02/08/2021 1604   AST 16 02/08/2021 1604   ALT 22 02/08/2021 1604   ALKPHOS 49 02/08/2021 1604   BILITOT 0.4 02/08/2021 1604   GFRNONAA >60 05/23/2019 0952   GFRAA >60 05/23/2019 8315       Assessment & Plan:  50 year old female with a history of Crohn's ileitis complicated by stricture status post 2 surgical resections (brief ileostomy subsequently reversed), history of  adenomatous colon polyps, history of diverticulosis with recent diverticulitis, pneumonia with parapneumonic effusion in 2015 after single dose of Remicade, previously treated latent TB, history of subdural hematoma in August 2020 who is here for follow-up.    1.  Crohn's ileitis --Crohn's in remission by imaging which is remarkable given her prior complications with stricturing and need for surgery.  She has remained off of biologic therapy and at this point has done well.  We will monitor for recurrence.  Given her extreme reaction with severe pneumonia requiring hospitalization after single dose of Remicade I have been hesitant to recommend anti-TNF therapy.  Certainly other options exist  should her Crohn's return. -- Discontinue dicyclomine and try Robinul Forte 2 mg every 12 hours as needed  2.  Recent diverticulitis --resolved clinically.  I recommended colonoscopy.  Rule out activity of Crohn's disease, rule out additional polyps.  We discussed the risk, benefits and alternatives and she is agreeable to proceed.  3.  Chronic nausea --promethazine 12.5 mg every 8 hours as needed for nausea, ondansetron 4 mg every 8 hours as needed for refractory nausea and vomiting  4.  Insomnia --temazepam works well for her continue 30 mg nightly as needed  5.  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  She has been referred to see Dr. Loanne Drilling with endocrinology to exclude hormonally active adrenal lesion  30 minutes total spent today including patient facing time, coordination of care, reviewing medical history/procedures/pertinent radiology studies, and documentation of the encounter.

## 2021-06-02 ENCOUNTER — Telehealth: Payer: Self-pay | Admitting: Internal Medicine

## 2021-06-02 NOTE — Telephone Encounter (Signed)
Inbound call from patient. Need medication refill for temazepam sent to [facility] in [place].

## 2021-06-02 NOTE — Telephone Encounter (Signed)
I have left a voicemail for patient indicating that she should have an additional refill of medication to last her until around 06/19/21 (I sent #30 w/1 refill 04/19/21). I asked that she contact her pharmacy for that additional refill and call me back should she have additional questions.

## 2021-06-13 ENCOUNTER — Encounter: Payer: Self-pay | Admitting: Internal Medicine

## 2021-06-16 ENCOUNTER — Ambulatory Visit: Payer: 59 | Admitting: Endocrinology

## 2021-06-21 ENCOUNTER — Telehealth: Payer: Self-pay | Admitting: Internal Medicine

## 2021-06-21 NOTE — Telephone Encounter (Signed)
Hey Dr. Hilarie Fredrickson,   Patient called in to cancel procedure 7/7 due to needing a different day and morning slot. She is rescheduled for 10/25.

## 2021-06-23 ENCOUNTER — Encounter: Payer: 59 | Admitting: Internal Medicine

## 2021-08-11 ENCOUNTER — Telehealth: Payer: Self-pay | Admitting: Internal Medicine

## 2021-08-11 NOTE — Telephone Encounter (Signed)
Inbound call from patient wanting to make [place] aware of her admission to the ED recently and not sure if she will need a follow-up appt.  Please advise.

## 2021-08-11 NOTE — Telephone Encounter (Signed)
ER visit reviewed Patient well-known to me with history of ileal Crohn's with prior prior surgery CT scan done in the ER at outside facility showed haziness in the right colon possibly consistent with an infectious or inflammatory etiology She was given Cipro x5 days and metronidazole x7 days I agree with a course of antibiotics though traditionally her diverticulosis was only left-sided.  Right-sided diverticulosis is certainly possible.  There was no convincing evidence for diverticulitis.  Infectious etiology is also possible. When I last saw her in May I recommended colonoscopy which I believe was scheduled but canceled by the patient  Certainly okay for her to be seen but my recommendation once she improves from this acute exacerbation would be that she schedule colonoscopy as recommended previously

## 2021-08-11 NOTE — Telephone Encounter (Signed)
Pt recently seen at [facility] ER, she was given flagyl and cipro. Pt calling wanting to know if she need a f/u appt with Dr. Norman Herrlich. Do you want her to schedule? Last seen in May.

## 2021-08-11 NOTE — Telephone Encounter (Signed)
Pt was made aware of Dr. Hilarie Fredrickson recommendations. Pt agreed.

## 2021-08-26 ENCOUNTER — Telehealth: Payer: Self-pay | Admitting: Internal Medicine

## 2021-08-26 MED ORDER — PROMETHAZINE HCL 12.5 MG PO TABS: 12.5000 mg | ORAL_TABLET | Freq: Three times a day (TID) | ORAL | 0 refills | Status: DC | PRN

## 2021-08-26 NOTE — Telephone Encounter (Signed)
Patient requesting Phenergan refill and also the Tramadol that was given to her the ED for pain if possible.

## 2021-08-26 NOTE — Telephone Encounter (Signed)
Rx sent 

## 2021-09-05 ENCOUNTER — Ambulatory Visit: Payer: 59 | Admitting: Endocrinology

## 2021-09-27 ENCOUNTER — Encounter: Payer: Self-pay | Admitting: Internal Medicine

## 2021-09-27 ENCOUNTER — Other Ambulatory Visit: Payer: Self-pay

## 2021-09-27 ENCOUNTER — Ambulatory Visit (AMBULATORY_SURGERY_CENTER): Payer: 59 | Admitting: *Deleted

## 2021-09-27 VITALS — Ht 68.0 in | Wt 142.0 lb

## 2021-09-27 DIAGNOSIS — K50019 Crohn's disease of small intestine with unspecified complications: Secondary | ICD-10-CM

## 2021-09-27 NOTE — Progress Notes (Signed)
No egg or soy allergy known to patient  No issues known to pt with past sedation with any surgeries or procedures Patient denies ever being told they had issues or difficulty with intubation  No FH of Malignant Hyperthermia Pt is not on diet pills Pt is not on  home 02  Pt is not on blood thinners  Pt denies issues with constipation  No A fib or A flutter  Pt is fully vaccinated  for Covid   Sutab  at home from Prev. OV   Due to the COVID-19 pandemic we are asking patients to follow certain guidelines in PV and the [facility]   Pt aware of COVID protocols and [facility] guidelines   Pt verified name, DOB, address and insurance during PV today.  Pt mailed instruction packet of Emmi video, copy of consent form to read and not return, and instructions. Sutab  coupon mailed in packet. PV completed over the phone.  Pt encouraged to call with questions or issues.  My Chart instructions to pt as well

## 2021-10-10 ENCOUNTER — Ambulatory Visit: Payer: 59 | Admitting: Endocrinology

## 2021-10-11 ENCOUNTER — Ambulatory Visit (AMBULATORY_SURGERY_CENTER): Payer: 59 | Admitting: Internal Medicine

## 2021-10-11 ENCOUNTER — Other Ambulatory Visit: Payer: Self-pay

## 2021-10-11 ENCOUNTER — Encounter: Payer: Self-pay | Admitting: Internal Medicine

## 2021-10-11 VITALS — BP 142/101 | HR 99 | Temp 97.5°F | Resp 10 | Ht 68.0 in | Wt 143.0 lb

## 2021-10-11 DIAGNOSIS — Z86010 Personal history of colonic polyps: Secondary | ICD-10-CM

## 2021-10-11 DIAGNOSIS — D122 Benign neoplasm of ascending colon: Secondary | ICD-10-CM | POA: Diagnosis not present

## 2021-10-11 DIAGNOSIS — K50019 Crohn's disease of small intestine with unspecified complications: Secondary | ICD-10-CM

## 2021-10-11 MED ORDER — SODIUM CHLORIDE 0.9 % IV SOLN: 500.0000 mL | Freq: Once | INTRAVENOUS | Status: DC

## 2021-10-11 NOTE — Patient Instructions (Signed)
Discharge instructions given. Handouts on polyps,diverticulosis and Hemorrhoids. Resume previous medications. YOU HAD AN ENDOSCOPIC PROCEDURE TODAY AT [facility]:   Refer to the procedure report that was given to you for any specific questions about what was found during the examination.  If the procedure report does not answer your questions, please call your gastroenterologist to clarify.  If you requested that your care partner not be given the details of your procedure findings, then the procedure report has been included in a sealed envelope for you to review at your convenience later.  YOU SHOULD EXPECT: Some feelings of bloating in the abdomen. Passage of more gas than usual.  Walking can help get rid of the air that was put into your GI tract during the procedure and reduce the bloating. If you had a lower endoscopy (such as a colonoscopy or flexible sigmoidoscopy) you may notice spotting of blood in your stool or on the toilet paper. If you underwent a bowel prep for your procedure, you may not have a normal bowel movement for a few days.  Please Note:  You might notice some irritation and congestion in your nose or some drainage.  This is from the oxygen used during your procedure.  There is no need for concern and it should clear up in a day or so.  SYMPTOMS TO REPORT IMMEDIATELY:  Following lower endoscopy (colonoscopy or flexible sigmoidoscopy):  Excessive amounts of blood in the stool  Significant tenderness or worsening of abdominal pains  Swelling of the abdomen that is new, acute  Fever of 100F or higher   For urgent or emergent issues, a gastroenterologist can be reached at any hour by calling [phone]. Do not use MyChart messaging for urgent concerns.    DIET:  We do recommend a small meal at first, but then you may proceed to your regular diet.  Drink plenty of fluids but you should avoid alcoholic beverages for 24 hours.  ACTIVITY:  You should  plan to take it easy for the rest of today and you should NOT DRIVE or use heavy machinery until tomorrow (because of the sedation medicines used during the test).    FOLLOW UP: Our staff will call the number listed on your records 48-72 hours following your procedure to check on you and address any questions or concerns that you may have regarding the information given to you following your procedure. If we do not reach you, we will leave a message.  We will attempt to reach you two times.  During this call, we will ask if you have developed any symptoms of COVID 19. If you develop any symptoms (ie: fever, flu-like symptoms, shortness of breath, cough etc.) before then, please call [phone].  If you test positive for Covid 19 in the 2 weeks post procedure, please call and report this information to [place].    If any biopsies were taken you will be contacted by phone or by letter within the next 1-3 weeks.  Please call us at [phone] if you have not heard about the biopsies in 3 weeks.    SIGNATURES/CONFIDENTIALITY: You and/or your care partner have signed paperwork which will be entered into your electronic medical record.  These signatures attest to the fact that that the information above on your After Visit Summary has been reviewed and is understood.  Full responsibility of the confidentiality of this discharge information lies with you and/or your care-partner.

## 2021-10-11 NOTE — Op Note (Signed)
[facility] Patient Name: Meghan Charles Procedure Date: 10/11/2021 8:45 AM MRN: [id number] Endoscopist: Jerene Bears , MD Age: 50 Referring MD:  Date of Birth: 1971-01-02 Gender: Female Account #: [id number] Procedure:                Colonoscopy Indications:              Surveillance: Personal history of adenomatous                            polyps on last colonoscopy 3 years ago, history of                            ileal Crohn's disease; last colonoscopy Nov 2019;                            Aug 2022 ED visit with CT abd/pelvis suggesting                            right colonic inflammation versus infectious                            process (clinically symptom free today) Medicines:                Monitored Anesthesia Care Procedure:                Pre-Anesthesia Assessment:                           - Prior to the procedure, a History and Physical                            was performed, and patient medications and                            allergies were reviewed. The patient's tolerance of                            previous anesthesia was also reviewed. The risks                            and benefits of the procedure and the sedation                            options and risks were discussed with the patient.                            All questions were answered, and informed consent                            was obtained. Prior Anticoagulants: The patient has                            taken no previous anticoagulant or antiplatelet  agents. ASA Grade Assessment: II - A patient with                            mild systemic disease. After reviewing the risks                            and benefits, the patient was deemed in                            satisfactory condition to undergo the procedure.                           After obtaining informed consent, the colonoscope                            was passed under direct  vision. Throughout the                            procedure, the patient's blood pressure, pulse, and                            oxygen saturations were monitored continuously. The                            PCF-HQ190L Colonoscope was introduced through the                            anus and advanced to the terminal ileum. The                            colonoscopy was performed without difficulty. The                            patient tolerated the procedure well. The quality                            of the bowel preparation was excellent. The                            terminal ileum, ileocecal valve, appendiceal                            orifice, and rectum were photographed. Scope In: 8:52:35 AM Scope Out: 9:11:48 AM Scope Withdrawal Time: 0 hours 16 minutes 37 seconds  Total Procedure Duration: 0 hours 19 minutes 13 seconds  Findings:                 The digital rectal exam was normal.                           The terminal ileum appeared normal. Biopsies were                            taken with a cold forceps for histology. No  evidence for active Crohn's in the examined TI.                           A 5 mm polyp was found in the ascending colon. The                            polyp was sessile. The polyp was removed with a                            cold snare. Resection and retrieval were complete.                           Normal mucosa was found in the entire colon.                            Multiple biopsies were obtained in the ascending                            colon with cold forceps for histology.                           Multiple small-mouthed diverticula were found in                            the sigmoid colon.                           Internal hemorrhoids were found during                            retroflexion. The hemorrhoids were small. Complications:            No immediate complications. Estimated Blood Loss:     Estimated  blood loss was minimal. Impression:               - The examined portion of the ileum was normal.                            Biopsied.                           - One 5 mm polyp in the ascending colon, removed                            with a cold snare. Resected and retrieved.                           - Normal mucosa in the entire examined colon.                            Multiple biopsies were obtained in the ascending                            colon.                           -  Diverticulosis in the sigmoid colon.                           - Small internal hemorrhoids.                           - No evidence of active Crohn's in the examined TI                            or entire colon. Recommendation:           - Patient has a contact number available for                            emergencies. The signs and symptoms of potential                            delayed complications were discussed with the                            patient. Return to normal activities tomorrow.                            Written discharge instructions were provided to the                            patient.                           - Resume previous diet.                           - Continue present medications.                           - Await pathology results.                           - Repeat colonoscopy is recommended for                            surveillance. The colonoscopy date will be                            determined after pathology results from today's                            exam become available for review. Jerene Bears, MD 10/11/2021 9:19:21 AM This report has been signed electronically.

## 2021-10-11 NOTE — Progress Notes (Signed)
Called to room to assist during endoscopic procedure.  Patient ID and intended procedure confirmed with present staff. Received instructions for my participation in the procedure from the performing physician.  

## 2021-10-11 NOTE — Progress Notes (Signed)
Report given to PACU, vss 

## 2021-10-11 NOTE — Progress Notes (Signed)
Pt's states no medical or surgical changes since previsit or office visit. 

## 2021-10-11 NOTE — Progress Notes (Signed)
GASTROENTEROLOGY PROCEDURE H&P NOTE   Primary Care Physician: Valinda Hoar, PA-C (Inactive)    Reason for Procedure:  History of adenomatous colon polyps and ileal Crohn's disease  Plan:    Ileocolonoscopy  Patient is appropriate for endoscopic procedure(s) in the ambulatory ([facility]) setting.  The nature of the procedure, as well as the risks, benefits, and alternatives were carefully and thoroughly reviewed with the patient. Ample time for discussion and questions allowed. The patient understood, was satisfied, and agreed to proceed.     HPI: Meghan Charles is a 50 y.o. female who presents for colonoscopy given history of adenomatous colon polyps but also ileal Crohn's disease.  No specific complaints today including chest pain, shortness of breath.  Tolerated the prep.  Past Medical History:  Diagnosis Date   Adrenal adenoma, right    Allergy    seasonal   Anxiety    Colon obstruction (HCC)    Colon polyps 05/2014   TUBULAR ADENOMA AND HYPERPLASTIC POLYP.   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 Authorizing Provider  acetaminophen (TYLENOL) 500 MG tablet Take 1,500 mg by mouth every 6 (six) hours as needed for moderate pain  or headache.   Yes [provider]  ALPRAZolam (XANAX) 0.25 MG tablet Take 0.25 mg by mouth daily as needed. 07/21/21  Yes [provider]  medroxyPROGESTERone (DEPO-PROVERA) 150 MG/ML injection Inject 150 mg into the muscle every 3 (three) months.   Yes [provider]  promethazine (PHENERGAN) 12.5 MG tablet Take 1 tablet (12.5 mg total) by mouth every 8 (eight) hours as needed for nausea or vomiting. 08/26/21   Demetric Dunnaway, Lajuan Lines, MD  temazepam (RESTORIL) 30 MG capsule TAKE 1 CAPSULE BY MOUTH AT BEDTIME AS NEEDED FOR SLEEP Patient not taking: Reported on 10/11/2021 04/19/21   Jerene Bears, MD    Current Outpatient Medications  Medication Sig Dispense Refill   acetaminophen (TYLENOL) 500 MG tablet Take 1,500 mg by mouth every 6 (six) hours as needed for moderate pain or headache.     ALPRAZolam (XANAX) 0.25 MG tablet Take 0.25 mg by mouth daily as needed.     medroxyPROGESTERone (DEPO-PROVERA) 150 MG/ML injection Inject 150 mg into the muscle every 3 (three) months.     promethazine (PHENERGAN) 12.5 MG tablet Take 1 tablet (12.5 mg total) by mouth every 8 (eight) hours as needed for nausea or vomiting. 30 tablet 0   temazepam (RESTORIL) 30 MG capsule TAKE 1 CAPSULE BY MOUTH AT BEDTIME AS NEEDED FOR SLEEP (Patient not taking: Reported on 10/11/2021) 30 capsule 1   Current Facility-Administered Medications  Medication Dose Route Frequency Provider Last  Rate Last Admin   0.9 %  sodium chloride infusion  500 mL Intravenous Once Torie Towle, Lajuan Lines, MD        Allergies as of 10/11/2021 - Review Complete 10/11/2021  Allergen Reaction Noted   Morphine and related Other (See Comments) 05/11/2015    Family History  Problem Relation Age of Onset   Breast cancer Mother 61   Colon cancer Maternal Grandmother 37   Esophageal cancer Neg Hx    Stomach cancer Neg Hx    Rectal cancer Neg Hx    Pancreatic cancer Neg Hx    Colon polyps Neg Hx     Social History   Socioeconomic  History   Marital status: Single    Spouse name: Not on file   Number of children: Not on file   Years of education: Not on file   Highest education level: Not on file  Occupational History   Occupation: Contractor: [employer]  Tobacco Use   Smoking status: Never   Smokeless tobacco: Never  Vaping Use   Vaping Use: Never used  Substance and Sexual Activity   Alcohol use: Yes    Alcohol/week: 0.0 standard drinks    Comment: 3 times per week   Drug use: No   Sexual activity: Never    Birth control/protection: Injection  Other Topics Concern   Not on file  Social History Narrative   Not on file   Social Determinants of Health   Financial Resource Strain: Not on file  Food Insecurity: Not on file  Transportation Needs: Not on file  Physical Activity: Not on file  Stress: Not on file  Social Connections: Not on file  Intimate Partner Violence: Not on file    Physical Exam: Vital signs in last 24 hours: @BP  (!) 134/95   Pulse 84   Temp (!) 97.5 F (36.4 C) (Temporal)   Resp 12   Ht 5' 8"  (1.727 m)   Wt 143 lb (64.9 kg)   SpO2 100%   BMI 21.74 kg/m  GEN: NAD EYE: Sclerae anicteric ENT: MMM CV: Non-tachycardic Pulm: CTA b/l GI: Soft, NT/ND NEURO:  Alert & Oriented x 3   Zenovia Jarred, MD [facility]  10/11/2021 8:41 AM

## 2021-10-13 ENCOUNTER — Telehealth: Payer: Self-pay | Admitting: *Deleted

## 2021-10-13 NOTE — Telephone Encounter (Signed)
  Follow up Call-  Call back number 10/11/2021  Post procedure Call Back phone  # [phone]  Permission to leave phone message Yes  Some recent data might be hidden     Patient questions:  Do you have a fever, pain , or abdominal swelling? No. Pain Score  0 *  Have you tolerated food without any problems? Yes.    Have you been able to return to your normal activities? Yes.    Do you have any questions about your discharge instructions: Diet   No. Medications  No. Follow up visit  No.  Do you have questions or concerns about your Care? No.  Actions: * If pain score is 4 or above: No action needed, pain <4.  Patient stated she was advised by you to take the medications as needed, and she was unclear what this meant, and I didn't see any meds as well.  She thought one of them may have been doxycycline?

## 2021-10-13 NOTE — Telephone Encounter (Signed)
Dicyclomine 20 mg 3 times daily as needed crampy abdominal pain or loose stools

## 2021-10-13 NOTE — Telephone Encounter (Signed)
Follow up Call-   Call back number 10/11/2021  Post procedure Call Back phone  # [phone]  Permission to leave phone message Yes  Some recent data might be hidden      Patient questions:   Do you have a fever, pain , or abdominal swelling? No. Pain Score  0 *   Have you tolerated food without any problems? Yes.     Have you been able to return to your normal activities? Yes.     Do you have any questions about your discharge instructions: Diet                              No. Medications                 No. Follow up visit             No.   Do you have questions or concerns about your Care? No.   Actions: * If pain score is 4 or above: No action needed, pain <4.   Patient stated she was advised by you to take the medications as needed, and she was unclear what this meant, and I didn't see any meds as well.  She thought one of them may have been doxycycline?

## 2021-10-18 ENCOUNTER — Encounter: Payer: Self-pay | Admitting: Internal Medicine

## 2021-10-18 MED ORDER — DICYCLOMINE HCL 10 MG PO CAPS: 20.0000 mg | ORAL_CAPSULE | Freq: Three times a day (TID) | ORAL | 0 refills | Status: DC

## 2021-10-27 ENCOUNTER — Telehealth: Payer: Self-pay | Admitting: Internal Medicine

## 2021-10-27 MED ORDER — METRONIDAZOLE 250 MG PO TABS: 250.0000 mg | ORAL_TABLET | Freq: Three times a day (TID) | ORAL | 0 refills | Status: DC

## 2021-10-27 MED ORDER — GLYCOPYRROLATE 2 MG PO TABS: 2.0000 mg | ORAL_TABLET | Freq: Two times a day (BID) | ORAL | 1 refills | Status: DC

## 2021-10-27 MED ORDER — CIPROFLOXACIN HCL 500 MG PO TABS: 500.0000 mg | ORAL_TABLET | Freq: Two times a day (BID) | ORAL | 0 refills | Status: DC

## 2021-10-27 NOTE — Telephone Encounter (Signed)
Pt reports she recently had a colon with Dr. Hilarie Fredrickson. States she has been having pain in her LLQ. Her PCP gave her tramadol #10 and dicyclomine but pt reports this is not working any more. States her Left side hurts especially after she eats, feels like it is swelling. She wants to know if something else can be sent in for the pain she is having. Please advise.

## 2021-10-27 NOTE — Telephone Encounter (Signed)
Inbound call from patient. States she is experiencing abd pain on left side. States the tramadol and dicyclomine is not helping and would like to discuss a stronger medication to help.

## 2021-10-27 NOTE — Telephone Encounter (Signed)
Please determine if the pain worsened or started since the colonoscopy She has Crohn's disease but no activity on recent colonoscopy She does have diverticulosis and so diverticulitis is possible; this would be treated with antibiotics  I would like to avoid narcotics because this does not help with bowel pain.  She also has a history of alcohol dependency and we want to be careful with any medications which can have dependent properties Given that Bentyl is not working we could try Robinul Forte 2 mg every 12 hours as needed If pain is worsened since colonoscopy I would treat with Cipro 500 mg twice daily and metronidazole 250 mg 3 times daily x7 days

## 2021-10-27 NOTE — Telephone Encounter (Signed)
Left message for pt to call back.  Spoke with pt and she states the pain started after the colonoscopy. She is aware of Dr. Quentin Mulling recommendations, scripts sent to pharmacy.

## 2022-01-27 ENCOUNTER — Telehealth: Payer: Self-pay | Admitting: Internal Medicine

## 2022-01-27 MED ORDER — PROMETHAZINE HCL 12.5 MG PO TABS: 12.5000 mg | ORAL_TABLET | Freq: Three times a day (TID) | ORAL | 0 refills | Status: DC | PRN

## 2022-01-27 NOTE — Telephone Encounter (Signed)
Dr Hilarie Fredrickson- Please advise on a couple of things...  Looks like patient was recently at the hospital for seizures. Is phenergan still appropriate for her with this recent finding?  2. Looks like patient was seen by [facility]   11/24/21. Do you consider this change of care?

## 2022-01-27 NOTE — Telephone Encounter (Signed)
[facility] for refill today, but need to ask her if she plans to maintain GI care here or with [facility]

## 2022-01-27 NOTE — Telephone Encounter (Signed)
Patient states that she only went to [facility] because she had been hospitalized for seizures and they wanted her to see their GI doctor while there. She states she told them that she sees [facility] and wanted to continue care here.   It also appears that patient did not see endocrinology as originally planned for 06/16/21 about the adrenal mass seen on MRI 04/05/21. I have given her the number to [facility] and asked that she reschedule this appointment. She verbalizes understanding.  Rx for phenergan sent to pharmacy.

## 2022-01-27 NOTE — Telephone Encounter (Signed)
Patient called requesting a refill on Phenergan

## 2023-11-23 ENCOUNTER — Ambulatory Visit: Payer: Self-pay | Admitting: Physician Assistant

## 2024-02-05 ENCOUNTER — Ambulatory Visit: Payer: Commercial Managed Care - PPO | Admitting: Nurse Practitioner

## 2024-03-13 ENCOUNTER — Other Ambulatory Visit (INDEPENDENT_AMBULATORY_CARE_PROVIDER_SITE_OTHER)

## 2024-03-13 ENCOUNTER — Encounter: Payer: Self-pay | Admitting: Physician Assistant

## 2024-03-13 ENCOUNTER — Ambulatory Visit: Payer: Commercial Managed Care - PPO | Admitting: Physician Assistant

## 2024-03-13 VITALS — BP 114/72 | HR 78 | Ht 68.0 in | Wt 132.0 lb

## 2024-03-13 DIAGNOSIS — R109 Unspecified abdominal pain: Secondary | ICD-10-CM | POA: Diagnosis not present

## 2024-03-13 DIAGNOSIS — K921 Melena: Secondary | ICD-10-CM

## 2024-03-13 DIAGNOSIS — Z87898 Personal history of other specified conditions: Secondary | ICD-10-CM

## 2024-03-13 DIAGNOSIS — R194 Change in bowel habit: Secondary | ICD-10-CM | POA: Diagnosis not present

## 2024-03-13 DIAGNOSIS — R110 Nausea: Secondary | ICD-10-CM

## 2024-03-13 DIAGNOSIS — Z8719 Personal history of other diseases of the digestive system: Secondary | ICD-10-CM

## 2024-03-13 DIAGNOSIS — K5000 Crohn's disease of small intestine without complications: Secondary | ICD-10-CM | POA: Diagnosis not present

## 2024-03-13 DIAGNOSIS — Z86018 Personal history of other benign neoplasm: Secondary | ICD-10-CM

## 2024-03-13 DIAGNOSIS — Z8669 Personal history of other diseases of the nervous system and sense organs: Secondary | ICD-10-CM

## 2024-03-13 LAB — CBC WITH DIFFERENTIAL/PLATELET
Basophils Absolute: 0 10*3/uL (ref 0.0–0.1)
Basophils Relative: 0.9 % (ref 0.0–3.0)
Eosinophils Absolute: 0 10*3/uL (ref 0.0–0.7)
Eosinophils Relative: 0.7 % (ref 0.0–5.0)
HCT: 40.9 % (ref 36.0–46.0)
Hemoglobin: 14 g/dL (ref 12.0–15.0)
Lymphocytes Relative: 41.3 % (ref 12.0–46.0)
Lymphs Abs: 1.8 10*3/uL (ref 0.7–4.0)
MCHC: 34.1 g/dL (ref 30.0–36.0)
MCV: 93.6 fl (ref 78.0–100.0)
Monocytes Absolute: 0.3 10*3/uL (ref 0.1–1.0)
Monocytes Relative: 6.3 % (ref 3.0–12.0)
Neutro Abs: 2.2 10*3/uL (ref 1.4–7.7)
Neutrophils Relative %: 50.8 % (ref 43.0–77.0)
Platelets: 152 10*3/uL (ref 150.0–400.0)
RBC: 4.38 Mil/uL (ref 3.87–5.11)
RDW: 12.9 % (ref 11.5–15.5)
WBC: 4.4 10*3/uL (ref 4.0–10.5)

## 2024-03-13 LAB — COMPREHENSIVE METABOLIC PANEL WITH GFR
ALT: 16 U/L (ref 0–35)
AST: 18 U/L (ref 0–37)
Albumin: 4.5 g/dL (ref 3.5–5.2)
Alkaline Phosphatase: 40 U/L (ref 39–117)
BUN: 16 mg/dL (ref 6–23)
CO2: 31 meq/L (ref 19–32)
Calcium: 9.1 mg/dL (ref 8.4–10.5)
Chloride: 101 meq/L (ref 96–112)
Creatinine, Ser: 0.87 mg/dL (ref 0.40–1.20)
GFR: 76.54 mL/min (ref >60.00)
Glucose, Bld: 86 mg/dL (ref 70–99)
Potassium: 3.9 meq/L (ref 3.5–5.1)
Sodium: 139 meq/L (ref 135–145)
Total Bilirubin: 0.4 mg/dL (ref 0.2–1.2)
Total Protein: 7.1 g/dL (ref 6.0–8.3)

## 2024-03-13 MED ORDER — NA SULFATE-K SULFATE-MG SULF 17.5-3.13-1.6 GM/177ML PO SOLN: 1.0000 | Freq: Once | ORAL | 0 refills | Status: AC

## 2024-03-13 MED ORDER — PROMETHAZINE HCL 12.5 MG PO TABS: 12.5000 mg | ORAL_TABLET | Freq: Two times a day (BID) | ORAL | 3 refills | Status: AC

## 2024-03-13 NOTE — Progress Notes (Signed)
 Chief Complaint: Follow-up Crohn's disease, abdominal pain and chronic nausea  HPI:    Meghan Charles is a 53 year old female with a past medical history as listed below including Crohn's ileitis complicated by stricture status post 2 surgical resections (brief ileostomy subsequently reversed), history of adenomatous polyps, history of diverticulosis without recent diverticulitis, pneumonia with parapneumonic effusion in 2015 after single dose of Remicade, previously treated for latent TB, known to Dr. Rhea Belton, who was referred to me by [facility], Baxter Kail,* for a complaint of follow-up of Crohn's disease, abdominal pain and chronic nausea.      04/05/2021 MRI of the abdomen with and without contrast showed a 2.8 cm benign right adrenal adenoma.    04/19/2021 office visit with Dr. Rhea Belton and at that time doing fairly well.  Discussed her Crohn's is in remission by imaging which was remarkable given her prior complications with stricturing and need for surgery.  She had remained a biological therapy and done well.  She had an extreme reaction and hospitalization after single dose of Remicade.  Hesitant to restart anti-TNF therapy.  Noted chronic nausea on Promethazine 12.5 mg every 8 hours as needed and Zofran 4 mg every 8 hours.    10/11/2021 colonoscopy   with a 5 mm polyp removed in the ascending colon, diverticulosis in the sigmoid colon small internal hemorrhoids, no evidence of active Crohn's in the examined TI or entire colon.  Repeat recommended in 5 years.    Today, patient presents to clinic and explains that she had a seizure in 2023 and spent 5 days in the hospital, apparently went back to work on July 10, then had another seizure in February 2024 and has had 1 more in between.  Describes that they think the seizures may have been related to a fall she had, now maintained on Keppra and Depakote.  Still follows with neurology.  Due to the seizure she cannot remember some details from her history.   In fact does not recall when she was last seen in clinic.    Explains that occasionally she feels like she is having "flares of Crohn's", where the right side of her abdomen seems to swell up when maybe she has eaten too much or in the wrong thing and she almost gets a "numbing sensation" on that side.  Tells me she drinks a Sprite 0 over ice and it will typically go away and may last a day or 2 or sometimes shorter.  She occasionally has formed stool and other times water, most recently her stools been formed occasionally sees some bright red blood in her stool.  Also continues with chronic nausea and has to use Phenergan, in fact the past 2 nights she has had to use it.  She denies any further alcohol use over the past 2 years.  In general she feels fairly well but has symptoms off-and-on wants to make sure nothing is going on.    Denies fever, chills, weight loss or symptoms that awaken her from sleep.  Past Medical History:  Diagnosis Date   Adrenal adenoma, right    Allergy    seasonal   Anxiety    Colon obstruction (HCC)    Colon polyps 05/2014   TUBULAR ADENOMA AND HYPERPLASTIC POLYP.   Crohn's disease of small intestine with complication (HCC)    Depression    Diverticulitis    Gallbladder polyp    GERD (gastroesophageal reflux disease)    Subdural hematoma (HCC) 2020   Tubular adenoma  of colon     Past Surgical History:  Procedure Laterality Date   COLON RESECTION N/A 05/11/2015   Procedure: Laparotomy with resection of Chron's Disease small bowel resection biopsy mesenteric nodule;  Surgeon: Claud Kelp, MD;  Location: [facility];  Service: General;  Laterality: N/A;   COLONOSCOPY     COLONOSCOPY W/ BIOPSIES  2015   LAPAROSCOPIC LYSIS OF ADHESIONS  02/2017   loop ileostomy  02/19/2017   POLYPECTOMY     Right Heel surgery     with plates and screws   wisdom teeth extracted      Current Outpatient Medications  Medication Sig Dispense Refill   acetaminophen (TYLENOL) 500  MG tablet Take 1,500 mg by mouth every 6 (six) hours as needed for moderate pain or headache.     promethazine (PHENERGAN) 12.5 MG tablet Take 1 tablet (12.5 mg total) by mouth every 8 (eight) hours as needed for nausea or vomiting. 30 tablet 0   ALPRAZolam (XANAX) 0.25 MG tablet Take 0.25 mg by mouth daily as needed. (Patient not taking: Reported on 03/13/2024)     ciprofloxacin (CIPRO) 500 MG tablet Take 1 tablet (500 mg total) by mouth 2 (two) times daily. (Patient not taking: Reported on 03/13/2024) 14 tablet 0   dicyclomine (BENTYL) 10 MG capsule Take 2 capsules (20 mg total) by mouth 4 (four) times daily -  before meals and at bedtime. (Patient not taking: Reported on 03/13/2024) 90 capsule 0   glycopyrrolate (ROBINUL) 2 MG tablet Take 1 tablet (2 mg total) by mouth 2 (two) times daily. (Patient not taking: Reported on 03/13/2024) 60 tablet 1   medroxyPROGESTERone (DEPO-PROVERA) 150 MG/ML injection Inject 150 mg into the muscle every 3 (three) months. (Patient not taking: Reported on 03/13/2024)     metroNIDAZOLE (FLAGYL) 250 MG tablet Take 1 tablet (250 mg total) by mouth 3 (three) times daily. (Patient not taking: Reported on 03/13/2024) 21 tablet 0   temazepam (RESTORIL) 30 MG capsule TAKE 1 CAPSULE BY MOUTH AT BEDTIME AS NEEDED FOR SLEEP (Patient not taking: Reported on 03/13/2024) 30 capsule 1   No current facility-administered medications for this visit.    Allergies as of 03/13/2024 - Review Complete 03/13/2024  Allergen Reaction Noted   Morphine and codeine Other (See Comments) 05/11/2015    Family History  Problem Relation Age of Onset   Breast cancer Mother 35   Colon cancer Maternal Grandmother 35   Esophageal cancer Neg Hx    Stomach cancer Neg Hx    Rectal cancer Neg Hx    Pancreatic cancer Neg Hx    Colon polyps Neg Hx     Social History   Socioeconomic History   Marital status: Single    Spouse name: Not on file   Number of children: Not on file   Years of education:  Not on file   Highest education level: Not on file  Occupational History   Occupation: Patent attorney: [employer]  Tobacco Use   Smoking status: Never   Smokeless tobacco: Never  Vaping Use   Vaping status: Never Used  Substance and Sexual Activity   Alcohol use: Yes    Alcohol/week: 0.0 standard drinks of alcohol    Comment: 3 times per week   Drug use: No   Sexual activity: Never    Birth control/protection: Injection  Other Topics Concern   Not on file  Social History Narrative   Not on file   Social Drivers of  Health   Financial Resource Strain: Low Risk  (01/31/2023)   [facility]   Overall Financial Resource Strain (CARDIA)    Difficulty of Paying Living Expenses: Not hard at all  Food Insecurity: No Food Insecurity (01/31/2023)   Received from [facility]   Hunger Vital Sign    Worried About Running Out of Food in the Last Year: Never true    Ran Out of Food in the Last Year: Never true  Transportation Needs: No Transportation Needs (01/31/2023)   Received from [facility] - Transportation    Lack of Transportation (Medical): No    Lack of Transportation (Non-Medical): No  Physical Activity: Not on file  Stress: No Stress Concern Present (06/18/2022)   Received from [facility] - Occupational Stress Questionnaire    Feeling of Stress : Only a little  Social Connections: Unknown (04/30/2022)   Received from [facility]   Social Network    Social Network: Not on file  Intimate Partner Violence: Unknown (03/22/2022)   Received from [facility]   HITS    Physically Hurt: Not on file    Insult or Talk Down To: Not on file    Threaten Physical Harm: Not on file    Scream or Curse: Not on file    Review of Systems:    Constitutional: No weight loss, fever or chills Skin: No rash Cardiovascular: No chest pain Respiratory: No SOB  Gastrointestinal: See HPI and otherwise  negative Genitourinary: No dysuria  Neurological: No headache, dizziness or syncope Musculoskeletal: No new muscle or joint pain Hematologic: See HPI Psychiatric: No history of depression or anxiety   Physical Exam:  Vital signs: BP 114/72   Pulse 78   Ht 5\' 8"  (1.727 m)   Wt 132 lb (59.9 kg)   BMI 20.07 kg/m    Constitutional:   Pleasant Caucasian female appears to be in NAD, Well developed, Well nourished, alert and cooperative Head:  Normocephalic and atraumatic. Eyes:   PEERL, EOMI. No icterus. Conjunctiva pink. Ears:  Normal auditory acuity. Neck:  Supple Throat: Oral cavity and pharynx without inflammation, swelling or lesion.  Respiratory: Respirations even and unlabored. Lungs clear to auscultation bilaterally.   No wheezes, crackles, or rhonchi.  Cardiovascular: Normal S1, S2. No MRG. Regular rate and rhythm. No peripheral edema, cyanosis or pallor.  Gastrointestinal:  Soft, nondistended, nontender. No rebound or guarding. Normal bowel sounds. No appreciable masses or hepatomegaly. Rectal:  Not performed.  Msk:  Symmetrical without gross deformities. Without edema, no deformity or joint abnormality.  Neurologic:  Alert and  oriented x4;  grossly normal neurologically.  Skin:   Dry and intact without significant lesions or rashes. Psychiatric: Demonstrates good judgement and reason without abnormal affect or behaviors.  RELEVANT LABS AND IMAGING: CBC    Component Value Date/Time   WBC 7.7 02/08/2021 1604   RBC 4.49 02/08/2021 1604   HGB 13.6 02/08/2021 1604   HCT 40.1 02/08/2021 1604   PLT 355.0 02/08/2021 1604   MCV 89.2 02/08/2021 1604   MCH 30.9 05/23/2019 0952   MCHC 33.8 02/08/2021 1604   RDW 13.0 02/08/2021 1604   LYMPHSABS 2.4 02/08/2021 1604   MONOABS 0.4 02/08/2021 1604   EOSABS 0.1 02/08/2021 1604   BASOSABS 0.1 02/08/2021 1604    CMP     Component Value Date/Time   NA 138 02/08/2021 1604   K 4.0 02/08/2021 1604   CL 104 02/08/2021  1604   CO2  27 02/08/2021 1604   GLUCOSE 94 02/08/2021 1604   BUN 12 02/08/2021 1604   CREATININE 0.75 02/08/2021 1604   CALCIUM 9.6 02/08/2021 1604   PROT 7.6 02/08/2021 1604   ALBUMIN 4.4 02/08/2021 1604   AST 16 02/08/2021 1604   ALT 22 02/08/2021 1604   ALKPHOS 49 02/08/2021 1604   BILITOT 0.4 02/08/2021 1604   GFRNONAA >60 05/23/2019 0952   GFRAA >60 05/23/2019 0952    Assessment: 1. Crohn's Ileitis:  in remission by imaging and based off of last Colonoscopy in 2022, she has remained off biological therapy and has done well, she had an extreme reaction of severe pneumonia requiring hospitalization after single dose of Remicade so there has been hesitation to recommend anti-TNF therapy, does report some occasional hematochezia as well as abdominal pain off and on, unsure if this is IBS or related to history of Crohn's 2.  Chronic nausea: Has been on Phenergan or Zofran for years, no prior EGD that I can tell; consider relation to Crohn's versus functional symptoms versus gastritis versus other 3.  History of adrenal adenoma: Was supposed to follow with endocrinology, will check with the patient to see if she has done so 4.  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  Scheduled patient for an EGD and colonoscopy for diagnostic purposes given chronic nausea and history of Crohn's abdominal pain hematochezia.  These are scheduled with Dr. Rhea Belton in the [facility].  Did provide the patient a detailed list of risks for the  procedures and she agrees to proceed. Patient is appropriate for endoscopic procedure(s) in the ambulatory ([facility]) setting.  2.  Refill Phenergan 12.5 mg #30 with 5 refills.  She can use these 1 tab every 8 hours as needed for nausea 3.  Labs today including CBC, CMP, iron studies and vitamin D 4.  Will send clearance to her neurology team prior to procedures given history of seizures. 5.  Patient to follow in clinic per recommendations after  time of procedures.  Hyacinth Meeker, PA-C [facility] 03/13/2024, 10:33 AM  Cc: Kirt Boys,*

## 2024-03-13 NOTE — Patient Instructions (Signed)
 Your provider has requested that you go to the basement level for lab work before leaving today. Press "B" on the elevator. The lab is located at the first door on the left as you exit the elevator.   We have sent the following medications to your pharmacy for you to pick up at your convenience: Phenergan 12.5 mg  You have been scheduled for a colonoscopy. Please follow written instructions given to you at your visit today.   If you use inhalers (even only as needed), please bring them with you on the day of your procedure.  DO NOT TAKE 7 DAYS PRIOR TO TEST- Trulicity (dulaglutide) Ozempic, Wegovy (semaglutide) Mounjaro (tirzepatide) Bydureon Bcise (exanatide extended release)  DO NOT TAKE 1 DAY PRIOR TO YOUR TEST Rybelsus (semaglutide) Adlyxin (lixisenatide) Victoza (liraglutide) Byetta (exanatide)  _______________________________________________________  If your blood pressure at your visit was 140/90 or greater, please contact your primary care physician to follow up on this.  _______________________________________________________  If you are age 20 or older, your body mass index should be between 23-30. Your Body mass index is 20.07 kg/m. If this is out of the aforementioned range listed, please consider follow up with your Primary Care Provider.  If you are age 38 or younger, your body mass index should be between 19-25. Your Body mass index is 20.07 kg/m. If this is out of the aformentioned range listed, please consider follow up with your Primary Care Provider.   ________________________________________________________  The [facility] providers would like to encourage you to use [facility] to communicate with providers for non-urgent requests or questions.  Due to long hold times on the telephone, sending your provider a message by [facility] may be a faster and more efficient way to get a response.  Please allow 48 business hours for a response.  Please remember that this is for  non-urgent requests.  _______________________________________________________

## 2024-03-14 LAB — IBC + FERRITIN
Ferritin: 117.4 ng/mL (ref 10.0–291.0)
Iron: 102 ug/dL (ref 42–145)
Saturation Ratios: 26 % (ref 20.0–50.0)
TIBC: 392 ug/dL (ref 250.0–450.0)
Transferrin: 280 mg/dL (ref 212.0–360.0)

## 2024-03-14 LAB — VITAMIN D 25 HYDROXY (VIT D DEFICIENCY, FRACTURES): VITD: 47.82 ng/mL (ref 30.00–100.00)

## 2024-03-17 NOTE — Progress Notes (Signed)
 Addendum: Reviewed and agree with assessment and management plan. Asha Grumbine, Carie Caddy, MD

## 2024-04-18 ENCOUNTER — Telehealth: Payer: Self-pay | Admitting: *Deleted

## 2024-04-21 NOTE — Telephone Encounter (Signed)
 Per Dr. Rebecca Campus patient is cleared for her upcoming procedure. Clearance sent to be scanned.

## 2024-04-21 NOTE — Telephone Encounter (Signed)
 Faxed request to Dr. Rebecca Campus.

## 2024-04-30 ENCOUNTER — Encounter: Payer: Self-pay | Admitting: Internal Medicine

## 2024-05-08 ENCOUNTER — Encounter: Admitting: Internal Medicine

## 2024-06-30 ENCOUNTER — Encounter: Admitting: Internal Medicine
